# Patient Record
Sex: Male | Born: 1945
Health system: Southern US, Community
[De-identification: ages and names within clinical notes are randomized; demographics above are authoritative.]

## PROBLEM LIST (undated history)

## (undated) DIAGNOSIS — I82409 Acute embolism and thrombosis of unspecified deep veins of unspecified lower extremity: Secondary | ICD-10-CM

## (undated) DIAGNOSIS — I10 Essential (primary) hypertension: Secondary | ICD-10-CM

## (undated) DIAGNOSIS — E785 Hyperlipidemia, unspecified: Secondary | ICD-10-CM

## (undated) DIAGNOSIS — I509 Heart failure, unspecified: Secondary | ICD-10-CM

## (undated) DIAGNOSIS — J449 Chronic obstructive pulmonary disease, unspecified: Secondary | ICD-10-CM

## (undated) DIAGNOSIS — G4730 Sleep apnea, unspecified: Secondary | ICD-10-CM

## (undated) DIAGNOSIS — N401 Enlarged prostate with lower urinary tract symptoms: Secondary | ICD-10-CM

## (undated) DIAGNOSIS — K8020 Calculus of gallbladder without cholecystitis without obstruction: Secondary | ICD-10-CM

## (undated) DIAGNOSIS — R1011 Right upper quadrant pain: Secondary | ICD-10-CM

## (undated) DIAGNOSIS — M1990 Unspecified osteoarthritis, unspecified site: Secondary | ICD-10-CM

## (undated) DIAGNOSIS — I2699 Other pulmonary embolism without acute cor pulmonale: Secondary | ICD-10-CM

## (undated) DIAGNOSIS — E119 Type 2 diabetes mellitus without complications: Secondary | ICD-10-CM

## (undated) DIAGNOSIS — I517 Cardiomegaly: Secondary | ICD-10-CM

## (undated) DIAGNOSIS — R319 Hematuria, unspecified: Secondary | ICD-10-CM

## (undated) DIAGNOSIS — J45909 Unspecified asthma, uncomplicated: Secondary | ICD-10-CM

## (undated) HISTORY — DX: Calculus of gallbladder without cholecystitis without obstruction: K80.20

## (undated) HISTORY — DX: Right upper quadrant pain: R10.11

## (undated) HISTORY — DX: Essential (primary) hypertension: I10

## (undated) HISTORY — DX: Cardiomegaly: I51.7

## (undated) HISTORY — DX: Benign prostatic hyperplasia with lower urinary tract symptoms: N40.1

## (undated) HISTORY — DX: Chronic obstructive pulmonary disease, unspecified: J44.9

## (undated) HISTORY — PX: COLONOSCOPY: SHX174

## (undated) HISTORY — DX: Hyperlipidemia, unspecified: E78.5

## (undated) HISTORY — DX: Unspecified asthma, uncomplicated: J45.909

## (undated) HISTORY — DX: Sleep apnea, unspecified: G47.30

## (undated) HISTORY — DX: Unspecified osteoarthritis, unspecified site: M19.90

## (undated) HISTORY — DX: Hematuria, unspecified: R31.9

## (undated) HISTORY — PX: CORONARY ANGIOPLASTY WITH STENT PLACEMENT: SHX49

## (undated) HISTORY — PX: ANGIOPLASTY: SHX39

## (undated) HISTORY — PX: POLYPECTOMY: SHX149

## (undated) HISTORY — DX: Type 2 diabetes mellitus without complications: E11.9

---

## 1998-02-17 ENCOUNTER — Ambulatory Visit (HOSPITAL_COMMUNITY): Admission: RE | Admit: 1998-02-17 | Discharge: 1998-02-17 | Payer: Self-pay | Admitting: Internal Medicine

## 1998-03-17 ENCOUNTER — Encounter: Payer: Self-pay | Admitting: Internal Medicine

## 1998-03-17 ENCOUNTER — Ambulatory Visit: Admission: RE | Admit: 1998-03-17 | Discharge: 1998-03-17 | Payer: Self-pay | Admitting: Internal Medicine

## 1998-06-17 ENCOUNTER — Emergency Department (HOSPITAL_COMMUNITY): Admission: EM | Admit: 1998-06-17 | Discharge: 1998-06-17 | Payer: Self-pay | Admitting: Emergency Medicine

## 1999-05-22 ENCOUNTER — Encounter: Payer: Self-pay | Admitting: Emergency Medicine

## 1999-05-22 ENCOUNTER — Emergency Department (HOSPITAL_COMMUNITY): Admission: EM | Admit: 1999-05-22 | Discharge: 1999-05-22 | Payer: Self-pay | Admitting: Emergency Medicine

## 1999-11-25 ENCOUNTER — Encounter: Admission: RE | Admit: 1999-11-25 | Discharge: 1999-11-25 | Payer: Self-pay | Admitting: Internal Medicine

## 1999-11-25 ENCOUNTER — Encounter: Payer: Self-pay | Admitting: Internal Medicine

## 1999-12-04 ENCOUNTER — Encounter: Payer: Self-pay | Admitting: Emergency Medicine

## 1999-12-04 ENCOUNTER — Encounter: Payer: Self-pay | Admitting: Cardiovascular Disease

## 1999-12-04 ENCOUNTER — Emergency Department (HOSPITAL_COMMUNITY): Admission: EM | Admit: 1999-12-04 | Discharge: 1999-12-04 | Payer: Self-pay | Admitting: Emergency Medicine

## 2000-09-27 ENCOUNTER — Encounter: Payer: Self-pay | Admitting: Internal Medicine

## 2000-09-27 ENCOUNTER — Ambulatory Visit (HOSPITAL_COMMUNITY): Admission: RE | Admit: 2000-09-27 | Discharge: 2000-09-27 | Payer: Self-pay | Admitting: Internal Medicine

## 2001-07-11 ENCOUNTER — Encounter (INDEPENDENT_AMBULATORY_CARE_PROVIDER_SITE_OTHER): Payer: Self-pay | Admitting: Specialist

## 2001-07-11 ENCOUNTER — Ambulatory Visit (HOSPITAL_COMMUNITY): Admission: RE | Admit: 2001-07-11 | Discharge: 2001-07-11 | Payer: Self-pay | Admitting: Gastroenterology

## 2001-09-19 ENCOUNTER — Encounter: Admission: RE | Admit: 2001-09-19 | Discharge: 2001-09-19 | Payer: Self-pay | Admitting: Internal Medicine

## 2001-09-19 ENCOUNTER — Encounter: Payer: Self-pay | Admitting: Internal Medicine

## 2002-08-20 ENCOUNTER — Encounter: Admission: RE | Admit: 2002-08-20 | Discharge: 2002-08-20 | Payer: Self-pay | Admitting: Internal Medicine

## 2002-08-20 ENCOUNTER — Encounter: Payer: Self-pay | Admitting: Internal Medicine

## 2002-11-26 ENCOUNTER — Encounter: Admission: RE | Admit: 2002-11-26 | Discharge: 2003-02-24 | Payer: Self-pay | Admitting: Internal Medicine

## 2003-03-07 ENCOUNTER — Emergency Department (HOSPITAL_COMMUNITY): Admission: EM | Admit: 2003-03-07 | Discharge: 2003-03-07 | Payer: Self-pay | Admitting: Emergency Medicine

## 2003-03-29 ENCOUNTER — Ambulatory Visit (HOSPITAL_COMMUNITY): Admission: RE | Admit: 2003-03-29 | Discharge: 2003-03-29 | Payer: Self-pay | Admitting: Internal Medicine

## 2003-04-30 ENCOUNTER — Encounter: Admission: RE | Admit: 2003-04-30 | Discharge: 2003-07-29 | Payer: Self-pay | Admitting: Internal Medicine

## 2003-08-13 ENCOUNTER — Encounter: Admission: RE | Admit: 2003-08-13 | Discharge: 2003-08-13 | Payer: Self-pay | Admitting: Internal Medicine

## 2004-05-04 ENCOUNTER — Encounter: Admission: RE | Admit: 2004-05-04 | Discharge: 2004-05-04 | Payer: Self-pay | Admitting: Internal Medicine

## 2004-08-05 ENCOUNTER — Ambulatory Visit: Payer: Self-pay | Admitting: Gastroenterology

## 2004-12-25 ENCOUNTER — Ambulatory Visit (HOSPITAL_COMMUNITY): Admission: RE | Admit: 2004-12-25 | Discharge: 2004-12-25 | Payer: Self-pay | Admitting: Cardiology

## 2006-01-20 ENCOUNTER — Encounter: Admission: RE | Admit: 2006-01-20 | Discharge: 2006-01-20 | Payer: Self-pay | Admitting: Internal Medicine

## 2006-01-27 ENCOUNTER — Ambulatory Visit (HOSPITAL_COMMUNITY): Admission: RE | Admit: 2006-01-27 | Discharge: 2006-01-27 | Payer: Self-pay | Admitting: Internal Medicine

## 2007-01-19 ENCOUNTER — Encounter: Admission: RE | Admit: 2007-01-19 | Discharge: 2007-01-19 | Payer: Self-pay | Admitting: Internal Medicine

## 2008-01-22 DIAGNOSIS — I10 Essential (primary) hypertension: Secondary | ICD-10-CM | POA: Insufficient documentation

## 2008-01-22 DIAGNOSIS — J309 Allergic rhinitis, unspecified: Secondary | ICD-10-CM | POA: Insufficient documentation

## 2008-01-22 DIAGNOSIS — G4733 Obstructive sleep apnea (adult) (pediatric): Secondary | ICD-10-CM | POA: Insufficient documentation

## 2008-01-23 ENCOUNTER — Ambulatory Visit: Payer: Self-pay | Admitting: Internal Medicine

## 2008-02-04 DIAGNOSIS — E119 Type 2 diabetes mellitus without complications: Secondary | ICD-10-CM | POA: Insufficient documentation

## 2008-02-04 DIAGNOSIS — G4730 Sleep apnea, unspecified: Secondary | ICD-10-CM | POA: Insufficient documentation

## 2008-06-10 ENCOUNTER — Encounter: Admission: RE | Admit: 2008-06-10 | Discharge: 2008-06-10 | Payer: Self-pay | Admitting: Internal Medicine

## 2008-12-21 ENCOUNTER — Encounter: Admission: RE | Admit: 2008-12-21 | Discharge: 2008-12-21 | Payer: Self-pay | Admitting: Sports Medicine

## 2009-05-13 ENCOUNTER — Inpatient Hospital Stay (HOSPITAL_BASED_OUTPATIENT_CLINIC_OR_DEPARTMENT_OTHER): Admission: RE | Admit: 2009-05-13 | Discharge: 2009-05-13 | Payer: Self-pay | Admitting: Cardiology

## 2009-07-02 ENCOUNTER — Encounter (INDEPENDENT_AMBULATORY_CARE_PROVIDER_SITE_OTHER): Payer: Self-pay | Admitting: *Deleted

## 2009-08-11 ENCOUNTER — Encounter (INDEPENDENT_AMBULATORY_CARE_PROVIDER_SITE_OTHER): Payer: Self-pay | Admitting: *Deleted

## 2009-08-19 ENCOUNTER — Encounter (INDEPENDENT_AMBULATORY_CARE_PROVIDER_SITE_OTHER): Payer: Self-pay | Admitting: *Deleted

## 2009-08-22 ENCOUNTER — Ambulatory Visit: Payer: Self-pay | Admitting: Gastroenterology

## 2009-08-22 ENCOUNTER — Encounter (INDEPENDENT_AMBULATORY_CARE_PROVIDER_SITE_OTHER): Payer: Self-pay | Admitting: *Deleted

## 2009-10-06 ENCOUNTER — Telehealth (INDEPENDENT_AMBULATORY_CARE_PROVIDER_SITE_OTHER): Payer: Self-pay | Admitting: *Deleted

## 2009-10-10 ENCOUNTER — Ambulatory Visit: Payer: Self-pay | Admitting: Gastroenterology

## 2009-10-14 ENCOUNTER — Encounter: Payer: Self-pay | Admitting: Gastroenterology

## 2010-02-24 ENCOUNTER — Encounter: Admission: RE | Admit: 2010-02-24 | Discharge: 2010-02-24 | Payer: Self-pay | Admitting: Internal Medicine

## 2010-06-12 ENCOUNTER — Ambulatory Visit (HOSPITAL_COMMUNITY): Admission: RE | Admit: 2010-06-12 | Discharge: 2010-06-12 | Payer: Self-pay | Admitting: Internal Medicine

## 2010-10-06 NOTE — Miscellaneous (Signed)
Summary: LECPREVISIT/PREP  Clinical Lists Changes  Medications: Added new medication of MOVIPREP 100 GM  SOLR (PEG-KCL-NACL-NASULF-NA ASC-C) As per prep instructions. - Signed Rx of MOVIPREP 100 GM  SOLR (PEG-KCL-NACL-NASULF-NA ASC-C) As per prep instructions.;  #1 x 0;  Signed;  Entered by: Wyona Almas RN;  Authorized by: Louis Meckel MD;  Method used: [facility] [address] [phone]*, [address], [place], [place], [place]  [address], Ph: [phone] or [phone], Fax: [phone] Observations: Added new observation of NKA: T (08/22/2009 16:20)    Prescriptions: MOVIPREP 100 GM  SOLR (PEG-KCL-NACL-NASULF-NA ASC-C) As per prep instructions.  #1 x 0   Entered by:   Wyona Almas RN   Authorized by:   Louis Meckel MD   Signed by:   Wyona Almas RN on 08/22/2009   Method used:   [facility]ronically to        [facility] [address] [phone]* (retail)       [address]       [place], [place]  [address]       Ph: [phone] or [phone]       Fax: [phone]   RxID:   [id number]

## 2010-10-06 NOTE — Letter (Signed)
Summary: Patient Notice- Polyp Results  [facility]  [address] [place], [place] [address]   Phone: [phone]  Fax: [phone]        October 14, 2009 MRN: [id number]    Matthew Ochoa [address] [place], [place]  [address]    Dear [name],  I am pleased to inform you that the colon polyp(s) removed during your recent colonoscopy was (were) found to be benign (no cancer detected) upon pathologic examination.  I recommend you have a repeat colonoscopy examination in 3_ years to look for recurrent polyps, as having colon polyps increases your risk for having recurrent polyps or even colon cancer in the future.  Should you develop new or worsening symptoms of abdominal pain, bowel habit changes or bleeding from the rectum or bowels, please schedule an evaluation with either your primary care physician or with me.  Additional information/recommendations:  __ No further action with gastroenterology is needed at this time. Please      follow-up with your primary care physician for your other healthcare      needs.  __ Please call [phone] to schedule a return visit to review your      situation.  __ Please keep your follow-up visit as already scheduled.  _x_ Continue treatment plan as outlined the day of your exam.  Please call us if you are having persistent problems or have questions about your condition that have not been fully answered at this time.  Sincerely,  Louis Meckel MD  This letter has been electronically signed by your physician.  Appended Document: Patient Notice- Polyp Results letter mailed 2.11.11

## 2010-10-06 NOTE — Letter (Signed)
Summary: Previsit letter  [facility]  [address] [place], [place] [address]   Phone: [phone]  Fax: [phone]       08/11/2009 MRN: [id number]  Matthew Ochoa [address] [place], [place]  [address]  Dear [name],  Welcome to the Gastroenterology Division at [facility].    You are scheduled to see a nurse for your pre-procedure visit on 08/21/2009 at 4:00PM on the 3rd floor at [facility], [address]  We ask that you try to arrive at our office 15 minutes prior to your appointment time to allow for check-in.  Your nurse visit will consist of discussing your medical and surgical history, your immediate family medical history, and your medications.    Please bring a complete list of all your medications or, if you prefer, bring the medication bottles and we will list them.  We will need to be aware of both prescribed and over the counter drugs.  We will need to know exact dosage information as well.  If you are on blood thinners (Coumadin, Plavix, Aggrenox, Ticlid, etc.) please call our office today/prior to your appointment, as we need to consult with your physician about holding your medication.   Please be prepared to read and sign documents such as consent forms, a financial agreement, and acknowledgement forms.  If necessary, and with your consent, a friend or relative is welcome to sit-in on the nurse visit with you.  Please bring your insurance card so that we may make a copy of it.  If your insurance requires a referral to see a specialist, please bring your referral form from your primary care physician.  No co-pay is required for this nurse visit.     If you cannot keep your appointment, please call [phone] to cancel or reschedule prior to your appointment date.  This allows [place] the opportunity to schedule an appointment for another patient in need of care.    Thank you for choosing [facility] for your medical  needs.  We appreciate the opportunity to care for you.  Please visit [place] at our website  to learn more about our practice.                     Sincerely.                                                                                                                   The Gastroenterology Division

## 2010-10-06 NOTE — Letter (Signed)
Summary: [facility] Instructions  [facility]  [address] [place], [place] [address]   Phone: [phone]  Fax: [phone]       Matthew Ochoa    [birth year]    MRN: [id number]        Procedure Day Dorna Bloom: Sheral Flow. 09/15/09     Arrival Time: 7:30am     Procedure Time: 8:30am     Location of Procedure:                    Juliann Pares  [facility] (4th Floor)                       PREPARATION FOR COLONOSCOPY WITH MOVIPREP   Starting 5 days prior to your procedure 09/10/09  do not eat nuts, seeds, popcorn, corn, beans, peas,  salads, or any raw vegetables.  Do not take any fiber supplements (e.g. Metamucil, Citrucel, and Benefiber).  THE DAY BEFORE YOUR PROCEDURE         DATE: 09/14/09   DAY: Sunday  1.  Drink clear liquids the entire day-NO SOLID FOOD  2.  Do not drink anything colored red or purple.  Avoid juices with pulp.  No orange juice.  3.  Drink at least 64 oz. (8 glasses) of fluid/clear liquids during the day to prevent dehydration and help the prep work efficiently.  CLEAR LIQUIDS INCLUDE: Water Jello Ice Popsicles Tea (sugar ok, no milk/cream) Powdered fruit flavored drinks Coffee (sugar ok, no milk/cream) Gatorade Juice: apple, white grape, white cranberry  Lemonade Clear bullion, consomm, broth Carbonated beverages (any kind) Strained chicken noodle soup Hard Candy                             4.  In the morning, mix first dose of MoviPrep solution:    Empty 1 Pouch A and 1 Pouch B into the disposable container    Add lukewarm drinking water to the top line of the container. Mix to dissolve    Refrigerate (mixed solution should be used within 24 hrs)  5.  Begin drinking the prep at 5:00 p.m. The MoviPrep container is divided by 4 marks.   Every 15 minutes drink the solution down to the next mark (approximately 8 oz) until the full liter is complete.   6.  Follow completed prep with 16 oz of clear liquid of your choice (Nothing red  or purple).  Continue to drink clear liquids until bedtime.  7.  Before going to bed, mix second dose of MoviPrep solution:    Empty 1 Pouch A and 1 Pouch B into the disposable container    Add lukewarm drinking water to the top line of the container. Mix to dissolve    Refrigerate  THE DAY OF YOUR PROCEDURE      DATE: 09/15/09  DAY: Monday  Beginning at 3:30 a.m. (5 hours before procedure):         1. Every 15 minutes, drink the solution down to the next mark (approx 8 oz) until the full liter is complete.  2. Follow completed prep with 16 oz. of clear liquid of your choice.    3. You may drink clear liquids until 6:30AM  (2 HOURS BEFORE PROCEDURE).   MEDICATION INSTRUCTIONS  Unless otherwise instructed, you should take regular prescription medications with a small sip of water   as early as possible the morning of  your procedure.  Diabetic patients - see separate instructions.   Additional medication instructions: Hold Micardis and Metformin the morning of procedure.  Take Atenolol and Felodipine the morning of procedure.        OTHER INSTRUCTIONS  You will need a responsible adult at least 64 years of age to accompany you and drive you home.   This person must remain in the waiting room during your procedure.  Wear loose fitting clothing that is easily removed.  Leave jewelry and other valuables at home.  However, you may wish to bring a book to read or  an iPod/MP3 player to listen to music as you wait for your procedure to start.  Remove all body piercing jewelry and leave at home.  Total time from sign-in until discharge is approximately 2-3 hours.  You should go home directly after your procedure and rest.  You can resume normal activities the  day after your procedure.  The day of your procedure you should not:   Drive   Make legal decisions   Operate machinery   Drink alcohol   Return to work  You will receive specific instructions about  eating, activities and medications before you leave.    The above instructions have been reviewed and explained to me by   Wyona Almas RN  December 64, 2010 4:51 PM     I fully understand and can verbalize these instructions _____________________________ Date _________

## 2010-10-06 NOTE — Procedures (Signed)
Summary: Colonoscopy  Patient: Matthew Ochoa Note: All result statuses are Final unless otherwise noted.  Tests: (1) Colonoscopy (COL)   COL Colonoscopy           DONE     [facility]     [address]     [place], [place]  [address]           COLONOSCOPY PROCEDURE REPORT           PATIENT:  Matthew Ochoa, Matthew Ochoa  MR#:  [id number]     BIRTHDATE:  1946/07/31, 63 yrs. old  GENDER:  male           ENDOSCOPIST:  Barbette Hair. Arlyce Dice, MD     Referred by:           PROCEDURE DATE:  10/10/2009     PROCEDURE:  Colonoscopy with snare polypectomy     ASA CLASS:  Class II     INDICATIONS:  history of pre-cancerous (adenomatous) colon polyps                 MEDICATIONS:   Fentanyl 50 mcg IV, Versed 5 mg IV           DESCRIPTION OF PROCEDURE:   After the risks benefits and     alternatives of the procedure were thoroughly explained, informed     consent was obtained.  Digital rectal exam was performed and     revealed no abnormalities.   The LB CF-[id number] endoscope     was introduced through the anus and advanced to the cecum, which     was identified by the ileocecal valve, limited by poor     preparation.  Moderate amount of retained, thick liquid stool  The     quality of the prep was Moviprep fair.  The instrument was then     slowly withdrawn as the colon was fully examined.     <<PROCEDUREIMAGES>>           FINDINGS:  A sessile polyp was found in the distal transverse     colon. It was 1 cm in size. Polyp was snared, then cauterized with     monopolar cautery. Retrieval was successful (see image10). snare     polyp  A sessile polyp was found at the splenic flexure. It was 5     mm in size. Polyp was snared without cautery. Retrieval was     successful (see image12). snare polyp  A sessile polyp was found     in the sigmoid colon. It was 4 mm in size. It was found 30 cm from     the point of entry. Polyp was snared without cautery. Retrieval     was successful (see  image16). snare polyp  Mild diverticulosis was     found transverse to sigmoid Scattered diverticula  This was     otherwise a normal examination of the colon (see image2, image3,     image5, image7, image17, image18, and image19).   Retroflexed     views in the rectum revealed no abnormalities.    The scope was     then withdrawn from the patient and the procedure completed.           COMPLICATIONS:  None           ENDOSCOPIC IMPRESSION:     1) 1 cm sessile polyp in the distal transverse colon     2) 5 mm sessile polyp at the splenic  flexure     3) 4 mm sessile polyp in the sigmoid colon     4) Mild diverticulosis in the transverse to sigmoid     5) Otherwise normal examination     RECOMMENDATIONS:     1) colonoscopy in 3 years (because of limitations from prep,     small polyp may have not been seen)           REPEAT EXAM:  In 3 year(s) for Colonoscopy.           ______________________________     Barbette Hair. Arlyce Dice, MD           CC:  Willey Blade, MD           n.     Rosalie Doctor:   Barbette Hair. Kaplan at 10/10/2009 09:47 AM           Page 2 of 3   Torrell, Krutz, [id number]  Note: An exclamation mark (!) indicates a result that was not dispersed into the flowsheet. Document Creation Date: 10/10/2009 9:47 AM _______________________________________________________________________  (1) Order result status: Final Collection or observation date-time: 10/10/2009 09:31 Requested date-time:  Receipt date-time:  Reported date-time:  Referring Physician:   Ordering Physician: Melvia Heaps [phone]) Specimen Source:  Source: Launa Grill Order Number: [id number] Lab site:   Appended Document: Colonoscopy f/u     Procedures Next Due Date:    Colonoscopy: 10/2012

## 2010-10-06 NOTE — Progress Notes (Signed)
Summary: prep ?  Phone Note Call from Patient Call back at [phone]  (cell)   Caller: Patient Call For: Dr. Arlyce Dice Reason for Call: Talk to Nurse Summary of Call: pt has questions regarding his diet before procedure this Friday Initial call taken by: Vallarie Mare,  October 06, 2009 10:56 AM  Follow-up for Phone Call        pt's questions were answered Follow-up by: Ezra Sites RN,  October 06, 2009 11:44 AM

## 2010-10-06 NOTE — Letter (Signed)
Summary: Diabetic Instructions  [facility]  [address] [place], [place] [address]   Phone: [phone]  Fax: [phone]    LAITHAN CONCHAS 04-May-1946 MRN: [id number]   _x  _   ORAL DIABETIC MEDICATION INSTRUCTIONS  The day before your procedure:   Take your diabetic pill as you do normally  The day of your procedure:   Do not take your diabetic pill    We will check your blood sugar levels during the admission process and again in Recovery before discharging you home  ________________________________________________________________________  _  _   INSULIN (LONG ACTING) MEDICATION INSTRUCTIONS (Lantus, NPH, 70/30, Humulin, Novolin-N)   The day before your procedure:   Take  your regular evening dose    The day of your procedure:   Do not take your morning dose    _  _   INSULIN (SHORT ACTING) MEDICATION INSTRUCTIONS (Regular, Humulog, Novolog)   The day before your procedure:   Do not take your evening dose   The day of your procedure:   Do not take your morning dose   _  _   INSULIN PUMP MEDICATION INSTRUCTIONS  We will contact the physician managing your diabetic care for written dosage instructions for the day before your procedure and the day of your procedure.  Once we have received the instructions, we will contact you.

## 2010-10-06 NOTE — Assessment & Plan Note (Signed)
Summary: sleep apnea-self referral////kp   Visit Type:  Initial Consult PCP:  Violeta Gelinas  Chief Complaint:  Sleep Medicine consult.  History of Present Illness: Current Problems:  ALLERGIC RHINITIS (ICD-477.9) OBSTRUCTIVE SLEEP APNEA (ICD-327.23) HYPERTENSION (ICD-401.9)  This is a 65 year old man referred in sleep medicine consultation at the kind request of Dr. [name] because of sleep apnea.  He had a polysomnogram on 03/17/98 with an AHI of 32/hr.  He had been using CPAP at [address], but couldn't get comfortable with the mask.  He stopped CPAP.  More recently, he had been trying it again.  His wife complains of his snoring.  He admits to some daytime sleepiness, and has to get up and move around, occasionally to stay alert.  He asks about alternative therapies. Bedtime is between 10 and 11 p.m., estimating sleep latency of 15 minutes.  He wakes up once or twice each night after sleep onset before getting up at 5:30 a.m.  He has gained 20 pounds in the last two years.         Current Allergies (reviewed today): No known allergies   Past Medical History:    Reviewed history and no changes required:       Sleep Apnea- NPSG 03/17/98, cpap 14       Diabetes, Type 2       Hypertension   Family History:    Reviewed history and no changes required:       Sister- asthma       Father- died lung cancer  Social History:    Reviewed history and no changes required:       Patient is a current smoker.    Risk Factors:  Tobacco use:  current   Review of Systems      See HPI       snoring, witnessed apneas, daytime sleepiness, nonrestorative sleep.   Vital Signs:  Patient Profile:   65 Years Old Male Weight:      282.13 pounds O2 Sat:      98 % O2 treatment:    Room Air Pulse rate:   55 / minute BP sitting:   142 / 78  (left arm) Cuff size:   regular  Vitals Entered By: Reynaldo Minium CMA (Jan 23, 2008 4:30 PM)             Comments Medications reviewed with patient  Reynaldo Minium CMA  Jan 23, 2008 4:30 PM      Physical Exam  General:     obese. alert and cooperative  Ears:     TMs intact and clear with normal canals Nose:     no deformity, discharge, inflammation, or lesions Mouth:     Melampatti Class III.   voice normal, with no stridor Chest Wall:     pectus excavatum.   Lungs:     clear bilaterally to auscultation and percussion Heart:     regular rhythm, normal rate, and no murmurs.   Extremities:     no clubbing, cyanosis, edema, or deformity noted Neurologic:     grossly normal to observation     Impression & Recommendations:  Problem # 1:  OBSTRUCTIVE SLEEP APNEA (ICD-327.23) I don't believe he is going to be comfortable with CPAP long-term.  We can work on desensitization and we can restudy him if necessary.  For now I would like to give him information about alternative therapies.  We will get him a dental referral for an oral appliance, and  ENT referral to learn about surgery. Christoper Allegra is being asked to work with him again on mask fit and comfort. Orders:  Dental Referral (Dentist) DME Referral (DME) ENT Referral (ENT)   Medications Added to Medication List This Visit: 1)  Actoplus Met 15-500 Mg Tabs (Pioglitazone hcl-metformin hcl) .... Take 1 by mouth once daily 2)  Felodipine 5 Mg Tb24 (Felodipine) .... Take 1 by mouth once daily 3)  Micardis Hct 80-12.5 Mg Tabs (Telmisartan-hctz) .... Take 1 by mouth once daily 4)  Adult Aspirin Low Strength 81 Mg Tbdp (Aspirin) .... Take 1 by mouth once daily 5)  Cpap  .... Unsure of setting apria   Patient Instructions: 1)  Please schedule a follow-up appointment as needed 2)  [facility] will give ENT apt and contact information for dental faculty  about oral appliances   ]

## 2010-11-26 LAB — GLUCOSE, CAPILLARY
Glucose-Capillary: 102 mg/dL — ABNORMAL HIGH (ref 70–99)
Glucose-Capillary: 99 mg/dL (ref 70–99)

## 2010-12-11 LAB — POCT I-STAT GLUCOSE
Glucose, Bld: 102 mg/dL — ABNORMAL HIGH (ref 70–99)
Operator id: 133881

## 2010-12-11 LAB — CBC
HCT: 39.7 % (ref 39.0–52.0)
Hemoglobin: 13.6 g/dL (ref 13.0–17.0)
MCHC: 34.2 g/dL (ref 30.0–36.0)
MCV: 86.7 fL (ref 78.0–100.0)
Platelets: 152 10*3/uL (ref 150–400)
RBC: 4.58 MIL/uL (ref 4.22–5.81)
RDW: 16.4 % — ABNORMAL HIGH (ref 11.5–15.5)
WBC: 6.7 10*3/uL (ref 4.0–10.5)

## 2011-01-03 ENCOUNTER — Inpatient Hospital Stay (INDEPENDENT_AMBULATORY_CARE_PROVIDER_SITE_OTHER)
Admission: RE | Admit: 2011-01-03 | Discharge: 2011-01-03 | Disposition: A | Discharge status: Home / Self Care | Payer: 59 | Source: Ambulatory Visit | Attending: Family Medicine | Admitting: Family Medicine

## 2011-01-03 DIAGNOSIS — S335XXA Sprain of ligaments of lumbar spine, initial encounter: Secondary | ICD-10-CM

## 2011-01-03 LAB — POCT URINALYSIS DIP (DEVICE)
Bilirubin Urine: NEGATIVE
Glucose, UA: NEGATIVE mg/dL
Hgb urine dipstick: NEGATIVE
Ketones, ur: NEGATIVE mg/dL
Nitrite: NEGATIVE
Protein, ur: NEGATIVE mg/dL
Specific Gravity, Urine: 1.01 (ref 1.005–1.030)
Urobilinogen, UA: 1 mg/dL (ref 0.0–1.0)
pH: 6 (ref 5.0–8.0)

## 2011-04-13 ENCOUNTER — Inpatient Hospital Stay (HOSPITAL_COMMUNITY)
Admission: AD | Admit: 2011-04-13 | Discharge: 2011-04-14 | DRG: 247 | Disposition: A | Discharge status: Home / Self Care | Payer: 59 | Source: Ambulatory Visit | Attending: Cardiology | Admitting: Cardiology

## 2011-04-13 ENCOUNTER — Inpatient Hospital Stay (HOSPITAL_BASED_OUTPATIENT_CLINIC_OR_DEPARTMENT_OTHER)
Admission: RE | Admit: 2011-04-13 | Discharge: 2011-04-13 | Disposition: A | Discharge status: Home / Self Care | Payer: 59 | Source: Ambulatory Visit | Attending: Cardiology | Admitting: Cardiology

## 2011-04-13 DIAGNOSIS — R0989 Other specified symptoms and signs involving the circulatory and respiratory systems: Secondary | ICD-10-CM | POA: Insufficient documentation

## 2011-04-13 DIAGNOSIS — E119 Type 2 diabetes mellitus without complications: Secondary | ICD-10-CM | POA: Insufficient documentation

## 2011-04-13 DIAGNOSIS — E7800 Pure hypercholesterolemia, unspecified: Secondary | ICD-10-CM | POA: Diagnosis present

## 2011-04-13 DIAGNOSIS — F17200 Nicotine dependence, unspecified, uncomplicated: Secondary | ICD-10-CM | POA: Diagnosis present

## 2011-04-13 DIAGNOSIS — I10 Essential (primary) hypertension: Secondary | ICD-10-CM | POA: Diagnosis present

## 2011-04-13 DIAGNOSIS — R0609 Other forms of dyspnea: Secondary | ICD-10-CM | POA: Insufficient documentation

## 2011-04-13 DIAGNOSIS — I2510 Atherosclerotic heart disease of native coronary artery without angina pectoris: Principal | ICD-10-CM | POA: Diagnosis present

## 2011-04-13 DIAGNOSIS — R42 Dizziness and giddiness: Secondary | ICD-10-CM | POA: Insufficient documentation

## 2011-04-13 LAB — GLUCOSE, CAPILLARY
Glucose-Capillary: 119 mg/dL — ABNORMAL HIGH (ref 70–99)
Glucose-Capillary: 98 mg/dL (ref 70–99)
Glucose-Capillary: 153 mg/dL — ABNORMAL HIGH (ref 70–99)
Glucose-Capillary: 152 mg/dL — ABNORMAL HIGH (ref 70–99)

## 2011-04-13 LAB — POCT I-STAT GLUCOSE
Glucose, Bld: 135 mg/dL — ABNORMAL HIGH (ref 70–99)
Operator id: 141321

## 2011-04-13 LAB — POCT ACTIVATED CLOTTING TIME: Activated Clotting Time: 403 seconds

## 2011-04-13 LAB — PLATELET INHIBITION P2Y12: Platelet Function  P2Y12: 10 [PRU] — ABNORMAL LOW (ref 194–418)

## 2011-04-14 LAB — BASIC METABOLIC PANEL
BUN: 11 mg/dL (ref 6–23)
CO2: 25 mEq/L (ref 19–32)
Calcium: 8.8 mg/dL (ref 8.4–10.5)
Chloride: 106 mEq/L (ref 96–112)
Creatinine, Ser: 0.85 mg/dL (ref 0.50–1.35)
GFR calc Af Amer: >60 mL/min (ref >60)
GFR calc non Af Amer: >60 mL/min (ref >60)
Glucose, Bld: 117 mg/dL — ABNORMAL HIGH (ref 70–99)
Potassium: 3.8 mEq/L (ref 3.5–5.1)
Sodium: 138 mEq/L (ref 135–145)

## 2011-04-14 LAB — CBC
HCT: 36.4 % — ABNORMAL LOW (ref 39.0–52.0)
Hemoglobin: 13.3 g/dL (ref 13.0–17.0)
MCH: 28.2 pg (ref 26.0–34.0)
MCHC: 36.5 g/dL — ABNORMAL HIGH (ref 30.0–36.0)
MCV: 77.1 fL — ABNORMAL LOW (ref 78.0–100.0)
Platelets: 144 10*3/uL — ABNORMAL LOW (ref 150–400)
RBC: 4.72 MIL/uL (ref 4.22–5.81)
RDW: 15.9 % — ABNORMAL HIGH (ref 11.5–15.5)
WBC: 8.5 10*3/uL (ref 4.0–10.5)

## 2011-04-14 LAB — CK TOTAL AND CKMB (NOT AT ARMC)
CK, MB: 2.6 ng/mL (ref 0.3–4.0)
Relative Index: 2.4 (ref 0.0–2.5)
Total CK: 109 U/L (ref 7–232)

## 2011-04-14 LAB — GLUCOSE, CAPILLARY
Glucose-Capillary: 159 mg/dL — ABNORMAL HIGH (ref 70–99)
Glucose-Capillary: 137 mg/dL — ABNORMAL HIGH (ref 70–99)

## 2011-04-22 NOTE — Cardiovascular Report (Signed)
NAMETARIG, ZIMMERS               ACCOUNT NO.:  [id number]  MEDICAL RECORD NO.:  [id number]  LOCATION:  6533                         FACILITY:  [facility]  PHYSICIAN:  Eduardo Osier. Sharyn Lull, M.D. DATE OF BIRTH:  [birth year]  DATE OF PROCEDURE:  04/13/2011 DATE OF DISCHARGE:                           CARDIAC CATHETERIZATION   PROCEDURE:  Left cardiac cath with selective left and right coronary angiography, LV graft via right groin using Judkins technique.  INDICATIONS FOR PROCEDURE:  Mr. Stettler is a 65 year old black male with past medical history significant for hypertension, non-insulin-dependent diabetes mellitus, hypercholesteremia, history of atrial tachy arrhythmias, morbid obesity who complains of exertional dyspnea with minimal exertion associated with feeling weak and dizzy.  States he felt very weak and diaphoretic while playing golf.  Denies any chest pain, nausea, vomiting, states lately he gets tired and short of breath easily.  EKG done in the office showed normal sinus rhythm with ST depression in anterolateral leads.  Denies any palpitation, lightheadedness or syncope.  Denies PND, orthopnea or leg swelling. Denies fever, chills or cough.  Due to exertional dyspnea, feeling weak. Multiple risk factors and EKG changes discussed with the patient regarding noninvasive stress testing versus left cath, possible PTCA stenting, its risks and benefits i.e. death, stroke, need for emergency CABG, local vascular complications etc., and consented for PCI.  PROCEDURE:  After obtaining the informed consent, the patient was brought to the cath lab and was placed on fluoroscopy table.  Right groin was prepped and draped in usual fashion.  Xylocaine 2% was used for local anesthesia in the right groin.  With the help of thin-wall needle, 4-French arterial sheath was placed.  The sheath was aspirated and flushed.  Next, 4-French left Judkins catheter was advanced over the wire under  fluoroscopic guidance up to the ascending aorta.  Wire was pulled out.  The catheter was aspirated and connected to the manifold. Catheter was further advanced and engaged into left coronary ostium. Multiple views of the left system were taken.  Next, the catheter was disengaged and was pulled out over the wire and was replaced with 4- [place] 3-D right diagnostic catheter which was advanced over the wire under fluoroscopic guidance up to the ascending aorta.  Wire was pulled out.  The catheter was aspirated and connected to the manifold. Catheter was further advanced and engaged into right coronary ostium. Multiple views of the right system were taken.  Next, the catheter was disengaged and was pulled out over the wire and was replaced with 4- [place] pigtail catheter which was advanced over the wire under fluoroscopic guidance up to the ascending aorta.  Wire was pulled out. The catheter was aspirated and connected to the manifold.  Catheter was further advanced across the aortic valve into the LV.  LV pressures were recorded.  Next, LV graft was done in 30 degrees RAO position.  Post angiographic pressures were recorded from LV and then pullback pressures were recorded from the aorta.  There was no significant gradient across the aortic valve.  Next, pigtail catheter was pulled out over the wire. Sheaths were aspirated and flushed.  FINDINGS:  LV showed LV was mildly enlarged with inferior wall  hypokinesia, EF of 50-55%.  Left main was patent.  LAD has 20-25% ostial and 20-25% proximal and mid stenosis.  Diagonal one is small which is patent.  Left circumflex has 15-20% ostial and mid stenosis and then it tapers down in AV groove.  OM-1 is very, very small.  OM-2 is very small which is patent.  Left PLV branch is large which has 30-40% proximal stenosis.  RCA has 15-20% proximal and mid stenosis and 95% distal stenosis and poststenotic dilatation just prior to PDA.  PDA is very small  which is diffusely diseased and has mid 80-85% stenosis.  PLV branches were also very small which were diffusely diseased.  The patient tolerated the procedure well.  The patient will be transferred. The patient will be admitted and will undergo PCI to distal RCA and PDA today.     Eduardo Osier. Sharyn Lull, M.D.     [facility]/MEDQ  D:  04/13/2011  T:  04/13/2011  Job:  [id number]  Electronically Signed by Rinaldo Cloud M.D. on 04/22/2011 08:14:50 PM

## 2011-04-22 NOTE — Discharge Summary (Signed)
NAMEGRAYLAND, Matthew Ochoa               ACCOUNT NO.:  [id number]  MEDICAL RECORD NO.:  [id number]  LOCATION:  6533                         FACILITY:  [facility]  PHYSICIAN:  Eduardo Osier. Sharyn Lull, M.D. DATE OF BIRTH:  1946-02-03  DATE OF ADMISSION:  04/13/2011 DATE OF DISCHARGE:  04/14/2011                              DISCHARGE SUMMARY   ADMITTING DIAGNOSES:  Exertional dyspnea, weakness with abnormal EKG, rule out coronary insufficiency, hypertension, non-insulin-dependent diabetes mellitus, hypercholesteremia, morbid obesity and history of atrial tachy arrhythmias.  FINAL DIAGNOSES:  Exertional dyspnea, weakness, status post left cath and percutaneous transluminal coronary angioplasty stenting to distal right coronary artery and percutaneous transluminal coronary angioplasty to very small patent ductus arteriosus, hypertension, non-insulin- dependent diabetes mellitus, hypercholesteremia, morbid obesity and history of atrial tachyarrhythmias.  DISCHARGE HOME MEDICATIONS: 1. Enteric-coated aspirin 325 mg 1 tablet daily. 2. Effient 10 mg 1 tablet daily. 3. Pepcid 20 mg 1 tablet twice daily. 4. Nitrostat 0.4 mg sublingual use as directed. 5. Crestor 10 mg 1 tablet daily. 6. Toprol-XL 25 mg 1 tablet daily. 7. Felodipine 5 mg 1 tablet daily. 8. Metformin 500 mg 1 tablet twice daily as before.  The patient has     been advised to stop metformin for 48 hours, start after 48 hours. 9. Micardis/HCT 80/12.5 one tablet daily.  DIET:  Low salt, low cholesterol 1800 calories ADA diet.  The patient has been advised to monitor blood pressure and blood sugar daily.  Post PTCA stent instructions have been given.  FOLLOWUP:  With me in 1 week.  CONDITION AT DISCHARGE:  Stable.  BRIEF HISTORY AND HOSPITAL COURSE:  Matthew Ochoa is a 65 year old black male with past medical history significant for hypertension, diabetes mellitus, hypercholesteremia, history of atrial tachyarrhythmias, morbid obesity  complains of exertional dyspnea with minimal exertion associated with feeling weak, dizzy, states he felt very weak and diaphoretic while playing golf.  Denies chest pain, nausea, vomiting or diaphoresis.  He states lately he gets tired and short of breath easily.  EKG done in the office showed normal sinus rhythm with ST depression in anterolateral leads.  Denies any palpitation, lightheadedness or syncope.  Denies PND, orthopnea and leg swelling.  Denies fever, chills and cough.  PAST MEDICAL HISTORY:  As above.  PAST SURGICAL HISTORY:  None.  ALLERGIES:  None.  MEDICATION AT HOME:  He is on aspirin, Plavix, Micardis, atenolol, metformin, vitamin D and omega-3 fatty acid.  SOCIAL HISTORY:  He is married, 2 children.  He smoked one-pack per day for 25 years, quit 5+ years ago.  No history of alcohol abuse.  FAMILY HISTORY:  Father died of CA of lung at age of 19.  Mother is alive.  She is 82.  She has COPD.  One sister had breast CA.  PHYSICAL EXAMINATION:  GENERAL:  He was alert, awake and oriented x3, in no acute distress.VITAL SIGNS:  Blood pressure was 140/84, pulse was 74 and regular. EYES:  Conjunctivae was pink. NECK:  Supple, no JVD, no bruit. LUNGS:  Were clear to auscultation without rhonchi or rales. CARDIOVASCULAR:  S1 and S2 was normal.  There was soft systolic murmur. ABDOMEN:  Soft, obese, bowel sounds  present and nontender. EXTREMITIES:  There is no clubbing, cyanosis or edema.  LABORATORY DATA POSTPROCEDURE:  Her CK is 109, MB 2.6.  Her BRU platelet inhibition level is 10, hemoglobin is 13.3, hematocrit 36.4, white count of 8.5, potassium is 3.8, glucose 117, BUN 11 and creatinine 0.85.  EKG showed normal sinus rhythm with marked sinus arrhythmias and PACs.  He has T-wave inversion in anterolateral and inferior leads.  BRIEF HOSPITAL COURSE:  The patient was a.m. admit and underwent outpatient cardiac cath with selective left and right coronary angiography  and was noted to have critical distal RCA and PDA stenosis requiring admission to the hospital and PTCA stenting to distal RCA and PTCA to PDA.  The patient tolerated the procedure well.  There are no complications postprocedure.  The patient did not have any episodes of chest pain during the hospital stay.  His groin is stable with no evidence of hematoma or bruit.  The patient has been ambulating in hallway without any problems and is eager to go home.  The patient will be discharged home on above medications and will be followed up in my office in 1 week.     Eduardo Osier. Sharyn Lull, M.D.     [facility]/MED[facility]  D:  04/14/2011  T:  04/15/2011  Job:  [id number]  Electronically Signed by Rinaldo Cloud M.D. on 04/22/2011 08:14:44 PM

## 2011-04-22 NOTE — Cardiovascular Report (Signed)
NAMEDARIUSH, MCNELLIS               ACCOUNT NO.:  [id number]  MEDICAL RECORD NO.:  [id number]  LOCATION:  6533                         FACILITY:  [facility]  PHYSICIAN:  Eduardo Osier. Sharyn Lull, M.D. DATE OF BIRTH:  February 14, 1946  DATE OF PROCEDURE:  04/13/2011 DATE OF DISCHARGE:                           CARDIAC CATHETERIZATION   PROCEDURE: 1. Successful percutaneous transluminal coronary angioplasty to distal     right coronary artery using 2.0 x 12-mm long Mini Trek balloon. 2. Successful percutaneous transluminal coronary angioplasty to     posterior descending artery using initially 1.5 x 15-mm long     Sprinter balloon and then 2.0 x 15-mm long Sprinter balloon. 3. Successful deployment of 3.0 x 32-mm long thrombosed drug-eluting     stent in distal right coronary artery. 4. Successful postdilatation of this stent using 3.25 x 15-mm long [place]     Sprinter balloon.  INDICATIONS FOR PROCEDURE:  Mr. Fox is a 65 year old black male with past medical history significant for hypertension, diabetes mellitus, hypercholesteremia, history of tracheal arrhythmias, morbid obesity, complains of exertional dyspnea with minimal exertion associated with feeling weak and dizzy while playing golf.  States feelings of lately tired and weak and diaphoretic with exertion.  EKG done in the office showed normal sinus rhythm with ST depression in anterolateral leads. The patient subsequently underwent left cardiac cath as an outpatient today which showed critical distal RCA and PDA stenosis.  The patient is transferred to [facility] Cath Lab for PCI to RCA.  After obtaining the informed consent, the patient was brought to the cath lab and was placed on fluoroscopy table.  A 4-French arterial sheath was exchanged to 6-French arterial sheath without difficulty.  Next, a 6- [place] IM guiding catheter was advanced over the wire under fluoroscopic guidance up to the ascending aorta.  Wire was pulled  out, the catheter was aspirated and connected to the manifold.  Catheter was further advanced and engaged into right coronary ostium.  Multiple views of the right system were taken.  Findings were same as before.  The patient had critical 90-95% stenosis in the distal RCA and 80-85% stenosis in mid PDA.  INTERVENTIONAL PROCEDURE:  Successful PTCA to distal RCA was done using 2.0 x 12-mm long Mini Trek balloon for predilatation and then PTCA to PDA was done using initially 1.5 x 15-mm long Sprinter balloon, and then 2.0 x 15-mm long Sprinter balloon.  Lesion was dilated in PDA from 80- 85% to less than 20% residual with excellent TIMI grade 3 distal flow without evidence of dissection or distal embolization, and then 3.0 x 32- mm long PROMUS Element drug-eluting stent was deployed in distal RCA at 11 atmospheric pressure.  Stent was postdilated using 3.25 x 15-mm long [place] Sprinter balloon going up to 18 atmospheric pressure.  Lesion was dilated from 95% with aneurysmal dilatation to 0% residual with excellent TIMI grade 3 distal flow without evidence of dissection or distal embolization.  The patient received weight-based Angiomax and 60 mg of Effient prior to the procedure.  The patient tolerated the procedure well.  There were no complications.  The patient was transferred to recovery room in stable condition.  Eduardo Osier. Sharyn Lull, M.D.     [facility]/MEDQ  D:  04/13/2011  T:  04/13/2011  Job:  [id number]  Electronically Signed by Rinaldo Cloud M.D. on 04/22/2011 08:14:55 PM

## 2011-05-24 ENCOUNTER — Other Ambulatory Visit: Payer: Self-pay | Admitting: Cardiology

## 2011-05-24 ENCOUNTER — Encounter (HOSPITAL_COMMUNITY)
Admission: RE | Admit: 2011-05-24 | Discharge: 2011-05-24 | Disposition: A | Discharge status: Home / Self Care | Payer: 59 | Source: Ambulatory Visit | Attending: Cardiology | Admitting: Cardiology

## 2011-05-24 DIAGNOSIS — E119 Type 2 diabetes mellitus without complications: Secondary | ICD-10-CM | POA: Insufficient documentation

## 2011-05-24 DIAGNOSIS — E7800 Pure hypercholesterolemia, unspecified: Secondary | ICD-10-CM | POA: Insufficient documentation

## 2011-05-24 DIAGNOSIS — I10 Essential (primary) hypertension: Secondary | ICD-10-CM | POA: Insufficient documentation

## 2011-05-24 DIAGNOSIS — Z5189 Encounter for other specified aftercare: Secondary | ICD-10-CM | POA: Insufficient documentation

## 2011-05-24 DIAGNOSIS — Z9861 Coronary angioplasty status: Secondary | ICD-10-CM | POA: Insufficient documentation

## 2011-05-24 DIAGNOSIS — F17200 Nicotine dependence, unspecified, uncomplicated: Secondary | ICD-10-CM | POA: Insufficient documentation

## 2011-05-24 DIAGNOSIS — I2510 Atherosclerotic heart disease of native coronary artery without angina pectoris: Secondary | ICD-10-CM | POA: Insufficient documentation

## 2011-05-24 LAB — GLUCOSE, CAPILLARY
Glucose-Capillary: 199 mg/dL — ABNORMAL HIGH (ref 70–99)
Glucose-Capillary: 134 mg/dL — ABNORMAL HIGH (ref 70–99)

## 2011-05-26 ENCOUNTER — Other Ambulatory Visit: Payer: Self-pay | Admitting: Cardiology

## 2011-05-26 ENCOUNTER — Encounter (HOSPITAL_COMMUNITY): Payer: 59

## 2011-05-26 LAB — GLUCOSE, CAPILLARY
Glucose-Capillary: 167 mg/dL — ABNORMAL HIGH (ref 70–99)
Glucose-Capillary: 142 mg/dL — ABNORMAL HIGH (ref 70–99)

## 2011-05-28 ENCOUNTER — Encounter (HOSPITAL_COMMUNITY): Payer: 59

## 2011-05-31 ENCOUNTER — Encounter (HOSPITAL_COMMUNITY): Payer: 59

## 2011-06-02 ENCOUNTER — Encounter (HOSPITAL_COMMUNITY): Payer: 59

## 2011-06-04 ENCOUNTER — Encounter (HOSPITAL_COMMUNITY): Payer: 59

## 2011-06-07 ENCOUNTER — Encounter (HOSPITAL_COMMUNITY): Payer: 59 | Attending: Cardiology

## 2011-06-07 DIAGNOSIS — F17200 Nicotine dependence, unspecified, uncomplicated: Secondary | ICD-10-CM | POA: Insufficient documentation

## 2011-06-07 DIAGNOSIS — E119 Type 2 diabetes mellitus without complications: Secondary | ICD-10-CM | POA: Insufficient documentation

## 2011-06-07 DIAGNOSIS — E7800 Pure hypercholesterolemia, unspecified: Secondary | ICD-10-CM | POA: Insufficient documentation

## 2011-06-07 DIAGNOSIS — Z9861 Coronary angioplasty status: Secondary | ICD-10-CM | POA: Insufficient documentation

## 2011-06-07 DIAGNOSIS — I2510 Atherosclerotic heart disease of native coronary artery without angina pectoris: Secondary | ICD-10-CM | POA: Insufficient documentation

## 2011-06-07 DIAGNOSIS — Z5189 Encounter for other specified aftercare: Secondary | ICD-10-CM | POA: Insufficient documentation

## 2011-06-07 DIAGNOSIS — I10 Essential (primary) hypertension: Secondary | ICD-10-CM | POA: Insufficient documentation

## 2011-06-09 ENCOUNTER — Encounter (HOSPITAL_COMMUNITY): Payer: 59

## 2011-06-11 ENCOUNTER — Encounter (HOSPITAL_COMMUNITY): Payer: 59

## 2011-06-14 ENCOUNTER — Other Ambulatory Visit: Payer: Self-pay | Admitting: Cardiology

## 2011-06-14 ENCOUNTER — Encounter (HOSPITAL_COMMUNITY): Payer: 59

## 2011-06-14 LAB — GLUCOSE, CAPILLARY: Glucose-Capillary: 145 mg/dL — ABNORMAL HIGH (ref 70–99)

## 2011-06-16 ENCOUNTER — Encounter (HOSPITAL_COMMUNITY): Payer: 59

## 2011-06-18 ENCOUNTER — Encounter (HOSPITAL_COMMUNITY): Payer: 59

## 2011-06-21 ENCOUNTER — Encounter (HOSPITAL_COMMUNITY): Payer: 59

## 2011-06-23 ENCOUNTER — Encounter (HOSPITAL_COMMUNITY)
Admission: RE | Admit: 2011-06-23 | Discharge: 2011-06-23 | Payer: 59 | Source: Ambulatory Visit | Attending: Cardiology | Admitting: Cardiology

## 2011-06-25 ENCOUNTER — Encounter (HOSPITAL_COMMUNITY): Payer: 59

## 2011-06-28 ENCOUNTER — Encounter (HOSPITAL_COMMUNITY): Payer: 59

## 2011-06-30 ENCOUNTER — Other Ambulatory Visit (HOSPITAL_COMMUNITY): Payer: Self-pay | Admitting: Cardiology

## 2011-06-30 ENCOUNTER — Encounter (HOSPITAL_COMMUNITY): Payer: 59

## 2011-07-02 ENCOUNTER — Encounter (HOSPITAL_COMMUNITY): Payer: 59

## 2011-07-05 ENCOUNTER — Encounter (HOSPITAL_COMMUNITY): Payer: 59

## 2011-07-07 ENCOUNTER — Encounter (HOSPITAL_COMMUNITY): Payer: 59

## 2011-07-09 ENCOUNTER — Encounter (HOSPITAL_COMMUNITY): Payer: 59

## 2011-07-12 ENCOUNTER — Encounter (HOSPITAL_COMMUNITY): Payer: 59

## 2011-07-14 ENCOUNTER — Encounter (HOSPITAL_COMMUNITY): Payer: 59

## 2011-07-16 ENCOUNTER — Encounter (HOSPITAL_COMMUNITY): Payer: 59

## 2011-07-16 ENCOUNTER — Other Ambulatory Visit (HOSPITAL_COMMUNITY): Payer: 59

## 2011-07-19 ENCOUNTER — Encounter (HOSPITAL_COMMUNITY): Payer: 59

## 2011-07-19 ENCOUNTER — Telehealth (HOSPITAL_COMMUNITY): Payer: Self-pay | Admitting: *Deleted

## 2011-07-19 NOTE — Telephone Encounter (Signed)
Message left in regards to testing to be completed on Friday for stress testing.  Pt remains on hold for exercise pending md clearance to resume.

## 2011-07-19 NOTE — Telephone Encounter (Signed)
Pt returned call. Pt canceled his appt on Friday or stress testing due to cold symptoms.  Pt has not rescheduled his appt, he is waiting to get his cold symptoms better.  Pt to call his primary MD to notify him of worsening symptoms. Pt to call and let rehab know when he will complete his stress testing.

## 2011-07-21 ENCOUNTER — Encounter (HOSPITAL_COMMUNITY): Payer: 59

## 2011-07-23 ENCOUNTER — Encounter (HOSPITAL_COMMUNITY): Payer: 59

## 2011-07-26 ENCOUNTER — Encounter (HOSPITAL_COMMUNITY): Payer: 59

## 2011-07-27 ENCOUNTER — Other Ambulatory Visit (HOSPITAL_COMMUNITY): Payer: 59

## 2011-07-28 ENCOUNTER — Encounter: Payer: Self-pay | Admitting: Internal Medicine

## 2011-07-28 ENCOUNTER — Encounter (HOSPITAL_COMMUNITY): Payer: 59

## 2011-07-30 ENCOUNTER — Encounter (HOSPITAL_COMMUNITY): Payer: 59

## 2011-07-30 ENCOUNTER — Ambulatory Visit (INDEPENDENT_AMBULATORY_CARE_PROVIDER_SITE_OTHER): Payer: 59 | Admitting: Internal Medicine

## 2011-07-30 ENCOUNTER — Encounter: Payer: Self-pay | Admitting: Internal Medicine

## 2011-07-30 VITALS — BP 140/88 | HR 44 | Ht 71.0 in | Wt 272.2 lb

## 2011-07-30 DIAGNOSIS — J309 Allergic rhinitis, unspecified: Secondary | ICD-10-CM

## 2011-07-30 DIAGNOSIS — G4733 Obstructive sleep apnea (adult) (pediatric): Secondary | ICD-10-CM

## 2011-07-30 DIAGNOSIS — R0600 Dyspnea, unspecified: Secondary | ICD-10-CM

## 2011-07-30 DIAGNOSIS — R0989 Other specified symptoms and signs involving the circulatory and respiratory systems: Secondary | ICD-10-CM

## 2011-07-30 DIAGNOSIS — R0609 Other forms of dyspnea: Secondary | ICD-10-CM

## 2011-07-30 NOTE — Patient Instructions (Addendum)
Order- 6 MWT             PFT      Dx dyspnea  We can look at the sleep apnea issue and your cpap machine after we know how your lungs are.

## 2011-07-30 NOTE — Progress Notes (Signed)
07/30/11- 72 yoM former smoker previously followed 2009 for OSA, and coming now at the request of his cardiologist, Dr. Sharyn Lull, to evaluate for dyspnea. PCP Dr [name]. He has been aware of shortness of breath for about 3 months without definite onset. He was noted to have irregular heartbeat and ultimately had catheterization and cardiac stent August 7, without infarction. He is now in cardiac rehabilitation. He notices dyspnea on exertion while walking. [place] for complaint of dyspnea and treated there with inhalers and prednisone. He feels stable now except that he remains more easily short of breath with brisk walking. He is comfortable sitting and supine. Has had flu shot. Denies cough, wheeze, chest pain, palpitation or swelling. He had smoked one pack per day for 30 years, quitting in June of 2012. Office PFT 11/05/2002 had recorded FVC 3500 (74%) FEV1 2800 (75%) FEV1/FVC 0.80 with no response to bronchodilator. Medical treatment has been for high blood pressure, atrial fibrillation, diabetes and obstructive sleep apnea. He denies history of heart failure or pulmonary embolism. OSA- NPSG 03/17/98- AHI 32/hr; weight was 270 lbs. he has worn CPAP with variable long-term compliance and control. He says sometimes the mask is "bothersome" because of postnasal drainage and he is evasive about whether he has been using it recently. Pressure had been set at 14.  ROS-see HPI Constitutional:   No-   weight loss, night sweats, fevers, chills, fatigue, lassitude. HEENT:   No-  headaches, difficulty swallowing, tooth/dental problems, sore throat,       No-  sneezing, itching, ear ache, nasal congestion, post nasal drip,  CV:  No-   chest pain, orthopnea, PND, swelling in lower extremities, anasarca,                                  dizziness, palpitations Resp: + shortness of breath with exertion, not at rest.              No-   productive cough,  No  non-productive cough,  No- coughing up of blood.              No-   change in color of mucus.  No- wheezing.   Skin: No-   rash or lesions. GI:  No-   heartburn, indigestion, abdominal pain, nausea, vomiting, diarrhea,                 change in bowel habits, loss of appetite GU: No-   dysuria, change in color of urine, no urgency or frequency.  No- flank pain. MS:  No-   joint pain or swelling.  No- decreased range of motion.  No- back pain. Neuro-     nothing unusual Psych:  No- change in mood or affect. No depression or anxiety.  No memory loss.  OBJ General- Alert, Oriented, Affect-appropriate, Distress- none acute; overweight. Skin- rash-none, lesions- none, excoriation- none Lymphadenopathy- none Head- atraumatic            Eyes- Gross vision intact, PERRLA, conjunctivae clear secretions            Ears- Hearing, canals-normal            Nose- Clear, no-Septal dev, mucus, polyps, erosion, perforation             Throat- Mallampati II , mucosa clear , drainage- none, tonsils- atrophic. White mucus. Toothpick. Neck- flexible , trachea midline,  no stridor , thyroid nl, carotid no bruit Chest - symmetrical excursion , unlabored           Heart/CV- RRR to palpation now. , no murmur , no gallop  , no rub, nl s1 s2                           - JVD- none , edema- none, stasis changes- none, varices- none           Lung- clear to P&A, wheeze- none, cough- none , dullness-none, rub- none           Chest wall-  Abd- tender-no, distended-no, bowel sounds-present, HSM- no Br/ Gen/ Rectal- Not done, not indicated Extrem- cyanosis- none, clubbing, none, atrophy- none, strength- nl Neuro- grossly intact to observation     .

## 2011-08-01 DIAGNOSIS — R0600 Dyspnea, unspecified: Secondary | ICD-10-CM | POA: Insufficient documentation

## 2011-08-01 NOTE — Assessment & Plan Note (Signed)
I educated today on association between obstructive sleep apnea, hypertension and cardiac problems including atrial fibrillation. We will try to help him get more comfortable with CPAP going forward. I've asked him to try to focus on what issues he finds "bothersome" so that these can be addressed.

## 2011-08-01 NOTE — Assessment & Plan Note (Signed)
He complains of postnasal drainage at night. He may benefit from an antihistamine or nasal spray and we can look again at that as well.

## 2011-08-01 NOTE — Assessment & Plan Note (Signed)
Although pulmonary function testing in 2004 at indicated normal spirometry, he has a 30-pack-year smoking history just ended in 2012. Some degree of COPD is likely. We will recheck formal pulmonary function tests. He has both Combivent and Spiriva and may not need both. Although he doesn't know the name of his arrhythmia and by exam, is in sinus rhythm today or at least  well controlled, he is taking amiodarone and metoprolol which may contribute to exertional dyspnea. He is overweight and I suspect a component of deconditioning as well.

## 2011-08-02 ENCOUNTER — Telehealth (HOSPITAL_COMMUNITY): Payer: Self-pay | Admitting: *Deleted

## 2011-08-02 ENCOUNTER — Encounter (HOSPITAL_COMMUNITY): Payer: 59

## 2011-08-02 NOTE — Telephone Encounter (Signed)
Called and checked on status of cardiac testing.  Pt saw pulmonary md last week and will have pulmonary function test next week.  Pt is scheduled for stress test on Tuesday.  Will await results and Md clearance to return to rehab.

## 2011-08-03 ENCOUNTER — Ambulatory Visit (HOSPITAL_COMMUNITY)
Admission: RE | Admit: 2011-08-03 | Discharge: 2011-08-03 | Disposition: A | Discharge status: Home / Self Care | Payer: 59 | Source: Ambulatory Visit | Attending: Cardiology | Admitting: Cardiology

## 2011-08-03 ENCOUNTER — Encounter (HOSPITAL_COMMUNITY)
Admission: RE | Admit: 2011-08-03 | Discharge: 2011-08-03 | Disposition: A | Discharge status: Home / Self Care | Payer: 59 | Source: Ambulatory Visit | Attending: Cardiology | Admitting: Cardiology

## 2011-08-03 DIAGNOSIS — I498 Other specified cardiac arrhythmias: Secondary | ICD-10-CM | POA: Insufficient documentation

## 2011-08-03 DIAGNOSIS — I10 Essential (primary) hypertension: Secondary | ICD-10-CM | POA: Insufficient documentation

## 2011-08-03 DIAGNOSIS — E7800 Pure hypercholesterolemia, unspecified: Secondary | ICD-10-CM | POA: Insufficient documentation

## 2011-08-03 DIAGNOSIS — E119 Type 2 diabetes mellitus without complications: Secondary | ICD-10-CM | POA: Insufficient documentation

## 2011-08-03 DIAGNOSIS — I4949 Other premature depolarization: Secondary | ICD-10-CM | POA: Insufficient documentation

## 2011-08-03 DIAGNOSIS — R0602 Shortness of breath: Secondary | ICD-10-CM | POA: Insufficient documentation

## 2011-08-03 DIAGNOSIS — R0609 Other forms of dyspnea: Secondary | ICD-10-CM | POA: Insufficient documentation

## 2011-08-03 DIAGNOSIS — R079 Chest pain, unspecified: Secondary | ICD-10-CM | POA: Insufficient documentation

## 2011-08-03 DIAGNOSIS — R0989 Other specified symptoms and signs involving the circulatory and respiratory systems: Secondary | ICD-10-CM | POA: Insufficient documentation

## 2011-08-03 MED ORDER — REGADENOSON 0.4 MG/5ML IV SOLN
INTRAVENOUS | Status: AC
  Administered 2011-08-03: 0.4 mg via INTRAVENOUS
  Filled 2011-08-03: qty 5

## 2011-08-03 MED ORDER — TECHNETIUM TC 99M TETROFOSMIN IV KIT
30.0000 | PACK | Freq: Once | INTRAVENOUS | Status: AC | PRN
  Administered 2011-08-03: 30 via INTRAVENOUS

## 2011-08-03 MED ORDER — TECHNETIUM TC 99M TETROFOSMIN IV KIT
10.0000 | PACK | Freq: Once | INTRAVENOUS | Status: AC | PRN
  Administered 2011-08-03: 10 via INTRAVENOUS

## 2011-08-04 ENCOUNTER — Encounter (HOSPITAL_COMMUNITY): Payer: 59

## 2011-08-04 ENCOUNTER — Telehealth (HOSPITAL_COMMUNITY): Payer: Self-pay | Admitting: *Deleted

## 2011-08-04 NOTE — Telephone Encounter (Signed)
Pt called regarding stress test completed on 11/27.  Per pt his stress test was "normal" but showed his heart is working at 30%.  Pt reports no additional medications added to his regimen.  Dr. Annitta Jersey office sent fax for ok to return to cardiac rehab with any restrictions noted.  Await return fax for pt to resume cardiac rehab.

## 2011-08-06 ENCOUNTER — Encounter (HOSPITAL_COMMUNITY): Payer: 59

## 2011-08-09 ENCOUNTER — Ambulatory Visit (INDEPENDENT_AMBULATORY_CARE_PROVIDER_SITE_OTHER): Payer: 59 | Admitting: Internal Medicine

## 2011-08-09 ENCOUNTER — Encounter (HOSPITAL_COMMUNITY): Payer: 59

## 2011-08-09 DIAGNOSIS — R0600 Dyspnea, unspecified: Secondary | ICD-10-CM

## 2011-08-09 DIAGNOSIS — R0609 Other forms of dyspnea: Secondary | ICD-10-CM

## 2011-08-09 DIAGNOSIS — R0989 Other specified symptoms and signs involving the circulatory and respiratory systems: Secondary | ICD-10-CM

## 2011-08-09 LAB — PULMONARY FUNCTION TEST

## 2011-08-09 NOTE — Progress Notes (Signed)
PFT was done today.  

## 2011-08-11 ENCOUNTER — Encounter (HOSPITAL_COMMUNITY): Payer: 59

## 2011-08-11 ENCOUNTER — Telehealth (HOSPITAL_COMMUNITY): Payer: Self-pay | Admitting: *Deleted

## 2011-08-11 NOTE — Progress Notes (Signed)
6 minute walk test:

## 2011-08-11 NOTE — Telephone Encounter (Signed)
Pt unable to return to rehab due to md clearance.  Pt opted to discontinue his participation due to insurance benefits.  Will send progress report to Dr. Sharyn Lull.

## 2011-08-12 ENCOUNTER — Other Ambulatory Visit: Payer: Self-pay | Admitting: Cardiology

## 2011-08-12 NOTE — Progress Notes (Signed)
Cardiac Rehabilitation Program Progress Report   Orientation:  04/16/2011 Graduate Date:  05/21/2011 Discharge Date:  08/11/2011 # of sessions completed: 14  Cardiologist: Sharyn Lull Family MD:  Lolita Cram Class Time:  45  A.  Exercise Program:  Tolerates exercise @ 3.0 METS for 30 minutes, Bike Test Results:  Pre: 4,699 ft, Exercise limited by musculoskeletal problems, Poor attendance due to illness and Discharged  B.  Mental Health:  Good mental attitude  C.  Education/Instruction/Skills  Accurately checks own pulse.  Rest:  78  Exercise:  125, Knows THR for exercise, Uses Perceived Exertion Scale and/or Dyspnea Scale and Attended 3 education classes  D.  Nutrition/Weight Control/Body Composition:  Patient has lost 1.5 kg  *This section completed by Mickle Plumb, Andres Shad, RD, LDN, CDE  E.  Blood Lipids    No results found for this basename: CHOL     No results found for this basename: TRIG     No results found for this basename: HDL     No results found for this basename: CHOLHDL     No results found for this basename: LDLDIRECT      F.  Lifestyle Changes:    G.  Symptoms noted with exercise:  Resting hypertension and Exertional hypertension  Report Completed By:  Dayton Martes   Comments:

## 2011-08-13 ENCOUNTER — Encounter (HOSPITAL_COMMUNITY): Payer: 59

## 2011-08-16 ENCOUNTER — Encounter (HOSPITAL_COMMUNITY): Payer: 59

## 2011-08-16 ENCOUNTER — Other Ambulatory Visit: Payer: Self-pay | Admitting: Cardiology

## 2011-08-18 ENCOUNTER — Encounter (HOSPITAL_COMMUNITY): Payer: 59

## 2011-08-20 ENCOUNTER — Encounter (HOSPITAL_COMMUNITY): Payer: 59

## 2011-08-23 ENCOUNTER — Encounter (HOSPITAL_COMMUNITY): Payer: 59

## 2011-08-25 ENCOUNTER — Encounter (HOSPITAL_COMMUNITY): Payer: 59

## 2011-08-27 ENCOUNTER — Encounter (HOSPITAL_COMMUNITY): Payer: 59

## 2011-09-01 ENCOUNTER — Ambulatory Visit (HOSPITAL_COMMUNITY): Payer: 59

## 2011-09-03 ENCOUNTER — Ambulatory Visit (HOSPITAL_COMMUNITY): Payer: 59

## 2011-09-03 ENCOUNTER — Ambulatory Visit (INDEPENDENT_AMBULATORY_CARE_PROVIDER_SITE_OTHER): Payer: 59 | Admitting: Internal Medicine

## 2011-09-03 ENCOUNTER — Encounter: Payer: Self-pay | Admitting: Internal Medicine

## 2011-09-03 DIAGNOSIS — I10 Essential (primary) hypertension: Secondary | ICD-10-CM

## 2011-09-03 DIAGNOSIS — J449 Chronic obstructive pulmonary disease, unspecified: Secondary | ICD-10-CM

## 2011-09-03 DIAGNOSIS — G4733 Obstructive sleep apnea (adult) (pediatric): Secondary | ICD-10-CM

## 2011-09-03 NOTE — Patient Instructions (Addendum)
For night time cough- try using your combivent rescue inhaler  2 puffs, a few minutes before you lie down at night  You can also try elevating the head of your bed by putting a  Brick under the head legs of your bed to reduce reflux of stomach juice  Try to use your CPAP regularly- our goal is all night, every night.  Over time, regular exercise like steady walking is the best approach for losing some weight and building your endurance.   Try skipping the Spiriva to see if it really helps. I have people take it on and off, a week at a time, till they can decide if it is worth while to continue or not.   Please see your primary physician about blood pressure control- 170/110 with exertion 08/09/11

## 2011-09-03 NOTE — Progress Notes (Signed)
07/30/11- 37 yoM former smoker previously followed 2009 for OSA, and coming now at the request of his cardiologist, Dr. Sharyn Lull, to evaluate for dyspnea. PCP Dr [name]. He has been aware of shortness of breath for about 3 months without definite onset. He was noted to have irregular heartbeat and ultimately had catheterization and cardiac stent August 7, without infarction. He is now in cardiac rehabilitation. He notices dyspnea on exertion while walking. He was [place] for complaint of dyspnea and treated there with inhalers and prednisone. He feels stable now except that he remains more easily short of breath with brisk walking. He is comfortable sitting and supine. Has had flu shot. Denies cough, wheeze, chest pain, palpitation or swelling. He had smoked one pack per day for 30 years, quitting in June of 2012. Office PFT 11/05/2002 had recorded FVC 3500 (74%) FEV1 2800 (75%) FEV1/FVC 0.80 with no response to bronchodilator. Medical treatment has been for high blood pressure, atrial fibrillation, diabetes and obstructive sleep apnea. He denies history of heart failure or pulmonary embolism. OSA- NPSG 03/17/98- AHI 32/hr; weight was 270 lbs. he has worn CPAP with variable long-term compliance and control. He says sometimes the mask is "bothersome" because of postnasal drainage and he is evasive about whether he has been using it recently. Pressure had been set at 14.  09/03/11-  65 yoM former smoker with dyspnea, hx OSA/CPAP, rhinitis . PCP Dr [name]. He remains off of cigarettes after quitting in June of 2012. Had flu shot. Was coughing at night but Robitussin helped. At times he coughed until he retched. Denies history of GERD. Last chest x-ray was at [facility] within the last several months and he was told it was "good". He uses oxygen off and on, now but cough is improved. PFT-08/09/2011-mild obstructive airways disease with insignificant response to  bronchodilator, mild restriction, mild reduction of DLCO FEV1/FVC 0.65, DLCO 69%. TLC 71%. 6 minute walk test 08/09/2011-96% on room air to start, falling to 88% on room air by the end of his walk. Blood pressure rose to 170/110, pulse 112. After 2 minutes recovery oxygen saturation on room air at rest was 94%. He walk 420 m. We discussed significant hypertension and significant oxygen desaturation with exertion.  ROS-see HPI Constitutional:   No-   weight loss, night sweats, fevers, chills, fatigue, lassitude. HEENT:   No-  headaches, difficulty swallowing, tooth/dental problems, sore throat,       No-  sneezing, itching, ear ache, nasal congestion, post nasal drip,  CV:  No-   chest pain, orthopnea, PND, swelling in lower extremities, anasarca, dizziness, palpitations Resp: + shortness of breath with exertion, not at rest.              No-   productive cough,  No non-productive cough,  No- coughing up of blood.              No-   change in color of mucus.  No- wheezing.   Skin: No-   rash or lesions. GI:  No-   heartburn, indigestion, abdominal pain, nausea, vomiting, diarrhea,                 change in bowel habits, loss of appetite GU:  MS:  No-   joint pain or swelling.  No- decreased range of motion.  No- back pain. Neuro-     nothing unusual Psych:  No- change in mood or affect. No depression or anxiety.  No memory loss.  OBJ General- Alert, Oriented, Affect-appropriate, Distress- none acute; overweight. Skin- rash-none, lesions- none, excoriation- none Lymphadenopathy- none Head- atraumatic            Eyes- Gross vision intact, PERRLA, conjunctivae clear secretions            Ears- Hearing, canals-normal            Nose- Clear, no-Septal dev, mucus, polyps, erosion, perforation             Throat- Mallampati II , mucosa clear , drainage- none, tonsils- atrophic. White mucus. Toothpick. Neck- flexible , trachea midline, no stridor , thyroid nl, carotid no bruit Chest -  symmetrical excursion , unlabored           Heart/CV- RRR to palpation now. , no murmur , no gallop  , no rub, nl s1 s2                           - JVD- none , edema- none, stasis changes- none, varices- none           Lung- clear to P&A, diminiahsed, wheeze- none, cough- none , dullness-none, rub- none           Chest wall-  Abd- tender-no, distended-no, bowel sounds-present, HSM- no Br/ Gen/ Rectal- Not done, not indicated Extrem- cyanosis- none, clubbing, none, atrophy- none, strength- nl Neuro- grossly intact to observation     .

## 2011-09-04 ENCOUNTER — Other Ambulatory Visit: Payer: Self-pay | Admitting: Cardiology

## 2011-09-05 DIAGNOSIS — J449 Chronic obstructive pulmonary disease, unspecified: Secondary | ICD-10-CM | POA: Insufficient documentation

## 2011-09-05 NOTE — Assessment & Plan Note (Signed)
He is directed to his primary physician for attention to this problem.

## 2011-09-05 NOTE — Assessment & Plan Note (Signed)
CPAP compliance and effectiveness remains good. Weight loss would help.

## 2011-09-05 NOTE — Assessment & Plan Note (Signed)
There is small airway obstruction without response to bronchodilator, and also reduced diffusion capacity. These are consistent with emphysema component. His cardiac disease and obesity with deconditioning will also contribute to exercise limitation. Obesity may be the cause of his mild restriction. We did not repeat a chest x-ray at this visit because he insisted that 1 elsewhere a few months ago was reported okay. I will look again at this issue on followup.

## 2011-09-06 ENCOUNTER — Ambulatory Visit (HOSPITAL_COMMUNITY): Payer: 59

## 2011-09-08 ENCOUNTER — Ambulatory Visit (HOSPITAL_COMMUNITY): Payer: 59

## 2011-09-10 ENCOUNTER — Ambulatory Visit (HOSPITAL_COMMUNITY): Payer: 59

## 2011-09-13 ENCOUNTER — Ambulatory Visit (HOSPITAL_COMMUNITY): Payer: 59

## 2011-09-15 ENCOUNTER — Ambulatory Visit (HOSPITAL_COMMUNITY): Payer: 59

## 2011-09-17 ENCOUNTER — Ambulatory Visit (HOSPITAL_COMMUNITY): Payer: 59

## 2011-09-20 ENCOUNTER — Ambulatory Visit (HOSPITAL_COMMUNITY): Payer: 59

## 2011-09-22 ENCOUNTER — Ambulatory Visit (HOSPITAL_COMMUNITY): Payer: 59

## 2011-09-24 ENCOUNTER — Ambulatory Visit (HOSPITAL_COMMUNITY): Payer: 59

## 2011-09-28 ENCOUNTER — Encounter (HOSPITAL_COMMUNITY): Payer: Self-pay

## 2011-09-28 ENCOUNTER — Emergency Department (HOSPITAL_COMMUNITY)
Admission: EM | Admit: 2011-09-28 | Discharge: 2011-09-28 | Disposition: A | Discharge status: Home / Self Care | Payer: 59 | Source: Home / Self Care | Attending: Emergency Medicine | Admitting: Emergency Medicine

## 2011-09-28 DIAGNOSIS — N200 Calculus of kidney: Secondary | ICD-10-CM

## 2011-09-28 LAB — POCT URINALYSIS DIP (DEVICE)
Bilirubin Urine: NEGATIVE
Glucose, UA: NEGATIVE mg/dL
Ketones, ur: NEGATIVE mg/dL
Nitrite: NEGATIVE
Protein, ur: NEGATIVE mg/dL
Specific Gravity, Urine: 1.015 (ref 1.005–1.030)
Urobilinogen, UA: 0.2 mg/dL (ref 0.0–1.0)
pH: 6 (ref 5.0–8.0)

## 2011-09-28 LAB — URINE CULTURE
Colony Count: NO GROWTH
Culture  Setup Time: 201301221405
Culture: NO GROWTH
Special Requests: NORMAL

## 2011-09-28 MED ORDER — OXYCODONE-ACETAMINOPHEN 5-325 MG PO TABS: ORAL_TABLET | ORAL | Status: DC

## 2011-09-28 MED ORDER — CEPHALEXIN 500 MG PO CAPS: 500.0000 mg | ORAL_CAPSULE | Freq: Three times a day (TID) | ORAL | Status: AC

## 2011-09-28 MED ORDER — TAMSULOSIN HCL 0.4 MG PO CAPS: 0.4000 mg | ORAL_CAPSULE | Freq: Every day | ORAL | Status: DC

## 2011-09-28 NOTE — ED Provider Notes (Signed)
Chief Complaint  Patient presents with  . Back Pain    History of Present Illness:  Matthew Ochoa has had a one-week history of right CVA pain. It tends to come and go. He denies any injury. There is no radiation down the leg, no numbness, tingling, or weakness. It is somewhat worse with movement, bending, or twisting. He denies any urinary symptoms such as dysuria, frequency, urgency, or hematuria. He does not have any history of kidney stones or kidney infections. He denies any abdominal pain, nausea, vomiting, or fever.  Review of Systems:  Other than noted above, the patient denies any of the following symptoms: Systemic:  No fever, chills, fatigue, or weight loss. GI:  No abdominal pain, nausea, vomiting, diarrhea, constipation or blood in stool. GU:  No dysuria, frequency, urgency, or hematuria. No incontinence or difficulty urinating.  M-S:  No neck pain, joint pain, arthritis, or myalgias. Neuro:  No parethesias or muscular weakness. Skin:  No rash or itching.   PMFSH:  Past medical history, family history, social history, meds, and allergies were reviewed.  Physical Exam:   Vital signs:  BP 142/91  Pulse 46  Temp(Src) 98.3 F (36.8 C) (Oral)  Resp 18  SpO2 96% General:  Alert, oriented, in no distress. Abdomen:  Soft, non-tender.  No organomegaly or mass.  No pulsatile midline abdominal mass or bruit. Back:  Exam of the back reveals tenderness to percussion over the CVA area. The back had a full range of motion with minimal pain. Straight leg raising was negative. Neuro:  Normal muscle strength, sensations and DTRs. Skin:  Clear, warm and dry.  No rash.  Labs:   Results for orders placed during the hospital encounter of 09/28/11  POCT URINALYSIS DIP (DEVICE)      Component Value Range   Glucose, UA NEGATIVE  NEGATIVE (mg/dL)   Bilirubin Urine NEGATIVE  NEGATIVE    Ketones, ur NEGATIVE  NEGATIVE (mg/dL)   Specific Gravity, Urine 1.015  1.005 - 1.030    Hgb urine dipstick  MODERATE (*) NEGATIVE    pH 6.0  5.0 - 8.0    Protein, ur NEGATIVE  NEGATIVE (mg/dL)   Urobilinogen, UA 0.2  0.0 - 1.0 (mg/dL)   Nitrite NEGATIVE  NEGATIVE    Leukocytes, UA SMALL (*) NEGATIVE      Radiology:  No results found.  Medications given in UCC:  None  Assessment:   Diagnoses that have been ruled out:  None  Diagnoses that are still under consideration:  None  Final diagnoses:  Kidney stone    Plan:   1.  The following meds were prescribed:   New Prescriptions   CEPHALEXIN (KEFLEX) 500 MG CAPSULE    Take 1 capsule (500 mg total) by mouth 3 (three) times daily.   OXYCODONE-ACETAMINOPHEN (PERCOCET) 5-325 MG PER TABLET    1 to 2 tablets every 6 hours as needed for pain.   TAMSULOSIN HCL (FLOMAX) 0.4 MG CAPS    Take 1 capsule (0.4 mg total) by mouth daily.   2.  The patient was instructed in symptomatic care and handouts were given. 3.  The patient was told to return if becoming worse in any way, if no better in 3 or 4 days, and given some red flag symptoms that would indicate earlier return. 4.  The patient was told to followup with his urologist, Dr. Ezzie Dural, if no improvement in 48 hours.    [name], MD 09/28/11 [phone]

## 2011-09-28 NOTE — ED Notes (Signed)
C/o rt flank pain for 2 weeks- describes as back spasms.  sTates he has had similar before but never for this long. States pain is constant for a while and then intermittent.  Cannot lie on this side. Denies urinary sx, injury.    States he just doesn't feel well today.

## 2011-10-08 ENCOUNTER — Encounter (INDEPENDENT_AMBULATORY_CARE_PROVIDER_SITE_OTHER): Payer: Self-pay | Admitting: Surgery

## 2011-10-09 ENCOUNTER — Emergency Department (HOSPITAL_COMMUNITY): Payer: 59

## 2011-10-09 ENCOUNTER — Inpatient Hospital Stay (HOSPITAL_COMMUNITY)
Admission: EM | Admit: 2011-10-09 | Discharge: 2011-10-12 | DRG: 312 | Disposition: A | Discharge status: Home / Self Care | Payer: 59 | Source: Ambulatory Visit | Attending: Cardiovascular Disease | Admitting: Cardiovascular Disease

## 2011-10-09 ENCOUNTER — Encounter (HOSPITAL_COMMUNITY): Payer: Self-pay | Admitting: Emergency Medicine

## 2011-10-09 ENCOUNTER — Other Ambulatory Visit: Payer: Self-pay

## 2011-10-09 DIAGNOSIS — Z9861 Coronary angioplasty status: Secondary | ICD-10-CM

## 2011-10-09 DIAGNOSIS — Z7982 Long term (current) use of aspirin: Secondary | ICD-10-CM

## 2011-10-09 DIAGNOSIS — E662 Morbid (severe) obesity with alveolar hypoventilation: Secondary | ICD-10-CM | POA: Diagnosis present

## 2011-10-09 DIAGNOSIS — R42 Dizziness and giddiness: Secondary | ICD-10-CM

## 2011-10-09 DIAGNOSIS — R55 Syncope and collapse: Principal | ICD-10-CM | POA: Diagnosis present

## 2011-10-09 DIAGNOSIS — H814 Vertigo of central origin: Secondary | ICD-10-CM | POA: Diagnosis present

## 2011-10-09 DIAGNOSIS — Z91199 Patient's noncompliance with other medical treatment and regimen due to unspecified reason: Secondary | ICD-10-CM

## 2011-10-09 DIAGNOSIS — Z9119 Patient's noncompliance with other medical treatment and regimen: Secondary | ICD-10-CM

## 2011-10-09 DIAGNOSIS — R109 Unspecified abdominal pain: Secondary | ICD-10-CM

## 2011-10-09 DIAGNOSIS — K8020 Calculus of gallbladder without cholecystitis without obstruction: Secondary | ICD-10-CM | POA: Diagnosis present

## 2011-10-09 DIAGNOSIS — I10 Essential (primary) hypertension: Secondary | ICD-10-CM | POA: Diagnosis present

## 2011-10-09 DIAGNOSIS — I4891 Unspecified atrial fibrillation: Secondary | ICD-10-CM

## 2011-10-09 DIAGNOSIS — Z79899 Other long term (current) drug therapy: Secondary | ICD-10-CM

## 2011-10-09 DIAGNOSIS — I2510 Atherosclerotic heart disease of native coronary artery without angina pectoris: Secondary | ICD-10-CM | POA: Diagnosis present

## 2011-10-09 DIAGNOSIS — J309 Allergic rhinitis, unspecified: Secondary | ICD-10-CM | POA: Diagnosis present

## 2011-10-09 DIAGNOSIS — R1011 Right upper quadrant pain: Secondary | ICD-10-CM | POA: Diagnosis present

## 2011-10-09 DIAGNOSIS — I498 Other specified cardiac arrhythmias: Secondary | ICD-10-CM | POA: Diagnosis present

## 2011-10-09 DIAGNOSIS — N390 Urinary tract infection, site not specified: Secondary | ICD-10-CM | POA: Diagnosis present

## 2011-10-09 DIAGNOSIS — E119 Type 2 diabetes mellitus without complications: Secondary | ICD-10-CM | POA: Diagnosis present

## 2011-10-09 DIAGNOSIS — Z87891 Personal history of nicotine dependence: Secondary | ICD-10-CM

## 2011-10-09 DIAGNOSIS — G4733 Obstructive sleep apnea (adult) (pediatric): Secondary | ICD-10-CM | POA: Diagnosis present

## 2011-10-09 LAB — BASIC METABOLIC PANEL
BUN: 16 mg/dL (ref 6–23)
CO2: 25 mEq/L (ref 19–32)
Calcium: 9.3 mg/dL (ref 8.4–10.5)
Chloride: 102 mEq/L (ref 96–112)
Creatinine, Ser: 1.17 mg/dL (ref 0.50–1.35)
GFR calc Af Amer: 74 mL/min — ABNORMAL LOW (ref >90)
GFR calc non Af Amer: 64 mL/min — ABNORMAL LOW (ref >90)
Glucose, Bld: 105 mg/dL — ABNORMAL HIGH (ref 70–99)
Potassium: 3.9 mEq/L (ref 3.5–5.1)
Sodium: 138 mEq/L (ref 135–145)

## 2011-10-09 LAB — HEPATIC FUNCTION PANEL
ALT: 22 U/L (ref 0–53)
AST: 18 U/L (ref 0–37)
Albumin: 3.7 g/dL (ref 3.5–5.2)
Alkaline Phosphatase: 37 U/L — ABNORMAL LOW (ref 39–117)
Bilirubin, Direct: <0.1 mg/dL (ref 0.0–0.3)
Total Bilirubin: 0.5 mg/dL (ref 0.3–1.2)
Total Protein: 7.1 g/dL (ref 6.0–8.3)

## 2011-10-09 LAB — CBC
HCT: 37 % — ABNORMAL LOW (ref 39.0–52.0)
Hemoglobin: 13.4 g/dL (ref 13.0–17.0)
MCH: 28.3 pg (ref 26.0–34.0)
MCHC: 36.2 g/dL — ABNORMAL HIGH (ref 30.0–36.0)
MCV: 78.1 fL (ref 78.0–100.0)
Platelets: 143 10*3/uL — ABNORMAL LOW (ref 150–400)
RBC: 4.74 MIL/uL (ref 4.22–5.81)
RDW: 17 % — ABNORMAL HIGH (ref 11.5–15.5)
WBC: 7.5 10*3/uL (ref 4.0–10.5)

## 2011-10-09 LAB — URINALYSIS, ROUTINE W REFLEX MICROSCOPIC
Bilirubin Urine: NEGATIVE
Glucose, UA: NEGATIVE mg/dL
Ketones, ur: NEGATIVE mg/dL
Nitrite: NEGATIVE
Protein, ur: NEGATIVE mg/dL
Specific Gravity, Urine: 1.014 (ref 1.005–1.030)
Urobilinogen, UA: 2 mg/dL — ABNORMAL HIGH (ref 0.0–1.0)
pH: 7 (ref 5.0–8.0)

## 2011-10-09 LAB — URINE MICROSCOPIC-ADD ON

## 2011-10-09 LAB — CARDIAC PANEL(CRET KIN+CKTOT+MB+TROPI)
CK, MB: 3.6 ng/mL (ref 0.3–4.0)
Relative Index: 1.7 (ref 0.0–2.5)
Total CK: 207 U/L (ref 7–232)
Troponin I: <0.3 ng/mL (ref <0.30)

## 2011-10-09 LAB — GLUCOSE, CAPILLARY: Glucose-Capillary: 142 mg/dL — ABNORMAL HIGH (ref 70–99)

## 2011-10-09 MED ORDER — FELODIPINE ER 5 MG PO TB24
5.0000 mg | ORAL_TABLET | Freq: Every day | ORAL | Status: DC
  Administered 2011-10-10 – 2011-10-12 (×2): 5 mg via ORAL
  Filled 2011-10-09 (×3): qty 1

## 2011-10-09 MED ORDER — AMIODARONE HCL 200 MG PO TABS
200.0000 mg | ORAL_TABLET | Freq: Every day | ORAL | Status: DC
  Filled 2011-10-09: qty 1

## 2011-10-09 MED ORDER — PRASUGREL HCL 10 MG PO TABS
10.0000 mg | ORAL_TABLET | Freq: Every day | ORAL | Status: DC
  Administered 2011-10-10 – 2011-10-12 (×3): 10 mg via ORAL
  Filled 2011-10-09 (×3): qty 1

## 2011-10-09 MED ORDER — TIOTROPIUM BROMIDE MONOHYDRATE 18 MCG IN CAPS
18.0000 ug | ORAL_CAPSULE | Freq: Every day | RESPIRATORY_TRACT | Status: DC
  Filled 2011-10-09: qty 5

## 2011-10-09 MED ORDER — LORAZEPAM 2 MG/ML IJ SOLN
1.0000 mg | Freq: Once | INTRAMUSCULAR | Status: AC
  Administered 2011-10-09: 1 mg via INTRAVENOUS
  Filled 2011-10-09: qty 1

## 2011-10-09 MED ORDER — OLMESARTAN MEDOXOMIL 20 MG PO TABS
20.0000 mg | ORAL_TABLET | Freq: Every day | ORAL | Status: DC
  Administered 2011-10-10 – 2011-10-12 (×3): 20 mg via ORAL
  Filled 2011-10-09 (×3): qty 1

## 2011-10-09 MED ORDER — INSULIN ASPART 100 UNIT/ML ~~LOC~~ SOLN
0.0000 [IU] | Freq: Three times a day (TID) | SUBCUTANEOUS | Status: DC
  Administered 2011-10-10: 4 [IU] via SUBCUTANEOUS
  Administered 2011-10-10 – 2011-10-12 (×5): 3 [IU] via SUBCUTANEOUS
  Filled 2011-10-09: qty 3

## 2011-10-09 MED ORDER — ONDANSETRON HCL 4 MG/2ML IJ SOLN: 4.0000 mg | Freq: Four times a day (QID) | INTRAMUSCULAR | Status: DC | PRN

## 2011-10-09 MED ORDER — DIAZEPAM 2 MG PO TABS: 2.0000 mg | ORAL_TABLET | ORAL | Status: DC

## 2011-10-09 MED ORDER — MORPHINE SULFATE 4 MG/ML IJ SOLN
4.0000 mg | Freq: Once | INTRAMUSCULAR | Status: AC
  Administered 2011-10-09: 4 mg via INTRAVENOUS
  Filled 2011-10-09: qty 1

## 2011-10-09 MED ORDER — CIPROFLOXACIN HCL 500 MG PO TABS
500.0000 mg | ORAL_TABLET | Freq: Two times a day (BID) | ORAL | Status: DC
  Administered 2011-10-10 – 2011-10-12 (×6): 500 mg via ORAL
  Filled 2011-10-09 (×8): qty 1

## 2011-10-09 MED ORDER — OXYCODONE-ACETAMINOPHEN 5-325 MG PO TABS
1.0000 | ORAL_TABLET | ORAL | Status: DC | PRN
  Administered 2011-10-10 – 2011-10-12 (×6): 2 via ORAL
  Filled 2011-10-09 (×6): qty 2

## 2011-10-09 MED ORDER — SODIUM CHLORIDE 0.9 % IJ SOLN
3.0000 mL | Freq: Two times a day (BID) | INTRAMUSCULAR | Status: DC
  Administered 2011-10-09 – 2011-10-12 (×4): 3 mL via INTRAVENOUS

## 2011-10-09 MED ORDER — SODIUM CHLORIDE 0.9 % IJ SOLN
3.0000 mL | INTRAMUSCULAR | Status: DC | PRN
  Administered 2011-10-12: 3 mL via INTRAVENOUS

## 2011-10-09 MED ORDER — ALUM & MAG HYDROXIDE-SIMETH 200-200-20 MG/5ML PO SUSP: 30.0000 mL | Freq: Four times a day (QID) | ORAL | Status: DC | PRN

## 2011-10-09 MED ORDER — SODIUM CHLORIDE 0.9 % IV SOLN: 250.0000 mL | INTRAVENOUS | Status: DC | PRN

## 2011-10-09 MED ORDER — ATENOLOL 25 MG PO TABS
25.0000 mg | ORAL_TABLET | Freq: Every day | ORAL | Status: DC
  Filled 2011-10-09: qty 1

## 2011-10-09 MED ORDER — SODIUM CHLORIDE 0.9 % IJ SOLN
3.0000 mL | Freq: Two times a day (BID) | INTRAMUSCULAR | Status: DC
  Administered 2011-10-10: 3 mL via INTRAVENOUS

## 2011-10-09 MED ORDER — ENOXAPARIN SODIUM 40 MG/0.4ML ~~LOC~~ SOLN
40.0000 mg | SUBCUTANEOUS | Status: DC
  Administered 2011-10-10 – 2011-10-12 (×2): 40 mg via SUBCUTANEOUS
  Filled 2011-10-09 (×3): qty 0.4

## 2011-10-09 MED ORDER — ROSUVASTATIN CALCIUM 10 MG PO TABS
10.0000 mg | ORAL_TABLET | Freq: Every day | ORAL | Status: DC
  Administered 2011-10-10 – 2011-10-11 (×2): 10 mg via ORAL
  Filled 2011-10-09 (×3): qty 1

## 2011-10-09 MED ORDER — HYDROCHLOROTHIAZIDE 12.5 MG PO CAPS
12.5000 mg | ORAL_CAPSULE | Freq: Every day | ORAL | Status: DC
  Administered 2011-10-10 – 2011-10-12 (×3): 12.5 mg via ORAL
  Filled 2011-10-09 (×3): qty 1

## 2011-10-09 MED ORDER — MECLIZINE HCL 12.5 MG PO TABS
12.5000 mg | ORAL_TABLET | Freq: Three times a day (TID) | ORAL | Status: DC
  Administered 2011-10-10 – 2011-10-12 (×8): 12.5 mg via ORAL
  Filled 2011-10-09 (×10): qty 1

## 2011-10-09 MED ORDER — DOCUSATE SODIUM 100 MG PO CAPS
100.0000 mg | ORAL_CAPSULE | Freq: Two times a day (BID) | ORAL | Status: DC
  Administered 2011-10-10 – 2011-10-12 (×6): 100 mg via ORAL
  Filled 2011-10-09 (×7): qty 1

## 2011-10-09 MED ORDER — ONDANSETRON HCL 4 MG PO TABS: 4.0000 mg | ORAL_TABLET | Freq: Four times a day (QID) | ORAL | Status: DC | PRN

## 2011-10-09 NOTE — H&P (Signed)
Matthew Ochoa is an 66 y.o. male.   Chief Complaint: Syncope and abdominal pain.  HPI: 66 years old black male has 2 episodes of syncope with dizziness x 2 weeks. He also has right upper quadrant abdominal pain without nausea, vomiting or diarrhea. He has history of CAD with stent placement in 04/2011. He also has DM, II with poor control.  Past Medical History  Diagnosis Date  . Sleep apnea     NPSG 03-17-98, cpap 14  . Type 2 diabetes mellitus   . Hypertension   . COPD (chronic obstructive pulmonary disease)   . HTN (hypertension)   . Allergic rhinitis       Past Surgical History  Procedure Date  . Angioplasty     stent  . Coronary angioplasty with stent placement     Family History  Problem Relation Age of Onset  . Asthma Sister   . Lung cancer Father    Social History:  reports that he quit smoking about 8 months ago. His smoking use included Cigarettes. He has a 30 pack-year smoking history. He does not have any smokeless tobacco history on file. He reports that he does not drink alcohol or use illicit drugs.  Allergies: No Known Allergies  Medications Prior to Admission  Medication Dose Route Frequency Provider Last Rate Last Dose  . LORazepam (ATIVAN) injection 1 mg  1 mg Intravenous Once Peter A. Patrica Duel, MD   1 mg at 10/09/11 1646  . morphine 4 MG/ML injection 4 mg  4 mg Intravenous Once Peter A. Patrica Duel, MD   4 mg at 10/09/11 1734  . morphine 4 MG/ML injection 4 mg  4 mg Intravenous Once Peter A. Patrica Duel, MD   4 mg at 10/09/11 1856  . morphine 4 MG/ML injection 4 mg  4 mg Intravenous Once Peter A. Patrica Duel, MD   4 mg at 10/09/11 2050  . DISCONTD: diazepam (VALIUM) tablet 2 mg  2 mg Oral To Major Peter A. Patrica Duel, MD       Medications Prior to Admission  Medication Sig Dispense Refill  . amiodarone (PACERONE) 200 MG tablet Take 1 tablet by mouth Twice daily.      Marland Kitchen aspirin 81 MG tablet Take 81 mg by mouth daily.        Marland Kitchen atenolol (TENORMIN) 25 MG tablet Take 25 mg by  mouth daily.      Marland Kitchen CIALIS 2.5 MG TABS Take 1 tablet by mouth daily as needed. For erectile dysfunction      . COMBIVENT 18-103 MCG/ACT inhaler Inhale 2 puffs into the lungs 4 (four) times daily as needed. For shortness of breath      . CRESTOR 10 MG tablet Take 1 tablet by mouth daily.      Marland Kitchen EFFIENT 10 MG TABS Take 1 tablet by mouth daily.         . felodipine (PLENDIL) 5 MG 24 hr tablet Take 5 mg by mouth daily.      . fish oil-omega-3 fatty acids 1000 MG capsule Take 2 g by mouth daily.      . metFORMIN (GLUCOPHAGE) 500 MG tablet Take 1 tablet by mouth 2 (two) times daily with a meal.       . metoprolol succinate (TOPROL-XL) 25 MG 24 hr tablet Take 1 tablet by mouth daily.      . niacin-simvastatin (SIMCOR) 500-20 MG 24 hr tablet Take 1 tablet by mouth at bedtime.      Marland Kitchen SPIRIVA HANDIHALER 18 MCG  inhalation capsule Place 18 mcg into inhaler and inhale daily.       . Tamsulosin HCl (FLOMAX) 0.4 MG CAPS Take 1 capsule (0.4 mg total) by mouth daily.  7 capsule  0  . telmisartan-hydrochlorothiazide (MICARDIS HCT) 80-12.5 MG per tablet Take 1 tablet by mouth daily.        . cephALEXin (KEFLEX) 500 MG capsule Take 1 capsule (500 mg total) by mouth 3 (three) times daily.  30 capsule  0  . oxyCODONE-acetaminophen (PERCOCET) 5-325 MG per tablet Take 1-2 tablets by mouth every 6 (six) hours as needed. for pain.        Results for orders placed during the hospital encounter of 10/09/11 (from the past 48 hour(s))  CBC     Status: Abnormal   Collection Time   10/09/11  3:28 PM      Component Value Range Comment   WBC 7.5  4.0 - 10.5 (K/uL)    RBC 4.74  4.22 - 5.81 (MIL/uL)    Hemoglobin 13.4  13.0 - 17.0 (g/dL)    HCT 16.1 (*) 09.6 - 52.0 (%)    MCV 78.1  78.0 - 100.0 (fL)    MCH 28.3  26.0 - 34.0 (pg)    MCHC 36.2 (*) 30.0 - 36.0 (g/dL)    RDW 04.5 (*) 40.9 - 15.5 (%)    Platelets 143 (*) 150 - 400 (K/uL)   BASIC METABOLIC PANEL     Status: Abnormal   Collection Time   10/09/11  3:28 PM       Component Value Range Comment   Sodium 138  135 - 145 (mEq/L)    Potassium 3.9  3.5 - 5.1 (mEq/L)    Chloride 102  96 - 112 (mEq/L)    CO2 25  19 - 32 (mEq/L)    Glucose, Bld 105 (*) 70 - 99 (mg/dL)    BUN 16  6 - 23 (mg/dL)    Creatinine, Ser 8.11  0.50 - 1.35 (mg/dL)    Calcium 9.3  8.4 - 10.5 (mg/dL)    GFR calc non Af Amer 64 (*) >90 (mL/min)    GFR calc Af Amer 74 (*) >90 (mL/min)   CARDIAC PANEL(CRET KIN+CKTOT+MB+TROPI)     Status: Normal   Collection Time   10/09/11  3:28 PM      Component Value Range Comment   Total CK 207  7 - 232 (U/L)    CK, MB 3.6  0.3 - 4.0 (ng/mL)    Troponin I <0.30  <0.30 (ng/mL)    Relative Index 1.7  0.0 - 2.5    HEPATIC FUNCTION PANEL     Status: Abnormal   Collection Time   10/09/11  3:28 PM      Component Value Range Comment   Total Protein 7.1  6.0 - 8.3 (g/dL)    Albumin 3.7  3.5 - 5.2 (g/dL)    AST 18  0 - 37 (U/L)    ALT 22  0 - 53 (U/L)    Alkaline Phosphatase 37 (*) 39 - 117 (U/L)    Total Bilirubin 0.5  0.3 - 1.2 (mg/dL)    Bilirubin, Direct <9.1  0.0 - 0.3 (mg/dL)    Indirect Bilirubin NOT CALCULATED  0.3 - 0.9 (mg/dL)   URINALYSIS, ROUTINE W REFLEX MICROSCOPIC     Status: Abnormal   Collection Time   10/09/11  4:44 PM      Component Value Range Comment   Color, Urine YELLOW  YELLOW     APPearance HAZY (*) CLEAR     Specific Gravity, Urine 1.014  1.005 - 1.030     pH 7.0  5.0 - 8.0     Glucose, UA NEGATIVE  NEGATIVE (mg/dL)    Hgb urine dipstick TRACE (*) NEGATIVE     Bilirubin Urine NEGATIVE  NEGATIVE     Ketones, ur NEGATIVE  NEGATIVE (mg/dL)    Protein, ur NEGATIVE  NEGATIVE (mg/dL)    Urobilinogen, UA 2.0 (*) 0.0 - 1.0 (mg/dL)    Nitrite NEGATIVE  NEGATIVE     Leukocytes, UA TRACE (*) NEGATIVE    URINE MICROSCOPIC-ADD ON     Status: Normal   Collection Time   10/09/11  4:44 PM      Component Value Range Comment   WBC, UA 3-6  <3 (WBC/hpf)    RBC / HPF 3-6  <3 (RBC/hpf)    Urine-Other SPERM PRESENT      Ct Head Wo  Contrast  10/09/2011  *RADIOLOGY REPORT*  Clinical Data: Dizziness, fall  CT HEAD WITHOUT CONTRAST  Technique:  Contiguous axial images were obtained from the base of the skull through the vertex without contrast.  Comparison: None.  Findings: Coarse calcification in the region of the falx posteriorly is unlikely to be clinically significant. No acute hemorrhage, acute infarction, or mass lesion is identified.  No midline shift.  No ventriculomegaly.  No skull fracture. Orbits and paranasal sinuses are intact.  IMPRESSION: No acute intracranial finding.  Original Report Authenticated By: Harrel Lemon, M.D.   US Abdomen Complete  10/09/2011  *RADIOLOGY REPORT*  Clinical Data:  Right upper quadrant abdominal pain.  COMPLETE ABDOMINAL ULTRASOUND  Comparison:  CT abdomen and pelvis without contrast 09/30/2011.  CT abdomen and pelvis without and with contrast 06/18/2010 at [facility].  Findings:  Gallbladder:  Multiple shadowing gallstones are present.  There is no wall thickening.  No sonographic Murphy's sign is evident peri  Common bile duct:  Normal in caliber. No biliary ductal dilation. The maximal diameter is 4.1 mm, within normal limits.  Liver:  The liver is heterogeneous.  No focal lesion is evident. There is loss of internal echo texture, suggesting fatty infiltration.  IVC:  The visualized portions are within normal limits.  Pancreas:  Although the pancreas is difficult to visualize in its entirety, no focal pancreatic abnormality is identified.  Spleen:  Normal size and echotexture without focal parenchymal abnormality.  The maximal length is 4.1 cm.  Right Kidney:  The right kidney cyst appears simple.  No other focal lesions are present.  The renal parenchyma and is otherwise within normal limits.  The maximal length is 13.2 cm.  Left Kidney:  No hydronephrosis.  Well-preserved cortex.  Normal size and parenchymal echotexture without focal abnormalities.   The maximal length is 11.7 cm peri   Abdominal aorta:  A the aorta is not well visualized due to abdominal gas.  IMPRESSION:  1.  Cholelithiasis without evidence for cholecystitis. 2.  Right renal cysts.  Original Report Authenticated By: Jamesetta Orleans. MATTERN, M.D.    @ROS @  Blood pressure 166/89, pulse 84, temperature 98.4 F (36.9 C), temperature source Oral, resp. rate 16, SpO2 95.00%. Constitutional: He is oriented to person, place, and time. He appears well-developed and well-nourished.  Head: Normocephalic and atraumatic.  Eyes: Brown eyes, Conjunctivae and EOM are normal. Pupils are equal, round, and reactive to light.  Neck: No JVD, supple.  Cardiovascular: Normal rate and irregular rhythm. Exam  reveals no gallop and no friction rub.  No murmur heard.  Pulmonary/Chest: Breath sounds normal. He has forced expiratory wheezes. He has no rales. He exhibits no tenderness.  Abdominal: Soft. Bowel sounds are normal. He exhibits no distension and no mass. Mild tenderness to palpation in the right upper quadrant. There is no masses or pulsations appreciated. No rebound, rigidity or guarding  Musculoskeletal: Normal range of motion.  Neurological: He is alert and oriented to person, place, and time. No cranial nerve deficit. Coordination normal.  Skin: Skin is warm and dry. No rash noted.  Psychiatric: He has a normal mood and affect.    Assessment/Plan Syncope Positional vertigo Possible UTI Abdominal pain GB stones CAD DM, II Obesity  Admit Monitor rhythm. Monitor sugars. Add Antivert. CK/MB  Erlean Mealor S 10/09/2011, 10:26 PM

## 2011-10-09 NOTE — ED Provider Notes (Addendum)
History     CSN: [id number]  Arrival date & time 10/09/11  1455   First MD Initiated Contact with Patient 10/09/11 1513      Chief Complaint  Patient presents with  . Dizziness   Patient, with known h/o chronic AFIB, CAD, [ DR Harwani]  DM II, HTN, COPD, AFIB ,  states she's been feeling "swimmy headed" for months. However, today it was worse. States he was at home and was preparing to watch golf on TV and states he fell back and does not remember exactly what happened after that. He then took a shower and felt much better. He went to go run some errands and said he almost fell again. He states was recently diagnosed with "gallstones" and has been referred to see a Careers adviser. He has had some intermittent right upper quadrant pain, but denies any chest pain. He's had no nausea, vomiting. Denies any focal weakness. He has had no vertigo. He's had no shortness of breath. No back pain. He does state that he had a stent placed in his heart last August by Dr. Sharyn Lull.  No recent illnesses or fever. Denies any trauma (Consider location/radiation/quality/duration/timing/severity/associated sxs/prior treatment) HPI  Past Medical History  Diagnosis Date  . Sleep apnea     NPSG 03-17-98, cpap 14  . Type 2 diabetes mellitus   . Hypertension   . COPD (chronic obstructive pulmonary disease)   . HTN (hypertension)   . Allergic rhinitis     Past Surgical History  Procedure Date  . Angioplasty     stent  . Coronary angioplasty with stent placement     Family History  Problem Relation Age of Onset  . Asthma Sister   . Lung cancer Father     History  Substance Use Topics  . Smoking status: Former Smoker -- 1.0 packs/day for 30 years    Types: Cigarettes    Quit date: 02/05/2011  . Smokeless tobacco: Not on file  . Alcohol Use: No      Review of Systems  All other systems reviewed and are negative.    Allergies  Review of patient's allergies indicates no known allergies.  Home  Medications   Current Outpatient Rx  Name Route Sig Dispense Refill  . AMIODARONE HCL 200 MG PO TABS Oral Take 1 tablet by mouth Twice daily.    . ASPIRIN 81 MG PO TABS Oral Take 81 mg by mouth daily.      . ATENOLOL 25 MG PO TABS Oral Take 25 mg by mouth daily.    Marland Kitchen CIALIS 2.5 MG PO TABS Oral Take 1 tablet by mouth daily as needed. For erectile dysfunction    . COMBIVENT 18-103 MCG/ACT IN AERO Inhalation Inhale 2 puffs into the lungs 4 (four) times daily as needed. For shortness of breath    . CRESTOR 10 MG PO TABS Oral Take 1 tablet by mouth daily.    Marland Kitchen EFFIENT 10 MG PO TABS Oral Take 1 tablet by mouth daily.       Marland Kitchen FELODIPINE ER 5 MG PO TB24 Oral Take 5 mg by mouth daily.    . OMEGA-3 FATTY ACIDS 1000 MG PO CAPS Oral Take 2 g by mouth daily.    Marland Kitchen METFORMIN HCL 500 MG PO TABS Oral Take 1 tablet by mouth 2 (two) times daily with a meal.     . METOPROLOL SUCCINATE ER 25 MG PO TB24 Oral Take 1 tablet by mouth daily.    Marland Kitchen NIACIN-SIMVASTATIN  ER 500-20 MG PO TB24 Oral Take 1 tablet by mouth at bedtime.    Marland Kitchen SPIRIVA HANDIHALER 18 MCG IN CAPS Inhalation Place 18 mcg into inhaler and inhale daily.     Marland Kitchen TAMSULOSIN HCL 0.4 MG PO CAPS Oral Take 1 capsule (0.4 mg total) by mouth daily. 7 capsule 0  . TELMISARTAN-HCTZ 80-12.5 MG PO TABS Oral Take 1 tablet by mouth daily.      . CEPHALEXIN 500 MG PO CAPS Oral Take 1 capsule (500 mg total) by mouth 3 (three) times daily. 30 capsule 0  . OXYCODONE-ACETAMINOPHEN 5-325 MG PO TABS Oral Take 1-2 tablets by mouth every 6 (six) hours as needed. for pain.      BP 195/109  Pulse 41  Temp(Src) 98.4 F (36.9 C) (Oral)  Resp 14  SpO2 96%  Physical Exam  Nursing note and vitals reviewed. Constitutional: He is oriented to person, place, and time. He appears well-developed and well-nourished.  HENT:  Head: Normocephalic and atraumatic.  Eyes: Conjunctivae and EOM are normal. Pupils are equal, round, and reactive to light.  Neck: Neck supple.    Cardiovascular: Normal rate and regular rhythm.  Exam reveals no gallop and no friction rub.   No murmur heard. Pulmonary/Chest: Breath sounds normal. He has no wheezes. He has no rales. He exhibits no tenderness.  Abdominal: Soft. Bowel sounds are normal. He exhibits no distension and no mass. There is no tenderness. There is no rebound and no guarding.       Mild tenderness to palpation in the right upper quadrant. There is no masses or pulsations appreciated. No rebound, rigidity or guarding  Musculoskeletal: Normal range of motion.  Neurological: He is alert and oriented to person, place, and time. No cranial nerve deficit. Coordination normal.  Skin: Skin is warm and dry. No rash noted.  Psychiatric: He has a normal mood and affect.    ED Course  Procedures (including critical care time)  Labs Reviewed  CBC - Abnormal; Notable for the following:    HCT 37.0 (*)    MCHC 36.2 (*)    RDW 17.0 (*)    Platelets 143 (*)    All other components within normal limits  BASIC METABOLIC PANEL - Abnormal; Notable for the following:    Glucose, Bld 105 (*)    GFR calc non Af Amer 64 (*)    GFR calc Af Amer 74 (*)    All other components within normal limits  URINALYSIS, ROUTINE W REFLEX MICROSCOPIC - Abnormal; Notable for the following:    APPearance HAZY (*)    Hgb urine dipstick TRACE (*)    Urobilinogen, UA 2.0 (*)    Leukocytes, UA TRACE (*)    All other components within normal limits  HEPATIC FUNCTION PANEL - Abnormal; Notable for the following:    Alkaline Phosphatase 37 (*)    All other components within normal limits  CARDIAC PANEL(CRET KIN+CKTOT+MB+TROPI)  URINE MICROSCOPIC-ADD ON   Ct Head Wo Contrast  10/09/2011  *RADIOLOGY REPORT*  Clinical Data: Dizziness, fall  CT HEAD WITHOUT CONTRAST  Technique:  Contiguous axial images were obtained from the base of the skull through the vertex without contrast.  Comparison: None.  Findings: Coarse calcification in the region of the  falx posteriorly is unlikely to be clinically significant. No acute hemorrhage, acute infarction, or mass lesion is identified.  No midline shift.  No ventriculomegaly.  No skull fracture. Orbits and paranasal sinuses are intact.  IMPRESSION: No acute intracranial  finding.  Original Report Authenticated By: Harrel Lemon, M.D.     1. A-fib   2. Dizziness   3. Near syncope   4. Abdominal  pain, other specified site       MDM  Patient is seen and examined, initial history and physical is completed. Evaluation initiated    Workup included EKG, labs, head CT, urine, and will review the chart as patient states he recently had a CAT scan done of his abdomen.------  CT found on PACs:  From Sep 30, 2011 No renal or ureteral stones, no hydro 2 renal cysts Enlarged prostate,  Cholelithiasis, without assoc. Inflammatory changes Normal appendix, no bowel obstruction  6:14 PM  Date: 10/09/2011  Rate: 77  Rhythm: atrial fibrillation  QRS Axis: left  Intervals: PR prolonged  ST/T Wave abnormalities: nonspecific ST/T changes  Conduction Disutrbances:nonspecific intraventricular conduction delay  Narrative Interpretation:   Old EKG Reviewed: changes noted  6:14 PM  Results for orders placed during the hospital encounter of 10/09/11  CBC      Component Value Range   WBC 7.5  4.0 - 10.5 (K/uL)   RBC 4.74  4.22 - 5.81 (MIL/uL)   Hemoglobin 13.4  13.0 - 17.0 (g/dL)   HCT 16.1 (*) 09.6 - 52.0 (%)   MCV 78.1  78.0 - 100.0 (fL)   MCH 28.3  26.0 - 34.0 (pg)   MCHC 36.2 (*) 30.0 - 36.0 (g/dL)   RDW 04.5 (*) 40.9 - 15.5 (%)   Platelets 143 (*) 150 - 400 (K/uL)  BASIC METABOLIC PANEL      Component Value Range   Sodium 138  135 - 145 (mEq/L)   Potassium 3.9  3.5 - 5.1 (mEq/L)   Chloride 102  96 - 112 (mEq/L)   CO2 25  19 - 32 (mEq/L)   Glucose, Bld 105 (*) 70 - 99 (mg/dL)   BUN 16  6 - 23 (mg/dL)   Creatinine, Ser 8.11  0.50 - 1.35 (mg/dL)   Calcium 9.3  8.4 - 91.4 (mg/dL)   GFR  calc non Af Amer 64 (*) >90 (mL/min)   GFR calc Af Amer 74 (*) >90 (mL/min)  URINALYSIS, ROUTINE W REFLEX MICROSCOPIC      Component Value Range   Color, Urine YELLOW  YELLOW    APPearance HAZY (*) CLEAR    Specific Gravity, Urine 1.014  1.005 - 1.030    pH 7.0  5.0 - 8.0    Glucose, UA NEGATIVE  NEGATIVE (mg/dL)   Hgb urine dipstick TRACE (*) NEGATIVE    Bilirubin Urine NEGATIVE  NEGATIVE    Ketones, ur NEGATIVE  NEGATIVE (mg/dL)   Protein, ur NEGATIVE  NEGATIVE (mg/dL)   Urobilinogen, UA 2.0 (*) 0.0 - 1.0 (mg/dL)   Nitrite NEGATIVE  NEGATIVE    Leukocytes, UA TRACE (*) NEGATIVE   CARDIAC PANEL(CRET KIN+CKTOT+MB+TROPI)      Component Value Range   Total CK 207  7 - 232 (U/L)   CK, MB 3.6  0.3 - 4.0 (ng/mL)   Troponin I <0.30  <0.30 (ng/mL)   Relative Index 1.7  0.0 - 2.5   HEPATIC FUNCTION PANEL      Component Value Range   Total Protein 7.1  6.0 - 8.3 (g/dL)   Albumin 3.7  3.5 - 5.2 (g/dL)   AST 18  0 - 37 (U/L)   ALT 22  0 - 53 (U/L)   Alkaline Phosphatase 37 (*) 39 - 117 (U/L)   Total  Bilirubin 0.5  0.3 - 1.2 (mg/dL)   Bilirubin, Direct <1.6  0.0 - 0.3 (mg/dL)   Indirect Bilirubin NOT CALCULATED  0.3 - 0.9 (mg/dL)  URINE MICROSCOPIC-ADD ON      Component Value Range   WBC, UA 3-6  <3 (WBC/hpf)   RBC / HPF 3-6  <3 (RBC/hpf)   Urine-Other SPERM PRESENT     Ct Head Wo Contrast  10/09/2011  *RADIOLOGY REPORT*  Clinical Data: Dizziness, fall  CT HEAD WITHOUT CONTRAST  Technique:  Contiguous axial images were obtained from the base of the skull through the vertex without contrast.  Comparison: None.  Findings: Coarse calcification in the region of the falx posteriorly is unlikely to be clinically significant. No acute hemorrhage, acute infarction, or mass lesion is identified.  No midline shift.  No ventriculomegaly.  No skull fracture. Orbits and paranasal sinuses are intact.  IMPRESSION: No acute intracranial finding.  Original Report Authenticated By: Harrel Lemon, M.D.    6:15 PM  Abd Ultrasound still pending:   Will speak with pt's doctor after [place] results.    Larone Kliethermes A. Patrica Duel, MD 10/09/11 1815   6:25 PM  Requesting add'l pain meds. [place] still pending.   Javarius Tsosie A. Patrica Duel, MD 10/10/11 1102

## 2011-10-09 NOTE — ED Notes (Signed)
Pt. Stated, I'm swimmy headed for 2 months and today its worse.  i was sitting and then i fell back, and then i couldn't tell her what happen.  Took a shower and it was fine.  Went to run errands and then I almost fell again.

## 2011-10-10 ENCOUNTER — Other Ambulatory Visit: Payer: Self-pay

## 2011-10-10 LAB — CARDIAC PANEL(CRET KIN+CKTOT+MB+TROPI)
CK, MB: 3.4 ng/mL (ref 0.3–4.0)
CK, MB: 2.9 ng/mL (ref 0.3–4.0)
CK, MB: 3 ng/mL (ref 0.3–4.0)
Relative Index: 1.8 (ref 0.0–2.5)
Relative Index: 1.8 (ref 0.0–2.5)
Relative Index: 1.8 (ref 0.0–2.5)
Total CK: 194 U/L (ref 7–232)
Total CK: 160 U/L (ref 7–232)
Total CK: 171 U/L (ref 7–232)
Troponin I: <0.3 ng/mL (ref <0.30)
Troponin I: <0.3 ng/mL (ref <0.30)
Troponin I: <0.3 ng/mL (ref <0.30)

## 2011-10-10 LAB — CBC
HCT: 36.1 % — ABNORMAL LOW (ref 39.0–52.0)
HCT: 36.5 % — ABNORMAL LOW (ref 39.0–52.0)
Hemoglobin: 12.9 g/dL — ABNORMAL LOW (ref 13.0–17.0)
Hemoglobin: 13.4 g/dL (ref 13.0–17.0)
MCH: 27.8 pg (ref 26.0–34.0)
MCH: 28.6 pg (ref 26.0–34.0)
MCHC: 35.7 g/dL (ref 30.0–36.0)
MCHC: 36.7 g/dL — ABNORMAL HIGH (ref 30.0–36.0)
MCV: 77.8 fL — ABNORMAL LOW (ref 78.0–100.0)
MCV: 77.8 fL — ABNORMAL LOW (ref 78.0–100.0)
Platelets: 131 10*3/uL — ABNORMAL LOW (ref 150–400)
Platelets: 133 10*3/uL — ABNORMAL LOW (ref 150–400)
RBC: 4.64 MIL/uL (ref 4.22–5.81)
RBC: 4.69 MIL/uL (ref 4.22–5.81)
RDW: 16.8 % — ABNORMAL HIGH (ref 11.5–15.5)
RDW: 16.8 % — ABNORMAL HIGH (ref 11.5–15.5)
WBC: 6.6 10*3/uL (ref 4.0–10.5)
WBC: 7.3 10*3/uL (ref 4.0–10.5)

## 2011-10-10 LAB — GLUCOSE, CAPILLARY
Glucose-Capillary: 102 mg/dL — ABNORMAL HIGH (ref 70–99)
Glucose-Capillary: 170 mg/dL — ABNORMAL HIGH (ref 70–99)
Glucose-Capillary: 166 mg/dL — ABNORMAL HIGH (ref 70–99)
Glucose-Capillary: 134 mg/dL — ABNORMAL HIGH (ref 70–99)
Glucose-Capillary: 134 mg/dL — ABNORMAL HIGH (ref 70–99)
Glucose-Capillary: 160 mg/dL — ABNORMAL HIGH (ref 70–99)

## 2011-10-10 LAB — BASIC METABOLIC PANEL
BUN: 14 mg/dL (ref 6–23)
CO2: 27 mEq/L (ref 19–32)
Calcium: 9.2 mg/dL (ref 8.4–10.5)
Chloride: 104 mEq/L (ref 96–112)
Creatinine, Ser: 1.09 mg/dL (ref 0.50–1.35)
GFR calc Af Amer: 81 mL/min — ABNORMAL LOW (ref >90)
GFR calc non Af Amer: 70 mL/min — ABNORMAL LOW (ref >90)
Glucose, Bld: 109 mg/dL — ABNORMAL HIGH (ref 70–99)
Potassium: 3.8 mEq/L (ref 3.5–5.1)
Sodium: 140 mEq/L (ref 135–145)

## 2011-10-10 LAB — HEMOGLOBIN A1C
Hgb A1c MFr Bld: 6.5 % — ABNORMAL HIGH (ref <5.7)
Mean Plasma Glucose: 140 mg/dL — ABNORMAL HIGH (ref <117)

## 2011-10-10 LAB — PROTIME-INR
INR: 1.13 (ref 0.00–1.49)
Prothrombin Time: 14.7 seconds (ref 11.6–15.2)

## 2011-10-10 LAB — CREATININE, SERUM
Creatinine, Ser: 1.05 mg/dL (ref 0.50–1.35)
GFR calc Af Amer: 85 mL/min — ABNORMAL LOW (ref >90)
GFR calc non Af Amer: 73 mL/min — ABNORMAL LOW (ref >90)

## 2011-10-10 NOTE — Progress Notes (Signed)
Dr. Sharyn Lull updated of recent  Dizzy spells with noted EKG changes. Pt HR had gone down to 27 at its lowest. Pt also had a 5.5 second pause and numerous pauses 2-3 seconds. New order received to keep crashcart nearby. Will continue to  Monitor pt closely. BP 139/72, CBG = 170.

## 2011-10-10 NOTE — Progress Notes (Signed)
Subjective:  Had symptomatic 5 second pause earlier in the day. B-blocker and amiodarone held. Current heart rate is 89. Also awaiting surgical evaluation for possible GB surgery  Objective:  Vital Signs in the last 24 hours: Temp:  [97.8 F (36.6 C)-98.4 F (36.9 C)] 97.8 F (36.6 C) (02/03 1300) Pulse Rate:  [43-89] 89  (02/03 1300) Cardiac Rhythm:  [-] Normal sinus rhythm (02/03 0822) Resp:  [16-20] 20  (02/03 1300) BP: (132-194)/(65-93) 142/85 mmHg (02/03 1300) SpO2:  [92 %-97 %] 92 % (02/03 1300) Weight:  [124.4 kg (274 lb 4 oz)] 124.4 kg (274 lb 4 oz) (02/02 2300)  Physical Exam: BP Readings from Last 1 Encounters:  10/10/11 142/85    Wt Readings from Last 1 Encounters:  10/09/11 124.4 kg (274 lb 4 oz)    Weight change:   HEENT: [place]/AT, Eyes-Brown, PERL, EOMI, Conjunctiva-Pink, Sclera-Non-icteric Neck: No JVD, No bruit, Trachea midline. Lungs:  Clear, Bilateral. Cardiac:  Regular rhythm, normal S1 and S2, no S3.  Abdomen:  Soft, non-tender. Extremities:  No edema present. No cyanosis. No clubbing. CNS: AxOx3, Cranial nerves grossly intact, moves all 4 extremities. Right handed. Skin: Warm and dry.   Intake/Output from previous day: 02/02 0701 - 02/03 0700 In: -  Out: 525 [Urine:525]    Lab Results: BMET    Component Value Date/Time   NA 140 10/10/2011 0640   K 3.8 10/10/2011 0640   CL 104 10/10/2011 0640   CO2 27 10/10/2011 0640   GLUCOSE 109* 10/10/2011 0640   BUN 14 10/10/2011 0640   CREATININE 1.09 10/10/2011 0640   CALCIUM 9.2 10/10/2011 0640   GFRNONAA 70* 10/10/2011 0640   GFRAA 81* 10/10/2011 0640   CBC    Component Value Date/Time   WBC 6.6 10/10/2011 0640   RBC 4.64 10/10/2011 0640   HGB 12.9* 10/10/2011 0640   HCT 36.1* 10/10/2011 0640   PLT 131* 10/10/2011 0640   MCV 77.8* 10/10/2011 0640   MCH 27.8 10/10/2011 0640   MCHC 35.7 10/10/2011 0640   RDW 16.8* 10/10/2011 0640   CARDIAC ENZYMES Lab Results  Component Value Date   CKTOTAL 171 10/10/2011   CKMB 3.0 10/10/2011   TROPONINI <0.30 10/10/2011    Assessment/Plan:  Patient Active Hospital Problem List:  Syncope  Positional vertigo  Possible UTI  Abdominal pain  GB stones  CAD  DM, II  Obesity   Admit  Monitor rhythm.  Monitor sugars.  Add Antivert.  CK/MB-normal Hold amiodarone and B-blocker for now.  May need Pacemaker if decreasing dose of amiodarone and B-blocker do not work. Surgical consult IP or OP per Dr. Sharyn Lull    LOS: 1 day    Orpah Cobb  MD  10/10/2011, 8:06 PM

## 2011-10-10 NOTE — Progress Notes (Signed)
Pt. Decided to wear cpap. Pt. Placed on 8cmh20 with nasal mask. Pt. Tolerating well.

## 2011-10-10 NOTE — Progress Notes (Signed)
Pt. Refused cpap for tonight. Pt. States he would just rather wear oxygen.

## 2011-10-11 ENCOUNTER — Other Ambulatory Visit: Payer: Self-pay

## 2011-10-11 ENCOUNTER — Ambulatory Visit (INDEPENDENT_AMBULATORY_CARE_PROVIDER_SITE_OTHER): Payer: 59 | Admitting: Surgery

## 2011-10-11 LAB — CBC
HCT: 37.4 % — ABNORMAL LOW (ref 39.0–52.0)
Hemoglobin: 13.3 g/dL (ref 13.0–17.0)
MCH: 28.1 pg (ref 26.0–34.0)
MCHC: 35.6 g/dL (ref 30.0–36.0)
MCV: 79.1 fL (ref 78.0–100.0)
Platelets: 123 10*3/uL — ABNORMAL LOW (ref 150–400)
RBC: 4.73 MIL/uL (ref 4.22–5.81)
RDW: 17 % — ABNORMAL HIGH (ref 11.5–15.5)
WBC: 7.2 10*3/uL (ref 4.0–10.5)

## 2011-10-11 LAB — TSH: TSH: 1.432 u[IU]/mL (ref 0.350–4.500)

## 2011-10-11 LAB — BASIC METABOLIC PANEL
BUN: 18 mg/dL (ref 6–23)
CO2: 28 mEq/L (ref 19–32)
Calcium: 9.6 mg/dL (ref 8.4–10.5)
Chloride: 101 mEq/L (ref 96–112)
Creatinine, Ser: 1.23 mg/dL (ref 0.50–1.35)
GFR calc Af Amer: 70 mL/min — ABNORMAL LOW (ref >90)
GFR calc non Af Amer: 60 mL/min — ABNORMAL LOW (ref >90)
Glucose, Bld: 124 mg/dL — ABNORMAL HIGH (ref 70–99)
Potassium: 3.7 mEq/L (ref 3.5–5.1)
Sodium: 137 mEq/L (ref 135–145)

## 2011-10-11 LAB — GLUCOSE, CAPILLARY
Glucose-Capillary: 129 mg/dL — ABNORMAL HIGH (ref 70–99)
Glucose-Capillary: 140 mg/dL — ABNORMAL HIGH (ref 70–99)
Glucose-Capillary: 145 mg/dL — ABNORMAL HIGH (ref 70–99)
Glucose-Capillary: 157 mg/dL — ABNORMAL HIGH (ref 70–99)

## 2011-10-11 NOTE — Progress Notes (Signed)
Subjective:  Patient denies any chest pain or shortness of breath No further episodes of syncope patient had occasional pause up to 2.6 her second now beta blockers and amiodarone are on hold Patient didn't had pause above 5 seconds yesterday no further pauses above 2.6 second Patient noncompliant to CPAP  Objective:  Vital Signs in the last 24 hours: Temp:  [97.3 F (36.3 C)-98.3 F (36.8 C)] 98.3 F (36.8 C) (02/04 0500) Pulse Rate:  [54-89] 54  (02/04 0500) Resp:  [18-20] 18  (02/04 0500) BP: (124-146)/(67-85) 124/67 mmHg (02/04 0500) SpO2:  [92 %-97 %] 92 % (02/04 0500) Weight:  [121.5 kg (267 lb 13.7 oz)] 121.5 kg (267 lb 13.7 oz) (02/04 0500)  Intake/Output from previous day: 02/03 0701 - 02/04 0700 In: 360 [P.O.:360] Out: 3025 [Urine:3025] Intake/Output from this shift:    Physical Exam: General appearance: alert and cooperative Neck: no adenopathy, no carotid bruit, no JVD and supple, symmetrical, trachea midline Lungs: clear to auscultation bilaterally Heart: regular rate and rhythm, S1, S2 normal, no murmur, click, rub or gallop Abdomen: soft, non-tender; bowel sounds normal; no masses,  no organomegaly Extremities: extremities normal, atraumatic, no cyanosis or edema  Lab Results:  Basename 10/11/11 0540 10/10/11 0640  WBC 7.2 6.6  HGB 13.3 12.9*  PLT 123* 131*    Basename 10/11/11 0540 10/10/11 0640  NA 137 140  K 3.7 3.8  CL 101 104  CO2 28 27  GLUCOSE 124* 109*  BUN 18 14  CREATININE 1.23 1.09    Basename 10/10/11 1500 10/10/11 0948  TROPONINI <0.30 <0.30   Hepatic Function Panel  Basename 10/09/11 1528  PROT 7.1  ALBUMIN 3.7  AST 18  ALT 22  ALKPHOS 37*  BILITOT 0.5  BILIDIR <0.1  IBILI NOT CALCULATED   No results found for this basename: CHOL in the last 72 hours No results found for this basename: PROTIME in the last 72 hours  Imaging: Ct Head Wo Contrast  10/09/2011  *RADIOLOGY REPORT*  Clinical Data: Dizziness, fall  CT HEAD  WITHOUT CONTRAST  Technique:  Contiguous axial images were obtained from the base of the skull through the vertex without contrast.  Comparison: None.  Findings: Coarse calcification in the region of the falx posteriorly is unlikely to be clinically significant. No acute hemorrhage, acute infarction, or mass lesion is identified.  No midline shift.  No ventriculomegaly.  No skull fracture. Orbits and paranasal sinuses are intact.  IMPRESSION: No acute intracranial finding.  Original Report Authenticated By: Harrel Lemon, M.D.   US Abdomen Complete  10/09/2011  *RADIOLOGY REPORT*  Clinical Data:  Right upper quadrant abdominal pain.  COMPLETE ABDOMINAL ULTRASOUND  Comparison:  CT abdomen and pelvis without contrast 09/30/2011.  CT abdomen and pelvis without and with contrast 06/18/2010 at [facility].  Findings:  Gallbladder:  Multiple shadowing gallstones are present.  There is no wall thickening.  No sonographic Murphy's sign is evident peri  Common bile duct:  Normal in caliber. No biliary ductal dilation. The maximal diameter is 4.1 mm, within normal limits.  Liver:  The liver is heterogeneous.  No focal lesion is evident. There is loss of internal echo texture, suggesting fatty infiltration.  IVC:  The visualized portions are within normal limits.  Pancreas:  Although the pancreas is difficult to visualize in its entirety, no focal pancreatic abnormality is identified.  Spleen:  Normal size and echotexture without focal parenchymal abnormality.  The maximal length is 4.1 cm.  Right Kidney:  The right kidney cyst appears simple.  No other focal lesions are present.  The renal parenchyma and is otherwise within normal limits.  The maximal length is 13.2 cm.  Left Kidney:  No hydronephrosis.  Well-preserved cortex.  Normal size and parenchymal echotexture without focal abnormalities.   The maximal length is 11.7 cm peri  Abdominal aorta:  A the aorta is not well visualized due to abdominal gas.   IMPRESSION:  1.  Cholelithiasis without evidence for cholecystitis. 2.  Right renal cysts.  Original Report Authenticated By: Jamesetta Orleans. MATTERN, M.D.    Cardiac Studies:  Assessment/Plan:    LOS: 2 days  Status post syncope Status post symptomatic pauses secondary to meds Coronary artery disease stable Hypertension Non-insulin-dependent diabetes matters Morbid obesity Obesity hypoventilation syndrome/obstructive sleep apnea History of atrial tachyarrhythmias Possible UTI Cholelithiasis Plan Continue present management Increase ambulation possible discharge in a.m. if no further episodes of pauses  Matthew Ochoa N 10/11/2011, 12:24 PM

## 2011-10-11 NOTE — Progress Notes (Signed)
Patient's IV came out.  Dr. Algie Coffer notified.  May leave IV out until MD rounds this am, per Dr. Algie Coffer. Matthew Ochoa 10/11/11 7:00 AM

## 2011-10-11 NOTE — Progress Notes (Signed)
Patient to place himself on cpap and to call RT when ready.

## 2011-10-11 NOTE — Progress Notes (Signed)
Placed pt. On CPAP via nasal mask, 8.0 cm H20. Pt. Tolerating well at this time.

## 2011-10-11 NOTE — Progress Notes (Signed)
UR Completed. Simmons, Cristoval Teall F [phone]  

## 2011-10-12 ENCOUNTER — Other Ambulatory Visit: Payer: Self-pay

## 2011-10-12 LAB — GLUCOSE, CAPILLARY: Glucose-Capillary: 133 mg/dL — ABNORMAL HIGH (ref 70–99)

## 2011-10-12 MED ORDER — OXYCODONE-ACETAMINOPHEN 5-325 MG PO TABS: 1.0000 | ORAL_TABLET | Freq: Four times a day (QID) | ORAL | Status: AC | PRN

## 2011-10-12 MED ORDER — AMIODARONE HCL 200 MG PO TABS: 200.0000 mg | ORAL_TABLET | Freq: Every day | ORAL | Status: DC

## 2011-10-12 MED ORDER — CIPROFLOXACIN HCL 500 MG PO TABS: 500.0000 mg | ORAL_TABLET | Freq: Two times a day (BID) | ORAL | Status: AC

## 2011-10-12 MED ORDER — MECLIZINE HCL 12.5 MG PO TABS: 12.5000 mg | ORAL_TABLET | Freq: Three times a day (TID) | ORAL | Status: AC

## 2011-10-12 NOTE — Discharge Summary (Signed)
NAMEJAZIR, NEWEY NO.:  [id number]  MEDICAL RECORD NO.:  [id number]  LOCATION:  3714                         FACILITY:  [facility]  PHYSICIAN:  Eduardo Osier. Sharyn Lull, M.D. DATE OF BIRTH:  [birth year]  DATE OF ADMISSION:  10/09/2011 DATE OF DISCHARGE:  10/12/2011                              DISCHARGE SUMMARY   ADMITTING DIAGNOSES: 1. Syncope rule out cardiac arrhythmias. 2. Abdominal pain. 3. Positional vertigo. 4. Resolving urinary tract infection. 5. History of gallstones. 6. Coronary artery disease. 7. Diabetes mellitus. 8. Hypertension. 9. Morbid obesity.  FINAL DIAGNOSES: 1. Status post near syncope. 2. Atrial tachy arrhythmia. 3. Status post vertigo. 4. Coronary artery disease status post percutaneous coronary     intervention to right coronary artery in the past, stable. 5. Hypertension. 6. Type 2 diabetes mellitus. 7. Morbid obesity. 8. Obstructive sleep apnea. 9. Obesity hypoventilation syndrome. 10.Status post sinus pauses secondary to medications. 11.Cholelithiasis. 12.Resolving urinary tract infection on discharge.  DISCHARGE HOME MEDICATIONS: 1. Enteric-coated aspirin 81 mg 1 tablet daily. 2. Prasugrel 10 mg 1 tablet daily. 3. Amiodarone 200 mg 1 tablet daily. 4. Ciprofloxacin 500 mg twice daily for 5 more days. 5. Meclizine 12.5 mg 3 times daily as needed. 6. Combivent inhaler 2 puffs 4 times daily as needed. 7. Crestor 10 mg 1 tablet daily. 8. Omega-3 fatty acid 2 g daily. 9. Metformin 500 mg 1 tablet twice daily with meals. 10.Spiriva inhaler 18 mcg 1 inhalation daily. 11.Flomax 0.4 mg 1 capsule daily, 12.Micardis HCT 80/12.5 mg 1 tablet daily. 13.Percocet 5/325 mg 1-2 tablets every 6 hours as needed.  The patient has been advised to stop atenolol, metoprolol, cephalexin, Cialis, felodipine.  DIET:  Low salt, low cholesterol 1800 calories ADA diet.  The patient has been advised to monitor blood pressure and blood sugar daily in  chart.  Increase activity slowly as tolerated.  The patient has been advised to avoid fatty fried food.  CONDITION AT DISCHARGE:  Stable.  FOLLOWUP:  With me in 1 week and Dr. Willey Blade as scheduled.  BRIEF HISTORY AND HOSPITAL COURSE:  Mr. Mincey is a 66 year old black male with past medical history significant for multiple medical problems, i.e., coronary artery disease, history of PCI to RCA in the past, hypertension, type 2 diabetes mellitus, morbid obesity, obstructive sleep apnea, obesity hypoventilation syndrome, history of atrial tachyarrhythmias was admitted by Dr. Algie Coffer on October 09, 2011, because of questionable syncopal episode associated with dizziness for last 2 weeks.  He also complains of right upper quadrant pain without nausea, vomiting or diarrhea.  The patient denies any fever or chills. The patient states he is being treated for UTI with cephalexin.  PAST MEDICAL HISTORY:  As above.  PAST SURGICAL HISTORY:  None except for PTCA and stenting to RCA in August 2012.  SOCIAL HISTORY: He is married.  He used to smoke 1 pack per day for 30+ years quit approximately 8 months ago.  No history of alcohol abuse.  PHYSICAL EXAMINATION:  GENERAL:  He was alert, awake, oriented x3. VITAL SIGNS:  His blood pressure was 166/89, pulse was 84, he was afebrile. HEENT:  Pupils were equal, round, reactive to light. NECK:  Supple.  No JVD.  No bruit. CARDIOVASCULAR:  Regular rate and rhythm.  S1, S2 was normal.  There was no S3 gallop or S4 gallop. LUNGS:  He had expiratory faint wheezing.  No rales. ABDOMEN:  Soft.  Bowel sounds were normal.  There was mild tenderness to palpation in right upper quadrant.  There were no masses.  No rebound. NEURO:  Grossly intact.  LABORATORY DATA:  His sodium was 140, potassium 3.8, BUN 14, creatinine 1.09, glucose was 109.  Hemoglobin was 12.9, hematocrit 36.1, white count of 6.6.  His 3 sets of cardiac enzymes were negative.  CT of  the brain showed no acute intracranial findings.  Abdominal ultrasound showed cholelithiasis without evidence of cholecystitis, right renal cyst.  BRIEF HOSPITAL COURSE:  The patient was admitted to telemetry unit.  MI was ruled out by serial enzymes.  The patient did had episodes of sinus pauses up to 5 seconds.  His beta-blockers and amiodarone were held. The patient did not had any further significant pauses.  The patient did not had any arrhythmias except for frequent APCs during the hospital stay.  The patient has been ambulating in hallway without any problems. His abdominal pain has resolved.  The patient did not had any episodes of fever or chills.  The patient had drug-eluting stent placed to RCA in August 2012, approximately 6 months ago.  Abdominal ultrasound did show cholelithiasis but no evidence of cholecystitis.  The patient remained afebrile during the hospital stay.  His beta-blockers have been stopped and amiodarone dose has been reduced to once daily.  The patient was felt not a surgical candidate at this point as the patient did not had any active cholecystitis, and also the patient is on dual antiplatelet medication.  The patient will be discharged home on the above medications and will be followed up in my office in 1 week and Dr. Willey Blade in 2 weeks as scheduled.     Eduardo Osier. Sharyn Lull, M.D.     [facility]/MEDQ  D:  10/12/2011  T:  10/12/2011  Job:  [id number]

## 2011-10-12 NOTE — Discharge Summary (Signed)
  Discharge summary dictated on 10/12/2011 dictation number is [phone]

## 2011-11-08 ENCOUNTER — Other Ambulatory Visit: Payer: Self-pay | Admitting: Cardiology

## 2011-12-06 ENCOUNTER — Ambulatory Visit (INDEPENDENT_AMBULATORY_CARE_PROVIDER_SITE_OTHER): Payer: 59 | Admitting: General Surgery

## 2011-12-06 ENCOUNTER — Telehealth: Payer: Self-pay | Admitting: Internal Medicine

## 2011-12-06 ENCOUNTER — Encounter (INDEPENDENT_AMBULATORY_CARE_PROVIDER_SITE_OTHER): Payer: Self-pay | Admitting: General Surgery

## 2011-12-06 MED ORDER — BENZONATATE 200 MG PO CAPS: 200.0000 mg | ORAL_CAPSULE | Freq: Four times a day (QID) | ORAL | Status: DC | PRN

## 2011-12-06 NOTE — Telephone Encounter (Signed)
Error.  Duplicate Message.  Antionette Fairy

## 2011-12-06 NOTE — Telephone Encounter (Signed)
Called spoke with patient, advised of CDY's recs.  Pt okay with this and verbalized his understanding.  rx sent to verified pharmacy.  Pt to keep 4.2.13 appt with TP.

## 2011-12-06 NOTE — Telephone Encounter (Signed)
Per CY-okay to give Benzonatate 200 mg #20 take 1 tablet every 6 hours prn cough no refills.

## 2011-12-06 NOTE — Telephone Encounter (Signed)
Called spoke with patient who c/o a "continuous" cough with white mucus x2 months, worse x1week.  Denies wheezing, SOB, tightness in chest, f/c/s.  Has been taking robitussin, otc cough syrup, mucinex, cough drops and finished round of augmentin bid x7 days by his cardiologist.  Last seen by CDY 12.28.12.  Pt requesting appt with CDY, no openings.  Scheduled to see TP 4.2.13 @ 3 pm but pt is also requesting something be called into his pharmacy by CDY.  Dr Maple Hudson please advise, thanks.  No Known Allergies - verified.  [facility] [address].

## 2011-12-07 ENCOUNTER — Ambulatory Visit (INDEPENDENT_AMBULATORY_CARE_PROVIDER_SITE_OTHER)
Admission: RE | Admit: 2011-12-07 | Discharge: 2011-12-07 | Disposition: A | Discharge status: Home / Self Care | Payer: Medicare Other | Source: Ambulatory Visit | Attending: Adult Health | Admitting: Adult Health

## 2011-12-07 ENCOUNTER — Ambulatory Visit (INDEPENDENT_AMBULATORY_CARE_PROVIDER_SITE_OTHER): Payer: Medicare Other | Admitting: Adult Health

## 2011-12-07 ENCOUNTER — Encounter: Payer: Self-pay | Admitting: Adult Health

## 2011-12-07 VITALS — BP 132/76 | HR 55 | Temp 99.1°F | Ht 71.0 in | Wt 272.2 lb

## 2011-12-07 DIAGNOSIS — J449 Chronic obstructive pulmonary disease, unspecified: Secondary | ICD-10-CM

## 2011-12-07 MED ORDER — PREDNISONE 10 MG PO TABS: ORAL_TABLET | ORAL | Status: DC

## 2011-12-07 MED ORDER — HYDROCODONE-HOMATROPINE 5-1.5 MG/5ML PO SYRP: 5.0000 mL | ORAL_SOLUTION | Freq: Four times a day (QID) | ORAL | Status: AC | PRN

## 2011-12-07 NOTE — Patient Instructions (Addendum)
Prednisone taper over next week.  Mucinex DM Twice daily  As needed  Cough/congestion  Tessalon Three times a day  As needed  Cough Hydromet 1-2 tsp every 4-6hr As needed if still coughing -may make you sleepy.  Prilosec 20mg  daily for 2 weeks  Zyrtec 10mg  At bedtime  For 2 weeks  I will call with xray results.  Please contact office for sooner follow up if symptoms do not improve or worsen or seek emergency care   Follow up Dr. Maple Hudson  In 4 weeks as planned and As needed

## 2011-12-07 NOTE — Assessment & Plan Note (Signed)
Exacerbation - slow to resolve with chronic cough   Plan:  Prednisone taper over next week.  Mucinex DM Twice daily  As needed  Cough/congestion  Tessalon Three times a day  As needed  Cough Hydromet 1-2 tsp every 4-6hr As needed if still coughing -may make you sleepy.  Prilosec 20mg  daily for 2 weeks  Zyrtec 10mg  At bedtime  For 2 weeks  I will call with xray results.  Please contact office for sooner follow up if symptoms do not improve or worsen or seek emergency care   Follow up Dr. Maple Hudson  In 4 weeks as planned and As needed

## 2011-12-07 NOTE — Progress Notes (Signed)
07/30/11- 31 yoM former smoker previously followed 2009 for OSA, and coming now at the request of his cardiologist, Dr. Sharyn Lull, to evaluate for dyspnea. PCP Dr [name]. He has been aware of shortness of breath for about 3 months without definite onset. He was noted to have irregular heartbeat and ultimately had catheterization and cardiac stent August 7, without infarction. He is now in cardiac rehabilitation. He notices dyspnea on exertion while walking. [place] for complaint of dyspnea and treated there with inhalers and prednisone. He feels stable now except that he remains more easily short of breath with brisk walking. He is comfortable sitting and supine. Has had flu shot. Denies cough, wheeze, chest pain, palpitation or swelling. He had smoked one pack per day for 30 years, quitting in June of 2012. Office PFT 11/05/2002 had recorded FVC 3500 (74%) FEV1 2800 (75%) FEV1/FVC 0.80 with no response to bronchodilator. Medical treatment has been for high blood pressure, atrial fibrillation, diabetes and obstructive sleep apnea. He denies history of heart failure or pulmonary embolism. OSA- NPSG 03/17/98- AHI 32/hr; weight was 270 lbs. he has worn CPAP with variable long-term compliance and control. He says sometimes the mask is "bothersome" because of postnasal drainage and he is evasive about whether he has been using it recently. Pressure had been set at 14.  09/03/11-  65 yoM former smoker with dyspnea, hx OSA/CPAP, rhinitis . PCP Dr [name]. He remains off of cigarettes after quitting in June of 2012. Had flu shot. Was coughing at night but Robitussin helped. At times he coughed until he retched. Denies history of GERD. Last chest x-ray was at [facility] within the last several months and he was told it was "good". He uses oxygen off and on, now but cough is improved. PFT-08/09/2011-mild obstructive airways disease with insignificant response to  bronchodilator, mild restriction, mild reduction of DLCO FEV1/FVC 0.65, DLCO 69%. TLC 71%. 6 minute walk test 08/09/2011-96% on room air to start, falling to 88% on room air by the end of his walk. Blood pressure rose to 170/110, pulse 112. After 2 minutes recovery oxygen saturation on room air at rest was 94%. He walk 420 m. We discussed significant hypertension and significant oxygen desaturation with exertion.   12/07/2011 Acute OV  Complains of continuous cough with white mucus, increased SOB x2+ months - worse x 1-2 weeks, almost makes him vomit.  Patient complains that he has a persistent cough over the last 2-3 months. He has been seen by his cardiologist recently and given antibiotic without much relief. Has taken several doses of Robitussin-DM and Mucinex without much relief. He was also, cough, and Tessalon, without any relief. Patient reports that he is no longer taking Spiriva because it did not help and his cough continued despite stopping Spiriva. He denies any hemoptysis, chest pain, orthopnea, PND, or leg swelling.  ROS:  Constitutional:   No  weight loss, night sweats,  Fevers, chills,  +fatigue, or  lassitude.  HEENT:   No headaches,  Difficulty swallowing,  Tooth/dental problems, or  Sore throat,                No sneezing, itching, ear ache,  +nasal congestion, post nasal drip,   CV:  No chest pain,  Orthopnea, PND, swelling in lower extremities, anasarca, dizziness, palpitations, syncope.   GI  No heartburn, indigestion, abdominal pain, nausea, vomiting, diarrhea, change in bowel habits, loss of appetite, bloody stools.   Resp:  ,  No coughing up of blood.    No chest wall deformity  Skin: no rash or lesions.  GU: no dysuria, change in color of urine, no urgency or frequency.  No flank pain, no hematuria   MS:  No joint pain or swelling.  No decreased range of motion.  No back pain.  Psych:  No change in mood or affect. No depression or anxiety.  No memory  loss.    Exam:  GEN: A/Ox3; pleasant , NAD, obese  HEENT:  [place]/AT,  EACs-clear, TMs-wnl, NOSE-clear drainage , THROAT-clear, no lesions, no postnasal drip or exudate noted.   NECK:  Supple w/ fair ROM; no JVD; normal carotid impulses w/o bruits; no thyromegaly or nodules palpated; no lymphadenopathy.  RESP  Coarse BS w/o, wheezes/ rales/ or rhonchi.no accessory muscle use, no dullness to percussion  CARD:  RRR, no m/r/g  , no peripheral edema, pulses intact, no cyanosis or clubbing.  GI:   Soft & nt; nml bowel sounds; no organomegaly or masses detected.  Musco: Warm bil, no deformities or joint swelling noted.   Neuro: alert, no focal deficits noted.    Skin: Warm, no lesions or rashes

## 2011-12-09 NOTE — Progress Notes (Signed)
Quick Note:  Called, spoke with pt. I informed him no sign of pna per TP. Advised he should cont with OV recs and call back or seek emergency care if sxs do not improve or worsen. He verbalized understanding of these results and recs and voiced no further questions/concerns at this time. ______

## 2011-12-10 ENCOUNTER — Telehealth: Payer: Self-pay | Admitting: Internal Medicine

## 2011-12-10 MED ORDER — BENZONATATE 200 MG PO CAPS: 200.0000 mg | ORAL_CAPSULE | Freq: Four times a day (QID) | ORAL | Status: AC | PRN

## 2011-12-10 NOTE — Telephone Encounter (Signed)
lmomtcb x1--rx has been sent to the pharmacy

## 2011-12-10 NOTE — Telephone Encounter (Signed)
I spoke with pt and he is requesting a refill on his benzonatate. This was given to him on 12/06/11 #20 x 0 refills 1 q 6hrs prn. Pt states he has 3 tablets left. This has helped with his cough a lot and is wanting to know if he can have these to keep on hand. Since CDY is out of the office and pt saw tp on 12/07/11 will forward to her to see if it is okay to refill. Please advise TP thanks

## 2011-12-10 NOTE — Telephone Encounter (Signed)
Ad[facility] [address].  Pt stated there is nothing further needed at this time.  Matthew Ochoa

## 2011-12-10 NOTE — Telephone Encounter (Signed)
Yes can refill #45 same rx 3 refills

## 2011-12-21 ENCOUNTER — Encounter (INDEPENDENT_AMBULATORY_CARE_PROVIDER_SITE_OTHER): Payer: Self-pay | Admitting: General Surgery

## 2011-12-27 ENCOUNTER — Ambulatory Visit (INDEPENDENT_AMBULATORY_CARE_PROVIDER_SITE_OTHER): Payer: Medicare Other | Admitting: General Surgery

## 2011-12-27 ENCOUNTER — Other Ambulatory Visit: Payer: Self-pay | Admitting: Cardiology

## 2011-12-27 ENCOUNTER — Encounter (INDEPENDENT_AMBULATORY_CARE_PROVIDER_SITE_OTHER): Payer: Self-pay | Admitting: General Surgery

## 2011-12-27 DIAGNOSIS — K8020 Calculus of gallbladder without cholecystitis without obstruction: Secondary | ICD-10-CM

## 2011-12-27 NOTE — Progress Notes (Signed)
Subjective:     Patient ID: Matthew Ochoa., male   DOB: [birth year], 66 y.o.   MRN: [id number]  HPI We are asked to see the patient in consultation by Dr. Brunilda Payor to evaluate him for gallstones. The patient is a 66 year old white male who has been experiencing right-sided abdominal pain for the last 3-4 months. The pain radiates into his back. The pain has not been associated with nausea or vomiting. The pain will last for several hours when it starts. As part of his workup he underwent a CT scan which did show stones in his gallbladder but no gallbladder wall thickening or ductal dilatation.  Review of Systems  Constitutional: Negative.   HENT: Negative.   Eyes: Negative.   Respiratory: Positive for shortness of breath.   Cardiovascular: Positive for chest pain.  Gastrointestinal: Positive for abdominal pain.  Genitourinary: Negative.   Musculoskeletal: Negative.   Skin: Negative.   Neurological: Negative.   Hematological: Negative.   Psychiatric/Behavioral: Negative.        Objective:   Physical Exam  Constitutional: He is oriented to person, place, and time. He appears well-developed and well-nourished.  HENT:  Head: Normocephalic and atraumatic.  Eyes: Conjunctivae and EOM are normal. Pupils are equal, round, and reactive to light.  Neck: Normal range of motion. Neck supple.  Cardiovascular: Normal rate, regular rhythm and normal heart sounds.   Pulmonary/Chest: Effort normal and breath sounds normal.  Abdominal: Soft. Bowel sounds are normal.       Mild right upper quadrant abdominal pain. no palpable mass.  Musculoskeletal: Normal range of motion.  Neurological: He is alert and oriented to person, place, and time.  Skin: Skin is warm and dry.  Psychiatric: He has a normal mood and affect. His behavior is normal.       Assessment:     The patient has what sounds like symptomatic gallstones. Because of the risk of further painful episodes and possible pancreatitis I think  he would benefit from having his gallbladder removed. Unfortunately he may be at high risk for surgery given the recent placement of cardiac stents and the fact that he is on blood thinners. I have discussed with him in detail the risks and benefits of the operation to remove the gallbladder as well as some of the technical aspects and he understands. He would like to wait and think about it. He will get the opinion of Dr. Sharyn Lull his heart doctor. We will plan for cardiac clearance    Plan:     Plan for cardiac clearance. We will plan to see him back in about 4 months unless he decides on surgery center and is cleared by Dr. Sharyn Lull

## 2011-12-27 NOTE — Patient Instructions (Signed)
Will get cardiac clearance from Dr. Sharyn Lull

## 2012-01-07 ENCOUNTER — Ambulatory Visit (INDEPENDENT_AMBULATORY_CARE_PROVIDER_SITE_OTHER): Payer: Medicare Other | Admitting: Internal Medicine

## 2012-01-07 ENCOUNTER — Encounter: Payer: Self-pay | Admitting: Internal Medicine

## 2012-01-07 VITALS — BP 126/84 | HR 51 | Ht 71.0 in | Wt 215.2 lb

## 2012-01-07 DIAGNOSIS — J209 Acute bronchitis, unspecified: Secondary | ICD-10-CM

## 2012-01-07 DIAGNOSIS — J449 Chronic obstructive pulmonary disease, unspecified: Secondary | ICD-10-CM

## 2012-01-07 DIAGNOSIS — J40 Bronchitis, not specified as acute or chronic: Secondary | ICD-10-CM

## 2012-01-07 DIAGNOSIS — G4733 Obstructive sleep apnea (adult) (pediatric): Secondary | ICD-10-CM

## 2012-01-07 MED ORDER — LEVALBUTEROL HCL 0.63 MG/3ML IN NEBU
0.6300 mg | INHALATION_SOLUTION | Freq: Once | RESPIRATORY_TRACT | Status: AC
  Administered 2012-01-07: 0.63 mg via RESPIRATORY_TRACT

## 2012-01-07 MED ORDER — METHYLPREDNISOLONE ACETATE 80 MG/ML IJ SUSP
80.0000 mg | Freq: Once | INTRAMUSCULAR | Status: AC
  Administered 2012-01-07: 80 mg via INTRAMUSCULAR

## 2012-01-11 ENCOUNTER — Encounter: Payer: Self-pay | Admitting: Internal Medicine

## 2012-01-11 DIAGNOSIS — J209 Acute bronchitis, unspecified: Secondary | ICD-10-CM | POA: Insufficient documentation

## 2012-01-11 NOTE — Assessment & Plan Note (Signed)
Continues CPAP compliance.

## 2012-01-11 NOTE — Patient Instructions (Signed)
Prescription doxycycline  Nebulizer Xopenex 0.63  Depo-Medrol 80 mg  Prescription tramadol for cough

## 2012-01-11 NOTE — Assessment & Plan Note (Signed)
Potentially an exacerbation of his COPD. Response to treatment will be important as a guide to whether he needs further workup. Plan-doxycycline, nebulizer Xopenex, Depo-Medrol, tramadol for cough

## 2012-01-11 NOTE — Assessment & Plan Note (Signed)
Treat his exacerbation of COPD.

## 2012-01-11 NOTE — Progress Notes (Signed)
07/30/11- 59 yoM former smoker previously followed 2009 for OSA, and coming now at the request of his cardiologist, Dr. Sharyn Lull, to evaluate for dyspnea. PCP Dr [name]. He has been aware of shortness of breath for about 3 months without definite onset. He was noted to have irregular heartbeat and ultimately had catheterization and cardiac stent August 7, without infarction. He is now in cardiac rehabilitation. He notices dyspnea on exertion while walking. [place] for complaint of dyspnea and treated there with inhalers and prednisone. He feels stable now except that he remains more easily short of breath with brisk walking. He is comfortable sitting and supine. Has had flu shot. Denies cough, wheeze, chest pain, palpitation or swelling. He had smoked one pack per day for 30 years, quitting in June of 2012. Office PFT 11/05/2002 had recorded FVC 3500 (74%) FEV1 2800 (75%) FEV1/FVC 0.80 with no response to bronchodilator. Medical treatment has been for high blood pressure, atrial fibrillation, diabetes and obstructive sleep apnea. He denies history of heart failure or pulmonary embolism. OSA- NPSG 03/17/98- AHI 32/hr; weight was 270 lbs. he has worn CPAP with variable long-term compliance and control. He says sometimes the mask is "bothersome" because of postnasal drainage and he is evasive about whether he has been using it recently. Pressure had been set at 14.  09/03/11-  65 yoM former smoker with dyspnea, hx OSA/CPAP, rhinitis . PCP Dr [name]. He remains off of cigarettes after quitting in June of 2012. Had flu shot. Was coughing at night but Robitussin helped. At times he coughed until he retched. Denies history of GERD. Last chest x-ray was at [facility] within the last several months and he was told it was "good". He uses oxygen off and on, now but cough is improved. PFT-08/09/2011-mild obstructive airways disease with insignificant response to  bronchodilator, mild restriction, mild reduction of DLCO FEV1/FVC 0.65, DLCO 69%. TLC 71%. 6 minute walk test 08/09/2011-96% on room air to start, falling to 88% on room air by the end of his walk. Blood pressure rose to 170/110, pulse 112. After 2 minutes recovery oxygen saturation on room air at rest was 94%. He walk 420 m. We discussed significant hypertension and significant oxygen desaturation with exertion.   12/07/2011 Acute OV  Complains of continuous cough with white mucus, increased SOB x2+ months - worse x 1-2 weeks, almost makes him vomit.  Patient complains that he has a persistent cough over the last 2-3 months. He has been seen by his cardiologist recently and given antibiotic without much relief. Has taken several doses of Robitussin-DM and Mucinex without much relief. He was also, cough, and Tessalon, without any relief. Patient reports that he is no longer taking Spiriva because it did not help and his cough continued despite stopping Spiriva. He denies any hemoptysis, chest pain, orthopnea, PND, or leg swelling.  01/07/12-65 yoM former smoker with dyspnea, hx OSA/CPAP, rhinitis . PCP Dr [name]. Complains of throat tickle with cough for 3 months. No definite onset. White phlegm. No wheeze. He was given a sinus medication, Mucinex, prednisone and benzonatate. These helped for 2 weeks. Always chewing a toothpick but denies aspiration.  ROS-see HPI Constitutional:   No-   weight loss, night sweats, fevers, chills, fatigue, lassitude. HEENT:   No-  headaches, difficulty swallowing, tooth/dental problems, sore throat,       No-  sneezing, itching, ear ache, nasal congestion, post nasal drip,  CV:  No-  chest pain, orthopnea, PND, swelling in lower extremities, anasarca, dizziness, palpitations Resp: No-   shortness of breath with exertion or at rest.              +  productive cough,  + non-productive cough,  No- coughing up of blood.              No-   change in color of mucus.  No-  wheezing.   Skin: No-   rash or lesions. GI:  No-   heartburn, indigestion, abdominal pain, nausea, vomiting,  GU: . MS:  No-   joint pain or swelling.  . Neuro-     nothing unusual Psych:  No- change in mood or affect. No depression or anxiety.  No memory loss.  OBJ- Physical Exam General- Alert, Oriented, Affect-appropriate, Distress- none acute Skin- rash-none, lesions- none, excoriation- none Lymphadenopathy- none Head- atraumatic            Eyes- Gross vision intact, PERRLA, conjunctivae and secretions clear            Ears- Hearing, canals-normal            Nose- Clear, no-Septal dev, mucus, polyps, erosion, perforation             Throat- Mallampati II , mucosa clear , drainage- none, tonsils- atrophic, chewing toothpick Neck- flexible , trachea midline, no stridor , thyroid nl, carotid no bruit Chest - symmetrical excursion , unlabored           Heart/CV- RRR , no murmur , no gallop  , no rub, nl s1 s2                           - JVD- none , edema- none, stasis changes- none, varices- none           Lung- clear to P&A, wheeze- none, cough- none , dullness-none, rub- none           Chest wall-  Abd-  Br/ Gen/ Rectal- Not done, not indicated Extrem- cyanosis- none, clubbing, none, atrophy- none, strength- nl Neuro- grossly intact to observation

## 2012-01-22 ENCOUNTER — Other Ambulatory Visit: Payer: Self-pay | Admitting: Cardiology

## 2012-01-24 ENCOUNTER — Telehealth: Payer: Self-pay | Admitting: Internal Medicine

## 2012-01-24 MED ORDER — TRAMADOL HCL 50 MG PO TABS: 50.0000 mg | ORAL_TABLET | Freq: Four times a day (QID) | ORAL | Status: DC | PRN

## 2012-01-24 NOTE — Telephone Encounter (Signed)
Spoke with pt. He states concerned about rx for tramadol, he picked up rx for this and read that it was for pain. I advised that it is, but also has been shown to reduce the urge to cough. Pt verbalized understanding and denied any further questions.

## 2012-01-24 NOTE — Telephone Encounter (Signed)
PER CY 1. Ok to fill tramadol 2. Ok to drop off cxr 3. Ok to put him in first available with cy No need to double book .

## 2012-01-24 NOTE — Telephone Encounter (Signed)
Called, spoke with pt who states the cough did resolve while on tramadol.  He finished this approx 1 wk ago and cough has returned.  States cough is prod with white mucus.  Reports he does have PND and c/o "insides hurting" from coughing but denies SOB, wheezing, or chest tightness.  He is concerned bc this has been going on for a while - cough resolves while on medication but then comes back.  He would like an OV today or Dr. Roxy Cedar recs.  Pls advise. Thank you.  Also, pt states he picked up a copy of cxr done in Feb at [facility] [address]  -- ? Does Dr. Maple Hudson want to see this?  Looks like pt had a CXR in April 2013 in our chart.    [facility]   No Known Allergies

## 2012-01-24 NOTE — Telephone Encounter (Signed)
Called, spoke with pt.  I informed of below per Dr. Maple Hudson.  States he will try to drop off cxr today, OV scheduled with Dr. Maple Hudson on February 28, 2012 at 3:30 pm - pt aware, and tramadol rx will be sent to [facility].  Pt verbalized understanding of these instructions and was advised to pls call back if anything is needed prior to appt.    Dr. Maple Hudson, I do not see tramadol on pt's current med list. Pls advise on how you would like script sent.  Thank you.

## 2012-02-14 ENCOUNTER — Other Ambulatory Visit: Payer: Self-pay | Admitting: Internal Medicine

## 2012-02-15 ENCOUNTER — Telehealth: Payer: Self-pay | Admitting: Internal Medicine

## 2012-02-15 NOTE — Telephone Encounter (Signed)
CY, please advise if okay to refill this for patient. Was given one time and next OV is 02-28-12 at 3:30pm. Thanks.

## 2012-02-15 NOTE — Telephone Encounter (Signed)
This was sent and left message that this has been taken care of.

## 2012-02-15 NOTE — Telephone Encounter (Signed)
Ok to refill tramadol till next ov.

## 2012-02-16 ENCOUNTER — Encounter (INDEPENDENT_AMBULATORY_CARE_PROVIDER_SITE_OTHER): Payer: Self-pay | Admitting: General Surgery

## 2012-02-22 ENCOUNTER — Encounter: Payer: Self-pay | Admitting: Internal Medicine

## 2012-02-22 ENCOUNTER — Ambulatory Visit (INDEPENDENT_AMBULATORY_CARE_PROVIDER_SITE_OTHER): Payer: Medicare Other | Admitting: Internal Medicine

## 2012-02-22 VITALS — BP 138/88 | HR 49 | Ht 71.0 in | Wt 267.6 lb

## 2012-02-22 DIAGNOSIS — J209 Acute bronchitis, unspecified: Secondary | ICD-10-CM

## 2012-02-22 DIAGNOSIS — G4733 Obstructive sleep apnea (adult) (pediatric): Secondary | ICD-10-CM

## 2012-02-22 MED ORDER — OMEPRAZOLE 20 MG PO CPDR: DELAYED_RELEASE_CAPSULE | ORAL | Status: DC

## 2012-02-22 NOTE — Patient Instructions (Addendum)
Ok to continue benzonatate as needed  Try taking an acid blocker like otc omeprazole 20 mg, twice daily before meals, for one month. We want to see if that reduces your tendency to cough and reduces your need for the benzonatate.  We will check with [facility] on your CPAP settings. I would like you to get back on your CPAP if possible.  We discussed Ramipril, which is well known cause of chronic cough  When you come back, we will see if we want to try treating your cough again as a form of bronchitis.

## 2012-02-22 NOTE — Progress Notes (Signed)
07/30/11- 55 yoM former smoker previously followed 2009 for OSA, and coming now at the request of his cardiologist, Dr. Sharyn Lull, to evaluate for dyspnea. PCP Dr [name]. He has been aware of shortness of breath for about 3 months without definite onset. He was noted to have irregular heartbeat and ultimately had catheterization and cardiac stent August 7, without infarction. He is now in cardiac rehabilitation. He notices dyspnea on exertion while walking. [place] for complaint of dyspnea and treated there with inhalers and prednisone. He feels stable now except that he remains more easily short of breath with brisk walking. He is comfortable sitting and supine. Has had flu shot. Denies cough, wheeze, chest pain, palpitation or swelling. He had smoked one pack per day for 30 years, quitting in June of 2012. Office PFT 11/05/2002 had recorded FVC 3500 (74%) FEV1 2800 (75%) FEV1/FVC 0.80 with no response to bronchodilator. Medical treatment has been for high blood pressure, atrial fibrillation, diabetes and obstructive sleep apnea. He denies history of heart failure or pulmonary embolism. OSA- NPSG 03/17/98- AHI 32/hr; weight was 270 lbs. he has worn CPAP with variable long-term compliance and control. He says sometimes the mask is "bothersome" because of postnasal drainage and he is evasive about whether he has been using it recently. Pressure had been set at 14.  09/03/11-  65 yoM former smoker with dyspnea, hx OSA/CPAP, rhinitis . PCP Dr [name]. He remains off of cigarettes after quitting in June of 2012. Had flu shot. Was coughing at night but Robitussin helped. At times he coughed until he retched. Denies history of GERD. Last chest x-ray was at [facility] within the last several months and he was told it was "good". He uses oxygen off and on, now but cough is improved. PFT-08/09/2011-mild obstructive airways disease with insignificant response to  bronchodilator, mild restriction, mild reduction of DLCO FEV1/FVC 0.65, DLCO 69%. TLC 71%. 6 minute walk test 08/09/2011-96% on room air to start, falling to 88% on room air by the end of his walk. Blood pressure rose to 170/110, pulse 112. After 2 minutes recovery oxygen saturation on room air at rest was 94%. He walk 420 m. We discussed significant hypertension and significant oxygen desaturation with exertion.   12/07/2011 Acute OV  Complains of continuous cough with white mucus, increased SOB x2+ months - worse x 1-2 weeks, almost makes him vomit.  Patient complains that he has a persistent cough over the last 2-3 months. He has been seen by his cardiologist recently and given antibiotic without much relief. Has taken several doses of Robitussin-DM and Mucinex without much relief. He was also, cough, and Tessalon, without any relief. Patient reports that he is no longer taking Spiriva because it did not help and his cough continued despite stopping Spiriva. He denies any hemoptysis, chest pain, orthopnea, PND, or leg swelling.  01/07/12-65 yoM former smoker with dyspnea, hx OSA/CPAP, rhinitis . PCP Dr [name]. Complains of throat tickle with cough for 3 months. No definite onset. White phlegm. No wheeze. He was given a sinus medication, Mucinex, prednisone and benzonatate. These helped for 2 weeks. Always chewing a toothpick but denies aspiration.  02/22/12- 8 yoM former smoker with dyspnea, hx OSA, rhinitis, complicated by DM, AFib/ amiodarone . PCP Dr [name]. Does not wear CPAP 14/ [facility]-"makes cough worse" I discussed his ACE inhibitor Ramipril. He had already discussed this with his cardiologist but he insists he was coughing about the same long  before this drug was started. Benzonatate works well. CXR 12/09/11-reviewed Findings: The heart size and pulmonary vascularity are normal.  Lungs are clear. Calcified lymph nodes in the aortopulmonary  window, unchanged. No acute osseous abnormality.    IMPRESSION:  No acute abnormalities.  Original Report Authenticated By: Gwynn Burly, M.D.   ROS-see HPI Constitutional:   No-   weight loss, night sweats, fevers, chills, fatigue, lassitude. HEENT:   No-  headaches, difficulty swallowing, tooth/dental problems, sore throat,       No-  sneezing, itching, ear ache, nasal congestion, post nasal drip,  CV:  No-   chest pain, orthopnea, PND, swelling in lower extremities, anasarca, dizziness, palpitations Resp: No-   shortness of breath with exertion or at rest.              +  productive cough,  + non-productive cough,  No- coughing up of blood.              No-   change in color of mucus.  No- wheezing.   Skin: No-   rash or lesions. GI:  No-   heartburn, indigestion, abdominal pain, nausea, vomiting,  GU: . MS:  No-   joint pain or swelling.  . Neuro-     nothing unusual Psych:  No- change in mood or affect. No depression or anxiety.  No memory loss.  OBJ- Physical Exam General- Alert, Oriented, Affect-appropriate, Distress- none acute, overweight, toothpick Skin- rash-none, lesions- none, excoriation- none Lymphadenopathy- none Head- atraumatic            Eyes- Gross vision intact, PERRLA, conjunctivae and secretions clear            Ears- Hearing, canals-normal            Nose- Clear, no-Septal dev, mucus, polyps, erosion, perforation             Throat- Mallampati II , mucosa clear , drainage- none, tonsils- atrophic, chewing toothpick Neck- flexible , trachea midline, no stridor , thyroid nl, carotid no bruit Chest - symmetrical excursion , unlabored           Heart/CV- RRR , no murmur , no gallop  , no rub, nl s1 s2                           - JVD- none , edema- none, stasis changes- none, varices- none           Lung- clear to P&A, wheeze- none, cough- none , dullness-none, rub- none           Chest wall-  Abd-  Br/ Gen/ Rectal- Not done, not indicated Extrem- cyanosis- none, clubbing, none, atrophy- none, strength-  nl Neuro- grossly intact to observation

## 2012-02-28 ENCOUNTER — Ambulatory Visit: Payer: Medicare Other | Admitting: Internal Medicine

## 2012-03-01 ENCOUNTER — Other Ambulatory Visit: Payer: Self-pay | Admitting: Internal Medicine

## 2012-03-02 ENCOUNTER — Other Ambulatory Visit: Payer: Self-pay | Admitting: Cardiology

## 2012-03-02 ENCOUNTER — Telehealth: Payer: Self-pay | Admitting: Internal Medicine

## 2012-03-02 MED ORDER — TRAMADOL HCL 50 MG PO TABS: 50.0000 mg | ORAL_TABLET | Freq: Four times a day (QID) | ORAL | Status: AC | PRN

## 2012-03-02 NOTE — Telephone Encounter (Signed)
Pt requesting rx   Tramadol 50 mg <> take 1 tablet every 6 hours prn pain. No Known Allergies Dr Maple Hudson is this ok to fill .  Thank you

## 2012-03-02 NOTE — Assessment & Plan Note (Signed)
Low-grade bronchitis with a cyclical cough component likely aggravated by his ACE inhibitor although he doesn't recognize it. We will try to break the cycle with cough suppression and acid blocker/omeprazole

## 2012-03-02 NOTE — Telephone Encounter (Signed)
Per CY-okay to refill as requested. Rx sent and Left message for patient that this has been sent.

## 2012-03-02 NOTE — Assessment & Plan Note (Signed)
Repeated cough is making CPAP difficult and he blames the CPAP for causing the cough although that is unlikely. We think his pressure is set at 14 but will confirm that with his DME company.

## 2012-03-23 ENCOUNTER — Telehealth: Payer: Self-pay | Admitting: Internal Medicine

## 2012-03-23 MED ORDER — TRAMADOL HCL 50 MG PO TABS: 50.0000 mg | ORAL_TABLET | Freq: Four times a day (QID) | ORAL | Status: AC | PRN

## 2012-03-23 NOTE — Telephone Encounter (Signed)
Pt aware that this time only sent to [facility].

## 2012-04-26 ENCOUNTER — Ambulatory Visit: Payer: Medicare Other | Admitting: Internal Medicine

## 2012-05-01 ENCOUNTER — Other Ambulatory Visit: Payer: Self-pay | Admitting: Cardiology

## 2012-05-01 ENCOUNTER — Other Ambulatory Visit: Payer: Self-pay | Admitting: Internal Medicine

## 2012-05-05 ENCOUNTER — Telehealth: Payer: Self-pay | Admitting: Internal Medicine

## 2012-05-05 MED ORDER — TRAMADOL HCL 50 MG PO TABS: 50.0000 mg | ORAL_TABLET | Freq: Four times a day (QID) | ORAL | Status: AC | PRN

## 2012-05-05 NOTE — Telephone Encounter (Signed)
Rx was refilled x 5

## 2012-05-05 NOTE — Telephone Encounter (Signed)
Per CY-okay to refill Tramadol x 5.

## 2012-05-05 NOTE — Telephone Encounter (Signed)
Pt calling requesting a refill of the tramadol and this be sent to his pharmacy.  CY please advise if ok to refill.  Thanks  Last ov--  02/22/2012 Next ov--  06/06/2012  No Known Allergies

## 2012-05-25 ENCOUNTER — Telehealth: Payer: Self-pay | Admitting: Internal Medicine

## 2012-05-25 DIAGNOSIS — G4733 Obstructive sleep apnea (adult) (pediatric): Secondary | ICD-10-CM

## 2012-05-25 NOTE — Telephone Encounter (Signed)
Ok to order CPAP mask and supplies, CPAP pressure 14 cwp, download for compliance Ok to send last ov.

## 2012-05-25 NOTE — Telephone Encounter (Signed)
Spoke with pt. He states needs new rx for CPAP supplies sent to [facility]- hose, water chamber and mask. He states that they need new rx for CPAP settings, compliance report and last ov note. Please advise if okay to send thanks!

## 2012-05-26 NOTE — Telephone Encounter (Signed)
Pt returned the call and he is aware that the order has been sent to [facility] for his cpap supplies.  Pt voiced his understanding of this and nothing further is needed.

## 2012-05-26 NOTE — Telephone Encounter (Signed)
Order placed as directed by CY to [facility] w/ authorization to send last ov note.  LMOM TCB on both contact numbers provided in message.

## 2012-05-31 ENCOUNTER — Ambulatory Visit (INDEPENDENT_AMBULATORY_CARE_PROVIDER_SITE_OTHER)
Admission: RE | Admit: 2012-05-31 | Discharge: 2012-05-31 | Disposition: A | Discharge status: Home / Self Care | Payer: Medicare Other | Source: Ambulatory Visit | Attending: Internal Medicine | Admitting: Internal Medicine

## 2012-05-31 ENCOUNTER — Telehealth: Payer: Self-pay | Admitting: Internal Medicine

## 2012-05-31 DIAGNOSIS — R059 Cough, unspecified: Secondary | ICD-10-CM

## 2012-05-31 DIAGNOSIS — R05 Cough: Secondary | ICD-10-CM

## 2012-05-31 MED ORDER — BENZONATATE 200 MG PO CAPS: 200.0000 mg | ORAL_CAPSULE | Freq: Three times a day (TID) | ORAL | Status: DC | PRN

## 2012-05-31 NOTE — Telephone Encounter (Signed)
We gave tessalon perles- ok to send Rx for # 30, 200 mg  Every 8 hours if needed for cough, with no refill until ov. Ok to order CXR for dx "cough" any time before next ov.

## 2012-05-31 NOTE — Telephone Encounter (Signed)
Called, spoke with pt.  States he has a dry cough that has been going on for a while.  Reports he is taking a "pill" but cannot remember the name that eases the cough as long as he is actually taking it.  When he stops taking the "pill" the cough comes back.  Also, states he does have some PND.  Denies increased SOB, wheezing, chest tightness, chest pain, or f/c/s.  He is requesting OV with CXR.  As Dr. Maple Hudson is off today, offered OV with Dr. Shelle Iron.  Pt declined as he only wants to see Dr. Maple Hudson.  He does have a pending OV with Dr. Maple Hudson on 06/06/12 that he forgot about and ok with waiting unitl this appt.  Dr. Maple Hudson, as pt is requesting cxr, pls advise if you are ok with pt having this prior to appt with you.  Thank you.

## 2012-05-31 NOTE — Telephone Encounter (Signed)
Pt aware of order for CXR and aware that RX has been sent to pharmacy.

## 2012-06-01 ENCOUNTER — Telehealth: Payer: Self-pay | Admitting: Internal Medicine

## 2012-06-01 NOTE — Telephone Encounter (Signed)
Spoke with pt. He states that the "cough pill" that he wanted was tramadol, not tessalon. He states that he still has some of the tramadol and will use this for now and call for refill if needed.

## 2012-06-06 ENCOUNTER — Ambulatory Visit (INDEPENDENT_AMBULATORY_CARE_PROVIDER_SITE_OTHER)
Admission: RE | Admit: 2012-06-06 | Discharge: 2012-06-06 | Disposition: A | Discharge status: Home / Self Care | Payer: Medicare Other | Source: Ambulatory Visit | Attending: Internal Medicine | Admitting: Internal Medicine

## 2012-06-06 ENCOUNTER — Encounter: Payer: Self-pay | Admitting: Internal Medicine

## 2012-06-06 ENCOUNTER — Ambulatory Visit (INDEPENDENT_AMBULATORY_CARE_PROVIDER_SITE_OTHER): Payer: Medicare Other | Admitting: Internal Medicine

## 2012-06-06 VITALS — BP 122/78 | HR 47 | Ht 71.0 in | Wt 267.4 lb

## 2012-06-06 DIAGNOSIS — G4733 Obstructive sleep apnea (adult) (pediatric): Secondary | ICD-10-CM

## 2012-06-06 DIAGNOSIS — J189 Pneumonia, unspecified organism: Secondary | ICD-10-CM

## 2012-06-06 DIAGNOSIS — J449 Chronic obstructive pulmonary disease, unspecified: Secondary | ICD-10-CM

## 2012-06-06 MED ORDER — TRAMADOL HCL 50 MG PO TABS: 50.0000 mg | ORAL_TABLET | Freq: Four times a day (QID) | ORAL | Status: DC | PRN

## 2012-06-06 NOTE — Progress Notes (Signed)
07/30/11- 55 yoM former smoker previously followed 2009 for OSA, and coming now at the request of his cardiologist, Dr. Sharyn Lull, to evaluate for dyspnea. PCP Dr [name]. He has been aware of shortness of breath for about 3 months without definite onset. He was noted to have irregular heartbeat and ultimately had catheterization and cardiac stent August 7, without infarction. He is now in cardiac rehabilitation. He notices dyspnea on exertion while walking. [place] for complaint of dyspnea and treated there with inhalers and prednisone. He feels stable now except that he remains more easily short of breath with brisk walking. He is comfortable sitting and supine. Has had flu shot. Denies cough, wheeze, chest pain, palpitation or swelling. He had smoked one pack per day for 30 years, quitting in June of 2012. Office PFT 11/05/2002 had recorded FVC 3500 (74%) FEV1 2800 (75%) FEV1/FVC 0.80 with no response to bronchodilator. Medical treatment has been for high blood pressure, atrial fibrillation, diabetes and obstructive sleep apnea. He denies history of heart failure or pulmonary embolism. OSA- NPSG 03/17/98- AHI 32/hr; weight was 270 lbs. he has worn CPAP with variable long-term compliance and control. He says sometimes the mask is "bothersome" because of postnasal drainage and he is evasive about whether he has been using it recently. Pressure had been set at 14.  09/03/11-  65 yoM former smoker with dyspnea, hx OSA/CPAP, rhinitis . PCP Dr [name]. He remains off of cigarettes after quitting in June of 2012. Had flu shot. Was coughing at night but Robitussin helped. At times he coughed until he retched. Denies history of GERD. Last chest x-ray was at [facility] within the last several months and he was told it was "good". He uses oxygen off and on, now but cough is improved. PFT-08/09/2011-mild obstructive airways disease with insignificant response to  bronchodilator, mild restriction, mild reduction of DLCO FEV1/FVC 0.65, DLCO 69%. TLC 71%. 6 minute walk test 08/09/2011-96% on room air to start, falling to 88% on room air by the end of his walk. Blood pressure rose to 170/110, pulse 112. After 2 minutes recovery oxygen saturation on room air at rest was 94%. He walk 420 m. We discussed significant hypertension and significant oxygen desaturation with exertion.   12/07/2011 Acute OV  Complains of continuous cough with white mucus, increased SOB x2+ months - worse x 1-2 weeks, almost makes him vomit.  Patient complains that he has a persistent cough over the last 2-3 months. He has been seen by his cardiologist recently and given antibiotic without much relief. Has taken several doses of Robitussin-DM and Mucinex without much relief. He was also, cough, and Tessalon, without any relief. Patient reports that he is no longer taking Spiriva because it did not help and his cough continued despite stopping Spiriva. He denies any hemoptysis, chest pain, orthopnea, PND, or leg swelling.  01/07/12-65 yoM former smoker with dyspnea, hx OSA/CPAP, rhinitis . PCP Dr [name]. Complains of throat tickle with cough for 3 months. No definite onset. White phlegm. No wheeze. He was given a sinus medication, Mucinex, prednisone and benzonatate. These helped for 2 weeks. Always chewing a toothpick but denies aspiration.  02/22/12- 8 yoM former smoker with dyspnea, hx OSA, rhinitis, complicated by DM, AFib/ amiodarone . PCP Dr [name]. Does not wear CPAP 14/ [employer]-"makes cough worse" I discussed his ACE inhibitor Ramipril. He had already discussed this with his cardiologist but he insists he was coughing about the same long  before this drug was started. Benzonatate works well. CXR 12/09/11-reviewed Findings: The heart size and pulmonary vascularity are normal.  Lungs are clear. Calcified lymph nodes in the aortopulmonary  window, unchanged. No acute osseous abnormality.    IMPRESSION:  No acute abnormalities.  Original Report Authenticated By: Gwynn Burly, M.D.   06/06/12- 71 yoM former smoker with dyspnea, hx OSA, rhinitis, complicated by DM, AFib/ amiodarone .  PCP Dr [name]. States he is wearing CPAP 14/ [employer] every night for approximately 3-4 hours; feels like nose feels funny at times from the pressure. Plan reduce CPAP to 12. Cough tends to come back, and usually not productive. We discussed workup. CT scan causes claustrophobia. Will get flu shot at work place. CXR 05/31/12- reviewed with him. IMPRESSION:  Question developing air space disease at the left lung base.  Original Report Authenticated By: Reyes Ivan, M.D.    ROS-see HPI Constitutional:   No-   weight loss, night sweats, fevers, chills, fatigue, lassitude. HEENT:   No-  headaches, difficulty swallowing, tooth/dental problems, sore throat,       No-  sneezing, itching, ear ache, +nasal congestion, post nasal drip,  CV:  No-   chest pain, orthopnea, PND, swelling in lower extremities, anasarca, dizziness, palpitations Resp: No-   shortness of breath with exertion or at rest.              +  productive cough,  + non-productive cough,  No- coughing up of blood.              No-   change in color of mucus.  No- wheezing.   Skin: No-   rash or lesions. GI:  No-   heartburn, indigestion, abdominal pain, nausea, vomiting,  GU: . MS:  No-   joint pain or swelling.  . Neuro-     nothing unusual Psych:  No- change in mood or affect. No depression or anxiety.  No memory loss.  OBJ- Physical Exam General- Alert, Oriented, Affect-appropriate, Distress- none acute, overweight,  Always a toothpick Skin- rash-none, lesions- none, excoriation- none Lymphadenopathy- none Head- atraumatic            Eyes- Gross vision intact, PERRLA, conjunctivae and secretions clear            Ears- Hearing, canals-normal            Nose- Clear, no-Septal dev, mucus, polyps, erosion, perforation              Throat- Mallampati II , mucosa clear , drainage- none, tonsils- atrophic, chewing toothpick Neck- flexible , trachea midline, no stridor , thyroid nl, carotid no bruit Chest - symmetrical excursion , unlabored           Heart/CV- RRR , no murmur , no gallop  , no rub, nl s1 s2                           - JVD- none , edema- none, stasis changes- none, varices- none           Lung- clear to P&A, wheeze- none, cough- none , dullness-none, rub- none           Chest wall-  Abd-  Br/ Gen/ Rectal- Not done, not indicated Extrem- cyanosis- none, clubbing, none, atrophy- none, strength- nl Neuro- grossly intact to observation

## 2012-06-06 NOTE — Patient Instructions (Addendum)
Order- DME Christoper Allegra- reduce CPAP to 12 cwp   Order- CXR dx  Pneumonia suspected left lower lobe

## 2012-06-09 NOTE — Progress Notes (Signed)
Quick Note:  Pt was made aware of this result at 06-06-2012 appt and no abx needed. ______

## 2012-06-09 NOTE — Progress Notes (Signed)
Quick Note:  Pt aware of results. ______ 

## 2012-06-14 NOTE — Assessment & Plan Note (Addendum)
Pressure is effective. He thinks CPAP contributes to nasal discomfort and we discussed mask and humidifier. Plan-try reducing pressure from 14-12. We discussed auto titration but he wasn't prepared to pay for it

## 2012-06-14 NOTE — Assessment & Plan Note (Signed)
Plan-chest x-ray for followup.

## 2012-07-01 ENCOUNTER — Inpatient Hospital Stay (HOSPITAL_COMMUNITY)
Admission: EM | Admit: 2012-07-01 | Discharge: 2012-07-07 | DRG: 176 | Disposition: A | Discharge status: Home / Self Care | Payer: Medicare Other | Attending: Internal Medicine | Admitting: Internal Medicine

## 2012-07-01 ENCOUNTER — Emergency Department (HOSPITAL_COMMUNITY): Payer: Medicare Other

## 2012-07-01 ENCOUNTER — Encounter (HOSPITAL_COMMUNITY): Payer: Self-pay | Admitting: Physical Medicine and Rehabilitation

## 2012-07-01 DIAGNOSIS — J309 Allergic rhinitis, unspecified: Secondary | ICD-10-CM | POA: Diagnosis present

## 2012-07-01 DIAGNOSIS — G4733 Obstructive sleep apnea (adult) (pediatric): Secondary | ICD-10-CM | POA: Diagnosis present

## 2012-07-01 DIAGNOSIS — R0600 Dyspnea, unspecified: Secondary | ICD-10-CM

## 2012-07-01 DIAGNOSIS — I495 Sick sinus syndrome: Secondary | ICD-10-CM | POA: Diagnosis present

## 2012-07-01 DIAGNOSIS — N401 Enlarged prostate with lower urinary tract symptoms: Secondary | ICD-10-CM | POA: Diagnosis present

## 2012-07-01 DIAGNOSIS — I2510 Atherosclerotic heart disease of native coronary artery without angina pectoris: Secondary | ICD-10-CM | POA: Diagnosis present

## 2012-07-01 DIAGNOSIS — E785 Hyperlipidemia, unspecified: Secondary | ICD-10-CM | POA: Diagnosis present

## 2012-07-01 DIAGNOSIS — Z87891 Personal history of nicotine dependence: Secondary | ICD-10-CM

## 2012-07-01 DIAGNOSIS — Z7982 Long term (current) use of aspirin: Secondary | ICD-10-CM

## 2012-07-01 DIAGNOSIS — Z9861 Coronary angioplasty status: Secondary | ICD-10-CM

## 2012-07-01 DIAGNOSIS — Z23 Encounter for immunization: Secondary | ICD-10-CM

## 2012-07-01 DIAGNOSIS — J449 Chronic obstructive pulmonary disease, unspecified: Secondary | ICD-10-CM | POA: Diagnosis present

## 2012-07-01 DIAGNOSIS — Z7902 Long term (current) use of antithrombotics/antiplatelets: Secondary | ICD-10-CM

## 2012-07-01 DIAGNOSIS — I471 Supraventricular tachycardia: Secondary | ICD-10-CM | POA: Diagnosis present

## 2012-07-01 DIAGNOSIS — R600 Localized edema: Secondary | ICD-10-CM | POA: Diagnosis present

## 2012-07-01 DIAGNOSIS — E119 Type 2 diabetes mellitus without complications: Secondary | ICD-10-CM

## 2012-07-01 DIAGNOSIS — Z79899 Other long term (current) drug therapy: Secondary | ICD-10-CM

## 2012-07-01 DIAGNOSIS — D696 Thrombocytopenia, unspecified: Secondary | ICD-10-CM | POA: Diagnosis present

## 2012-07-01 DIAGNOSIS — I2589 Other forms of chronic ischemic heart disease: Secondary | ICD-10-CM | POA: Diagnosis present

## 2012-07-01 DIAGNOSIS — I2699 Other pulmonary embolism without acute cor pulmonale: Secondary | ICD-10-CM

## 2012-07-01 DIAGNOSIS — I10 Essential (primary) hypertension: Secondary | ICD-10-CM | POA: Diagnosis present

## 2012-07-01 DIAGNOSIS — I824Y9 Acute embolism and thrombosis of unspecified deep veins of unspecified proximal lower extremity: Secondary | ICD-10-CM | POA: Diagnosis present

## 2012-07-01 DIAGNOSIS — J4489 Other specified chronic obstructive pulmonary disease: Secondary | ICD-10-CM | POA: Diagnosis present

## 2012-07-01 DIAGNOSIS — Z6838 Body mass index (BMI) 38.0-38.9, adult: Secondary | ICD-10-CM

## 2012-07-01 DIAGNOSIS — I4891 Unspecified atrial fibrillation: Secondary | ICD-10-CM | POA: Diagnosis present

## 2012-07-01 LAB — CBC WITH DIFFERENTIAL/PLATELET
Basophils Absolute: 0 10*3/uL (ref 0.0–0.1)
Basophils Relative: 0 % (ref 0–1)
Eosinophils Absolute: 0.3 10*3/uL (ref 0.0–0.7)
Eosinophils Relative: 4 % (ref 0–5)
HCT: 39.4 % (ref 39.0–52.0)
Hemoglobin: 14.2 g/dL (ref 13.0–17.0)
Lymphocytes Relative: 20 % (ref 12–46)
Lymphs Abs: 1.4 10*3/uL (ref 0.7–4.0)
MCH: 28.6 pg (ref 26.0–34.0)
MCHC: 36 g/dL (ref 30.0–36.0)
MCV: 79.4 fL (ref 78.0–100.0)
Monocytes Absolute: 0.5 10*3/uL (ref 0.1–1.0)
Monocytes Relative: 7 % (ref 3–12)
Neutro Abs: 4.9 10*3/uL (ref 1.7–7.7)
Neutrophils Relative %: 69 % (ref 43–77)
Platelets: 147 10*3/uL — ABNORMAL LOW (ref 150–400)
RBC: 4.96 MIL/uL (ref 4.22–5.81)
RDW: 15.8 % — ABNORMAL HIGH (ref 11.5–15.5)
WBC: 7.1 10*3/uL (ref 4.0–10.5)

## 2012-07-01 LAB — D-DIMER, QUANTITATIVE (NOT AT ARMC): D-Dimer, Quant: 7.3 ug/mL-FEU — ABNORMAL HIGH (ref 0.00–0.48)

## 2012-07-01 LAB — COMPREHENSIVE METABOLIC PANEL
ALT: 37 U/L (ref 0–53)
AST: 24 U/L (ref 0–37)
Albumin: 4 g/dL (ref 3.5–5.2)
Alkaline Phosphatase: 50 U/L (ref 39–117)
BUN: 18 mg/dL (ref 6–23)
CO2: 24 mEq/L (ref 19–32)
Calcium: 9.6 mg/dL (ref 8.4–10.5)
Chloride: 103 mEq/L (ref 96–112)
Creatinine, Ser: 1.09 mg/dL (ref 0.50–1.35)
GFR calc Af Amer: 80 mL/min — ABNORMAL LOW (ref >90)
GFR calc non Af Amer: 69 mL/min — ABNORMAL LOW (ref >90)
Glucose, Bld: 113 mg/dL — ABNORMAL HIGH (ref 70–99)
Potassium: 3.6 mEq/L (ref 3.5–5.1)
Sodium: 138 mEq/L (ref 135–145)
Total Bilirubin: 0.5 mg/dL (ref 0.3–1.2)
Total Protein: 7.4 g/dL (ref 6.0–8.3)

## 2012-07-01 LAB — POCT I-STAT TROPONIN I: Troponin i, poc: 0.02 ng/mL (ref 0.00–0.08)

## 2012-07-01 LAB — PRO B NATRIURETIC PEPTIDE: Pro B Natriuretic peptide (BNP): 124.7 pg/mL (ref 0–125)

## 2012-07-01 LAB — PROTIME-INR
INR: 1.05 (ref 0.00–1.49)
Prothrombin Time: 13.6 seconds (ref 11.6–15.2)

## 2012-07-01 LAB — APTT: aPTT: 28 seconds (ref 24–37)

## 2012-07-01 MED ORDER — LORAZEPAM 2 MG/ML IJ SOLN
1.0000 mg | Freq: Once | INTRAMUSCULAR | Status: AC
  Administered 2012-07-01: 1 mg via INTRAVENOUS
  Filled 2012-07-01: qty 1

## 2012-07-01 MED ORDER — SODIUM CHLORIDE 0.9 % IV SOLN
INTRAVENOUS | Status: DC
  Administered 2012-07-01: 21:00:00 via INTRAVENOUS
  Administered 2012-07-02: 125 mL/h via INTRAVENOUS

## 2012-07-01 MED ORDER — IOHEXOL 350 MG/ML SOLN
100.0000 mL | Freq: Once | INTRAVENOUS | Status: AC | PRN
  Administered 2012-07-01: 100 mL via INTRAVENOUS

## 2012-07-01 MED ORDER — HEPARIN SODIUM (PORCINE) 5000 UNIT/ML IJ SOLN: 60.0000 [IU]/kg | Freq: Once | INTRAMUSCULAR | Status: DC

## 2012-07-01 NOTE — ED Provider Notes (Signed)
History     CSN: [id number]  Arrival date & time 07/01/12  1650   First MD Initiated Contact with Patient 07/01/12 1912      Chief Complaint  Patient presents with  . Shortness of Breath  . Fatigue    (Consider location/radiation/quality/duration/timing/severity/associated sxs/prior treatment) Patient is a 66 y.o. male presenting with shortness of breath. The history is provided by the patient.  Shortness of Breath  Associated symptoms include shortness of breath.   patient here with shortness of breath on exertion x2 days. Some cough without fever. Denies any chest pain chest pressure. No diaphoresis. Denies any peripheral edema. Current symptoms are not like his prior anginal equivalent. He does have a history of CAD with coronary stent. He denies any prior history of CHF. No vomiting or diarrhea. No medications used prior to arrival  Past Medical History  Diagnosis Date  . Sleep apnea     NPSG 03-17-98, cpap 14  . Type 2 diabetes mellitus   . Hypertension   . COPD (chronic obstructive pulmonary disease)   . HTN (hypertension)   . Allergic rhinitis   . Abdominal pain, right upper quadrant   . Calculus of gallbladder without mention of cholecystitis or obstruction   . Benign localized hyperplasia of prostate with urinary obstruction and other lower urinary tract symptoms (LUTS)(600.21)   . Hematuria, unspecified   . Diabetes mellitus   . Hyperlipidemia     Past Surgical History  Procedure Date  . Angioplasty     stent  . Coronary angioplasty with stent placement     Family History  Problem Relation Age of Onset  . Asthma Sister   . Lung cancer Father     History  Substance Use Topics  . Smoking status: Former Smoker -- 1.0 packs/day for 30 years    Types: Cigarettes    Quit date: 04/13/2011  . Smokeless tobacco: Former Neurosurgeon    Quit date: 04/13/2011  . Alcohol Use: No     occ      Review of Systems  Respiratory: Positive for shortness of breath.   All  other systems reviewed and are negative.    Allergies  Review of patient's allergies indicates no known allergies.  Home Medications   Current Outpatient Rx  Name Route Sig Dispense Refill  . AMIODARONE HCL 200 MG PO TABS Oral Take 1 tablet (200 mg total) by mouth daily. 30 tablet 3  . ASPIRIN 81 MG PO TABS Oral Take 81 mg by mouth daily.      Marland Kitchen BENZONATATE 200 MG PO CAPS Oral Take 1 capsule (200 mg total) by mouth every 8 (eight) hours as needed for cough. 30 capsule 0  . CRESTOR 10 MG PO TABS Oral Take 1 tablet by mouth daily.    Marland Kitchen EFFIENT 10 MG PO TABS Oral Take 1 tablet by mouth daily.       Marland Kitchen METFORMIN HCL 500 MG PO TABS Oral Take 1 tablet by mouth 2 (two) times daily with a meal.     . NITROGLYCERIN 0.4 MG SL SUBL Sublingual Place 0.4 mg under the tongue every 5 (five) minutes as needed.    Marland Kitchen RAMIPRIL 10 MG PO CAPS Oral Take 10 mg by mouth daily.    . TELMISARTAN-HCTZ 80-12.5 MG PO TABS Oral Take 1 tablet by mouth daily.     . TRAMADOL HCL 50 MG PO TABS Oral Take 1 tablet (50 mg total) by mouth every 6 (six) hours as needed for  pain. 30 tablet 0  . COMBIVENT 18-103 MCG/ACT IN AERO Inhalation Inhale 2 puffs into the lungs 4 (four) times daily as needed. For shortness of breath    . SPIRIVA HANDIHALER 18 MCG IN CAPS Inhalation Place 18 mcg into inhaler and inhale daily.       BP 106/58  Pulse 108  Temp 98.2 F (36.8 C) (Oral)  Resp 18  SpO2 95%  Physical Exam  Nursing note and vitals reviewed. Constitutional: He is oriented to person, place, and time. Vital signs are normal. He appears well-developed and well-nourished.  Non-toxic appearance. No distress.  HENT:  Head: Normocephalic and atraumatic.  Eyes: Conjunctivae normal, EOM and lids are normal. Pupils are equal, round, and reactive to light.  Neck: Normal range of motion. Neck supple. No tracheal deviation present. No mass present.  Cardiovascular: Normal rate, regular rhythm and normal heart sounds.  Exam reveals no  gallop.   No murmur heard. Pulmonary/Chest: Effort normal and breath sounds normal. No stridor. No respiratory distress. He has no decreased breath sounds. He has no wheezes. He has no rhonchi. He has no rales.  Abdominal: Soft. Normal appearance and bowel sounds are normal. He exhibits no distension. There is no tenderness. There is no rebound and no CVA tenderness.  Musculoskeletal: Normal range of motion. He exhibits no edema and no tenderness.  Neurological: He is alert and oriented to person, place, and time. He has normal strength. No cranial nerve deficit or sensory deficit. GCS eye subscore is 4. GCS verbal subscore is 5. GCS motor subscore is 6.  Skin: Skin is warm and dry. No abrasion and no rash noted.  Psychiatric: He has a normal mood and affect. His speech is normal and behavior is normal.    ED Course  Procedures (including critical care time)  Labs Reviewed  CBC WITH DIFFERENTIAL - Abnormal; Notable for the following:    RDW 15.8 (*)     Platelets 147 (*)     All other components within normal limits  COMPREHENSIVE METABOLIC PANEL - Abnormal; Notable for the following:    Glucose, Bld 113 (*)     GFR calc non Af Amer 69 (*)     GFR calc Af Amer 80 (*)     All other components within normal limits   No results found.   No diagnosis found.    MDM  Patient with chest CT that was positive for multiple pulmonary emboli. Heparin to be started by pharmacy and patient be admitted        Toy Baker, MD 07/01/12 [phone]

## 2012-07-01 NOTE — Progress Notes (Signed)
ANTICOAGULATION CONSULT NOTE - Initial Consult  Pharmacy Consult for heparin Indication: pulmonary embolus  No Known Allergies  Patient Measurements:   Heparin Dosing Weight: 100 kg  Vital Signs: Temp: 98.2 F (36.8 C) (10/26 1716) Temp src: Oral (10/26 1716) BP: 132/95 mmHg (10/26 2223) Pulse Rate: 68  (10/26 2223)  Labs:  Basename 07/01/12 2341 07/01/12 1717  HGB -- 14.2  HCT -- 39.4  PLT -- 147*  APTT 28 --  LABPROT 13.6 --  INR 1.05 --  HEPARINUNFRC -- --  CREATININE -- 1.09  CKTOTAL -- --  CKMB -- --  TROPONINI -- --    The CrCl is unknown because both a height and weight (above a minimum accepted value) are required for this calculation.   Medical History: Past Medical History  Diagnosis Date  . Sleep apnea     NPSG 03-17-98, cpap 14  . Type 2 diabetes mellitus   . Hypertension   . COPD (chronic obstructive pulmonary disease)   . HTN (hypertension)   . Allergic rhinitis   . Abdominal pain, right upper quadrant   . Calculus of gallbladder without mention of cholecystitis or obstruction   . Benign localized hyperplasia of prostate with urinary obstruction and other lower urinary tract symptoms (LUTS)(600.21)   . Hematuria, unspecified   . Diabetes mellitus   . Hyperlipidemia     Medications:  Scheduled:    . LORazepam  1 mg Intravenous Once  . DISCONTD: heparin  60 Units/kg Intravenous Once   Infusions:    . sodium chloride 125 mL/hr at 07/01/12 2040    Assessment: 66 yo male with confirmed PE will be started on heparin therapy.  Baseline H/H 14.2/39.4; Plt 147.  Goal of Therapy:  Heparin level 0.3-0.7 units/ml Monitor platelets by anticoagulation protocol: Yes   Plan:  1) Heparin 5000 units iv bolus x1, then start heparin drip at 1700 units/hr. 2) Check a 6hr heparin level 3) Daily heparin level and CBC 4) F/u on starting oral anticoagulant  Matthew Ochoa, Matthew Ochoa 07/01/2012,11:58 PM

## 2012-07-01 NOTE — ED Notes (Signed)
Pt presents to department for evaluation of SOB and weakness bilateral legs. Onset this morning. Pt states "I don't feel like myself." states he becomes very short of breath on exertion. Also states his legs feel very tired. No neurological deficits noted. Able to move all extremities. Respirations unlabored. Speaking complete sentences. Pt is alert and oriented x4. NAD.

## 2012-07-02 DIAGNOSIS — R600 Localized edema: Secondary | ICD-10-CM | POA: Diagnosis present

## 2012-07-02 DIAGNOSIS — I2699 Other pulmonary embolism without acute cor pulmonale: Principal | ICD-10-CM

## 2012-07-02 DIAGNOSIS — I471 Supraventricular tachycardia: Secondary | ICD-10-CM | POA: Diagnosis present

## 2012-07-02 DIAGNOSIS — E785 Hyperlipidemia, unspecified: Secondary | ICD-10-CM | POA: Diagnosis present

## 2012-07-02 DIAGNOSIS — R6889 Other general symptoms and signs: Secondary | ICD-10-CM

## 2012-07-02 DIAGNOSIS — I2510 Atherosclerotic heart disease of native coronary artery without angina pectoris: Secondary | ICD-10-CM | POA: Diagnosis present

## 2012-07-02 DIAGNOSIS — M7989 Other specified soft tissue disorders: Secondary | ICD-10-CM

## 2012-07-02 DIAGNOSIS — R0602 Shortness of breath: Secondary | ICD-10-CM

## 2012-07-02 LAB — DIC (DISSEMINATED INTRAVASCULAR COAGULATION)PANEL
Fibrinogen: 313 mg/dL (ref 204–475)
Prothrombin Time: 14.3 seconds (ref 11.6–15.2)
Smear Review: NONE SEEN
aPTT: 29 seconds (ref 24–37)

## 2012-07-02 LAB — GLUCOSE, CAPILLARY
Glucose-Capillary: 138 mg/dL — ABNORMAL HIGH (ref 70–99)
Glucose-Capillary: 149 mg/dL — ABNORMAL HIGH (ref 70–99)
Glucose-Capillary: 113 mg/dL — ABNORMAL HIGH (ref 70–99)
Glucose-Capillary: 125 mg/dL — ABNORMAL HIGH (ref 70–99)

## 2012-07-02 LAB — DIC (DISSEMINATED INTRAVASCULAR COAGULATION) PANEL (NOT AT ARMC)
D-Dimer, Quant: 7.09 ug/mL-FEU — ABNORMAL HIGH (ref 0.00–0.48)
INR: 1.13 (ref 0.00–1.49)
Platelets: 137 10*3/uL — ABNORMAL LOW (ref 150–400)

## 2012-07-02 LAB — BASIC METABOLIC PANEL
BUN: 14 mg/dL (ref 6–23)
CO2: 25 mEq/L (ref 19–32)
Calcium: 8.8 mg/dL (ref 8.4–10.5)
Chloride: 106 mEq/L (ref 96–112)
Creatinine, Ser: 1.06 mg/dL (ref 0.50–1.35)
GFR calc Af Amer: 83 mL/min — ABNORMAL LOW (ref >90)
GFR calc non Af Amer: 72 mL/min — ABNORMAL LOW (ref >90)
Glucose, Bld: 104 mg/dL — ABNORMAL HIGH (ref 70–99)
Potassium: 3.5 mEq/L (ref 3.5–5.1)
Sodium: 140 mEq/L (ref 135–145)

## 2012-07-02 LAB — CBC
HCT: 37.7 % — ABNORMAL LOW (ref 39.0–52.0)
Hemoglobin: 13.7 g/dL (ref 13.0–17.0)
MCH: 28.8 pg (ref 26.0–34.0)
MCHC: 36.3 g/dL — ABNORMAL HIGH (ref 30.0–36.0)
MCV: 79.2 fL (ref 78.0–100.0)
Platelets: 134 10*3/uL — ABNORMAL LOW (ref 150–400)
RBC: 4.76 MIL/uL (ref 4.22–5.81)
RDW: 15.5 % (ref 11.5–15.5)
WBC: 7.4 10*3/uL (ref 4.0–10.5)

## 2012-07-02 LAB — HEPARIN LEVEL (UNFRACTIONATED): Heparin Unfractionated: 0.61 IU/mL (ref 0.30–0.70)

## 2012-07-02 MED ORDER — OXYCODONE HCL 5 MG PO TABS
5.0000 mg | ORAL_TABLET | ORAL | Status: DC | PRN
  Administered 2012-07-04 – 2012-07-05 (×2): 5 mg via ORAL
  Filled 2012-07-02 (×2): qty 1

## 2012-07-02 MED ORDER — TRAMADOL HCL 50 MG PO TABS
50.0000 mg | ORAL_TABLET | Freq: Four times a day (QID) | ORAL | Status: DC | PRN
  Administered 2012-07-05 – 2012-07-06 (×4): 50 mg via ORAL
  Filled 2012-07-02 (×4): qty 1

## 2012-07-02 MED ORDER — ONDANSETRON HCL 4 MG PO TABS: 4.0000 mg | ORAL_TABLET | Freq: Four times a day (QID) | ORAL | Status: DC | PRN

## 2012-07-02 MED ORDER — SODIUM CHLORIDE 0.9 % IJ SOLN: 3.0000 mL | Freq: Two times a day (BID) | INTRAMUSCULAR | Status: DC

## 2012-07-02 MED ORDER — ASPIRIN EC 81 MG PO TBEC
81.0000 mg | DELAYED_RELEASE_TABLET | Freq: Every day | ORAL | Status: DC
  Administered 2012-07-02 – 2012-07-07 (×6): 81 mg via ORAL
  Filled 2012-07-02 (×6): qty 1

## 2012-07-02 MED ORDER — WARFARIN SODIUM 10 MG PO TABS
10.0000 mg | ORAL_TABLET | Freq: Once | ORAL | Status: AC
  Administered 2012-07-02: 10 mg via ORAL
  Filled 2012-07-02: qty 1

## 2012-07-02 MED ORDER — HEPARIN (PORCINE) IN NACL 100-0.45 UNIT/ML-% IJ SOLN
1700.0000 [IU]/h | INTRAMUSCULAR | Status: DC
  Filled 2012-07-02 (×5): qty 250

## 2012-07-02 MED ORDER — TELMISARTAN-HCTZ 80-12.5 MG PO TABS: 1.0000 | ORAL_TABLET | Freq: Every day | ORAL | Status: DC

## 2012-07-02 MED ORDER — ASPIRIN 81 MG PO TABS: 81.0000 mg | ORAL_TABLET | Freq: Every day | ORAL | Status: DC

## 2012-07-02 MED ORDER — SODIUM CHLORIDE 0.9 % IV SOLN: INTRAVENOUS | Status: DC

## 2012-07-02 MED ORDER — WARFARIN - PHARMACIST DOSING INPATIENT: Freq: Every day | Status: DC

## 2012-07-02 MED ORDER — RAMIPRIL 10 MG PO CAPS
10.0000 mg | ORAL_CAPSULE | Freq: Every day | ORAL | Status: DC
  Administered 2012-07-02 – 2012-07-07 (×6): 10 mg via ORAL
  Filled 2012-07-02 (×6): qty 1

## 2012-07-02 MED ORDER — ALPHA1-PROTEINASE INHIBITOR 1000 MG IV SOLR: 60.0000 mg/kg | INTRAVENOUS | Status: DC

## 2012-07-02 MED ORDER — ACETAMINOPHEN 325 MG PO TABS: 650.0000 mg | ORAL_TABLET | Freq: Four times a day (QID) | ORAL | Status: DC | PRN

## 2012-07-02 MED ORDER — PRASUGREL HCL 10 MG PO TABS
10.0000 mg | ORAL_TABLET | Freq: Every day | ORAL | Status: DC
  Administered 2012-07-02 – 2012-07-04 (×3): 10 mg via ORAL
  Filled 2012-07-02 (×3): qty 1

## 2012-07-02 MED ORDER — SODIUM CHLORIDE 0.9 % IV SOLN: INTRAVENOUS | Status: AC

## 2012-07-02 MED ORDER — IPRATROPIUM-ALBUTEROL 18-103 MCG/ACT IN AERO
2.0000 | INHALATION_SPRAY | Freq: Four times a day (QID) | RESPIRATORY_TRACT | Status: DC | PRN
  Filled 2012-07-02: qty 14.7

## 2012-07-02 MED ORDER — ZOLPIDEM TARTRATE 5 MG PO TABS: 5.0000 mg | ORAL_TABLET | Freq: Every evening | ORAL | Status: DC | PRN

## 2012-07-02 MED ORDER — HYDROMORPHONE HCL PF 1 MG/ML IJ SOLN: 0.5000 mg | INTRAMUSCULAR | Status: DC | PRN

## 2012-07-02 MED ORDER — TIOTROPIUM BROMIDE MONOHYDRATE 18 MCG IN CAPS
18.0000 ug | ORAL_CAPSULE | Freq: Every day | RESPIRATORY_TRACT | Status: DC
  Filled 2012-07-02: qty 5

## 2012-07-02 MED ORDER — ATORVASTATIN CALCIUM 20 MG PO TABS
20.0000 mg | ORAL_TABLET | Freq: Every day | ORAL | Status: DC
  Administered 2012-07-03 – 2012-07-06 (×4): 20 mg via ORAL
  Filled 2012-07-02 (×6): qty 1

## 2012-07-02 MED ORDER — BENZONATATE 100 MG PO CAPS
200.0000 mg | ORAL_CAPSULE | Freq: Three times a day (TID) | ORAL | Status: DC | PRN
Start: 2012-07-02 — End: 2012-07-07
  Filled 2012-07-02: qty 2

## 2012-07-02 MED ORDER — SODIUM CHLORIDE 0.9 % IV SOLN
INTRAVENOUS | Status: DC
  Administered 2012-07-02: 14:00:00 via INTRAVENOUS

## 2012-07-02 MED ORDER — HYDRALAZINE HCL 20 MG/ML IJ SOLN
5.0000 mg | Freq: Three times a day (TID) | INTRAMUSCULAR | Status: DC | PRN
  Administered 2012-07-02: 5 mg via INTRAVENOUS
  Filled 2012-07-02 (×3): qty 0.25

## 2012-07-02 MED ORDER — NITROGLYCERIN 0.4 MG SL SUBL: 0.4000 mg | SUBLINGUAL_TABLET | SUBLINGUAL | Status: DC | PRN

## 2012-07-02 MED ORDER — HYDROCHLOROTHIAZIDE 25 MG PO TABS
12.5000 mg | ORAL_TABLET | Freq: Every day | ORAL | Status: DC
  Administered 2012-07-02 – 2012-07-05 (×4): 12.5 mg via ORAL
  Filled 2012-07-02 (×5): qty 0.5

## 2012-07-02 MED ORDER — HEPARIN BOLUS VIA INFUSION
5000.0000 [IU] | Freq: Once | INTRAVENOUS | Status: AC
  Administered 2012-07-02: 5000 [IU] via INTRAVENOUS

## 2012-07-02 MED ORDER — IRBESARTAN 300 MG PO TABS
300.0000 mg | ORAL_TABLET | Freq: Every day | ORAL | Status: DC
  Administered 2012-07-02 – 2012-07-07 (×6): 300 mg via ORAL
  Filled 2012-07-02 (×6): qty 1

## 2012-07-02 MED ORDER — PATIENT'S GUIDE TO USING COUMADIN BOOK
Freq: Once | Status: AC
  Administered 2012-07-02: 18:00:00
  Filled 2012-07-02: qty 1

## 2012-07-02 MED ORDER — ALUM & MAG HYDROXIDE-SIMETH 200-200-20 MG/5ML PO SUSP: 30.0000 mL | Freq: Four times a day (QID) | ORAL | Status: DC | PRN

## 2012-07-02 MED ORDER — AMIODARONE HCL 200 MG PO TABS
200.0000 mg | ORAL_TABLET | Freq: Every day | ORAL | Status: DC
  Administered 2012-07-02 – 2012-07-04 (×3): 200 mg via ORAL
  Filled 2012-07-02 (×3): qty 1

## 2012-07-02 MED ORDER — ACETAMINOPHEN 650 MG RE SUPP: 650.0000 mg | Freq: Four times a day (QID) | RECTAL | Status: DC | PRN

## 2012-07-02 MED ORDER — WARFARIN VIDEO
Freq: Once | Status: AC
  Administered 2012-07-02: 18:00:00

## 2012-07-02 MED ORDER — ONDANSETRON HCL 4 MG/2ML IJ SOLN: 4.0000 mg | Freq: Four times a day (QID) | INTRAMUSCULAR | Status: DC | PRN

## 2012-07-02 MED ORDER — HEPARIN (PORCINE) IN NACL 100-0.45 UNIT/ML-% IJ SOLN
1750.0000 [IU]/h | INTRAMUSCULAR | Status: DC
  Administered 2012-07-02 – 2012-07-03 (×4): 1700 [IU]/h via INTRAVENOUS
  Administered 2012-07-04 – 2012-07-07 (×3): 1750 [IU]/h via INTRAVENOUS
  Filled 2012-07-02 (×16): qty 250

## 2012-07-02 NOTE — Progress Notes (Signed)
Patient seen and examined at bedside.  On Exam Pt is Alert afebrile comfortable, getting venous dopplers done. CVS S1S2  Lungs: CTAB, no wheezing or rhonchi Abdomen: soft NT ND BS+ Extremities: mild swelling of the LEFT LE.  1. Pulmonary Embolism: on IV Heparin. Venous dopplers pending. TEE ending. Started on coumadin.    Continue tomonitor.   Kathlen Mody [facility]  Pager [phone]   If 7PM-7AM, please contact night-coverage  [email or web address]  Password [email or web address]  07/02/2012, 2:49 AM

## 2012-07-02 NOTE — Progress Notes (Signed)
MD paged regarding pt's medications; pt states he takes Crestor at home and refuses to take Lipitor; pt also states he has not taken Sprivia in over a month and was told he did not need to take this medication; HR on monitor at times down to 40's with occasional a-fib and 2nd degree HB; will cont. To monitor.

## 2012-07-02 NOTE — Progress Notes (Signed)
ANTICOAGULATION CONSULT NOTE - follow up  Pharmacy Consult for heparin and coumadin Indication: pulmonary embolus and L DVT  No Known Allergies  Patient Measurements: Height: 5\' 10"  (177.8 cm) Weight: 267 lb 6.7 oz (121.3 kg) IBW/kg (Calculated) : 73  Heparin Dosing Weight: 100 kg  Vital Signs: Temp: 97.8 F (36.6 C) (10/27 0514) Temp src: Oral (10/27 0514) BP: 139/77 mmHg (10/27 0514) Pulse Rate: 54  (10/27 0514)  Labs:  Alvira Philips 07/02/12 0852 07/02/12 0710 07/02/12 0126 07/01/12 2341 07/01/12 1717  HGB -- 13.7 -- -- 14.2  HCT -- 37.7* -- -- 39.4  PLT -- 134* 137* -- 147*  APTT -- -- 29 28 --  LABPROT -- -- 14.3 13.6 --  INR -- -- 1.13 1.05 --  HEPARINUNFRC 0.61 -- -- -- --  CREATININE -- 1.06 -- -- 1.09  CKTOTAL -- -- -- -- --  CKMB -- -- -- -- --  TROPONINI -- -- -- -- --    Estimated Creatinine Clearance: 90.7 ml/min (by C-G formula based on Cr of 1.06).  Assessment: 66 yo male with confirmed PE and L DVT on heparin therapy. His heparin level after a 5000 unit bolus and 1700 units/hr = 0.61. HL within 0.3-0.7 goal.  His CBC is stable and his PLTC remains low at 147 (134 baseline).  PMH of unspecified hematuria.  On effient and asa 81 mg for coronary stent. Today will be day #1/5 of minimum overlap of coumadin for VTE treatment.  Coumadin score = 8.      Goal of Therapy:  Heparin level 0.3-0.7 units/ml Monitor platelets by anticoagulation protocol: Yes  INR 2-3   Plan:  1) continue heparin drip at 1700 units/hr. 2) coumadin 10 mg po x 1 dose 3) coumadin book and video for education 4) daily HL, CBC and INR Herby Abraham, Pharm.D. [phone] 07/02/2012 10:51 AM

## 2012-07-02 NOTE — Progress Notes (Signed)
Pt and wife watching Coumadin video at this time.

## 2012-07-02 NOTE — Progress Notes (Signed)
MD returned page; orders to stop IVF; NS now at 85ml/hr to infuse with IV Heparin; orders for PRN Hydralazine; will administer dose when available from pharmacy; pt and wife made aware; will cont. To monitor.

## 2012-07-02 NOTE — H&P (Addendum)
[facility] History and Physical  [name]. JYN:[id number] DOB: 1945-11-30 DOA: 07/01/2012  Referring physician:  PCP: Willey Blade, MD  Specialists:   Chief Complaint: SOB  HPI: Matthew Ochoa. is a 66 y.o. male who presents to the ED with complaints of worsening SOB X 1 day. He denies any chest pain or fevers or chills.  He reports having a cough and coughing up clear mucus.  In the ED a D-dimer was performed and returned positive, and then a CTA of the chest were performed which revealed Pulmonary emboli.  Patient is on Effient, he denies missing any doses of medications,.  He denies any leg swelling, or trauma to his extremities.  He also denies any prolonged immobility or travel.  He also denies any pleuritic chest pain.   He does have a history of atrial fibrillation and CAD and is on amiodarone and Effient.   He denies any previous history of blood clots and denies any family medical history of bleeding disorders.      Review of Systems: The patient denies anorexia, fever, weight loss, vision loss, decreased hearing, hoarseness, chest pain, syncope, dyspnea on exertion, peripheral edema, balance deficits, hemoptysis, abdominal pain, melena, hematochezia, severe indigestion/heartburn, hematuria, incontinence, genital sores, muscle weakness, suspicious skin lesions, transient blindness, difficulty walking, depression, unusual weight change, abnormal bleeding, enlarged lymph nodes, angioedema, and breast masses.    Past Medical History  Diagnosis Date  . Sleep apnea     NPSG 03-17-98, cpap 14  . Type 2 diabetes mellitus   . Hypertension   . COPD (chronic obstructive pulmonary disease)   . HTN (hypertension)   . Allergic rhinitis   . Abdominal pain, right upper quadrant   . Calculus of gallbladder without mention of cholecystitis or obstruction   . Benign localized hyperplasia of prostate with urinary obstruction and other lower urinary tract symptoms (LUTS)(600.21)   .  Hematuria, unspecified   . Diabetes mellitus   . Hyperlipidemia    Past Surgical History  Procedure Date  . Angioplasty     stent  . Coronary angioplasty with stent placement     Medications:  HOME MEDS Prior to Admission medications   Medication Sig Start Date End Date Taking? Authorizing Provider  amiodarone (PACERONE) 200 MG tablet Take 1 tablet (200 mg total) by mouth daily. 10/12/11  Yes [name], MD  aspirin 81 MG tablet Take 81 mg by mouth daily.     Yes Historical Provider, MD  benzonatate (TESSALON) 200 MG capsule Take 1 capsule (200 mg total) by mouth every 8 (eight) hours as needed for cough. 05/31/12  Yes Waymon Budge, MD  CRESTOR 10 MG tablet Take 1 tablet by mouth daily. 07/05/11  Yes Historical Provider, MD  EFFIENT 10 MG TABS Take 1 tablet by mouth daily.    07/18/11  Yes Historical Provider, MD  metFORMIN (GLUCOPHAGE) 500 MG tablet Take 1 tablet by mouth 2 (two) times daily with a meal.  07/05/11  Yes Historical Provider, MD  nitroGLYCERIN (NITROSTAT) 0.4 MG SL tablet Place 0.4 mg under the tongue every 5 (five) minutes as needed.   Yes Historical Provider, MD  ramipril (ALTACE) 10 MG capsule Take 10 mg by mouth daily.   Yes Historical Provider, MD  telmisartan-hydrochlorothiazide (MICARDIS HCT) 80-12.5 MG per tablet Take 1 tablet by mouth daily.    Yes Historical Provider, MD  traMADol (ULTRAM) 50 MG tablet Take 1 tablet (50 mg total) by mouth every 6 (six) hours as  needed for pain. 06/06/12  Yes Waymon Budge, MD  COMBIVENT [phone] MCG/ACT inhaler Inhale 2 puffs into the lungs 4 (four) times daily as needed. For shortness of breath 07/14/11   Historical Provider, MD  SPIRIVA HANDIHALER 18 MCG inhalation capsule Place 18 mcg into inhaler and inhale daily.  07/13/11   Historical Provider, MD     No Known Allergies   Social History:  reports that he quit smoking about 14 months ago. His smoking use included Cigarettes. He has a 30 pack-year smoking history. He  quit smokeless tobacco use about 14 months ago. He reports that he does not drink alcohol or use illicit drugs.    Family History  Problem Relation Age of Onset  . Asthma Sister   . Lung cancer Father      Physical Exam:  GEN:  Pleasant examined  and in no acute distress; cooperative with exam Filed Vitals:   07/02/12 0100 07/02/12 0115 07/02/12 0142 07/02/12 0218  BP: 151/86 148/88 159/107 146/77  Pulse: 52 48 77   Temp:   97.7 F (36.5 C) 98.3 F (36.8 C)  TempSrc:   Oral Oral  Resp: 20 26  20   Height:  5\' 10"  (1.778 m)    Weight:  121.3 kg (267 lb 6.7 oz)    SpO2: 94% 96% 94% 94%   Blood pressure 146/77, pulse 77, temperature 98.3 F (36.8 C), temperature source Oral, resp. rate 20, height 5\' 10"  (1.778 m), weight 121.3 kg (267 lb 6.7 oz), SpO2 94.00%. PSYCH: He is alert and oriented x4; does not appear anxious does not appear depressed; affect is normal HEENT: Normocephalic and Atraumatic, Mucous membranes pink; PERRLA; EOM intact; Fundi:  Benign;  No scleral icterus, Nares: Patent, Oropharynx: Clear, Fair Dentition, Neck:  FROM, no cervical lymphadenopathy nor thyromegaly or carotid bruit; no JVD; Breasts:: Not examined CHEST WALL: No tenderness CHEST: Normal respiration, clear to auscultation bilaterally HEART: Regular rate and rhythm; no murmurs rubs or gallops BACK: No kyphosis or scoliosis; no CVA tenderness ABDOMEN: Positive Bowel Sounds, Scaphoid, Obese, soft non-tender; no masses, no organomegaly, no pannus; no intertriginous candida. Rectal Exam: Not done EXTREMITIES: No bone or joint deformity; age-appropriate arthropathy of the hands and knees; no cyanosis, clubbing or edema; no ulcerations. Genitalia: not examined PULSES: 2+ and symmetric SKIN: Normal hydration no rash or ulceration CNS: Cranial nerves 2-12 grossly intact no focal neurologic deficit    Labs on Admission:  Basic Metabolic Panel:  Lab 07/01/12 1914  NA 138  K 3.6  CL 103  CO2 24    GLUCOSE 113*  BUN 18  CREATININE 1.09  CALCIUM 9.6  MG --  PHOS --   Liver Function Tests:  Lab 07/01/12 1717  AST 24  ALT 37  ALKPHOS 50  BILITOT 0.5  PROT 7.4  ALBUMIN 4.0   No results found for this basename: LIPASE:5,AMYLASE:5 in the last 168 hours No results found for this basename: AMMONIA:5 in the last 168 hours CBC:  Lab 07/02/12 0126 07/01/12 1717  WBC -- 7.1  NEUTROABS -- 4.9  HGB -- 14.2  HCT -- 39.4  MCV -- 79.4  PLT 137* 147*   Cardiac Enzymes: No results found for this basename: CKTOTAL:5,CKMB:5,CKMBINDEX:5,TROPONINI:5 in the last 168 hours  BNP (last 3 results)  Basename 07/01/12 2032  PROBNP 124.7   CBG: No results found for this basename: GLUCAP:5 in the last 168 hours  Radiological Exams on Admission: Dg Chest 2 View  07/01/2012  *RADIOLOGY REPORT*  Clinical Data: 66 year old male weakness shortness of breath cough.  CHEST - 2 VIEW  Comparison: 06/06/2012 and earlier.  Findings: Larger lung volumes. Stable cardiomegaly and mediastinal contours.  No pneumothorax.  No pulmonary edema.  No pleural effusion or consolidation.  Stable eventration of the diaphragm. Stable small calcified AP window lymph nodes and probable calcified left upper lobe granuloma nearby.  No acute pulmonary opacity. No acute osseous abnormality identified.  IMPRESSION: Stable. No acute cardiopulmonary abnormality.   Original Report Authenticated By: Harley Hallmark, M.D.    Ct Angio Chest Pe W/cm &/or Wo Cm  07/01/2012  *RADIOLOGY REPORT*  Clinical Data: Shortness of breath and bilateral leg weakness. Chest pain.  CT ANGIOGRAPHY CHEST  Technique:  Multidetector CT imaging of the chest using the standard protocol during bolus administration of intravenous contrast. Multiplanar reconstructed images including MIPs were obtained and reviewed to evaluate the vascular anatomy.  Contrast: OMNIPAQUE IOHEXOL 350 MG/ML SOLN  Comparison: None.  Findings: Technically adequate study with  good opacification of the central and segmental pulmonary arteries.  There are filling defects in the distal main pulmonary arteries bilaterally, extending out into multiple upper and lower lobe segmental branches bilaterally.  Changes are consistent with bilateral central and segmental pulmonary emboli.  Mild cardiac enlargement.  Coronary artery calcification.  Normal caliber thoracic aorta.  No significant lymphadenopathy in the chest.  Calcification of the aorta.  Esophagus is decompressed. Visualized upper abdominal organs suggests calcified granulomas in the spleen and a probable cyst in the right kidney.  No pleural effusions.  Respiratory motion artifact in the lungs limits visualization.  No significant airspace consolidation. Airways appear patent.  No pneumothorax.  IMPRESSION: Positive study for bilateral central and segmental pulmonary emboli.  Results were discussed by telephone with Dr. Freida Busman at 2337 hours on 07/01/2012.   Original Report Authenticated By: Marlon Pel, M.D.     EKG: Independently reviewed.   Assessment/Plan Principal Problem:  *Pulmonary embolism Active Problems:  Atrial fibrillation  Edema leg  DIABETES, TYPE 2  HYPERTENSION  COPD (chronic obstructive pulmonary disease)  Hyperlipidemia  CAD (coronary artery disease)   Plan:     Admit to Telemetry Bed Cardiac Monitoring IV Heparin Venous Duplex US of LLE Send for TEE, if not possible the conside 2 D ECHO Check PT/INR, Fibrinogen, PTT, Protein C level  Reconcile Home Medications Protonix SSI coverage  Code Status:  FULL CODE Family Communication: Wife at Bedside Disposition Plan:   Return to Home  Time spent: 66 Minutes  Ron Parker [facility] Pager [phone]  If 7PM-7AM, please contact night-coverage [email or web address] Password [email or web address] 07/02/2012, 2:49 AM

## 2012-07-02 NOTE — Progress Notes (Signed)
MD paged at this time; pt doesn't have diet order in and no home meds have been resumed; will await callback.

## 2012-07-02 NOTE — Progress Notes (Signed)
BP elevated at this time; NS still order to run at 49ml/hr; MD paged to make aware; will await callback.

## 2012-07-02 NOTE — Progress Notes (Signed)
VASCULAR LAB PRELIMINARY  PRELIMINARY  PRELIMINARY  PRELIMINARY  Left lower extremity venous Doppler completed.    Preliminary report:  There is acute, occlusive DVT noted in the left popliteal, femoral and common femoral veins.  No propagation noted to the right lower extremity, but sluggish flow is noted.  Matthew Ochoa, 07/02/2012, 10:07 AM

## 2012-07-03 DIAGNOSIS — I2510 Atherosclerotic heart disease of native coronary artery without angina pectoris: Secondary | ICD-10-CM

## 2012-07-03 DIAGNOSIS — R0609 Other forms of dyspnea: Secondary | ICD-10-CM

## 2012-07-03 DIAGNOSIS — R0989 Other specified symptoms and signs involving the circulatory and respiratory systems: Secondary | ICD-10-CM

## 2012-07-03 DIAGNOSIS — I4891 Unspecified atrial fibrillation: Secondary | ICD-10-CM

## 2012-07-03 LAB — PROTIME-INR
INR: 1.06 (ref 0.00–1.49)
Prothrombin Time: 13.7 seconds (ref 11.6–15.2)

## 2012-07-03 LAB — GLUCOSE, CAPILLARY
Glucose-Capillary: 115 mg/dL — ABNORMAL HIGH (ref 70–99)
Glucose-Capillary: 120 mg/dL — ABNORMAL HIGH (ref 70–99)
Glucose-Capillary: 108 mg/dL — ABNORMAL HIGH (ref 70–99)
Glucose-Capillary: 148 mg/dL — ABNORMAL HIGH (ref 70–99)

## 2012-07-03 LAB — CBC
HCT: 37.9 % — ABNORMAL LOW (ref 39.0–52.0)
Hemoglobin: 13.8 g/dL (ref 13.0–17.0)
MCH: 28.8 pg (ref 26.0–34.0)
MCHC: 36.4 g/dL — ABNORMAL HIGH (ref 30.0–36.0)
MCV: 79.1 fL (ref 78.0–100.0)
Platelets: 138 10*3/uL — ABNORMAL LOW (ref 150–400)
RBC: 4.79 MIL/uL (ref 4.22–5.81)
RDW: 15.6 % — ABNORMAL HIGH (ref 11.5–15.5)
WBC: 7.3 10*3/uL (ref 4.0–10.5)

## 2012-07-03 LAB — PROTEIN C, TOTAL: Protein C, Total: 95 % (ref 72–160)

## 2012-07-03 LAB — HEMOGLOBIN A1C
Hgb A1c MFr Bld: 6 % — ABNORMAL HIGH (ref <5.7)
Mean Plasma Glucose: 126 mg/dL — ABNORMAL HIGH (ref <117)

## 2012-07-03 LAB — HEPARIN LEVEL (UNFRACTIONATED): Heparin Unfractionated: 0.41 IU/mL (ref 0.30–0.70)

## 2012-07-03 MED ORDER — WARFARIN SODIUM 10 MG PO TABS
10.0000 mg | ORAL_TABLET | Freq: Once | ORAL | Status: AC
  Administered 2012-07-03: 10 mg via ORAL
  Filled 2012-07-03: qty 1

## 2012-07-03 NOTE — Progress Notes (Signed)
Patient seen, examined and discussed with my nurse practitioner. Breathing little bit better. No chest pain. Echo is pending. Patient belongs to Dr. [name] and he will assume care in the morning.

## 2012-07-03 NOTE — Progress Notes (Signed)
[facility] PROGRESS NOTE  Matthew Ochoa. WNU:[id number] DOB: [birth year] DOA: 07/01/2012 PCP: Willey Blade, MD  Assessment/Plan: *Pulmonary embolism: On heparin drip. SOB somewhat improved. Coumadin started 07/02/12. INR today 1.06.  Active Problems:  DIABETES, TYPE 2: fair control. CBG 115, 125. Holding metforman 48hrs due to contrast. Will check A1c.   HYPERTENSION: poor control. Home ARB and HCZ discontinued 07/02/12. Continuing ACE and prn appressoline . Monitor closely   COPD (chronic obstructive pulmonary disease): At baseline. No wheeze.    Atrial fibrillation: rate controlled. Continue amiodarone    Hyperlipidemia: will check FLP. Continue home meds   CAD (coronary artery disease)No chest pain. Continue home meds.    Code Status:  Family Communication: wife at bedside Disposition Plan: home when medially stable.    Consultants:  none  Procedures:  Dopplers, TEE  Antibiotics:  none  HPI/Subjective: Ambulating in room. Gait steady. NAD  Objective: Filed Vitals:   07/02/12 1959 07/02/12 2004 07/03/12 0035 07/03/12 0443  BP: 183/83 150/80 153/74 151/96  Pulse: 48  48 56  Temp: 97.9 F (36.6 C)  97.5 F (36.4 C) 98.6 F (37 C)  TempSrc: Oral  Oral Oral  Resp: 20  20 18   Height:      Weight:      SpO2: 97%  95% 90%    Intake/Output Summary (Last 24 hours) at 07/03/12 0759 Last data filed at 07/02/12 1920  Gross per 24 hour  Intake   1468 ml  Output   1500 ml  Net    -32 ml   Filed Weights   07/02/12 0115  Weight: 121.3 kg (267 lb 6.7 oz)    Exam:   General:  Awake alert NAD  Cardiovascular: RRR No MGR trace LEE PPP  Respiratory: normal effort. BSCTA mostly but somewhat distant and fine crackles right base. No wheeze, rhonchi  Abdomen: obese, soft +BS non-tender to palpation  Data Reviewed: Basic Metabolic Panel:  Lab 07/02/12 0347 07/01/12 1717  NA 140 138  K 3.5 3.6  CL 106 103  CO2 25 24  GLUCOSE 104* 113*  BUN 14 18    CREATININE 1.06 1.09  CALCIUM 8.8 9.6  MG -- --  PHOS -- --   Liver Function Tests:  Lab 07/01/12 1717  AST 24  ALT 37  ALKPHOS 50  BILITOT 0.5  PROT 7.4  ALBUMIN 4.0   No results found for this basename: LIPASE:5,AMYLASE:5 in the last 168 hours No results found for this basename: AMMONIA:5 in the last 168 hours CBC:  Lab 07/03/12 0525 07/02/12 0710 07/02/12 0126 07/01/12 1717  WBC 7.3 7.4 -- 7.1  NEUTROABS -- -- -- 4.9  HGB 13.8 13.7 -- 14.2  HCT 37.9* 37.7* -- 39.4  MCV 79.1 79.2 -- 79.4  PLT 138* 134* 137* 147*   Cardiac Enzymes: No results found for this basename: CKTOTAL:5,CKMB:5,CKMBINDEX:5,TROPONINI:5 in the last 168 hours BNP (last 3 results)  Basename 07/01/12 2032  PROBNP 124.7   CBG:  Lab 07/03/12 0553 07/02/12 2106 07/02/12 1635 07/02/12 1109 07/02/12 0608  GLUCAP 115* 125* 113* 149* 138*    No results found for this or any previous visit (from the past 240 hour(s)).   Studies: Dg Chest 2 View  07/01/2012  *RADIOLOGY REPORT*  Clinical Data: 66 year old male weakness shortness of breath cough.  CHEST - 2 VIEW  Comparison: 06/06/2012 and earlier.  Findings: Larger lung volumes. Stable cardiomegaly and mediastinal contours.  No pneumothorax.  No pulmonary edema.  No pleural effusion or  consolidation.  Stable eventration of the diaphragm. Stable small calcified AP window lymph nodes and probable calcified left upper lobe granuloma nearby.  No acute pulmonary opacity. No acute osseous abnormality identified.  IMPRESSION: Stable. No acute cardiopulmonary abnormality.   Original Report Authenticated By: Harley Hallmark, M.D.    Ct Angio Chest Pe W/cm &/or Wo Cm  07/01/2012  *RADIOLOGY REPORT*  Clinical Data: Shortness of breath and bilateral leg weakness. Chest pain.  CT ANGIOGRAPHY CHEST  Technique:  Multidetector CT imaging of the chest using the standard protocol during bolus administration of intravenous contrast. Multiplanar reconstructed images including  MIPs were obtained and reviewed to evaluate the vascular anatomy.  Contrast: OMNIPAQUE IOHEXOL 350 MG/ML SOLN  Comparison: None.  Findings: Technically adequate study with good opacification of the central and segmental pulmonary arteries.  There are filling defects in the distal main pulmonary arteries bilaterally, extending out into multiple upper and lower lobe segmental branches bilaterally.  Changes are consistent with bilateral central and segmental pulmonary emboli.  Mild cardiac enlargement.  Coronary artery calcification.  Normal caliber thoracic aorta.  No significant lymphadenopathy in the chest.  Calcification of the aorta.  Esophagus is decompressed. Visualized upper abdominal organs suggests calcified granulomas in the spleen and a probable cyst in the right kidney.  No pleural effusions.  Respiratory motion artifact in the lungs limits visualization.  No significant airspace consolidation. Airways appear patent.  No pneumothorax.  IMPRESSION: Positive study for bilateral central and segmental pulmonary emboli.  Results were discussed by telephone with Dr. Freida Busman at 2337 hours on 07/01/2012.   Original Report Authenticated By: Marlon Pel, M.D.     Scheduled Meds:   . sodium chloride   Intravenous STAT  . amiodarone  200 mg Oral Daily  . aspirin EC  81 mg Oral Daily  . atorvastatin  20 mg Oral q1800  . irbesartan  300 mg Oral Daily   And  . hydrochlorothiazide  12.5 mg Oral Daily  . patient's guide to using coumadin book   Does not apply Once  . prasugrel  10 mg Oral Daily  . ramipril  10 mg Oral Daily  . sodium chloride  3 mL Intravenous Q12H  . tiotropium  18 mcg Inhalation Daily  . warfarin  10 mg Oral ONCE-1800  . warfarin   Does not apply Once  . Warfarin - Pharmacist Dosing Inpatient   Does not apply q1800  . DISCONTD: aspirin  81 mg Oral Daily  . DISCONTD: telmisartan-hydrochlorothiazide  1 tablet Oral Daily   Continuous Infusions:   . sodium chloride 20  mL/hr at 07/02/12 1845  . heparin 1,700 Units/hr (07/03/12 0405)  . heparin    . DISCONTD: sodium chloride 125 mL/hr (07/02/12 0145)  . DISCONTD: sodium chloride 75 mL/hr at 07/02/12 1711    Principal Problem:      Time spent: 30 minutes    Gwenyth Bender NP [facility]  If 8PM-8AM, please contact night-coverage at [email or web address], password [email or web address] 07/03/2012, 7:59 AM  LOS: 2 days

## 2012-07-03 NOTE — Progress Notes (Signed)
ANTICOAGULATION CONSULT NOTE - Follow Up Consult  Pharmacy Consult for heparin and coumadin Indication: pulmonary embolus and L DVT  No Known Allergies  Patient Measurements: Height: 5\' 10"  (177.8 cm) Weight: 267 lb 6.7 oz (121.3 kg) IBW/kg (Calculated) : 73  Heparin Dosing Weight: 100 kg  Vital Signs: Temp: 98.6 F (37 C) (10/28 0845) Temp src: Oral (10/28 0845) BP: 110/76 mmHg (10/28 0845) Pulse Rate: 50  (10/28 0845)  Labs:  Alvira Philips 07/03/12 0525 07/02/12 0852 07/02/12 0710 07/02/12 0126 07/01/12 2341 07/01/12 1717  HGB 13.8 -- 13.7 -- -- --  HCT 37.9* -- 37.7* -- -- 39.4  PLT 138* -- 134* 137* -- --  APTT -- -- -- 29 28 --  LABPROT 13.7 -- -- 14.3 13.6 --  INR 1.06 -- -- 1.13 1.05 --  HEPARINUNFRC 0.41 0.61 -- -- -- --  CREATININE -- -- 1.06 -- -- 1.09  CKTOTAL -- -- -- -- -- --  CKMB -- -- -- -- -- --  TROPONINI -- -- -- -- -- --    Estimated Creatinine Clearance: 90.7 ml/min (by C-G formula based on Cr of 1.06).  Assessment: 66 y.o male with confirmed PE and L DVT, on heparin therapy. Heparin level is therapeutic at 0.41 today.  INR is subtherapeutic at 1.06 today.  H/H stable but PLTC remains low at 138 (134 baseline).  PMH of unspecified hematuria.  On effient and ASA 81 mg for coronary stent.  Today will be day 2/5 of minimum overlap of coumadin for VTE treatment.  Goal of Therapy:  Heparin level 0.3-0.7 units/ml Monitor platelets by anticoagulation protocol: Yes INR 2-3   Plan:  1. Continue heparin at rate of 1700 units/hr 2. Coumadin 10 mg po x 1 dose  3. Daily CBC, HL, and INR  Cavalier, Marie 07/03/2012,12:40 PM   Agree with above recommendations.  Wendie Simmer, PharmD, BCPS Clinical Pharmacist  Pager: [phone]

## 2012-07-03 NOTE — Care Management Note (Signed)
    Page 1 of 1   07/07/2012     1:54:49 PM   CARE MANAGEMENT NOTE 07/07/2012  Patient:  Matthew Ochoa,Matthew Ochoa   Account Number:  [id number]  Date Initiated:  07/03/2012  Documentation initiated by:  Rosco Harriott  Subjective/Objective Assessment:   PT ADM ON 07/01/12 WITH MULT PULM EMBOLI.  PTA, PT INDEPENDENT, LIVES WITH SPOUSE.     Action/Plan:   WILL FOLLOW FOR HOME NEEDS AS PT PROGRESSES.   Anticipated DC Date:  07/05/2012   Anticipated DC Plan:  HOME/SELF CARE      DC Planning Services  CM consult      Choice offered to / List presented to:             Status of service:  Completed, signed off Medicare Important Message given?   (If response is "NO", the following Medicare IM given date fields will be blank) Date Medicare IM given:   Date Additional Medicare IM given:    Discharge Disposition:  HOME/SELF CARE  Per UR Regulation:  Reviewed for med. necessity/level of care/duration of stay  If discussed at Long Length of Stay Meetings, dates discussed:    Comments:  07/07/12 Andreina Outten,RN,BSN [phone] PT FOR DC HOME TODAY; WILL HAVE FOLLOW UP BLOOD DRAWS AT DR Diamantina Providence OFFICE, STARTING ON MONDAY.

## 2012-07-04 DIAGNOSIS — G4733 Obstructive sleep apnea (adult) (pediatric): Secondary | ICD-10-CM | POA: Diagnosis present

## 2012-07-04 DIAGNOSIS — E119 Type 2 diabetes mellitus without complications: Secondary | ICD-10-CM

## 2012-07-04 DIAGNOSIS — I10 Essential (primary) hypertension: Secondary | ICD-10-CM

## 2012-07-04 DIAGNOSIS — Z9989 Dependence on other enabling machines and devices: Secondary | ICD-10-CM | POA: Diagnosis present

## 2012-07-04 LAB — PROTIME-INR
INR: 1.24 (ref 0.00–1.49)
Prothrombin Time: 15.4 seconds — ABNORMAL HIGH (ref 11.6–15.2)

## 2012-07-04 LAB — CBC
HCT: 37.5 % — ABNORMAL LOW (ref 39.0–52.0)
Hemoglobin: 13.6 g/dL (ref 13.0–17.0)
MCH: 28.6 pg (ref 26.0–34.0)
MCHC: 36.3 g/dL — ABNORMAL HIGH (ref 30.0–36.0)
MCV: 78.9 fL (ref 78.0–100.0)
Platelets: 135 10*3/uL — ABNORMAL LOW (ref 150–400)
RBC: 4.75 MIL/uL (ref 4.22–5.81)
RDW: 15.6 % — ABNORMAL HIGH (ref 11.5–15.5)
WBC: 7.7 10*3/uL (ref 4.0–10.5)

## 2012-07-04 LAB — GLUCOSE, CAPILLARY
Glucose-Capillary: 128 mg/dL — ABNORMAL HIGH (ref 70–99)
Glucose-Capillary: 127 mg/dL — ABNORMAL HIGH (ref 70–99)
Glucose-Capillary: 115 mg/dL — ABNORMAL HIGH (ref 70–99)

## 2012-07-04 LAB — HEPARIN LEVEL (UNFRACTIONATED): Heparin Unfractionated: 0.32 IU/mL (ref 0.30–0.70)

## 2012-07-04 MED ORDER — WARFARIN SODIUM 10 MG PO TABS
10.0000 mg | ORAL_TABLET | Freq: Once | ORAL | Status: AC
  Administered 2012-07-04: 10 mg via ORAL
  Filled 2012-07-04: qty 1

## 2012-07-04 MED ORDER — METFORMIN HCL 500 MG PO TABS
500.0000 mg | ORAL_TABLET | Freq: Two times a day (BID) | ORAL | Status: DC
  Administered 2012-07-04 – 2012-07-07 (×6): 500 mg via ORAL
  Filled 2012-07-04 (×8): qty 1

## 2012-07-04 MED ORDER — AMIODARONE HCL 100 MG PO TABS
100.0000 mg | ORAL_TABLET | Freq: Every day | ORAL | Status: DC
  Filled 2012-07-04: qty 1

## 2012-07-04 NOTE — Consult Note (Signed)
Reason for Consult: Atrial tachycardia arrhythmias with pauses Referring Physician: [facility]  [name]. is an 66 y.o. male.  HPI: Patient is 66 year old male with past medical history significant for coronary artery disease history of PCI to distal RCA in August of 2012 hypertension, hypercholesteremia history of atrial tachyarrhythmias, morbid obesity, obstructive sleep apnea, ischemic cardiomyopathy EF of approximately 40-45%, was admitted because of sudden onset of shortness of breath and was noted to have bilateral pulmonary embolism and left leg DVT. Cardiologic consultation is called as patient was noted to have frequent APCs and the pauses on the monitor up to 4 second patient asymptomatic. Patient denies any palpitation lightheadedness or syncopal episode. Patient denies any chest pain nausea vomiting diaphoresis. Denies any hemoptysis. Denies history of PND orthopnea but complains of leg swelling. Patient states he feels better since admission. Patient had frequent episodes of atrial tachyarrhythmias and marked sinus bradycardia in the past was initially treated with beta blockers which were switched to amiodarone with improvement in his tachyarrhythmias.  Past Medical History  Diagnosis Date  . Sleep apnea     NPSG 03-17-98, cpap 14  . Type 2 diabetes mellitus   . Hypertension   . COPD (chronic obstructive pulmonary disease)   . HTN (hypertension)   . Allergic rhinitis   . Abdominal pain, right upper quadrant   . Calculus of gallbladder without mention of cholecystitis or obstruction   . Benign localized hyperplasia of prostate with urinary obstruction and other lower urinary tract symptoms (LUTS)(600.21)   . Hematuria, unspecified   . Diabetes mellitus   . Hyperlipidemia     Past Surgical History  Procedure Date  . Angioplasty     stent  . Coronary angioplasty with stent placement     Family History  Problem Relation Age of Onset  . Asthma Sister   . Lung  cancer Father     Social History:  reports that he quit smoking about 14 months ago. His smoking use included Cigarettes. He has a 30 pack-year smoking history. He quit smokeless tobacco use about 14 months ago. He reports that he does not drink alcohol or use illicit drugs.  Allergies: No Known Allergies  Medications: I have reviewed the patient's current medications.  Results for orders placed during the hospital encounter of 07/01/12 (from the past 48 hour(s))  GLUCOSE, CAPILLARY     Status: Abnormal   Collection Time   07/02/12  9:06 PM      Component Value Range Comment   Glucose-Capillary 125 (*) 70 - 99 mg/dL   HEPARIN LEVEL (UNFRACTIONATED)     Status: Normal   Collection Time   07/03/12  5:25 AM      Component Value Range Comment   Heparin Unfractionated 0.41  0.30 - 0.70 IU/mL   CBC     Status: Abnormal   Collection Time   07/03/12  5:25 AM      Component Value Range Comment   WBC 7.3  4.0 - 10.5 K/uL    RBC 4.79  4.22 - 5.81 MIL/uL    Hemoglobin 13.8  13.0 - 17.0 g/dL    HCT 14.7 (*) 82.9 - 52.0 %    MCV 79.1  78.0 - 100.0 fL    MCH 28.8  26.0 - 34.0 pg    MCHC 36.4 (*) 30.0 - 36.0 g/dL    RDW 56.2 (*) 13.0 - 15.5 %    Platelets 138 (*) 150 - 400 K/uL   PROTIME-INR  Status: Normal   Collection Time   07/03/12  5:25 AM      Component Value Range Comment   Prothrombin Time 13.7  11.6 - 15.2 seconds    INR 1.06  0.00 - 1.49   HEMOGLOBIN A1C     Status: Abnormal   Collection Time   07/03/12  5:25 AM      Component Value Range Comment   Hemoglobin A1C 6.0 (*) <5.7 %    Mean Plasma Glucose 126 (*) <117 mg/dL   GLUCOSE, CAPILLARY     Status: Abnormal   Collection Time   07/03/12  5:53 AM      Component Value Range Comment   Glucose-Capillary 115 (*) 70 - 99 mg/dL   GLUCOSE, CAPILLARY     Status: Abnormal   Collection Time   07/03/12 11:43 AM      Component Value Range Comment   Glucose-Capillary 120 (*) 70 - 99 mg/dL    Comment 1 Documented in Chart       Comment 2 Notify RN     GLUCOSE, CAPILLARY     Status: Abnormal   Collection Time   07/03/12  4:34 PM      Component Value Range Comment   Glucose-Capillary 108 (*) 70 - 99 mg/dL    Comment 1 Documented in Chart      Comment 2 Notify RN     GLUCOSE, CAPILLARY     Status: Abnormal   Collection Time   07/03/12  9:31 PM      Component Value Range Comment   Glucose-Capillary 148 (*) 70 - 99 mg/dL    Comment 1 Documented in Chart      Comment 2 Notify RN     HEPARIN LEVEL (UNFRACTIONATED)     Status: Normal   Collection Time   07/04/12  6:20 AM      Component Value Range Comment   Heparin Unfractionated 0.32  0.30 - 0.70 IU/mL   CBC     Status: Abnormal   Collection Time   07/04/12  6:20 AM      Component Value Range Comment   WBC 7.7  4.0 - 10.5 K/uL    RBC 4.75  4.22 - 5.81 MIL/uL    Hemoglobin 13.6  13.0 - 17.0 g/dL    HCT 40.9 (*) 81.1 - 52.0 %    MCV 78.9  78.0 - 100.0 fL    MCH 28.6  26.0 - 34.0 pg    MCHC 36.3 (*) 30.0 - 36.0 g/dL    RDW 91.4 (*) 78.2 - 15.5 %    Platelets 135 (*) 150 - 400 K/uL   PROTIME-INR     Status: Abnormal   Collection Time   07/04/12  6:20 AM      Component Value Range Comment   Prothrombin Time 15.4 (*) 11.6 - 15.2 seconds    INR 1.24  0.00 - 1.49   GLUCOSE, CAPILLARY     Status: Abnormal   Collection Time   07/04/12  6:45 AM      Component Value Range Comment   Glucose-Capillary 128 (*) 70 - 99 mg/dL    Comment 1 Documented in Chart      Comment 2 Notify RN     GLUCOSE, CAPILLARY     Status: Abnormal   Collection Time   07/04/12 11:18 AM      Component Value Range Comment   Glucose-Capillary 127 (*) 70 - 99 mg/dL    Comment 1 Documented  in Chart      Comment 2 Notify RN     GLUCOSE, CAPILLARY     Status: Abnormal   Collection Time   07/04/12  4:17 PM      Component Value Range Comment   Glucose-Capillary 115 (*) 70 - 99 mg/dL    Comment 1 Documented in Chart      Comment 2 Notify RN       No results found.  Review of Systems   Constitutional: Negative for fever and chills.  Eyes: Negative for double vision.  Respiratory: Negative for cough, hemoptysis and sputum production.   Cardiovascular: Positive for leg swelling. Negative for chest pain, palpitations, orthopnea and claudication.  Gastrointestinal: Negative for heartburn, nausea, vomiting and abdominal pain.  Skin: Negative for rash.  Neurological: Negative for headaches.   Blood pressure 138/77, pulse 63, temperature 98.4 F (36.9 C), temperature source Oral, resp. rate 18, height 5\' 10"  (1.778 m), weight 121.3 kg (267 lb 6.7 oz), SpO2 98.00%. Physical Exam  Constitutional: He appears well-developed and well-nourished.  HENT:  Head: Normocephalic and atraumatic.  Nose: Nose normal.  Mouth/Throat: No oropharyngeal exudate.  Eyes: Conjunctivae normal are normal. Pupils are equal, round, and reactive to light. Left eye exhibits no discharge. No scleral icterus.  Neck: Normal range of motion. Neck supple. No JVD present. No tracheal deviation present. No thyromegaly present.  Cardiovascular:       Regular rate and rhythm with frequent APCs on the monitor S1 and S2 soft. Soft systolic murmur no S3 gallop  Respiratory: Effort normal and breath sounds normal. No respiratory distress. He has no wheezes. He has no rales.  GI: Soft. Bowel sounds are normal. He exhibits no distension. There is no tenderness. There is no rebound.  Musculoskeletal:       No clubbing cyanosis 1+ edema right leg. Diffuse swelling left leg    Assessment/Plan: Recurrent atrial tachyarrhythmias with pauses rule out sick sinus syndrome/tachybradycardia syndrome Coronary artery disease status post PCI to RCA stable Bilateral pulmonary embolism DVT left leg Hypertension Hypercholesteremia Morbid obesity Obstructive sleep apnea on CPAP Ischemic cardiomyopathy Thrombocytopenia chronic Plan Decrease  to amiodarone 100 mg daily Agree with stopping effient Check 2-D echo check LV and  RV function Agree with heparin and Coumadin May need a permanent pacemaker in significant frequent pauses off amiodarone.  Olene Godfrey N 07/04/2012, 7:07 PM

## 2012-07-04 NOTE — Progress Notes (Signed)
Chart reviewed. Patient interviewed and examined. Discussed with Ms. Toya Smothers, NP-per progress note for today reviewed and editorial changes made as below.  Intermittent left leg pain. Denies dyspnea-but has not done much walking. No chest pain. Denies palpitations, dizziness or lightheadedness. No bleeding.  Physical exam Telemetry shows sinus rhythm with frequent PACs and intermittent episodes of sinus bradycardia in the 40s and slow ventricular response is with pauses of up to 2.37 seconds-asymptomatic. CNS: Alert and oriented. No focal neurological deficits. Extremities: Left leg mildly asymmetrically swollen compared to right. Bilateral 1+ pitting pedal edema. Peripheral pulses symmetrically well felt.  Assessment and plan (addendum)  Acute bilateral pulmonary embolism and left lower extremity DVT: Continue heparin bridging and Coumadin per pharmacy. INR still subtherapeutic. Patient on low dose aspirin and Effient-apparently had coronary stent? August 2012. Cardiology consulted to see if patient can come off Effient to reduce bleeding risk.  History of atrial arrhythmias (not A. fib): Dr. Sharyn Lull, cardiology consulted. Obtain 2-D echo. Check BMP and magnesium in a.m.Marland Kitchen Continue amiodarone.  Thrombocytopenia: Seems chronic and stable. Monitor periodically.  OSA: Continue nightly CPAP  Discussed with patient and spouse at the bedside.  Discussed with Dr. Willey Blade who has requested that the hospitalist service continued to manage patient during this hospitalization.  Marykatherine Sherwood 5:49 PM

## 2012-07-04 NOTE — Progress Notes (Signed)
Tele show 4.03 pause , sinus rhythm, pac, irregular . Pt sitting on side of bed asymptomatic Dr. Sharyn Lull, and Hongali  bp 137/74 Egbert Garibaldi A

## 2012-07-04 NOTE — Progress Notes (Signed)
[facility] PROGRESS NOTE  Matthew Ochoa. ZOX:[id number] DOB: [birth year] DOA: 07/01/2012 PCP: Willey Blade, MD  Assessment/Plan: Pulmonary embolism:  Continue heparin drip per pharmacy. SOB continues to  Improve but remains on exertion. Coumadin started 07/02/12 per pharmacy. INR today 1.24.  Active Problems:  DIABETES, TYPE 2: fair control. CBG 128 127. Holding metforman 48hrs due to contrast. Will resume.  A1c 6.0.  HYPERTENSION: fair control.  Continuing home meds and prn appressoline . Monitor closely  COPD (chronic obstructive pulmonary disease): At baseline. No wheeze.  Atrial fibrillation: rate controlled. Continue amiodarone  Hyperlipidemia: will check FLP. Continue home meds . Of note pt refusing our substitution for crestor. CAD (coronary artery disease)No chest pain. Continue home meds.     Code Status: full Family Communication: wife at bedside Disposition Plan: home with wife when INR therapeutic. Hopefully tomorrow   Consultants:  none  Procedures:  Dopplers, echo  Antibiotics:  none  HPI/Subjective: Sitting on side of bed. Reports leg pain last evening and still mild sob with exertion. Denies CP.   Objective: Filed Vitals:   07/04/12 0653 07/04/12 0920 07/04/12 1040 07/04/12 1155  BP: 160/90  174/94 164/82  Pulse: 47 48    Temp: 98.5 F (36.9 C) 98.3 F (36.8 C)    TempSrc: Oral Oral    Resp: 20     Height:      Weight:      SpO2: 94% 98%      Intake/Output Summary (Last 24 hours) at 07/04/12 1202 Last data filed at 07/04/12 0920  Gross per 24 hour  Intake    960 ml  Output   2150 ml  Net  -1190 ml   Filed Weights   07/02/12 0115  Weight: 121.3 kg (267 lb 6.7 oz)    Exam:   General:  Awake, alert NAD  Cardiovascular: RRR No MGR trace LEE  Respiratory: normal effort. BSCTAB no wheeze, crackles  Abdomen: obese, soft +BS non-tender to palpation.   Data Reviewed: Basic Metabolic Panel:  Lab 07/02/12 8119 07/01/12 1717    NA 140 138  K 3.5 3.6  CL 106 103  CO2 25 24  GLUCOSE 104* 113*  BUN 14 18  CREATININE 1.06 1.09  CALCIUM 8.8 9.6  MG -- --  PHOS -- --   Liver Function Tests:  Lab 07/01/12 1717  AST 24  ALT 37  ALKPHOS 50  BILITOT 0.5  PROT 7.4  ALBUMIN 4.0   No results found for this basename: LIPASE:5,AMYLASE:5 in the last 168 hours No results found for this basename: AMMONIA:5 in the last 168 hours CBC:  Lab 07/04/12 0620 07/03/12 0525 07/02/12 0710 07/02/12 0126 07/01/12 1717  WBC 7.7 7.3 7.4 -- 7.1  NEUTROABS -- -- -- -- 4.9  HGB 13.6 13.8 13.7 -- 14.2  HCT 37.5* 37.9* 37.7* -- 39.4  MCV 78.9 79.1 79.2 -- 79.4  PLT 135* 138* 134* 137* 147*   Cardiac Enzymes: No results found for this basename: CKTOTAL:5,CKMB:5,CKMBINDEX:5,TROPONINI:5 in the last 168 hours BNP (last 3 results)  Basename 07/01/12 2032  PROBNP 124.7   CBG:  Lab 07/04/12 1118 07/04/12 0645 07/03/12 2131 07/03/12 1634 07/03/12 1143  GLUCAP 127* 128* 148* 108* 120*    No results found for this or any previous visit (from the past 240 hour(s)).   Studies: No results found.  Scheduled Meds:   . amiodarone  200 mg Oral Daily  . aspirin EC  81 mg Oral Daily  . atorvastatin  20  mg Oral q1800  . irbesartan  300 mg Oral Daily   And  . hydrochlorothiazide  12.5 mg Oral Daily  . prasugrel  10 mg Oral Daily  . ramipril  10 mg Oral Daily  . sodium chloride  3 mL Intravenous Q12H  . tiotropium  18 mcg Inhalation Daily  . warfarin  10 mg Oral ONCE-1800  . warfarin  10 mg Oral ONCE-1800  . Warfarin - Pharmacist Dosing Inpatient   Does not apply q1800   Continuous Infusions:   . sodium chloride 20 mL/hr at 07/02/12 1845  . heparin 1,700 Units/hr (07/03/12 2006)  . DISCONTD: heparin      Principal Problem:  *Pulmonary embolism Active Problems:  DIABETES, TYPE 2  HYPERTENSION  COPD (chronic obstructive pulmonary disease)  Atrial fibrillation  Edema leg  Hyperlipidemia  CAD (coronary artery  disease)    Time spent: 30 minutes    [facility] M NP [facility]  If 8PM-8AM, please contact night-coverage at [email or web address], password [facility] 07/04/2012, 12:02 PM  LOS: 3 days

## 2012-07-04 NOTE — Progress Notes (Signed)
ANTICOAGULATION CONSULT NOTE - Follow Up Consult  Pharmacy Consult for heparin and coumadin Indication: pulmonary embolus and L DVT   No Known Allergies  Patient Measurements: Height: 5\' 10"  (177.8 cm) Weight: 267 lb 6.7 oz (121.3 kg) IBW/kg (Calculated) : 73  Heparin Dosing Weight: 100 kg  Vital Signs: Temp: 98.5 F (36.9 C) (10/29 0653) Temp src: Oral (10/29 0653) BP: 160/90 mmHg (10/29 0653) Pulse Rate: 47  (10/29 0653)  Labs:  Matthew Ochoa 07/04/12 0620 07/03/12 0525 07/02/12 0852 07/02/12 0710 07/02/12 0126 07/01/12 2341 07/01/12 1717  HGB 13.6 13.8 -- -- -- -- --  HCT 37.5* 37.9* -- 37.7* -- -- --  PLT 135* 138* -- 134* -- -- --  APTT -- -- -- -- 29 28 --  LABPROT 15.4* 13.7 -- -- 14.3 -- --  INR 1.24 1.06 -- -- 1.13 -- --  HEPARINUNFRC 0.32 0.41 0.61 -- -- -- --  CREATININE -- -- -- 1.06 -- -- 1.09  CKTOTAL -- -- -- -- -- -- --  CKMB -- -- -- -- -- -- --  TROPONINI -- -- -- -- -- -- --    Estimated Creatinine Clearance: 90.7 ml/min (by C-G formula based on Cr of 1.06).   Assessment: Patient is a 66 y.o M on anticoagulation overlap day #3 of 5 days minimum for DVT and bilateral PEs.  INR is subtherapeutic but is trending up towards goal range.  Heparin level is at goal but is at the lower end of goal range.  No bleeding noted.  Goal of Therapy:  Heparin level 0.3-0.7 units/ml; INR 2-3 Monitor platelets by anticoagulation protocol: Yes   Plan:  1) Increase heparin drip up slightly to 1750 units/hr 2) Repeat coumadin 10mg  PO x1 today  Erianna Jolly P 07/04/2012,9:54 AM

## 2012-07-05 DIAGNOSIS — J449 Chronic obstructive pulmonary disease, unspecified: Secondary | ICD-10-CM

## 2012-07-05 DIAGNOSIS — J4489 Other specified chronic obstructive pulmonary disease: Secondary | ICD-10-CM

## 2012-07-05 LAB — CBC
HCT: 38.6 % — ABNORMAL LOW (ref 39.0–52.0)
Hemoglobin: 14 g/dL (ref 13.0–17.0)
MCH: 28.6 pg (ref 26.0–34.0)
MCHC: 36.3 g/dL — ABNORMAL HIGH (ref 30.0–36.0)
MCV: 78.8 fL (ref 78.0–100.0)
Platelets: 145 10*3/uL — ABNORMAL LOW (ref 150–400)
RBC: 4.9 MIL/uL (ref 4.22–5.81)
RDW: 15.7 % — ABNORMAL HIGH (ref 11.5–15.5)
WBC: 7.6 10*3/uL (ref 4.0–10.5)

## 2012-07-05 LAB — GLUCOSE, CAPILLARY
Glucose-Capillary: 141 mg/dL — ABNORMAL HIGH (ref 70–99)
Glucose-Capillary: 119 mg/dL — ABNORMAL HIGH (ref 70–99)
Glucose-Capillary: 113 mg/dL — ABNORMAL HIGH (ref 70–99)
Glucose-Capillary: 97 mg/dL (ref 70–99)
Glucose-Capillary: 138 mg/dL — ABNORMAL HIGH (ref 70–99)

## 2012-07-05 LAB — BASIC METABOLIC PANEL
BUN: 14 mg/dL (ref 6–23)
CO2: 24 mEq/L (ref 19–32)
Calcium: 9.4 mg/dL (ref 8.4–10.5)
Chloride: 102 mEq/L (ref 96–112)
Creatinine, Ser: 1.01 mg/dL (ref 0.50–1.35)
GFR calc Af Amer: 88 mL/min — ABNORMAL LOW (ref >90)
GFR calc non Af Amer: 76 mL/min — ABNORMAL LOW (ref >90)
Glucose, Bld: 117 mg/dL — ABNORMAL HIGH (ref 70–99)
Potassium: 3.6 mEq/L (ref 3.5–5.1)
Sodium: 136 mEq/L (ref 135–145)

## 2012-07-05 LAB — LIPID PANEL
Cholesterol: 98 mg/dL (ref 0–200)
HDL: 30 mg/dL — ABNORMAL LOW (ref >39)
LDL Cholesterol: 48 mg/dL (ref 0–99)
Total CHOL/HDL Ratio: 3.3 RATIO
Triglycerides: 102 mg/dL (ref <150)
VLDL: 20 mg/dL (ref 0–40)

## 2012-07-05 LAB — MAGNESIUM: Magnesium: 2 mg/dL (ref 1.5–2.5)

## 2012-07-05 LAB — PROTIME-INR
INR: 1.55 — ABNORMAL HIGH (ref 0.00–1.49)
Prothrombin Time: 18.1 seconds — ABNORMAL HIGH (ref 11.6–15.2)

## 2012-07-05 LAB — HEPARIN LEVEL (UNFRACTIONATED): Heparin Unfractionated: 0.43 IU/mL (ref 0.30–0.70)

## 2012-07-05 MED ORDER — SENNOSIDES-DOCUSATE SODIUM 8.6-50 MG PO TABS
2.0000 | ORAL_TABLET | Freq: Two times a day (BID) | ORAL | Status: DC
  Administered 2012-07-05 – 2012-07-07 (×4): 2 via ORAL
  Filled 2012-07-05 (×5): qty 2

## 2012-07-05 MED ORDER — WARFARIN SODIUM 10 MG PO TABS
10.0000 mg | ORAL_TABLET | Freq: Once | ORAL | Status: AC
Start: 2012-07-05 — End: 2012-07-05
  Administered 2012-07-05: 10 mg via ORAL
  Filled 2012-07-05: qty 1

## 2012-07-05 NOTE — Progress Notes (Signed)
ANTICOAGULATION CONSULT NOTE - Follow Up Consult  Pharmacy Consult for heparin and coumadin Indication: pulmonary embolus and L DVT  No Known Allergies  Patient Measurements: Height: 5\' 10"  (177.8 cm) Weight: 267 lb 6.7 oz (121.3 kg) IBW/kg (Calculated) : 73  Heparin Dosing Weight: 100 kg  Vital Signs: Temp: 97.6 F (36.4 C) (10/30 0623) Temp src: Oral (10/30 0623) BP: 155/80 mmHg (10/30 0623) Pulse Rate: 44  (10/30 0623)  Labs:  Basename 07/05/12 0525 07/04/12 0620 07/03/12 0525  HGB 14.0 13.6 --  HCT 38.6* 37.5* 37.9*  PLT 145* 135* 138*  APTT -- -- --  LABPROT 18.1* 15.4* 13.7  INR 1.55* 1.24 1.06  HEPARINUNFRC 0.43 0.32 0.41  CREATININE 1.01 -- --  CKTOTAL -- -- --  CKMB -- -- --  TROPONINI -- -- --    Estimated Creatinine Clearance: 95.2 ml/min (by C-G formula based on Cr of 1.01).  Assessment: 66 y.o male on anticoagulation overlap day 4/5 for DVT and bilateral PEs. Heparin level is therapeutic at 0.43.  INR is still subtherapeutic at 1.55 but is trending up towards goal .  Goal of Therapy:  Heparin level 0.3-0.7 units/ml; INR 2-3 Monitor platelets by anticoagulation protocol: Yes   Plan:  1) Continue heparin at rate of 1750 units/hr 2) Coumadin 10 mg po x 1 dose  Shanon Brow 07/05/2012,8:21 AM   Agree with Marie's assessment and recommendations. Dorna Leitz, PharmD, BCPS

## 2012-07-05 NOTE — Progress Notes (Signed)
*  PRELIMINARY RESULTS* Echocardiogram 2D Echocardiogram has been performed.  Jeryl Columbia 07/05/2012, 3:07 PM

## 2012-07-05 NOTE — Progress Notes (Signed)
Called Echo Lab to confirm patient was on schedule to have 2DEcho today. Will be done today. Harlow Asa

## 2012-07-05 NOTE — Progress Notes (Signed)
Subjective:  Patient denies any chest pain or shortness of breath denies any dizziness or lightheadedness. Had frequent episodes of pauses between 2-3 seconds yesterday. Will hold off on amiodarone for a day or 2 as patient has been started on Coumadin will restart amiodarone 100 mg daily in one to 2 days. Patient ambulating in hall without any problems Objective:  Vital Signs in the last 24 hours: Temp:  [97.6 F (36.4 C)-98.5 F (36.9 C)] 97.6 F (36.4 C) (10/30 0623) Pulse Rate:  [44-63] 44  (10/30 0623) Resp:  [18] 18  (10/30 0623) BP: (138-155)/(77-92) 148/90 mmHg (10/30 1027) SpO2:  [97 %-99 %] 97 % (10/30 0623)  Intake/Output from previous day: 10/29 0701 - 10/30 0700 In: 960 [P.O.:960] Out: 2050 [Urine:2050] Intake/Output from this shift: Total I/O In: 240 [P.O.:240] Out: 950 [Urine:950]  Physical Exam: Neck: no adenopathy, no carotid bruit, no JVD and supple, symmetrical, trachea midline Lungs: clear to auscultation bilaterally Heart: regular rate and rhythm, S1, S2 normal and Soft systolic murmur noted Abdomen: soft, non-tender; bowel sounds normal; no masses,  no organomegaly Extremities: No clubbing cyanosis 1+  Lab Results:  Basename 07/05/12 0525 07/04/12 0620  WBC 7.6 7.7  HGB 14.0 13.6  PLT 145* 135*    Basename 07/05/12 0525  NA 136  K 3.6  CL 102  CO2 24  GLUCOSE 117*  BUN 14  CREATININE 1.01   No results found for this basename: TROPONINI:2,CK,MB:2 in the last 72 hours Hepatic Function Panel No results found for this basename: PROT,ALBUMIN,AST,ALT,ALKPHOS,BILITOT,BILIDIR,IBILI in the last 72 hours  Basename 07/05/12 0525  CHOL 98   No results found for this basename: PROTIME in the last 72 hours  Imaging: Imaging results have been reviewed and No results found.  Cardiac Studies:  Assessment/Plan:  Recurrent atrial tachyarrhythmias with pauses rule out sick sinus syndrome/tachybradycardia syndrome  Coronary artery disease status post PCI  to RCA stable  Bilateral pulmonary embolism  DVT left leg  Hypertension  Hypercholesteremia  Morbid obesity  Obstructive sleep apnea on CPAP  Ischemic cardiomyopathy  Thrombocytopenia chronic improved Plan Continue present management Check 2-D echo Labs in a.m. Hold amiodarone for now  LOS: 4 days    Normand Damron N 07/05/2012, 12:36 PM

## 2012-07-05 NOTE — Progress Notes (Signed)
[facility] PROGRESS NOTE  Matthew Ochoa. WUJ:[id number] DOB: 1946-03-12 DOA: 07/01/2012 PCP: Willey Blade, MD  Assessment/Plan: Pulmonary embolism:  Continue heparin drip and coumadin per pharmacy. SOB continues to  Improve but remains on exertion. Coumadin started 07/02/12 per pharmacy. INR today 1.55.  Active Problems:  DIABETES, TYPE 2: fair control. CBG 128 127. Holding metforman 48hrs due to contrast. Will resume.  A1c 6.0.  HYPERTENSION: fair control.  Continuing home meds and prn appressoline . Monitor closely  COPD (chronic obstructive pulmonary disease): At baseline. No wheeze.  Atrial fibrillation: rate controlled. Amiodarone held for bradycardia and pauses. Hyperlipidemia:  Continue home meds . Of note pt refusing our substitution for crestor. CAD (coronary artery disease)No chest pain. Continue home meds.     Code Status: full Family Communication: none at bedside Disposition Plan: home with wife when INR therapeutic. Hopefully tomorrow   Consultants:  none  Procedures:  Dopplers, echo  Antibiotics:  none  HPI/Subjective:  mild sob with exertion. Denies CP.   Objective: Filed Vitals:   07/05/12 0623 07/05/12 1027 07/05/12 1332 07/05/12 1337  BP: 155/80 148/90 153/100 148/88  Pulse: 44  46   Temp: 97.6 F (36.4 C)  97.5 F (36.4 C)   TempSrc: Oral  Oral   Resp: 18  18   Height:      Weight:      SpO2: 97%  100%     Intake/Output Summary (Last 24 hours) at 07/05/12 1818 Last data filed at 07/05/12 1813  Gross per 24 hour  Intake    600 ml  Output   2500 ml  Net  -1900 ml   Filed Weights   07/02/12 0115  Weight: 121.3 kg (267 lb 6.7 oz)    Exam:   General:  Awake, alert NAD  Cardiovascular: RRR No MGR trace LEE  Respiratory: normal effort. BSCTAB no wheeze, crackles  Abdomen: obese, soft +BS non-tender to palpation.   Data Reviewed: Basic Metabolic Panel:  Lab 07/05/12 9562 07/02/12 0710 07/01/12 1717  NA 136 140 138  K  3.6 3.5 3.6  CL 102 106 103  CO2 24 25 24   GLUCOSE 117* 104* 113*  BUN 14 14 18   CREATININE 1.01 1.06 1.09  CALCIUM 9.4 8.8 9.6  MG 2.0 -- --  PHOS -- -- --   Liver Function Tests:  Lab 07/01/12 1717  AST 24  ALT 37  ALKPHOS 50  BILITOT 0.5  PROT 7.4  ALBUMIN 4.0   No results found for this basename: LIPASE:5,AMYLASE:5 in the last 168 hours No results found for this basename: AMMONIA:5 in the last 168 hours CBC:  Lab 07/05/12 0525 07/04/12 0620 07/03/12 0525 07/02/12 0710 07/02/12 0126 07/01/12 1717  WBC 7.6 7.7 7.3 7.4 -- 7.1  NEUTROABS -- -- -- -- -- 4.9  HGB 14.0 13.6 13.8 13.7 -- 14.2  HCT 38.6* 37.5* 37.9* 37.7* -- 39.4  MCV 78.8 78.9 79.1 79.2 -- 79.4  PLT 145* 135* 138* 134* 137* --   Cardiac Enzymes: No results found for this basename: CKTOTAL:5,CKMB:5,CKMBINDEX:5,TROPONINI:5 in the last 168 hours BNP (last 3 results)  Basename 07/01/12 2032  PROBNP 124.7   CBG:  Lab 07/05/12 1627 07/05/12 1123 07/05/12 0622 07/04/12 2108 07/04/12 1617  GLUCAP 97 113* 119* 141* 115*    No results found for this or any previous visit (from the past 240 hour(s)).   Studies: No results found.  Scheduled Meds:    . aspirin EC  81 mg Oral Daily  .  atorvastatin  20 mg Oral q1800  . irbesartan  300 mg Oral Daily   And  . hydrochlorothiazide  12.5 mg Oral Daily  . metFORMIN  500 mg Oral BID WC  . ramipril  10 mg Oral Daily  . senna-docusate  2 tablet Oral BID  . sodium chloride  3 mL Intravenous Q12H  . warfarin  10 mg Oral ONCE-1800  . Warfarin - Pharmacist Dosing Inpatient   Does not apply q1800  . DISCONTD: amiodarone  100 mg Oral Daily  . DISCONTD: prasugrel  10 mg Oral Daily   Continuous Infusions:    . sodium chloride 20 mL/hr at 07/02/12 1845  . heparin 1,750 Units/hr (07/04/12 1217)    Principal Problem:  *Pulmonary embolism Active Problems:  DIABETES, TYPE 2  HYPERTENSION  COPD (chronic obstructive pulmonary disease)  Atrial fibrillation  Edema  leg  Hyperlipidemia  CAD (coronary artery disease)  OSA on CPAP    Time spent: 30 minutes    Krystle Polcyn [facility]  If 8PM-8AM, please contact night-coverage at [email or web address], password [facility] 07/05/2012, 6:18 PM  LOS: 4 days

## 2012-07-06 LAB — CBC
HCT: 37.2 % — ABNORMAL LOW (ref 39.0–52.0)
Hemoglobin: 13.6 g/dL (ref 13.0–17.0)
MCH: 28.9 pg (ref 26.0–34.0)
MCHC: 36.6 g/dL — ABNORMAL HIGH (ref 30.0–36.0)
MCV: 79 fL (ref 78.0–100.0)
Platelets: 144 10*3/uL — ABNORMAL LOW (ref 150–400)
RBC: 4.71 MIL/uL (ref 4.22–5.81)
RDW: 15.8 % — ABNORMAL HIGH (ref 11.5–15.5)
WBC: 7.8 10*3/uL (ref 4.0–10.5)

## 2012-07-06 LAB — GLUCOSE, CAPILLARY
Glucose-Capillary: 120 mg/dL — ABNORMAL HIGH (ref 70–99)
Glucose-Capillary: 141 mg/dL — ABNORMAL HIGH (ref 70–99)
Glucose-Capillary: 137 mg/dL — ABNORMAL HIGH (ref 70–99)

## 2012-07-06 LAB — PROTIME-INR
INR: 2.09 — ABNORMAL HIGH (ref 0.00–1.49)
Prothrombin Time: 22.6 seconds — ABNORMAL HIGH (ref 11.6–15.2)

## 2012-07-06 LAB — HEPARIN LEVEL (UNFRACTIONATED): Heparin Unfractionated: 0.4 IU/mL (ref 0.30–0.70)

## 2012-07-06 MED ORDER — FUROSEMIDE 40 MG PO TABS
40.0000 mg | ORAL_TABLET | Freq: Every day | ORAL | Status: DC
  Administered 2012-07-06 – 2012-07-07 (×2): 40 mg via ORAL
  Filled 2012-07-06 (×2): qty 1

## 2012-07-06 MED ORDER — WARFARIN SODIUM 7.5 MG PO TABS
7.5000 mg | ORAL_TABLET | Freq: Once | ORAL | Status: AC
  Administered 2012-07-06: 7.5 mg via ORAL
  Filled 2012-07-06: qty 1

## 2012-07-06 NOTE — Progress Notes (Signed)
Subjective:  Patient denies any chest pain complaints of shortness of breath with minimal exertion also complains of bilateral leg pain overall feels better  Objective:  Vital Signs in the last 24 hours: Temp:  [97.5 F (36.4 C)-98.3 F (36.8 C)] 98.3 F (36.8 C) (10/30 1956) Pulse Rate:  [46-53] 53  (10/31 0432) Resp:  [18-19] 19  (10/31 0432) BP: (141-155)/(68-100) 141/68 mmHg (10/31 0432) SpO2:  [96 %-100 %] 98 % (10/31 0432)  Intake/Output from previous day: 10/30 0701 - 10/31 0700 In: 600 [P.O.:600] Out: 2500 [Urine:2500] Intake/Output from this shift: Total I/O In: 360 [P.O.:360] Out: 800 [Urine:800]  Physical Exam: Neck: no adenopathy, no carotid bruit, no JVD and supple, symmetrical, trachea midline Lungs: clear to auscultation bilaterally Heart: regular rate and rhythm, S1, S2 normal and Soft systolic murmur noted Abdomen: soft, non-tender; bowel sounds normal; no masses,  no organomegaly Extremities: No clubbing cyanosis 1+ edema noted  Lab Results:  Basename 07/06/12 0431 07/05/12 0525  WBC 7.8 7.6  HGB 13.6 14.0  PLT 144* 145*    Basename 07/05/12 0525  NA 136  K 3.6  CL 102  CO2 24  GLUCOSE 117*  BUN 14  CREATININE 1.01   No results found for this basename: TROPONINI:2,CK,MB:2 in the last 72 hours Hepatic Function Panel No results found for this basename: PROT,ALBUMIN,AST,ALT,ALKPHOS,BILITOT,BILIDIR,IBILI in the last 72 hours  Basename 07/05/12 0525  CHOL 98   No results found for this basename: PROTIME in the last 72 hours  Imaging: Imaging results have been reviewed and No results found.  Cardiac Studies:  Assessment/Plan:  Recurrent atrial tachyarrhythmias with pauses rule out sick sinus syndrome/tachybradycardia syndrome  Coronary artery disease status post PCI to RCA stable  Bilateral pulmonary embolism  DVT left leg  Hypertension  Hypercholesteremia  Morbid obesity  Obstructive sleep apnea on CPAP  Ischemic cardiomyopathy    Thrombocytopenia chronic improved Plan Add low-dose Lasix as per orders DC hydrochlorothiazide  LOS: 5 days    Kaj Vasil N 07/06/2012, 9:34 AM

## 2012-07-06 NOTE — Progress Notes (Signed)
ANTICOAGULATION CONSULT NOTE - Follow Up Consult  Pharmacy Consult for heparin and coumadin Indication: DVT and bilateral PEs  No Known Allergies  Patient Measurements: Height: 5\' 10"  (177.8 cm) Weight: 267 lb 6.7 oz (121.3 kg) IBW/kg (Calculated) : 73  Heparin Dosing Weight: 100kg  Vital Signs: Temp src: Oral (10/31 0432) BP: 141/68 mmHg (10/31 0432) Pulse Rate: 53  (10/31 0432)  Labs:  Basename 07/06/12 0431 07/05/12 0525 07/04/12 0620  HGB 13.6 14.0 --  HCT 37.2* 38.6* 37.5*  PLT 144* 145* 135*  APTT -- -- --  LABPROT 22.6* 18.1* 15.4*  INR 2.09* 1.55* 1.24  HEPARINUNFRC 0.40 0.43 0.32  CREATININE -- 1.01 --  CKTOTAL -- -- --  CKMB -- -- --  TROPONINI -- -- --    Estimated Creatinine Clearance: 95.2 ml/min (by C-G formula based on Cr of 1.01).  Assessment: Patient is a 66 y.o M on anticoagulation overlap day #5/5.  Both Heparin level and INR are therapeutic this morning.  Noticeable INR increase from 1.55 to 2.09 today is likely residual effect of interaction with amiodarone.   Goal of Therapy:  Heparin level 0.3-0.7 units/ml Monitor platelets by anticoagulation protocol: Yes   Plan:  1) Continue heparin at 1750 units/hr.  Since patient has bilateral PE, recommend to continue heparin through today to get 24 hr overlap with therapeutic INR 2) Reduce Coumadin to 7.5 mg po x 1 dose   Carolee Channell P 07/06/2012,9:25 AM

## 2012-07-06 NOTE — Progress Notes (Signed)
[facility] PROGRESS NOTE  Matthew Ochoa. ZOX:[id number] DOB: 1946/05/10 DOA: 07/01/2012 PCP: Willey Blade, MD  Assessment/Plan: Pulmonary embolism:  Continue heparin drip and coumadin per pharmacy, day 5 of heparin drip.  SOB continues to  Improve but remains on exertion. Coumadin started 07/02/12 per pharmacy. INR today is 2.09.   Active Problems:  DIABETES, TYPE 2: fair control. CBG 128 127. Holding metforman 48hrs due to contrast. Will resume.  A1c 6.0.  CBG (last 3)   Basename 07/06/12 1125 07/06/12 0633 07/05/12 2054  GLUCAP 141* 120* 138*     HYPERTENSION: fair control.  Continuing home meds and prn appressoline . Monitor closely  COPD (chronic obstructive pulmonary disease): At baseline. No wheeze.  Atrial fibrillation: rate controlled. Amiodarone held for bradycardia and pauses. Hyperlipidemia:  Continue home meds . Of note pt refusing our substitution for crestor. CAD (coronary artery disease)No chest pain. Continue home meds.     Code Status: full Family Communication: wife at bedside Disposition Plan: home with wife when INR therapeutic. Hopefully tomorrow   Consultants:  none  Procedures:  Dopplers, echo  Antibiotics:  none  HPI/Subjective:  mild sob with exertion. Denies CP.   Objective: Filed Vitals:   07/06/12 0432 07/06/12 1346 07/06/12 1351 07/06/12 1605  BP: 141/68 166/92 159/90 140/78  Pulse: 53 46    Temp:  97.6 F (36.4 C)    TempSrc: Oral Oral    Resp: 19 18    Height:      Weight:      SpO2: 98% 99%      Intake/Output Summary (Last 24 hours) at 07/06/12 1620 Last data filed at 07/06/12 1353  Gross per 24 hour  Intake    720 ml  Output   3301 ml  Net  -2581 ml   Filed Weights   07/02/12 0115  Weight: 121.3 kg (267 lb 6.7 oz)    Exam:   General:  Awake, alert NAD  Cardiovascular: RRR No MGR trace LEE  Respiratory: normal effort. BSCTAB no wheeze, crackles  Abdomen: obese, soft +BS non-tender to palpation.    Data Reviewed: Basic Metabolic Panel:  Lab 07/05/12 8119 07/02/12 0710 07/01/12 1717  NA 136 140 138  K 3.6 3.5 3.6  CL 102 106 103  CO2 24 25 24   GLUCOSE 117* 104* 113*  BUN 14 14 18   CREATININE 1.01 1.06 1.09  CALCIUM 9.4 8.8 9.6  MG 2.0 -- --  PHOS -- -- --   Liver Function Tests:  Lab 07/01/12 1717  AST 24  ALT 37  ALKPHOS 50  BILITOT 0.5  PROT 7.4  ALBUMIN 4.0   No results found for this basename: LIPASE:5,AMYLASE:5 in the last 168 hours No results found for this basename: AMMONIA:5 in the last 168 hours CBC:  Lab 07/06/12 0431 07/05/12 0525 07/04/12 0620 07/03/12 0525 07/02/12 0710 07/01/12 1717  WBC 7.8 7.6 7.7 7.3 7.4 --  NEUTROABS -- -- -- -- -- 4.9  HGB 13.6 14.0 13.6 13.8 13.7 --  HCT 37.2* 38.6* 37.5* 37.9* 37.7* --  MCV 79.0 78.8 78.9 79.1 79.2 --  PLT 144* 145* 135* 138* 134* --   Cardiac Enzymes: No results found for this basename: CKTOTAL:5,CKMB:5,CKMBINDEX:5,TROPONINI:5 in the last 168 hours BNP (last 3 results)  Basename 07/01/12 2032  PROBNP 124.7   CBG:  Lab 07/06/12 1125 07/06/12 0633 07/05/12 2054 07/05/12 1627 07/05/12 1123  GLUCAP 141* 120* 138* 97 113*    No results found for this or any previous visit (  from the past 240 hour(s)).   Studies: No results found.  Scheduled Meds:    . aspirin EC  81 mg Oral Daily  . atorvastatin  20 mg Oral q1800  . furosemide  40 mg Oral Daily  . irbesartan  300 mg Oral Daily  . metFORMIN  500 mg Oral BID WC  . ramipril  10 mg Oral Daily  . senna-docusate  2 tablet Oral BID  . sodium chloride  3 mL Intravenous Q12H  . warfarin  10 mg Oral ONCE-1800  . warfarin  7.5 mg Oral ONCE-1800  . Warfarin - Pharmacist Dosing Inpatient   Does not apply q1800  . DISCONTD: hydrochlorothiazide  12.5 mg Oral Daily   Continuous Infusions:    . sodium chloride 20 mL/hr at 07/02/12 1845  . heparin 1,750 Units/hr (07/06/12 1146)    Principal Problem:  *Pulmonary embolism Active Problems:   DIABETES, TYPE 2  HYPERTENSION  COPD (chronic obstructive pulmonary disease)  Atrial fibrillation  Edema leg  Hyperlipidemia  CAD (coronary artery disease)  OSA on CPAP    Time spent: 30 minutes    Matthew Ochoa [facility]  If 8PM-8AM, please contact night-coverage at [email or web address], password [facility] 07/06/2012, 4:20 PM  LOS: 5 days

## 2012-07-07 LAB — CBC
HCT: 38 % — ABNORMAL LOW (ref 39.0–52.0)
Hemoglobin: 13.8 g/dL (ref 13.0–17.0)
MCH: 28.9 pg (ref 26.0–34.0)
MCHC: 36.3 g/dL — ABNORMAL HIGH (ref 30.0–36.0)
MCV: 79.5 fL (ref 78.0–100.0)
Platelets: 147 10*3/uL — ABNORMAL LOW (ref 150–400)
RBC: 4.78 MIL/uL (ref 4.22–5.81)
RDW: 15.8 % — ABNORMAL HIGH (ref 11.5–15.5)
WBC: 7.9 10*3/uL (ref 4.0–10.5)

## 2012-07-07 LAB — PROTIME-INR
INR: 2.39 — ABNORMAL HIGH (ref 0.00–1.49)
Prothrombin Time: 25 seconds — ABNORMAL HIGH (ref 11.6–15.2)

## 2012-07-07 LAB — GLUCOSE, CAPILLARY
Glucose-Capillary: 159 mg/dL — ABNORMAL HIGH (ref 70–99)
Glucose-Capillary: 118 mg/dL — ABNORMAL HIGH (ref 70–99)

## 2012-07-07 LAB — HEPARIN LEVEL (UNFRACTIONATED): Heparin Unfractionated: 0.5 IU/mL (ref 0.30–0.70)

## 2012-07-07 MED ORDER — WARFARIN SODIUM 7.5 MG PO TABS
7.5000 mg | ORAL_TABLET | Freq: Once | ORAL | Status: DC
  Filled 2012-07-07: qty 1

## 2012-07-07 MED ORDER — SENNOSIDES-DOCUSATE SODIUM 8.6-50 MG PO TABS: 2.0000 | ORAL_TABLET | Freq: Two times a day (BID) | ORAL | Status: DC

## 2012-07-07 MED ORDER — LOSARTAN POTASSIUM 100 MG PO TABS: 100.0000 mg | ORAL_TABLET | Freq: Every day | ORAL | Status: DC

## 2012-07-07 MED ORDER — WARFARIN SODIUM 5 MG PO TABS: 5.0000 mg | ORAL_TABLET | Freq: Every day | ORAL | Status: DC

## 2012-07-07 MED ORDER — AMIODARONE HCL 100 MG PO TABS: 100.0000 mg | ORAL_TABLET | ORAL | Status: DC

## 2012-07-07 MED ORDER — TELMISARTAN 80 MG PO TABS: 80.0000 mg | ORAL_TABLET | Freq: Every day | ORAL | Status: DC

## 2012-07-07 MED ORDER — FUROSEMIDE 40 MG PO TABS: 40.0000 mg | ORAL_TABLET | Freq: Every day | ORAL | Status: DC

## 2012-07-07 MED ORDER — POTASSIUM CHLORIDE ER 8 MEQ PO TBCR: 8.0000 meq | EXTENDED_RELEASE_TABLET | Freq: Every day | ORAL | Status: DC

## 2012-07-07 MED ORDER — WARFARIN SODIUM 5 MG PO TABS: 7.5000 mg | ORAL_TABLET | Freq: Every day | ORAL | Status: DC

## 2012-07-07 NOTE — Progress Notes (Signed)
Subjective:  Patient denies any chest pain or shortness of breath. Tolerating Coumadin okay no further episodes of significant pauses. INR remains in therapeutic range  Objective:  Vital Signs in the last 24 hours: Temp:  [97.6 F (36.4 C)-98.2 F (36.8 C)] 98.2 F (36.8 C) (11/01 0630) Pulse Rate:  [44-47] 44  (11/01 0630) Resp:  [17-18] 17  (10/31 2302) BP: (140-166)/(78-98) 148/90 mmHg (11/01 1021) SpO2:  [97 %-99 %] 97 % (10/31 2302)  Intake/Output from previous day: 10/31 0701 - 11/01 0700 In: 960 [P.O.:960] Out: 3401 [Urine:3400; Stool:1] Intake/Output from this shift: Total I/O In: 3 [I.V.:3] Out: -   Physical Exam: Neck: no adenopathy, no carotid bruit, no JVD and supple, symmetrical, trachea midline Lungs: clear to auscultation bilaterally Heart: regular rate and rhythm, S1, S2 normal and Soft systolic murmur noted no S3 gallop Abdomen: soft, non-tender; bowel sounds normal; no masses,  no organomegaly Extremities: No clubbing cyanosis 1+ edema noted  Lab Results:  Basename 07/07/12 0543 07/06/12 0431  WBC 7.9 7.8  HGB 13.8 13.6  PLT 147* 144*    Basename 07/05/12 0525  NA 136  K 3.6  CL 102  CO2 24  GLUCOSE 117*  BUN 14  CREATININE 1.01   No results found for this basename: TROPONINI:2,CK,MB:2 in the last 72 hours Hepatic Function Panel No results found for this basename: PROT,ALBUMIN,AST,ALT,ALKPHOS,BILITOT,BILIDIR,IBILI in the last 72 hours  Basename 07/05/12 0525  CHOL 98   No results found for this basename: PROTIME in the last 72 hours  Imaging: Imaging results have been reviewed and No results found.  Cardiac Studies:  Assessment/Plan:  Status post Recurrent atrial tachyarrhythmias with pauses rule out sick sinus syndrome/tachybradycardia syndrome  Coronary artery disease status post PCI to RCA stable  Bilateral pulmonary embolism  DVT left leg  Hypertension  Hypercholesteremia  Morbid obesity  Obstructive sleep apnea on CPAP    Ischemic cardiomyopathy  Thrombocytopenia chronic improved  Plan Continue present management okay to discharge from cardiac point of view Restart amiodarone 100 mg 3 days a week Followup with me in one to 2 weeks Check PT INR in one week  LOS: 6 days    Deetra Booton N 07/07/2012, 11:20 AM

## 2012-07-07 NOTE — Progress Notes (Signed)
ANTICOAGULATION CONSULT NOTE - Follow Up Consult  Pharmacy Consult for heparin and coumadin Indication: DVT and bilateral PEs  No Known Allergies  Patient Measurements: Height: 5\' 10"  (177.8 cm) Weight: 267 lb 6.7 oz (121.3 kg) IBW/kg (Calculated) : 73  Heparin Dosing Weight: 100kg  Vital Signs: Temp: 98.2 F (36.8 C) (11/01 0630) Temp src: Oral (11/01 0630) BP: 140/94 mmHg (11/01 0630) Pulse Rate: 44  (11/01 0630)  Labs:  Basename 07/07/12 0543 07/06/12 0431 07/05/12 0525  HGB 13.8 13.6 --  HCT 38.0* 37.2* 38.6*  PLT 147* 144* 145*  APTT -- -- --  LABPROT 25.0* 22.6* 18.1*  INR 2.39* 2.09* 1.55*  HEPARINUNFRC 0.50 0.40 0.43  CREATININE -- -- 1.01  CKTOTAL -- -- --  CKMB -- -- --  TROPONINI -- -- --    Estimated Creatinine Clearance: 95.2 ml/min (by C-G formula based on Cr of 1.01).  Assessment: Patient is a 66 y.o M on anticoagulation overlap day #6/5.  Both Heparin level and INR are therapeutic this morning and INR has been therapeutic x 2 days. H/H/Plts remain stable from baseline. Noted that pt was on amio PTA up until 10/29. Amiodarone's long half life could be causing some residual effect on the INR but pt has had sufficient heparin/coumadin overlap.   Goal of Therapy:  Heparin level 0.3-0.7 units/ml Monitor platelets by anticoagulation protocol: Yes   Plan:  D/C heparin  Coumadin 7.5mg  po x 1 dose tonight  F/u INR in the AM   Thank you,  Brett Fairy, PharmD 07/07/2012 8:57 AM

## 2012-07-07 NOTE — Progress Notes (Signed)
D/c instructions provided along with medlist and scripts provided. IVs d/c'd with catheter intact. Patient ambulated with family to main lobby for d/c home. Mamie Levers

## 2012-07-09 NOTE — Discharge Summary (Addendum)
Physician Discharge Summary  Matthew Ochoa. JXB:[id number] DOB: 08-Jul-1946 DOA: 07/01/2012  PCP: Willey Blade, MD  Admit date: 07/01/2012 Discharge date: 07/09/2012  Time spent: 52 minutes  Recommendations for Outpatient Follow-up:  1. 1. INR check on Monday 2. Follow up with Dr Sharyn Lull as recommended  Discharge Diagnoses:  Principal Problem:  *Pulmonary embolism Active Problems:  DIABETES, TYPE 2  HYPERTENSION  COPD (chronic obstructive pulmonary disease)  Atrial fibrillation  Edema leg  Hyperlipidemia  CAD (coronary artery disease)  OSA on CPAP   Discharge Condition: stable  Diet recommendation: carb modified diet  Filed Weights   07/02/12 0115  Weight: 121.3 kg (267 lb 6.7 oz)    History of present illness:  Matthew Ochoa. is a 66 y.o. male who presents to the ED with complaints of worsening SOB X 1 day. He denies any chest pain or fevers or chills. He reports having a cough and coughing up clear mucus. In the ED a D-dimer was performed and returned positive, and then a CTA of the chest were performed which revealed Pulmonary emboli. Patient is on Effient, he denies missing any doses of medications,. He denies any leg swelling, or trauma to his extremities. He also denies any prolonged immobility or travel. He also denies any pleuritic chest pain. He does have a history of atrial fibrillation and CAD and is on amiodarone and Effient. He denies any previous history of blood clots and denies any family medical history of bleeding disorders.    Hospital Course:   Pulmonary embolism and DVT of the left popliteal and femoral veins: bilateral PE'S found. He was started on heparin and coumadin, his INR on discharge is 2.39. He was discharged on 7.5 mg of coumadin and recommended to get INR checked on Monday and results sent to Dr Sharyn Lull and Dr [name].   DIABETES, TYPE 2: fair control. CBG 128 127. Resume metformin CBG (last 3)   Basename  07/06/12 1125  07/06/12 0633   07/05/12 2054   GLUCAP  141*  120*  138*    HYPERTENSION: controlled. His HCTZ was stopped and started on lasix.  COPD (chronic obstructive pulmonary disease): At baseline. No wheeze.  Atrial fibrillation: rate controlled. Amiodarone held for bradycardia and pauses, which was later resumed at a lower dose every other day. He is also recommended that if his HR is less than 50, then he should hold his amiodarone and call Dr Sharyn Lull.   Hyperlipidemia: Continue home meds .  CAD (coronary artery disease)No chest pain. Continue home meds.      Consultations:  Cardiology consult from Dr Sharyn Lull  Discharge Exam: Filed Vitals:   07/06/12 1605 07/06/12 2302 07/07/12 0630 07/07/12 1021  BP: 140/78 158/98 140/94 148/90  Pulse:  47 44   Temp:  98 F (36.7 C) 98.2 F (36.8 C)   TempSrc:  Oral Oral   Resp:  17    Height:      Weight:      SpO2:  97%      General: alert afebrile comfortable Cardiovascular: s1s2 HEARD Respiratory: CTAB, no wheezing or rhonchi Abdomen: soft NT ND BS+ Extremities: trace pedal edema  Discharge Instructions  Discharge Orders    Future Appointments: Provider: Department: Dept Phone: Center:   07/21/2012 3:30 PM Waymon Budge, MD [facility] [phone] None     Future Orders Please Complete By Expires   Diet - low sodium heart healthy      Discharge instructions  Comments:   Follow up with Dr Sharyn Lull in one week. Check INR on Monday and BMP on Monday.   Activity as tolerated - No restrictions          Medication List     As of 07/09/2012 10:03 PM    STOP taking these medications         EFFIENT 10 MG Tabs   Generic drug: prasugrel      telmisartan-hydrochlorothiazide 80-12.5 MG per tablet   Commonly known as: MICARDIS HCT      TAKE these medications         amiodarone 100 MG tablet   Commonly known as: PACERONE   Take 1 tablet (100 mg total) by mouth every other day.      aspirin 81 MG tablet   Take 81 mg by mouth  daily.      benzonatate 200 MG capsule   Commonly known as: TESSALON   Take 1 capsule (200 mg total) by mouth every 8 (eight) hours as needed for cough.      COMBIVENT 18-103 MCG/ACT inhaler   Generic drug: albuterol-ipratropium   Inhale 2 puffs into the lungs 4 (four) times daily as needed. For shortness of breath      CRESTOR 10 MG tablet   Generic drug: rosuvastatin   Take 1 tablet by mouth daily.      furosemide 40 MG tablet   Commonly known as: LASIX   Take 1 tablet (40 mg total) by mouth daily.      losartan 100 MG tablet   Commonly known as: COZAAR   Take 1 tablet (100 mg total) by mouth daily.      metFORMIN 500 MG tablet   Commonly known as: GLUCOPHAGE   Take 1 tablet by mouth 2 (two) times daily with a meal.      nitroGLYCERIN 0.4 MG SL tablet   Commonly known as: NITROSTAT   Place 0.4 mg under the tongue every 5 (five) minutes as needed.      ramipril 10 MG capsule   Commonly known as: ALTACE   Take 10 mg by mouth daily.      senna-docusate 8.6-50 MG per tablet   Commonly known as: Senokot-S   Take 2 tablets by mouth 2 (two) times daily.      SPIRIVA HANDIHALER 18 MCG inhalation capsule   Generic drug: tiotropium   Place 18 mcg into inhaler and inhale daily.      traMADol 50 MG tablet   Commonly known as: ULTRAM   Take 1 tablet (50 mg total) by mouth every 6 (six) hours as needed for pain.      warfarin 5 MG tablet   Commonly known as: COUMADIN   Take 1.5 tablets (7.5 mg total) by mouth daily.           Follow-up Information    Follow up with [name], MD. Schedule an appointment as soon as possible for a visit in 1 week.   Contact information:   [address] [place] [address] [phone]       Follow up with [name], [name], MD. Schedule an appointment as soon as possible for a visit in 2 weeks.   Contact information:   [address] [place] [address] [phone]           The results of  significant diagnostics from this hospitalization (including imaging, microbiology, ancillary and laboratory) are listed below for reference.  Significant Diagnostic Studies: Dg Chest 2 View  07/01/2012  *RADIOLOGY REPORT*  Clinical Data: 66 year old male weakness shortness of breath cough.  CHEST - 2 VIEW  Comparison: 06/06/2012 and earlier.  Findings: Larger lung volumes. Stable cardiomegaly and mediastinal contours.  No pneumothorax.  No pulmonary edema.  No pleural effusion or consolidation.  Stable eventration of the diaphragm. Stable small calcified AP window lymph nodes and probable calcified left upper lobe granuloma nearby.  No acute pulmonary opacity. No acute osseous abnormality identified.  IMPRESSION: Stable. No acute cardiopulmonary abnormality.   Original Report Authenticated By: Harley Hallmark, M.D.    Ct Angio Chest Pe W/cm &/or Wo Cm  07/01/2012  *RADIOLOGY REPORT*  Clinical Data: Shortness of breath and bilateral leg weakness. Chest pain.  CT ANGIOGRAPHY CHEST  Technique:  Multidetector CT imaging of the chest using the standard protocol during bolus administration of intravenous contrast. Multiplanar reconstructed images including MIPs were obtained and reviewed to evaluate the vascular anatomy.  Contrast: OMNIPAQUE IOHEXOL 350 MG/ML SOLN  Comparison: None.  Findings: Technically adequate study with good opacification of the central and segmental pulmonary arteries.  There are filling defects in the distal main pulmonary arteries bilaterally, extending out into multiple upper and lower lobe segmental branches bilaterally.  Changes are consistent with bilateral central and segmental pulmonary emboli.  Mild cardiac enlargement.  Coronary artery calcification.  Normal caliber thoracic aorta.  No significant lymphadenopathy in the chest.  Calcification of the aorta.  Esophagus is decompressed. Visualized upper abdominal organs suggests calcified granulomas in the spleen and a  probable cyst in the right kidney.  No pleural effusions.  Respiratory motion artifact in the lungs limits visualization.  No significant airspace consolidation. Airways appear patent.  No pneumothorax.  IMPRESSION: Positive study for bilateral central and segmental pulmonary emboli.  Results were discussed by telephone with Dr. Freida Busman at 2337 hours on 07/01/2012.   Original Report Authenticated By: Marlon Pel, M.D.     Microbiology: No results found for this or any previous visit (from the past 240 hour(s)).   Labs: Basic Metabolic Panel:  Lab 07/05/12 1610  NA 136  K 3.6  CL 102  CO2 24  GLUCOSE 117*  BUN 14  CREATININE 1.01  CALCIUM 9.4  MG 2.0  PHOS --   Liver Function Tests: No results found for this basename: AST:5,ALT:5,ALKPHOS:5,BILITOT:5,PROT:5,ALBUMIN:5 in the last 168 hours No results found for this basename: LIPASE:5,AMYLASE:5 in the last 168 hours No results found for this basename: AMMONIA:5 in the last 168 hours CBC:  Lab 07/07/12 0543 07/06/12 0431 07/05/12 0525 07/04/12 0620 07/03/12 0525  WBC 7.9 7.8 7.6 7.7 7.3  NEUTROABS -- -- -- -- --  HGB 13.8 13.6 14.0 13.6 13.8  HCT 38.0* 37.2* 38.6* 37.5* 37.9*  MCV 79.5 79.0 78.8 78.9 79.1  PLT 147* 144* 145* 135* 138*   Cardiac Enzymes: No results found for this basename: CKTOTAL:5,CKMB:5,CKMBINDEX:5,TROPONINI:5 in the last 168 hours BNP: BNP (last 3 results)  Basename 07/01/12 2032  PROBNP 124.7   CBG:  Lab 07/07/12 0628 07/06/12 2309 07/06/12 1619 07/06/12 1125 07/06/12 0633  GLUCAP 118* 159* 137* 141* 120*       Signed:  Niamh Rada  [facility] 07/09/2012, 10:03 PM

## 2012-07-10 ENCOUNTER — Ambulatory Visit: Payer: Medicare Other | Admitting: Internal Medicine

## 2012-07-11 ENCOUNTER — Other Ambulatory Visit: Payer: Self-pay | Admitting: Internal Medicine

## 2012-07-21 ENCOUNTER — Encounter: Payer: Self-pay | Admitting: Internal Medicine

## 2012-07-21 ENCOUNTER — Ambulatory Visit (INDEPENDENT_AMBULATORY_CARE_PROVIDER_SITE_OTHER): Payer: Medicare Other | Admitting: Internal Medicine

## 2012-07-21 VITALS — BP 138/70 | HR 62 | Ht 71.0 in | Wt 270.6 lb

## 2012-07-21 DIAGNOSIS — I2699 Other pulmonary embolism without acute cor pulmonale: Secondary | ICD-10-CM

## 2012-07-21 DIAGNOSIS — G4733 Obstructive sleep apnea (adult) (pediatric): Secondary | ICD-10-CM

## 2012-07-21 NOTE — Patient Instructions (Addendum)
Order- DME Matthew Ochoa  autotitrate x 7 days for pressure recommendation. We will then use that pressure on his current machine                                Replacement CPAP mask of choice and supplies. Try a full-face mask                                                                  Dx OSA

## 2012-07-21 NOTE — Progress Notes (Signed)
07/30/11- 55 yoM former smoker previously followed 2009 for OSA, and coming now at the request of his cardiologist, Dr. Sharyn Lull, to evaluate for dyspnea. PCP Dr [name]. He has been aware of shortness of breath for about 3 months without definite onset. He was noted to have irregular heartbeat and ultimately had catheterization and cardiac stent August 7, without infarction. He is now in cardiac rehabilitation. He notices dyspnea on exertion while walking. [place] for complaint of dyspnea and treated there with inhalers and prednisone. He feels stable now except that he remains more easily short of breath with brisk walking. He is comfortable sitting and supine. Has had flu shot. Denies cough, wheeze, chest pain, palpitation or swelling. He had smoked one pack per day for 30 years, quitting in June of 2012. Office PFT 11/05/2002 had recorded FVC 3500 (74%) FEV1 2800 (75%) FEV1/FVC 0.80 with no response to bronchodilator. Medical treatment has been for high blood pressure, atrial fibrillation, diabetes and obstructive sleep apnea. He denies history of heart failure or pulmonary embolism. OSA- NPSG 03/17/98- AHI 32/hr; weight was 270 lbs. he has worn CPAP with variable long-term compliance and control. He says sometimes the mask is "bothersome" because of postnasal drainage and he is evasive about whether he has been using it recently. Pressure had been set at 14.  09/03/11-  65 yoM former smoker with dyspnea, hx OSA/CPAP, rhinitis . PCP Dr [name]. He remains off of cigarettes after quitting in June of 2012. Had flu shot. Was coughing at night but Robitussin helped. At times he coughed until he retched. Denies history of GERD. Last chest x-ray was at [facility] within the last several months and he was told it was "good". He uses oxygen off and on, now but cough is improved. PFT-08/09/2011-mild obstructive airways disease with insignificant response to  bronchodilator, mild restriction, mild reduction of DLCO FEV1/FVC 0.65, DLCO 69%. TLC 71%. 6 minute walk test 08/09/2011-96% on room air to start, falling to 88% on room air by the end of his walk. Blood pressure rose to 170/110, pulse 112. After 2 minutes recovery oxygen saturation on room air at rest was 94%. He walk 420 m. We discussed significant hypertension and significant oxygen desaturation with exertion.   12/07/2011 Acute OV  Complains of continuous cough with white mucus, increased SOB x2+ months - worse x 1-2 weeks, almost makes him vomit.  Patient complains that he has a persistent cough over the last 2-3 months. He has been seen by his cardiologist recently and given antibiotic without much relief. Has taken several doses of Robitussin-DM and Mucinex without much relief. He was also, cough, and Tessalon, without any relief. Patient reports that he is no longer taking Spiriva because it did not help and his cough continued despite stopping Spiriva. He denies any hemoptysis, chest pain, orthopnea, PND, or leg swelling.  01/07/12-65 yoM former smoker with dyspnea, hx OSA/CPAP, rhinitis . PCP Dr [name]. Complains of throat tickle with cough for 3 months. No definite onset. White phlegm. No wheeze. He was given a sinus medication, Mucinex, prednisone and benzonatate. These helped for 2 weeks. Always chewing a toothpick but denies aspiration.  02/22/12- 8 yoM former smoker with dyspnea, hx OSA, rhinitis, complicated by DM, AFib/ amiodarone . PCP Dr [name]. Does not wear CPAP 14/ [employer]-"makes cough worse" I discussed his ACE inhibitor Ramipril. He had already discussed this with his cardiologist but he insists he was coughing about the same long  before this drug was started. Benzonatate works well. CXR 12/09/11-reviewed Findings: The heart size and pulmonary vascularity are normal.  Lungs are clear. Calcified lymph nodes in the aortopulmonary  window, unchanged. No acute osseous abnormality.    IMPRESSION:  No acute abnormalities.  Original Report Authenticated By: Gwynn Burly, M.D.   06/06/12- 55 yoM former smoker with dyspnea, hx OSA, rhinitis, complicated by DM, AFib/ amiodarone .  PCP Dr [name]. States he is wearing CPAP 14/ [employer] every night for approximately 3-4 hours; feels like nose feels funny at times from the pressure. Plan reduce CPAP to 12. Cough tends to come back, and usually not productive. We discussed workup. CT scan causes claustrophobia. Will get flu shot at work place. CXR 05/31/12- reviewed with him. IMPRESSION:  Question developing air space disease at the left lung base.  Original Report Authenticated By: Reyes Ivan, M.D.   07/21/12- 33 yoM former smoker with dyspnea, hx OSA, rhinitis, complicated by DM, AFib/ amiodarone .  PCP Dr [name]. FOLLOWS FOR: Wears CPAP some nights for approximately 3 hours. Did better with CPAP while in hosptial. Hospitalized October 26 through November 3 with left leg DVT and pulmonary embolism despite being already on Effient for AFib. We reviewed his x-rays noting subsequent clearing of the September 25 airspace disease. Now on Coumadin. Cough is improved. We discussed potential aggravating role of ramipril as an ACE inhibitor. Tramadol did help  cough but Dr. Sharyn Lull asked him not to use it. CPAP 12/ [employer] [facility] is well tolerated until he gets tired of it around 3 AM. Stuffy nose wakes him.  ROS-see HPI Constitutional:   No-   weight loss, night sweats, fevers, chills, fatigue, lassitude. HEENT:   No-  headaches, difficulty swallowing, tooth/dental problems, sore throat,       No-  sneezing, itching, ear ache, +nasal congestion, post nasal drip,  CV:  No-   chest pain, orthopnea, PND, swelling in lower extremities, anasarca, dizziness, palpitations Resp: No-   shortness of breath with exertion or at rest.              No-  productive cough,  + non-productive cough,  No- coughing up of blood.              No-   change in  color of mucus.  No- wheezing.   Skin: No-   rash or lesions. GI:  No-   heartburn, indigestion, abdominal pain, nausea, vomiting,  GU: . MS:  No-   joint pain or swelling.  . Neuro-     nothing unusual Psych:  No- change in mood or affect. No depression or anxiety.  No memory loss.  OBJ- Physical Exam General- Alert, Oriented, Affect-appropriate, Distress- none acute, overweight,  Always a toothpick Skin- rash-none, lesions- none, excoriation- none Lymphadenopathy- none Head- atraumatic            Eyes- Gross vision intact, PERRLA, conjunctivae and secretions clear            Ears- Hearing, canals-normal            Nose- + mild turbinate edema, no-Septal dev, mucus, polyps, erosion, perforation             Throat- Mallampati II , mucosa clear , drainage- none, tonsils- atrophic, chewing toothpick Neck- flexible , trachea midline, no stridor , thyroid nl, carotid no bruit Chest - symmetrical excursion , unlabored           Heart/CV- RRR , no murmur ,  no gallop  , no rub, nl s1 s2                           - JVD- none , edema- none, stasis changes- none, varices- none           Lung- clear to P&A, wheeze- none, cough- none , dullness-none, rub- none           Chest wall-  Abd-  Br/ Gen/ Rectal- Not done, not indicated Extrem- cyanosis- none, clubbing, none, atrophy- none, strength- nl Neuro- grossly intact to observation

## 2012-07-30 NOTE — Assessment & Plan Note (Signed)
Educated on risks for venous thromboembolic disease and most common therapies. He is using warfarin.

## 2012-08-11 ENCOUNTER — Emergency Department (HOSPITAL_COMMUNITY)
Admission: EM | Admit: 2012-08-11 | Discharge: 2012-08-11 | Disposition: A | Discharge status: Home / Self Care | Payer: Medicare Other | Attending: Emergency Medicine | Admitting: Emergency Medicine

## 2012-08-11 ENCOUNTER — Emergency Department (HOSPITAL_COMMUNITY): Payer: Medicare Other

## 2012-08-11 ENCOUNTER — Encounter (HOSPITAL_COMMUNITY): Payer: Self-pay

## 2012-08-11 DIAGNOSIS — R05 Cough: Secondary | ICD-10-CM

## 2012-08-11 DIAGNOSIS — J4489 Other specified chronic obstructive pulmonary disease: Secondary | ICD-10-CM | POA: Insufficient documentation

## 2012-08-11 DIAGNOSIS — Z87891 Personal history of nicotine dependence: Secondary | ICD-10-CM | POA: Insufficient documentation

## 2012-08-11 DIAGNOSIS — Z8719 Personal history of other diseases of the digestive system: Secondary | ICD-10-CM | POA: Insufficient documentation

## 2012-08-11 DIAGNOSIS — R042 Hemoptysis: Secondary | ICD-10-CM | POA: Insufficient documentation

## 2012-08-11 DIAGNOSIS — R053 Chronic cough: Secondary | ICD-10-CM

## 2012-08-11 DIAGNOSIS — E119 Type 2 diabetes mellitus without complications: Secondary | ICD-10-CM | POA: Insufficient documentation

## 2012-08-11 DIAGNOSIS — R791 Abnormal coagulation profile: Secondary | ICD-10-CM | POA: Insufficient documentation

## 2012-08-11 DIAGNOSIS — Z9861 Coronary angioplasty status: Secondary | ICD-10-CM | POA: Insufficient documentation

## 2012-08-11 DIAGNOSIS — Z79899 Other long term (current) drug therapy: Secondary | ICD-10-CM | POA: Insufficient documentation

## 2012-08-11 DIAGNOSIS — I10 Essential (primary) hypertension: Secondary | ICD-10-CM | POA: Insufficient documentation

## 2012-08-11 DIAGNOSIS — Z7901 Long term (current) use of anticoagulants: Secondary | ICD-10-CM | POA: Insufficient documentation

## 2012-08-11 DIAGNOSIS — J449 Chronic obstructive pulmonary disease, unspecified: Secondary | ICD-10-CM | POA: Insufficient documentation

## 2012-08-11 DIAGNOSIS — Z7982 Long term (current) use of aspirin: Secondary | ICD-10-CM | POA: Insufficient documentation

## 2012-08-11 DIAGNOSIS — E785 Hyperlipidemia, unspecified: Secondary | ICD-10-CM | POA: Insufficient documentation

## 2012-08-11 DIAGNOSIS — N401 Enlarged prostate with lower urinary tract symptoms: Secondary | ICD-10-CM | POA: Insufficient documentation

## 2012-08-11 DIAGNOSIS — G4730 Sleep apnea, unspecified: Secondary | ICD-10-CM | POA: Insufficient documentation

## 2012-08-11 LAB — PROTIME-INR
INR: 3.67 — ABNORMAL HIGH (ref 0.00–1.49)
Prothrombin Time: 34.3 seconds — ABNORMAL HIGH (ref 11.6–15.2)

## 2012-08-11 LAB — COMPREHENSIVE METABOLIC PANEL
ALT: 30 U/L (ref 0–53)
AST: 27 U/L (ref 0–37)
Albumin: 4 g/dL (ref 3.5–5.2)
Alkaline Phosphatase: 44 U/L (ref 39–117)
BUN: 14 mg/dL (ref 6–23)
CO2: 26 mEq/L (ref 19–32)
Calcium: 9.5 mg/dL (ref 8.4–10.5)
Chloride: 105 mEq/L (ref 96–112)
Creatinine, Ser: 1.13 mg/dL (ref 0.50–1.35)
GFR calc Af Amer: 77 mL/min — ABNORMAL LOW (ref >90)
GFR calc non Af Amer: 66 mL/min — ABNORMAL LOW (ref >90)
Glucose, Bld: 111 mg/dL — ABNORMAL HIGH (ref 70–99)
Potassium: 3.6 mEq/L (ref 3.5–5.1)
Sodium: 143 mEq/L (ref 135–145)
Total Bilirubin: 0.6 mg/dL (ref 0.3–1.2)
Total Protein: 7.4 g/dL (ref 6.0–8.3)

## 2012-08-11 LAB — CBC WITH DIFFERENTIAL/PLATELET
Basophils Absolute: 0 10*3/uL (ref 0.0–0.1)
Basophils Relative: 1 % (ref 0–1)
Eosinophils Absolute: 0.3 10*3/uL (ref 0.0–0.7)
Eosinophils Relative: 4 % (ref 0–5)
HCT: 39.9 % (ref 39.0–52.0)
Hemoglobin: 14.1 g/dL (ref 13.0–17.0)
Lymphocytes Relative: 22 % (ref 12–46)
Lymphs Abs: 1.6 10*3/uL (ref 0.7–4.0)
MCH: 27.9 pg (ref 26.0–34.0)
MCHC: 35.3 g/dL (ref 30.0–36.0)
MCV: 79 fL (ref 78.0–100.0)
Monocytes Absolute: 0.5 10*3/uL (ref 0.1–1.0)
Monocytes Relative: 7 % (ref 3–12)
Neutro Abs: 5 10*3/uL (ref 1.7–7.7)
Neutrophils Relative %: 67 % (ref 43–77)
Platelets: 135 10*3/uL — ABNORMAL LOW (ref 150–400)
RBC: 5.05 MIL/uL (ref 4.22–5.81)
RDW: 15.6 % — ABNORMAL HIGH (ref 11.5–15.5)
WBC: 7.4 10*3/uL (ref 4.0–10.5)

## 2012-08-11 NOTE — ED Notes (Signed)
Pt sts he has been waking up with blood on his pillow.  Pt sts he was coughing up blood, but the pt sts he also has a loose crown that may be bleeding

## 2012-08-11 NOTE — ED Provider Notes (Signed)
History     CSN: [id number]  Arrival date & time 08/11/12  1041   First MD Initiated Contact with Patient 08/11/12 1231      Chief Complaint  Patient presents with  . Hemoptysis    (Consider location/radiation/quality/duration/timing/severity/associated sxs/prior treatment) HPIFloyd Nasim Ochoa. is a 66 y.o. male who's had persistent cough for over a year presents today with some blood on his pillow in the morning. Patient is taking Coumadin for a PE and his Coumadin level is being followed by his cardiologist Dr. Sharyn Ochoa. Patient does have baseline COPD is still able to golf once a week without difficulty or severe shortness of breath. She is concerned with the blood on the pillow that maybe bleeding was told to go to the emergency department while on Coumadin if he noticed any bleeding abnormally.  Patient says there is just one single occurrence, he's had a few occurrences in the past of mucus streaked with blood and one or 2 occurrences of that today. No frank hemoptysis today. He is uncertain of whether the blood on his pillow came from coughing up blood or from his crown on his right lower incisor that is been loose.  He has had a cough for a year, his cough has been persistent even prior to starting round probe.  Dr. Maple Ochoa, Pulm.   Dr Matthew Ochoa  INR  Past Medical History  Diagnosis Date  . Sleep apnea     NPSG 03-17-98, cpap 14  . Type 2 diabetes mellitus   . Hypertension   . COPD (chronic obstructive pulmonary disease)   . HTN (hypertension)   . Allergic rhinitis   . Abdominal pain, right upper quadrant   . Calculus of gallbladder without mention of cholecystitis or obstruction   . Benign localized hyperplasia of prostate with urinary obstruction and other lower urinary tract symptoms (LUTS)(600.21)   . Hematuria, unspecified   . Diabetes mellitus   . Hyperlipidemia     Past Surgical History  Procedure Date  . Angioplasty     stent  . Coronary angioplasty with stent  placement     Family History  Problem Relation Age of Onset  . Asthma Sister   . Lung cancer Father     History  Substance Use Topics  . Smoking status: Former Smoker -- 1.0 packs/day for 30 years    Types: Cigarettes    Quit date: 04/13/2011  . Smokeless tobacco: Former Neurosurgeon    Quit date: 04/13/2011  . Alcohol Use: No     Comment: occ      Review of Systems At least 10pt or greater review of systems completed and are negative except where specified in the HPI.  Allergies  Review of patient's allergies indicates no known allergies.  Home Medications   Current Outpatient Rx  Name  Route  Sig  Dispense  Refill  . AMIODARONE HCL 100 MG PO TABS   Oral   Take 1 tablet (100 mg total) by mouth every other day.   30 tablet   0   . ASPIRIN 81 MG PO TABS   Oral   Take 81 mg by mouth daily.           . CRESTOR 10 MG PO TABS   Oral   Take 10 mg by mouth every evening.          . FUROSEMIDE 40 MG PO TABS   Oral   Take 40 mg by mouth daily.         Marland Kitchen  LOSARTAN POTASSIUM 100 MG PO TABS   Oral   Take 1 tablet (100 mg total) by mouth daily.   30 tablet   0   . METFORMIN HCL 500 MG PO TABS   Oral   Take 500 tablets by mouth 2 (two) times daily with a meal.          . NITROGLYCERIN 0.4 MG SL SUBL   Sublingual   Place 0.4 mg under the tongue every 5 (five) minutes as needed. For chest pain         . RAMIPRIL 10 MG PO CAPS   Oral   Take 10 mg by mouth daily.         Marland Kitchen TAMSULOSIN HCL 0.4 MG PO CAPS   Oral   Take 0.4 mg by mouth daily.          . TRAMADOL HCL 50 MG PO TABS   Oral   Take 25 mg by mouth every 6 (six) hours as needed. For cough         . WARFARIN SODIUM 5 MG PO TABS   Oral   Take 7.5 mg by mouth See admin instructions. 6 days a week (none on Sundays)           BP 144/110  Pulse 55  Temp 98 F (36.7 C) (Oral)  Resp 18  SpO2 94%  Physical Exam  Nursing notes reviewed.  Electronic medical record reviewed. VITAL SIGNS:    Filed Vitals:   08/11/12 1049 08/11/12 1409  BP: 144/110 135/74  Pulse: 55 89  Temp: 98 F (36.7 C)   TempSrc: Oral   Resp: 18 18  SpO2: 94% 100%   CONSTITUTIONAL: Awake, oriented, appears non-toxic HENT: Atraumatic, normocephalic, oral mucosa pink and moist, airway patent. Loose crown patient's incisors #26 specifically. Nares patent without drainage. External ears normal. EYES: Conjunctiva clear, EOMI, PERRLA NECK: Trachea midline, non-tender, supple CARDIOVASCULAR: Normal heart rate, Normal rhythm, No murmurs, rubs, gallops PULMONARY/CHEST: Clear to auscultation, no rhonchi, wheezes, or rales. Symmetrical breath sounds. Non-tender. ABDOMINAL: Non-distended, soft, non-tender - no rebound or guarding.  BS normal. NEUROLOGIC: Non-focal, moving all four extremities, no gross sensory or motor deficits. EXTREMITIES: No clubbing, cyanosis, or edema SKIN: Warm, Dry, No erythema, No rash  ED Course  Procedures (including critical care time)  Labs Reviewed  CBC WITH DIFFERENTIAL - Abnormal; Notable for the following:    RDW 15.6 (*)     Platelets 135 (*)     All other components within normal limits  PROTIME-INR - Abnormal; Notable for the following:    Prothrombin Time 34.3 (*)     INR 3.67 (*)     All other components within normal limits  COMPREHENSIVE METABOLIC PANEL - Abnormal; Notable for the following:    Glucose, Bld 111 (*)     GFR calc non Af Amer 66 (*)     GFR calc Af Amer 77 (*)     All other components within normal limits   Dg Chest 2 View  08/11/2012  *RADIOLOGY REPORT*  Clinical Data: Cough and hemoptysis.  CHEST - 2 VIEW  Comparison: 07/01/2012  Findings: Cardiomegaly noted with low lung volumes.  No pleural effusion or overt edema noted.  No airspace opacity is identified.  IMPRESSION:  1.  Mild cardiomegaly. 2.  A cause for cough and hemoptysis is not identified on today's chest radiographs.   Original Report Authenticated By: Matthew Ochoa, M.D.      1.  Cough with  hemoptysis   2. Chronic cough   3. COPD (chronic obstructive pulmonary disease)   4. Supratherapeutic INR       MDM  Matthew Ochoa. is a 66 y.o. male presenting with concerns for hemoptysis. Patient is had no other instances of hemoptysis today he just woke up with some blood on the pillow. There is a little bit of oozing from the blood in the gum where his crown is loose. Patient's INR is slightly supratherapeutic at 3.67.  Patient's x-ray is completely clear with no indication of pulmonary infarction-she's also had no other symptoms since this morning I doubt patient has any active hemoptysis or bleeding. Patient was able to play full rounds of golf on Tuesday and Thursday without any issues.  She has no chest pain, no shortness of breath. Besides his INR, his labs are unremarkable.  Discussed with Dr. Algie Coffer covering for Dr. Sharyn Ochoa, we'll hold the patient's Coumadin tonight, Sunday night and Wednesday night. Patient has a followup on Monday to get his Coumadin level checked as well. They can make further adjustments at that time.  I explained the diagnosis and have given explicit precautions to return to the ER including worsening bleeding, rest pain or shortness of breath or any other new or worsening symptoms. The patient understands and accepts the medical plan as it's been dictated and I have answered their questions. Discharge instructions concerning home care and prescriptions have been given.  The patient is STABLE and is discharged to home in good condition.            Jones Skene, MD 08/11/12 1727

## 2012-08-22 ENCOUNTER — Other Ambulatory Visit: Payer: Self-pay | Admitting: Internal Medicine

## 2012-08-22 NOTE — Telephone Encounter (Signed)
Please advise if okay to refill. Thanks.  

## 2012-08-23 ENCOUNTER — Telehealth: Payer: Self-pay | Admitting: Internal Medicine

## 2012-08-23 MED ORDER — TRAMADOL HCL 50 MG PO TABS: 25.0000 mg | ORAL_TABLET | Freq: Four times a day (QID) | ORAL | Status: DC | PRN

## 2012-08-23 NOTE — Telephone Encounter (Signed)
rx sent

## 2012-08-23 NOTE — Telephone Encounter (Signed)
Last OV was 07/21/2012. This medication was discontinued at that visit. Would you be okay with giving this medication back to the pt? Please advise. Thanks!

## 2012-08-23 NOTE — Telephone Encounter (Signed)
Ok to refill tramadol for cough

## 2012-08-23 NOTE — Telephone Encounter (Signed)
Done 08/23/12

## 2012-08-28 ENCOUNTER — Encounter: Payer: Self-pay | Admitting: Internal Medicine

## 2012-08-31 ENCOUNTER — Telehealth: Payer: Self-pay | Admitting: Internal Medicine

## 2012-08-31 NOTE — Telephone Encounter (Signed)
Pt aware that his download shows he needs to continue using CPAP but aim for all night every night.

## 2012-09-08 ENCOUNTER — Encounter (INDEPENDENT_AMBULATORY_CARE_PROVIDER_SITE_OTHER): Payer: Self-pay

## 2012-09-25 ENCOUNTER — Encounter: Payer: Self-pay | Admitting: Gastroenterology

## 2012-10-09 ENCOUNTER — Other Ambulatory Visit: Payer: Self-pay | Admitting: Internal Medicine

## 2012-10-09 NOTE — Telephone Encounter (Signed)
Please advise if okay to refill. Thanks.  

## 2012-10-10 NOTE — Telephone Encounter (Signed)
Ok to refill 

## 2012-10-11 ENCOUNTER — Other Ambulatory Visit: Payer: Self-pay | Admitting: Internal Medicine

## 2012-10-11 ENCOUNTER — Encounter: Payer: Self-pay | Admitting: Physician Assistant

## 2012-10-17 ENCOUNTER — Ambulatory Visit (INDEPENDENT_AMBULATORY_CARE_PROVIDER_SITE_OTHER): Payer: Medicare Other | Admitting: Physician Assistant

## 2012-10-17 ENCOUNTER — Encounter: Payer: Self-pay | Admitting: Physician Assistant

## 2012-10-17 VITALS — BP 130/80 | HR 66 | Ht 71.0 in | Wt 276.2 lb

## 2012-10-17 DIAGNOSIS — Z1211 Encounter for screening for malignant neoplasm of colon: Secondary | ICD-10-CM

## 2012-10-17 DIAGNOSIS — Z7901 Long term (current) use of anticoagulants: Secondary | ICD-10-CM

## 2012-10-17 DIAGNOSIS — Z86010 Personal history of colonic polyps: Secondary | ICD-10-CM

## 2012-10-17 NOTE — Progress Notes (Signed)
Subjective:    Patient ID: Matthew Hane., male    DOB: 07/19/1946, 67 y.o.   MRN: [id number]  HPI Matthew Ochoa is a very nice 67 year old African American male known to Dr. Arlyce Dice from prior colonoscopies. He last had colonoscopy in February of 2011 and at that time had 3 polyps removed one of which was a 1 cm polyp in the transverse colon. Path on this was consistent with a tubular adenoma, one of the other polyps was hyperplastic. He was recommended for 3 year followup. Patient also has multiple other medical issues. Unfortunately he was diagnosed with a pulmonary embolus in October of 2013 and is now on Coumadin. He also has history of sleep apnea, diabetes mellitus, COPD, hypertension, and coronary artery disease as well as atrial fibrillation. He has no current GI complaints, specifically denies any abdominal pain changes , in his bowel habits, melena or hematochezia. He has been having a lot of problems with lower back pain.    Review of Systems  Constitutional: Negative.   HENT: Negative.   Eyes: Negative.   Respiratory: Negative.   Cardiovascular: Negative.   Gastrointestinal: Negative.   Endocrine: Negative.   Musculoskeletal: Positive for back pain.  Allergic/Immunologic: Negative.   Neurological: Negative.   Hematological: Bruises/bleeds easily.  Psychiatric/Behavioral: Negative.    Outpatient Prescriptions Prior to Visit  Medication Sig Dispense Refill  . aspirin 81 MG tablet Take 81 mg by mouth daily.        . CRESTOR 10 MG tablet Take 10 mg by mouth every evening.       . furosemide (LASIX) 40 MG tablet Take 40 mg by mouth daily.      Marland Kitchen losartan (COZAAR) 100 MG tablet Take 1 tablet (100 mg total) by mouth daily.  30 tablet  0  . metFORMIN (GLUCOPHAGE) 500 MG tablet Take 500 tablets by mouth 2 (two) times daily with a meal.       . nitroGLYCERIN (NITROSTAT) 0.4 MG SL tablet Place 0.4 mg under the tongue every 5 (five) minutes as needed. For chest pain      . ramipril  (ALTACE) 10 MG capsule Take 10 mg by mouth 2 (two) times daily.       . Tamsulosin HCl (FLOMAX) 0.4 MG CAPS Take 0.4 mg by mouth daily.       Marland Kitchen warfarin (COUMADIN) 5 MG tablet Take 7.5 mg by mouth See admin instructions. 5 days a week (none on Sundays  And  Weds.)      . amiodarone (PACERONE) 100 MG tablet Take 1 tablet (100 mg total) by mouth every other day.  30 tablet  0  . traMADol (ULTRAM) 50 MG tablet TAKE 0.5 TABLETS (25 MG TOTAL) BY MOUTH EVERY 6 (SIX) HOURS AS NEEDED. FOR COUGH  30 tablet  0  . amoxicillin-clavulanate (AUGMENTIN) 875-125 MG per tablet Take 1 tablet by mouth 2 (two) times daily. 10 day course of therapy; started on 08-07-12       No facility-administered medications prior to visit.   No Known Allergies Patient Active Problem List  Diagnosis  . DIABETES, TYPE 2  . OBSTRUCTIVE SLEEP APNEA  . HYPERTENSION  . ALLERGIC RHINITIS  . Dyspnea on exertion  . COPD (chronic obstructive pulmonary disease)  . Syncope  . Abdominal pain, RUQ (right upper quadrant)  . Cholelithiasis  . Subacute bronchitis  . Pulmonary embolism  . Atrial fibrillation  . Edema leg  . Hyperlipidemia  . CAD (coronary artery disease)   History  Substance Use Topics  . Smoking status: Former Smoker -- 1.00 packs/day for 30 years    Types: Cigarettes    Quit date: 04/13/2011  . Smokeless tobacco: Never Used  . Alcohol Use: No     Comment: occ   family history includes Asthma in his sister; Breast cancer in his sister; and Lung cancer in his father.     Objective:   Physical Exam discussion only today, no exam. Well-developed older African American male in no acute distress, pleasant blood pressure 130/80 pulse 66 height 5 foot 11 weight 276.        Assessment & Plan:  #45  67 year old male history of adenomatous colon polyps due for 3 year interval followup. #2 fairly recent pulmonary embolus, on Coumadin #3 coronary artery disease #4 COPD #5  atrial fibrillation #6 adult onset  diabetes mellitus #7 sleep apnea  Plan; Patient will need followup colonoscopy, however this can be delayed a year  if necessary and given recent pulmonary embolus believe  risk of interrupting Coumadin is prohibitively high and that we should not consider followup colonoscopy until he has been at least a year out from his PE. We discussed this today and will send him a reminder for followup office visit with Dr. Arlyce Dice in October or November of 2014 .

## 2012-10-17 NOTE — Progress Notes (Signed)
Reviewed and agree with management. Jesaiah Fabiano D. Shenia Alan, M.D., FACG  

## 2012-10-17 NOTE — Patient Instructions (Addendum)
We will send you an appointment reminder letter for October  with Amy Esterwood PA-C to discuss colonoscopy.

## 2012-10-23 ENCOUNTER — Ambulatory Visit: Payer: Medicare Other | Admitting: Internal Medicine

## 2012-11-30 ENCOUNTER — Ambulatory Visit: Payer: Medicare Other | Admitting: Internal Medicine

## 2012-12-04 ENCOUNTER — Encounter: Payer: Self-pay | Admitting: General Practice

## 2012-12-10 ENCOUNTER — Other Ambulatory Visit: Payer: Self-pay | Admitting: Internal Medicine

## 2012-12-25 ENCOUNTER — Encounter: Payer: Self-pay | Admitting: Internal Medicine

## 2012-12-25 ENCOUNTER — Ambulatory Visit (INDEPENDENT_AMBULATORY_CARE_PROVIDER_SITE_OTHER): Payer: Medicare Other | Admitting: Internal Medicine

## 2012-12-25 VITALS — BP 122/80 | HR 59 | Ht 71.0 in | Wt 280.2 lb

## 2012-12-25 DIAGNOSIS — G4733 Obstructive sleep apnea (adult) (pediatric): Secondary | ICD-10-CM

## 2012-12-25 DIAGNOSIS — J439 Emphysema, unspecified: Secondary | ICD-10-CM

## 2012-12-25 DIAGNOSIS — J438 Other emphysema: Secondary | ICD-10-CM

## 2012-12-25 MED ORDER — TRAMADOL HCL 50 MG PO TABS: 25.0000 mg | ORAL_TABLET | Freq: Four times a day (QID) | ORAL | Status: DC | PRN

## 2012-12-25 MED ORDER — OLMESARTAN MEDOXOMIL 20 MG PO TABS: 20.0000 mg | ORAL_TABLET | Freq: Every day | ORAL | Status: DC

## 2012-12-25 NOTE — Patient Instructions (Addendum)
Our goal with CPAP is to use it at least 4 hours per night and ideally -all night, every night- to do the best at protecting your heart from the sleep apnea.  Your ramipril may be the cause, or one cause, for your chronic cough. To see, try the script for Benicar for one month instead of ramipril. When the Benicar is used up, then go back to your ramipril and see what happens with your cough.

## 2012-12-25 NOTE — Progress Notes (Signed)
07/30/11- 55 yoM former smoker previously followed 2009 for OSA, and coming now at the request of his cardiologist, Dr. Sharyn Lull, to evaluate for dyspnea. PCP Dr [name]. He has been aware of shortness of breath for about 3 months without definite onset. He was noted to have irregular heartbeat and ultimately had catheterization and cardiac stent August 7, without infarction. He is now in cardiac rehabilitation. He notices dyspnea on exertion while walking. [place] for complaint of dyspnea and treated there with inhalers and prednisone. He feels stable now except that he remains more easily short of breath with brisk walking. He is comfortable sitting and supine. Has had flu shot. Denies cough, wheeze, chest pain, palpitation or swelling. He had smoked one pack per day for 30 years, quitting in June of 2012. Office PFT 11/05/2002 had recorded FVC 3500 (74%) FEV1 2800 (75%) FEV1/FVC 0.80 with no response to bronchodilator. Medical treatment has been for high blood pressure, atrial fibrillation, diabetes and obstructive sleep apnea. He denies history of heart failure or pulmonary embolism. OSA- NPSG 03/17/98- AHI 32/hr; weight was 270 lbs. he has worn CPAP with variable long-term compliance and control. He says sometimes the mask is "bothersome" because of postnasal drainage and he is evasive about whether he has been using it recently. Pressure had been set at 14.  09/03/11-  65 yoM former smoker with dyspnea, hx OSA/CPAP, rhinitis . PCP Dr [name]. He remains off of cigarettes after quitting in June of 2012. Had flu shot. Was coughing at night but Robitussin helped. At times he coughed until he retched. Denies history of GERD. Last chest x-ray was at [facility] within the last several months and he was told it was "good". He uses oxygen off and on, now but cough is improved. PFT-08/09/2011-mild obstructive airways disease with insignificant response to  bronchodilator, mild restriction, mild reduction of DLCO FEV1/FVC 0.65, DLCO 69%. TLC 71%. 6 minute walk test 08/09/2011-96% on room air to start, falling to 88% on room air by the end of his walk. Blood pressure rose to 170/110, pulse 112. After 2 minutes recovery oxygen saturation on room air at rest was 94%. He walk 420 m. We discussed significant hypertension and significant oxygen desaturation with exertion.   12/07/2011 Acute OV  Complains of continuous cough with white mucus, increased SOB x2+ months - worse x 1-2 weeks, almost makes him vomit.  Patient complains that he has a persistent cough over the last 2-3 months. He has been seen by his cardiologist recently and given antibiotic without much relief. Has taken several doses of Robitussin-DM and Mucinex without much relief. He was also, cough, and Tessalon, without any relief. Patient reports that he is no longer taking Spiriva because it did not help and his cough continued despite stopping Spiriva. He denies any hemoptysis, chest pain, orthopnea, PND, or leg swelling.  01/07/12-65 yoM former smoker with dyspnea, hx OSA/CPAP, rhinitis . PCP Dr [name]. Complains of throat tickle with cough for 3 months. No definite onset. White phlegm. No wheeze. He was given a sinus medication, Mucinex, prednisone and benzonatate. These helped for 2 weeks. Always chewing a toothpick but denies aspiration.  02/22/12- 8 yoM former smoker with dyspnea, hx OSA, rhinitis, complicated by DM, AFib/ amiodarone . PCP Dr [name]. Does not wear CPAP 14/ [employer]-"makes cough worse" I discussed his ACE inhibitor Ramipril. He had already discussed this with his cardiologist but he insists he was coughing about the same long  before this drug was started. Benzonatate works well. CXR 12/09/11-reviewed Findings: The heart size and pulmonary vascularity are normal.  Lungs are clear. Calcified lymph nodes in the aortopulmonary  window, unchanged. No acute osseous abnormality.   IMPRESSION:  No acute abnormalities.  Original Report Authenticated By: Gwynn Burly, M.D.   06/06/12- 49 yoM former smoker with dyspnea, hx OSA, rhinitis, complicated by DM, AFib/ amiodarone .  PCP Dr [name]. States he is wearing CPAP 14/ [employer] every night for approximately 3-4 hours; feels like nose feels funny at times from the pressure. Plan reduce CPAP to 12. Cough tends to come back, and usually not productive. We discussed workup. CT scan causes claustrophobia. Will get flu shot at work place. CXR 05/31/12- reviewed with him. IMPRESSION:  Question developing air space disease at the left lung base.  Original Report Authenticated By: Reyes Ivan, M.D.   07/21/12- 40 yoM former smoker with dyspnea, hx OSA, rhinitis, complicated by DM, AFib/ amiodarone .  PCP Dr [name]. FOLLOWS FOR: Wears CPAP some nights for approximately 3 hours. Did better with CPAP while in hosptial. Hospitalized October 26 through November 3 with left leg DVT and pulmonary embolism despite being already on Effient for AFib. We reviewed his x-rays noting subsequent clearing of the September 25 airspace disease. Now on Coumadin. Cough is improved. We discussed potential aggravating role of ramipril as an ACE inhibitor. Tramadol did help  cough but Dr. Sharyn Lull asked him not to use it. CPAP 12/ [facility] is well tolerated until he gets tired of it around 3 AM. Stuffy nose wakes him.  12/25/12- 43 yoM former smoker with dyspnea, chronic cough, hx OSA, rhinitis, complicated by DM, AFib/ amiodarone / +PE FOLLOWS FOR: Wears CPAP12/[employer] most every night; was told not using enough via autotitration done.. Auto titration report for one week in November did not indicate he was using it for long enough each night and this was discussed. Recommended pressure at that time was 14 but he remains on 12. He remains on ramipril and we again discussed ACE inhibitor induced cough. CT chest 07/01/12 IMPRESSION:  Positive study for  bilateral central and segmental pulmonary  emboli.  Results were discussed by telephone with Dr. Freida Busman at 2337 hours on  07/01/2012.  Original Report Authenticated By: Marlon Pel, M.D. CXR 12/ 6 /13 IMPRESSION:  1. Mild cardiomegaly.  2. A cause for cough and hemoptysis is not identified on today's  chest radiographs.  Original Report Authenticated By: Gaylyn Rong, M.D. PFT 08/09/2011-mild COPD/emphysema. FVC 3.84/82%, FEV12.49/77%, FEV1/FVC 0.65. Insignificant response to bronchodilator. TLC 71% , DLCO 69%.   ROS-see HPI Constitutional:   No-   weight loss, night sweats, fevers, chills, fatigue, lassitude. HEENT:   No-  headaches, difficulty swallowing, tooth/dental problems, sore throat,       No-  sneezing, itching, ear ache, +nasal congestion, post nasal drip,  CV:  No-   chest pain, orthopnea, PND, swelling in lower extremities, anasarca, dizziness, palpitations Resp: No-   shortness of breath with exertion or at rest.              No-  productive cough,  + non-productive cough,  No- coughing up of blood.              No-   change in color of mucus.  No- wheezing.   Skin: No-   rash or lesions. GI:  No-   heartburn, indigestion, abdominal pain, nausea, vomiting,  GU: . MS:  No-  joint pain or swelling.  . Neuro-     nothing unusual Psych:  No- change in mood or affect. No depression or anxiety.  No memory loss.  OBJ- Physical Exam General- Alert, Oriented, Affect-appropriate, Distress- none acute, overweight,  Always a toothpick Skin- rash-none, lesions- none, excoriation- none Lymphadenopathy- none Head- atraumatic            Eyes- Gross vision intact, PERRLA, conjunctivae and secretions clear            Ears- Hearing, canals-normal            Nose- + mild turbinate edema, no-Septal dev, mucus, polyps, erosion, perforation             Throat- Mallampati II , mucosa clear , drainage- none, tonsils- atrophic, chewing toothpick Neck- flexible , trachea midline,  no stridor , thyroid nl, carotid no bruit Chest - symmetrical excursion , unlabored           Heart/CV- RRR , no murmur , no gallop  , no rub, nl s1 s2                           - JVD- none , edema- none, stasis changes- none, varices- none           Lung- diminished but clear to P&A, wheeze- none, cough- none , dullness-none, rub- none           Chest wall-  Abd-  Br/ Gen/ Rectal- Not done, not indicated Extrem- cyanosis- none, clubbing, none, atrophy- none, strength- nl Neuro- grossly intact to observation

## 2012-12-31 NOTE — Assessment & Plan Note (Signed)
He was originally on a CPAP pressure of 14 as indicated from his download in November, but he didn't like the pressure. We turned it down to 12. I again reviewed the importance of compliance, at least 4 hours a night. If he finds he can't do this then we will have to discuss alternatives. I reemphasized the impact of untreated obstructive sleep apnea on his heart disease.

## 2012-12-31 NOTE — Assessment & Plan Note (Addendum)
Mild obstructive airways disease may contribute to his chronic cough and add to dyspnea along with his heart disease. This visit I press the issue of seeing of his chronic cough complaint is related to his ACE inhibitor. Plan-try samples of Benicar 40 mg, one half daily, instead of ramipril. Watch for change in dry cough. When out of the samples, return to ramipril.

## 2013-01-24 ENCOUNTER — Encounter: Payer: Self-pay | Admitting: Internal Medicine

## 2013-01-24 ENCOUNTER — Ambulatory Visit (INDEPENDENT_AMBULATORY_CARE_PROVIDER_SITE_OTHER): Payer: Medicare Other | Admitting: Internal Medicine

## 2013-01-24 ENCOUNTER — Ambulatory Visit
Admission: RE | Admit: 2013-01-24 | Discharge: 2013-01-24 | Disposition: A | Discharge status: Home / Self Care | Payer: Medicare Other | Source: Ambulatory Visit | Attending: Internal Medicine | Admitting: Internal Medicine

## 2013-01-24 VITALS — BP 172/88 | HR 44 | Temp 96.0°F | Wt 277.0 lb

## 2013-01-24 DIAGNOSIS — M25511 Pain in right shoulder: Secondary | ICD-10-CM

## 2013-01-24 DIAGNOSIS — I10 Essential (primary) hypertension: Secondary | ICD-10-CM

## 2013-01-24 DIAGNOSIS — E119 Type 2 diabetes mellitus without complications: Secondary | ICD-10-CM

## 2013-01-24 DIAGNOSIS — G4733 Obstructive sleep apnea (adult) (pediatric): Secondary | ICD-10-CM

## 2013-01-24 DIAGNOSIS — M25519 Pain in unspecified shoulder: Secondary | ICD-10-CM | POA: Insufficient documentation

## 2013-01-24 DIAGNOSIS — I2699 Other pulmonary embolism without acute cor pulmonale: Secondary | ICD-10-CM

## 2013-01-24 DIAGNOSIS — E785 Hyperlipidemia, unspecified: Secondary | ICD-10-CM

## 2013-01-24 DIAGNOSIS — M2241 Chondromalacia patellae, right knee: Secondary | ICD-10-CM

## 2013-01-24 DIAGNOSIS — I25810 Atherosclerosis of coronary artery bypass graft(s) without angina pectoris: Secondary | ICD-10-CM | POA: Insufficient documentation

## 2013-01-24 DIAGNOSIS — M2240 Chondromalacia patellae, unspecified knee: Secondary | ICD-10-CM | POA: Insufficient documentation

## 2013-01-24 DIAGNOSIS — I4891 Unspecified atrial fibrillation: Secondary | ICD-10-CM

## 2013-01-24 DIAGNOSIS — I5032 Chronic diastolic (congestive) heart failure: Secondary | ICD-10-CM | POA: Insufficient documentation

## 2013-01-24 NOTE — Progress Notes (Signed)
Subjective:    Patient ID: Matthew Ochoa., male    DOB: Jan 29, 1946, 67 y.o.   MRN: [id number] Please note that the patient's record from the previous physician is incomplete and does not give an accurate picture of the patient's medical condition.   Knee Pain   : Patient is here today to establish care and has 2 acute complaints. He's also here for followup of his chronic medical problems. The patient complains of pain in the right knee with ambulation. He states that approximately 3 weeks ago he was engaged in his normal exercise of walking and golfing when he noticed that he had some pain in the right knee. He states that he continue to exercise on the right knee however 2 weeks after the original onset of pain the pain escalated further he was seen in urgent care. Urgent care he was given medications including hydrocodone and had an ultrasound of the leg performed to ensure no blood clot. Although the patient is able to cannulate with a cane he states that he has intermittent pain at the medial surface of the knee just adjacent to the patella.  The patient also complains of decreased range of motion of the right shoulder which is comforted by pain. The pain is sharp and occurs the patient is going through the range of motion. Is nonradiating and not associated by any other symptoms except for decreased range of motion.  With regard to this quadrant medical problems the patient admits to noncompliance with her diabetic diet and states that his blood sugars have been elevated up to 200 at times. He has not checked his blood pressures at home and has not had a full examination in greater than 2 years. The patient is on anti-lipidemic medication however does not recall when he last had his lipids evaluated. Evaluation of the patient's record shows no record of lipid evaluation in several years.    Review of Systems  Constitutional: Negative.   HENT: Negative.   Eyes: Negative.   Respiratory:  Negative.   Cardiovascular: Negative.   Gastrointestinal: Negative.   Endocrine: Negative.   Genitourinary: Negative.   Musculoskeletal: Positive for joint swelling (right knee).  Allergic/Immunologic: Negative.   Neurological: Negative.   Hematological: Negative.   Psychiatric/Behavioral: Negative.        Objective:   Physical Exam  Constitutional: He is oriented to person, place, and time. He appears well-developed and well-nourished.  Obese  HENT:  Head: Normocephalic and atraumatic.  Eyes: Conjunctivae and EOM are normal. Pupils are equal, round, and reactive to light.  Neck: Normal range of motion. Neck supple.  Cardiovascular: Normal rate and normal heart sounds.   Pulmonary/Chest: Effort normal and breath sounds normal. No respiratory distress. He has no wheezes.  Abdominal: Soft. Bowel sounds are normal.  Musculoskeletal: Normal range of motion. He exhibits tenderness (Positive patellar grinding test on the right).  Neurological: He is alert and oriented to person, place, and time. He has normal reflexes.  Psychiatric: He has a normal mood and affect. His behavior is normal. Judgment normal.          Assessment & Plan:  1. Right knee pain: The patient appears to have some degree of chondromalacia patella based on physical examination. He also has some off tracking of the patella. I advised the patient to perform strengthening exercises to the right knee and also to obtain a knee brace with an aperture for stabilization of the patella.  2. Diabetes type 2: The patient  admits that is not adhering to a diabetic diet and his blood sugars have been anywhere from 1 1500. He has not been checking his blood sugars on regular basis. We'll check hemoglobin A1c and make further recommendations regarding diabetes management. The patient has declined further examination today and we will do that on his next visit in one month. His last eye examination was 10/06/2012 performed by Dr.  Delrae Sawyers where she identified hypertensive retinopathy as well as a suspicion of glaucoma.  3. Hypertension: Blood pressure is not at goal at this time. Patient is on multiple medications for his blood pressure and has had no labs to evaluate renal function or electrolyte levels and more than 3 months. I will obtain labs today and then address the hypertension and adjustments to medication and to move further towards goal.  4. Chronic diastolic heart failure: Echocardiogram from 2013 shows grade 1 diastolic dysfunction. The patient is on Lasix on a daily basis. He denies any pedal edema, PND, orthopnea shortness of breath or dyspnea on exertion. We'll continue current dose of Lasix without any changes  5. History of pulmonary embolus: Patient has a history of pulmonary embolus and is on Coumadin which is being managed by Dr. Sharyn Lull. INR done 01/16/2013 was 2.36 which is within therapeutic range.  6. Obstructive sleep apnea: The patient states that he is compliant with his CPAP about three fourths of the time. He identifies the irritation of the mask was the reason for not using his CPAP. The patient will work and be more compliant with the use of his CPAP. I explained the complications of untreated obstructive sleep apnea and the patient verbalized understanding.  7. Right shoulder pain and decreased range of motion: The patient has some limitation in range of motion and external and internal rotation. He does have pain at the extremes of the joint in abduction and shoulder flexion however he does not have a capsular pattern of restriction. I will obtain a x-ray of the right shoulder and I've given the patient some exercises to improve his range of motion. If the decreased range of motion and pain persists then the patient may benefit from physical therapy.  Labs: CBC with differential, basic metabolic panel, magnesium, hemoglobin A1c, fasting lipid panel Diagnostic studies: X-ray of the right  shoulder Followup: Return to clinic one month for 30 minute visit with M.D.  Total time spent 47 minutes

## 2013-01-25 ENCOUNTER — Other Ambulatory Visit: Payer: Medicare Other | Admitting: *Deleted

## 2013-01-25 DIAGNOSIS — E119 Type 2 diabetes mellitus without complications: Secondary | ICD-10-CM

## 2013-01-25 DIAGNOSIS — I10 Essential (primary) hypertension: Secondary | ICD-10-CM

## 2013-01-25 DIAGNOSIS — E785 Hyperlipidemia, unspecified: Secondary | ICD-10-CM

## 2013-01-25 DIAGNOSIS — I25810 Atherosclerosis of coronary artery bypass graft(s) without angina pectoris: Secondary | ICD-10-CM

## 2013-01-25 LAB — CBC WITH DIFFERENTIAL/PLATELET
Basophils Absolute: 0 10*3/uL (ref 0.0–0.1)
Basophils Relative: 1 % (ref 0–1)
Eosinophils Absolute: 0.3 10*3/uL (ref 0.0–0.7)
Eosinophils Relative: 5 % (ref 0–5)
HCT: 40.4 % (ref 39.0–52.0)
Hemoglobin: 14 g/dL (ref 13.0–17.0)
Lymphocytes Relative: 29 % (ref 12–46)
Lymphs Abs: 2 10*3/uL (ref 0.7–4.0)
MCH: 28.5 pg (ref 26.0–34.0)
MCHC: 34.7 g/dL (ref 30.0–36.0)
MCV: 82.3 fL (ref 78.0–100.0)
Monocytes Absolute: 0.4 10*3/uL (ref 0.1–1.0)
Monocytes Relative: 6 % (ref 3–12)
Neutro Abs: 4.3 10*3/uL (ref 1.7–7.7)
Neutrophils Relative %: 59 % (ref 43–77)
Platelets: 140 10*3/uL — ABNORMAL LOW (ref 150–400)
RBC: 4.91 MIL/uL (ref 4.22–5.81)
RDW: 17.3 % — ABNORMAL HIGH (ref 11.5–15.5)
WBC: 7.1 10*3/uL (ref 4.0–10.5)

## 2013-01-25 LAB — MAGNESIUM: Magnesium: 1.9 mg/dL (ref 1.5–2.5)

## 2013-01-25 LAB — LIPID PANEL
Cholesterol: 89 mg/dL (ref 0–200)
HDL: 26 mg/dL — ABNORMAL LOW (ref >39)
LDL Cholesterol: 47 mg/dL (ref 0–99)
Total CHOL/HDL Ratio: 3.4 Ratio
Triglycerides: 79 mg/dL (ref <150)
VLDL: 16 mg/dL (ref 0–40)

## 2013-01-25 LAB — BASIC METABOLIC PANEL
BUN: 15 mg/dL (ref 6–23)
CO2: 26 mEq/L (ref 19–32)
Calcium: 9.2 mg/dL (ref 8.4–10.5)
Chloride: 106 mEq/L (ref 96–112)
Creat: 1.06 mg/dL (ref 0.50–1.35)
Glucose, Bld: 125 mg/dL — ABNORMAL HIGH (ref 70–99)
Potassium: 4.1 mEq/L (ref 3.5–5.3)
Sodium: 141 mEq/L (ref 135–145)

## 2013-01-25 LAB — HEMOGLOBIN A1C
Hgb A1c MFr Bld: 6 % — ABNORMAL HIGH (ref <5.7)
Mean Plasma Glucose: 126 mg/dL — ABNORMAL HIGH (ref <117)

## 2013-01-31 ENCOUNTER — Encounter: Payer: Self-pay | Admitting: *Deleted

## 2013-01-31 ENCOUNTER — Telehealth: Payer: Self-pay | Admitting: *Deleted

## 2013-01-31 DIAGNOSIS — M1990 Unspecified osteoarthritis, unspecified site: Secondary | ICD-10-CM

## 2013-01-31 NOTE — Telephone Encounter (Signed)
Pt request PT w/ [facility].  Order faxed to [facility] @ [phone].

## 2013-01-31 NOTE — Telephone Encounter (Signed)
Message copied by Alcide Goodness on Wed Jan 31, 2013  9:28 AM ------      Message from: Marthann Schiller A      Created: Tue Jan 30, 2013  1:38 PM       Find put from Mr. Govan how his shoulder is feeling and if no improvement refer to PT with diagnosis of Arthritis. ------

## 2013-01-31 NOTE — Progress Notes (Unsigned)
Betsy w/ [facility] called and said they cannot accept pt for PT at this time, due to training they are not currently not accepting BC/BS.  She gave me a fax number for [facility] [phone]).  I have refaxed the order to there company instead.

## 2013-01-31 NOTE — Progress Notes (Deleted)
  Subjective:    Patient ID: Matthew Hane., male    DOB: Feb 06, 1946, 67 y.o.   MRN: [id number]  HPI    Review of Systems     Objective:   Physical Exam        Assessment & Plan:

## 2013-02-14 ENCOUNTER — Telehealth: Payer: Self-pay | Admitting: Internal Medicine

## 2013-02-14 NOTE — Telephone Encounter (Signed)
Per Dr. Ashley Royalty she would like pt to have his PT done with [facility] outpatient PT ([address] P# [phone]).  Pts dx. Is arthritis.  I have left a message with facility and awaiting return call w/ appt date and time.

## 2013-02-16 ENCOUNTER — Other Ambulatory Visit: Payer: Medicare Other

## 2013-02-20 ENCOUNTER — Encounter: Payer: Self-pay | Admitting: Internal Medicine

## 2013-02-20 ENCOUNTER — Ambulatory Visit (INDEPENDENT_AMBULATORY_CARE_PROVIDER_SITE_OTHER): Payer: Medicare Other | Admitting: Internal Medicine

## 2013-02-20 VITALS — BP 155/100 | HR 56 | Temp 98.4°F | Resp 16 | Ht 71.0 in | Wt 274.0 lb

## 2013-02-20 DIAGNOSIS — I10 Essential (primary) hypertension: Secondary | ICD-10-CM

## 2013-02-20 DIAGNOSIS — S86919A Strain of unspecified muscle(s) and tendon(s) at lower leg level, unspecified leg, initial encounter: Secondary | ICD-10-CM | POA: Insufficient documentation

## 2013-02-20 DIAGNOSIS — M7501 Adhesive capsulitis of right shoulder: Secondary | ICD-10-CM

## 2013-02-20 DIAGNOSIS — M7650 Patellar tendinitis, unspecified knee: Secondary | ICD-10-CM

## 2013-02-20 DIAGNOSIS — M7500 Adhesive capsulitis of unspecified shoulder: Secondary | ICD-10-CM

## 2013-02-20 DIAGNOSIS — IMO0002 Reserved for concepts with insufficient information to code with codable children: Secondary | ICD-10-CM

## 2013-02-20 DIAGNOSIS — M7651 Patellar tendinitis, right knee: Secondary | ICD-10-CM

## 2013-02-20 DIAGNOSIS — M19019 Primary osteoarthritis, unspecified shoulder: Secondary | ICD-10-CM

## 2013-02-20 DIAGNOSIS — S86911A Strain of unspecified muscle(s) and tendon(s) at lower leg level, right leg, initial encounter: Secondary | ICD-10-CM

## 2013-02-20 MED ORDER — HYDROCHLOROTHIAZIDE 25 MG PO TABS
25.0000 mg | ORAL_TABLET | Freq: Every day | ORAL | Status: DC
Start: 2013-02-20 — End: 2013-07-18

## 2013-02-20 MED ORDER — POTASSIUM CITRATE ER 10 MEQ (1080 MG) PO TBCR: 20.0000 meq | EXTENDED_RELEASE_TABLET | Freq: Three times a day (TID) | ORAL | Status: DC

## 2013-02-20 NOTE — Progress Notes (Signed)
Subjective:    Patient ID: Matthew Hane., male    DOB: December 05, 1945, 67 y.o.   MRN: [id number]  Knee Pain  The incident occurred 3 to 5 days ago. The incident occurred at home. The injury mechanism was a fall. The pain is present in the right knee. The quality of the pain is described as stabbing. The pain is at a severity of 2/10. The pain has been intermittent since onset. The symptoms are aggravated by palpation and movement. He has tried acetaminophen, ice and immobilization for the symptoms. The treatment provided significant relief.  Shoulder Pain  The pain is present in the right shoulder. This is a recurrent problem. The current episode started 1 to 4 weeks ago. There has been no history of extremity trauma. The problem occurs intermittently. The problem has been gradually worsening. The quality of the pain is described as aching and sharp. The pain is at a severity of 4/10. Associated symptoms include a limited range of motion and stiffness. The symptoms are aggravated by cold. He has tried acetaminophen, OTC ointments and oral narcotics for the symptoms. The treatment provided mild relief.      Review of Systems  Constitutional: Negative.   HENT: Negative.   Eyes: Negative.   Respiratory: Negative.   Cardiovascular: Negative.   Gastrointestinal: Negative.   Endocrine: Negative.   Genitourinary: Negative.   Musculoskeletal: Positive for arthralgias (See HPI) and stiffness.  Skin: Negative.   Allergic/Immunologic: Negative.   Neurological: Negative.   Hematological: Negative.   Psychiatric/Behavioral: Negative.        Objective:   Physical Exam  Constitutional: He is oriented to person, place, and time. He appears well-developed and well-nourished.  HENT:  Head: Atraumatic.  Eyes: Conjunctivae and EOM are normal. Pupils are equal, round, and reactive to light.  Neck: Normal range of motion. Neck supple.  Cardiovascular: Normal rate and regular rhythm.    Pulmonary/Chest: Effort normal and breath sounds normal.  Abdominal: Soft. Bowel sounds are normal.  Musculoskeletal: He exhibits tenderness (Medial border of right knee at adductor tendons).  Decreased ROM right shoulder  Neurological: He is alert and oriented to person, place, and time. No cranial nerve deficit.  Skin: Skin is warm and dry.  Psychiatric: He has a normal mood and affect. His behavior is normal. Judgment and thought content normal.          Assessment & Plan:  1. Knee Pian: Pt had pre-existing issues of patellar malalignment and pain. He was scheduled for PT, but had a fall when his knee "gave out"  on him. He states that he had initial pain which decreased his ability to ambulate. He treated his knee with rest, "goody powder" and ice. He has had improvement in the pain but still has a feeling of weakness. I have discussed MRI to evaluatre the ligamentous structures but patient does not want to pursue this. I have examined the knee and there are no gross structural abnormalities. I am recommending that he proceed wit PT evaluation adn treatment. Ice and NSIDS PRN.  2. Shoulder Pain: Patient had an x-ray done on the last visit which shows degeneration of the acromioclavicular joint of the right. This has resulted in limitation of the patient's ADLs. The patient continues to have decreased range of motion and pain in the right shoulder with movement. I recommended outpatient therapy for physical modalities and strengthening and range of motion of the joint to allow for recovery of functional activity.  3. uncontrolled hypertension:  The patient has a ventral hypertension. He's had multiple adjustments to medication. The patient however was taking Lasix once a day and would likely benefit from the medication it has a half-life of 24 hours. I'm changing the patient to hydrochlorothiazide 25 mg daily. I will discontinue the Lasix 40 mg now. I have reviewed the patient's most recent  echocardiogram and there is no evidence of significant systolic or diastolic dysfunction. I've asked the patient to weigh himself on a daily basis and to record and record those weights. The patient is having increasing weight with hydrochlorothiazide and we will add Lasix back a when necessary basis.  Avan Gullett A.

## 2013-02-21 ENCOUNTER — Telehealth: Payer: Self-pay | Admitting: Hematology

## 2013-02-21 ENCOUNTER — Ambulatory Visit: Payer: Medicare Other | Admitting: Physical Therapy

## 2013-02-21 ENCOUNTER — Ambulatory Visit: Payer: Medicare Other | Attending: Internal Medicine | Admitting: Physical Therapy

## 2013-02-21 DIAGNOSIS — IMO0001 Reserved for inherently not codable concepts without codable children: Secondary | ICD-10-CM | POA: Insufficient documentation

## 2013-02-21 DIAGNOSIS — R262 Difficulty in walking, not elsewhere classified: Secondary | ICD-10-CM | POA: Insufficient documentation

## 2013-02-21 DIAGNOSIS — R269 Unspecified abnormalities of gait and mobility: Secondary | ICD-10-CM | POA: Insufficient documentation

## 2013-02-21 DIAGNOSIS — M2550 Pain in unspecified joint: Secondary | ICD-10-CM | POA: Insufficient documentation

## 2013-02-21 NOTE — Telephone Encounter (Signed)
Talked with Dr Ashley Royalty reagrding the continuation of benicar.  Benicar 20mg  po daily is to be continued as per Dr Ashley Royalty.  Message left on pt's cell phone to continue medication.

## 2013-02-21 NOTE — Telephone Encounter (Signed)
Pt called confused by new medication regime.  Pt states that he picked up his new medications and is confused about which meds he is supposed to discontinue.  On review of chart it states that Dr Ashley Royalty D/C'd Benicar, Altace and Lasix.  Pt requesting that I double check with Dr Ashley Royalty that he discontine Benicar. Will double check with Dr Ashley Royalty.

## 2013-02-21 NOTE — Telephone Encounter (Signed)
Attempted to call pt again to notify him to continue to take Benicar

## 2013-02-21 NOTE — Telephone Encounter (Signed)
[name] with physical therapy call and says patient received vasoneumatic tx to knee complaint of calf pain.  Removed device.  Pain resolved.  Patient instructed to call MD if pain comes back and to go to ED if shortness of breath due to history of PE.

## 2013-02-21 NOTE — Telephone Encounter (Signed)
Notified pt to continue taking Benicar 20 mg po daily.

## 2013-02-23 ENCOUNTER — Ambulatory Visit: Payer: Medicare Other | Admitting: Internal Medicine

## 2013-02-26 ENCOUNTER — Ambulatory Visit: Payer: Medicare Other | Admitting: Internal Medicine

## 2013-02-27 ENCOUNTER — Ambulatory Visit: Payer: Medicare Other | Admitting: Physical Therapy

## 2013-03-01 ENCOUNTER — Ambulatory Visit: Payer: Medicare Other | Admitting: Physical Therapy

## 2013-03-06 ENCOUNTER — Ambulatory Visit: Payer: Medicare Other | Attending: Internal Medicine | Admitting: Physical Therapy

## 2013-03-06 DIAGNOSIS — R262 Difficulty in walking, not elsewhere classified: Secondary | ICD-10-CM | POA: Insufficient documentation

## 2013-03-06 DIAGNOSIS — IMO0001 Reserved for inherently not codable concepts without codable children: Secondary | ICD-10-CM | POA: Insufficient documentation

## 2013-03-06 DIAGNOSIS — R269 Unspecified abnormalities of gait and mobility: Secondary | ICD-10-CM | POA: Insufficient documentation

## 2013-03-06 DIAGNOSIS — M2550 Pain in unspecified joint: Secondary | ICD-10-CM | POA: Insufficient documentation

## 2013-03-08 ENCOUNTER — Ambulatory Visit: Payer: Medicare Other | Admitting: Physical Therapy

## 2013-04-03 ENCOUNTER — Encounter: Payer: Self-pay | Admitting: Gastroenterology

## 2013-04-09 ENCOUNTER — Telehealth: Payer: Self-pay | Admitting: Internal Medicine

## 2013-04-11 ENCOUNTER — Other Ambulatory Visit: Payer: Self-pay

## 2013-04-16 ENCOUNTER — Ambulatory Visit (HOSPITAL_COMMUNITY)
Admission: AD | Admit: 2013-04-16 | Discharge: 2013-04-16 | Disposition: A | Discharge status: Home / Self Care | Payer: Medicare Other | Source: Ambulatory Visit | Attending: Internal Medicine | Admitting: Internal Medicine

## 2013-04-16 ENCOUNTER — Encounter: Payer: Self-pay | Admitting: Internal Medicine

## 2013-04-16 ENCOUNTER — Ambulatory Visit (INDEPENDENT_AMBULATORY_CARE_PROVIDER_SITE_OTHER): Payer: Self-pay | Admitting: Internal Medicine

## 2013-04-16 VITALS — BP 169/93 | HR 106 | Temp 97.9°F | Resp 18 | Ht 70.5 in | Wt 279.0 lb

## 2013-04-16 DIAGNOSIS — I10 Essential (primary) hypertension: Secondary | ICD-10-CM | POA: Insufficient documentation

## 2013-04-16 DIAGNOSIS — I4460 Unspecified fascicular block: Secondary | ICD-10-CM | POA: Insufficient documentation

## 2013-04-16 DIAGNOSIS — M25569 Pain in unspecified knee: Secondary | ICD-10-CM | POA: Insufficient documentation

## 2013-04-16 DIAGNOSIS — R000 Tachycardia, unspecified: Secondary | ICD-10-CM

## 2013-04-16 DIAGNOSIS — I4891 Unspecified atrial fibrillation: Secondary | ICD-10-CM | POA: Insufficient documentation

## 2013-04-16 DIAGNOSIS — M1710 Unilateral primary osteoarthritis, unspecified knee: Secondary | ICD-10-CM | POA: Insufficient documentation

## 2013-04-16 DIAGNOSIS — M25561 Pain in right knee: Secondary | ICD-10-CM

## 2013-04-16 LAB — BASIC METABOLIC PANEL
BUN: 17 mg/dL (ref 6–23)
CO2: 26 mEq/L (ref 19–32)
Calcium: 10.2 mg/dL (ref 8.4–10.5)
Chloride: 101 mEq/L (ref 96–112)
Creatinine, Ser: 0.9 mg/dL (ref 0.50–1.35)
GFR calc Af Amer: >90 mL/min (ref >90)
GFR calc non Af Amer: 87 mL/min — ABNORMAL LOW (ref >90)
Glucose, Bld: 119 mg/dL — ABNORMAL HIGH (ref 70–99)
Potassium: 3.5 mEq/L (ref 3.5–5.1)
Sodium: 136 mEq/L (ref 135–145)

## 2013-04-16 LAB — MAGNESIUM: Magnesium: 1.9 mg/dL (ref 1.5–2.5)

## 2013-04-16 MED ORDER — OLMESARTAN MEDOXOMIL 40 MG PO TABS: 40.0000 mg | ORAL_TABLET | Freq: Every day | ORAL | Status: DC

## 2013-04-16 MED ORDER — HYDROCODONE-ACETAMINOPHEN 10-325 MG PO TABS: 1.0000 | ORAL_TABLET | Freq: Four times a day (QID) | ORAL | Status: DC | PRN

## 2013-04-16 NOTE — Procedures (Addendum)
[facility]  Procedure Note  Matthew Ochoa. ZOX:[id number] DOB: August 01, 1946 DOA: 04/16/2013   PCP: MATTHEWS,MICHELLE A., MD   Associated Diagnosis: Hypertension, Right knee pain  Procedure Note: labs drawn per order   Condition During Procedure:tolerated without difficulty   Condition at Discharge:stable, no complaints   TATUM, Supriya Beaston, RN  [facility]

## 2013-04-16 NOTE — Progress Notes (Signed)
  Subjective:    Patient ID: Matthew Hane., male    DOB: [birth year], 67 y.o.   MRN: [id number]  HPI: Pt is here for follow up on HTN and also pain in the right knee. He states that he has been feeling well and has been back to playing golf. He notes that he does have some morning stiffness and pain which is decreased with use of Hydrocodone/APAP, and increased movement.  Pt also was started on HCTZ an his last visit and Lasix discontinued. He states that his weight has fluctuated in the range of 276 - 279 and that he has not had any pedal edema, orthopnea, PND, DOE, SOB or changes in abdominal girth.   Pt is also noted to have an elevated BP and HR today.    Review of Systems  Constitutional: Negative.   HENT: Negative.   Eyes: Negative.   Respiratory: Negative.   Cardiovascular: Negative.   Gastrointestinal: Negative.   Endocrine: Negative.   Genitourinary: Negative.   Musculoskeletal: Positive for joint swelling (right knee) and arthralgias. Negative for myalgias.  Skin: Negative.   Allergic/Immunologic: Negative.   Neurological: Negative.   Hematological: Negative.   Psychiatric/Behavioral: Negative.        Objective:   Physical Exam  Constitutional: He is oriented to person, place, and time. He appears well-developed and well-nourished.  HENT:  Head: Normocephalic and atraumatic.  Eyes: Conjunctivae and EOM are normal. Pupils are equal, round, and reactive to light. No scleral icterus.  Neck: Normal range of motion. Neck supple.  Cardiovascular: Normal rate and regular rhythm.  Exam reveals no gallop and no friction rub.   No murmur heard. Pulmonary/Chest: Effort normal and breath sounds normal. He has no wheezes. He has no rales. He exhibits no tenderness.  Abdominal: Soft. Bowel sounds are normal. He exhibits no mass.  Musculoskeletal: Normal range of motion.  Neurological: He is alert and oriented to person, place, and time.  Skin: Skin is warm and dry.   Psychiatric: He has a normal mood and affect. His behavior is normal. Judgment and thought content normal.          Assessment & Plan:  1. HTN: BP elevated. Pt is currently on 2 ARB's prescribed by 2 different Physicians both at maximum doses. The patient states that he had been on a B-blocker in the past but this was discontinued for reasons that he is unaware. I will discontinue Cozaar and continue Benicar at 40 mg daily. Pt is to check BP at home and report. If systolic persists > 160 and Diastolic > 90 will consider adding Norvasc 2.5-5 mg daily. Will also check BMET  and magnesium  2. Right Knee Pain: Pt has arthritis in right knee and was under the care of PT. He was released from PT and has been back performing his usual activity. He has been having morning pain and stiffness in the right knee and this is improved with Hydrocodone allowing him to engage in usual activity. Will prescribe Hydrocodone/APAP 10/325 mg # 30 tabs.  3.Tachycardia: Pt's initial HR was noted at 106. Pt has a history of A-fib and is on Amiodarone. A repeat check of HR showed HR of 88 BPM (manually) . 12 Lead EKG showed partial LBBB which is unchanged and HR or 86.  Labs: BMET, Mg BP Check: 1 week RTC: 1 month

## 2013-04-17 ENCOUNTER — Telehealth: Payer: Self-pay | Admitting: *Deleted

## 2013-04-19 ENCOUNTER — Encounter: Payer: Self-pay | Admitting: Hematology

## 2013-04-20 ENCOUNTER — Encounter: Payer: Self-pay | Admitting: Cardiology

## 2013-04-23 ENCOUNTER — Encounter: Payer: Self-pay | Admitting: Primary Care

## 2013-04-23 ENCOUNTER — Ambulatory Visit: Payer: Self-pay | Admitting: *Deleted

## 2013-04-23 ENCOUNTER — Ambulatory Visit (INDEPENDENT_AMBULATORY_CARE_PROVIDER_SITE_OTHER): Payer: Medicare Other | Admitting: Primary Care

## 2013-04-23 VITALS — BP 174/95 | HR 114 | Resp 18

## 2013-04-23 DIAGNOSIS — I10 Essential (primary) hypertension: Secondary | ICD-10-CM

## 2013-04-23 MED ORDER — AMLODIPINE BESYLATE 5 MG PO TABS: 5.0000 mg | ORAL_TABLET | Freq: Every day | ORAL | Status: DC

## 2013-04-23 NOTE — Progress Notes (Signed)
[facility]GRESS NOTE  Account Test [id number] DOB: 09/06/1967 DOA: (Not on file) PCP: Marthann Schiller, MD 04/23/2013   Chief Complaint  Patient presents with  . Hypertension    ELEVATED BP    Subjective:  Matthew Ochoa is a 67 y/o AAM who was initially in for a nurse visit for BP ck. His Bp remained elevated and was seen for an acute visit.  Review of Systems  Constitutional: Negative.   HENT: Negative.   Eyes: Negative.   Respiratory: Negative.   Cardiovascular: Negative.   Gastrointestinal: Negative.   Genitourinary: Negative.   Musculoskeletal: Positive for back pain and joint pain.       Right  Knee aches wears a knee brace for support  Skin: Negative.     No Known Allergies   Outpatient Encounter Prescriptions as of 04/23/2013  Medication Sig Dispense Refill  . amiodarone (PACERONE) 100 MG tablet Take 100 mg by mouth. 1/2 tablet everyday      . aspirin 81 MG tablet Take 81 mg by mouth daily.        . CRESTOR 10 MG tablet Take 10 mg by mouth every evening.       . hydrochlorothiazide (HYDRODIURIL) 25 MG tablet Take 1 tablet (25 mg total) by mouth daily.  90 tablet  3  . HYDROcodone-acetaminophen (NORCO) 10-325 MG per tablet Take 1 tablet by mouth every 6 (six) hours as needed for pain.  30 tablet  0  . metFORMIN (GLUCOPHAGE) 500 MG tablet Take 500 tablets by mouth 2 (two) times daily with a meal.       . nitroGLYCERIN (NITROSTAT) 0.4 MG SL tablet Place 0.4 mg under the tongue every 5 (five) minutes as needed. For chest pain      . olmesartan (BENICAR) 40 MG tablet Take 1 tablet (40 mg total) by mouth daily.      . potassium citrate (UROCIT-K) 10 MEQ (1080 MG) SR tablet Take 2 tablets (20 mEq total) by mouth 3 (three) times daily with meals.  60 tablet  11  . warfarin (COUMADIN) 5 MG tablet Take 7.5 mg by mouth See admin instructions. 5 days a week (none on Sundays  And  Weds.)       No facility-administered encounter medications on file as of 04/23/2013.         Physical Exam   Filed Vitals:   04/23/13 1607  BP: 174/95  Pulse: 114  Resp: 18    General: Alert, awake, oriented x3, in no acute distress.  HEENT: [place]/AT PEERL, EOMI, cerumen impaction right ear  Neck: Trachea midline,  no masses, no thyromegal,y no JVD, no carotid bruit OROPHARYNX:  Moist, No exudate/ erythema/lesions. Dentures upper.  Heart: Regular rate and rhythm, without murmurs, rubs, gallops, PMI non-displaced, no heaves or thrills on palpation.  Lungs: Clear to auscultation, no wheezing or rhonchi noted. No increased vocal fremitus resonant to percussion  Abdomen: obese  Soft, nontender, nondistended, positive bowel sounds, no masses no hepatosplenomegaly noted..  Neuro: No focal neurological deficits noted cranial nerves II through XII grossly intact.  Musculoskeletal: No warm swelling or erythema around joints, no spinal tenderness noted. Psychiatric: Patient alert and oriented x3,  Lymph node survey: No cervical axillary or inguinal lymphadenopathy noted.   Assessment/Plan:  Hypertension: remains elevated with HR of 114 on today and manually 110. Recently Benicar was adjusted to 40mg  QD with little improvement. Will add Norvasc (CCB)5mg  QD and return to office next week for BP ck.  Impacted  right ear- OTC debrox follow instructions     Gwinda Passe, NP-C

## 2013-04-30 ENCOUNTER — Telehealth: Payer: Self-pay | Admitting: *Deleted

## 2013-05-15 ENCOUNTER — Telehealth: Payer: Self-pay | Admitting: *Deleted

## 2013-05-21 ENCOUNTER — Ambulatory Visit (INDEPENDENT_AMBULATORY_CARE_PROVIDER_SITE_OTHER): Payer: Medicare Other | Admitting: Internal Medicine

## 2013-05-21 ENCOUNTER — Encounter: Payer: Self-pay | Admitting: Internal Medicine

## 2013-05-21 ENCOUNTER — Ambulatory Visit (HOSPITAL_COMMUNITY)
Admission: AD | Admit: 2013-05-21 | Discharge: 2013-05-21 | Disposition: A | Discharge status: Home / Self Care | Payer: Medicare Other | Source: Ambulatory Visit | Attending: Internal Medicine | Admitting: Internal Medicine

## 2013-05-21 VITALS — BP 142/88 | HR 84 | Temp 98.0°F | Resp 18 | Ht 70.0 in | Wt 277.0 lb

## 2013-05-21 DIAGNOSIS — Z7901 Long term (current) use of anticoagulants: Secondary | ICD-10-CM | POA: Insufficient documentation

## 2013-05-21 DIAGNOSIS — M25569 Pain in unspecified knee: Secondary | ICD-10-CM | POA: Insufficient documentation

## 2013-05-21 DIAGNOSIS — M25561 Pain in right knee: Secondary | ICD-10-CM

## 2013-05-21 DIAGNOSIS — E118 Type 2 diabetes mellitus with unspecified complications: Secondary | ICD-10-CM | POA: Insufficient documentation

## 2013-05-21 DIAGNOSIS — IMO0001 Reserved for inherently not codable concepts without codable children: Secondary | ICD-10-CM

## 2013-05-21 DIAGNOSIS — I10 Essential (primary) hypertension: Secondary | ICD-10-CM

## 2013-05-21 LAB — PROTIME-INR
INR: 1.92 — ABNORMAL HIGH (ref 0.00–1.49)
Prothrombin Time: 21.4 seconds — ABNORMAL HIGH (ref 11.6–15.2)

## 2013-05-21 MED ORDER — HYDROCODONE-ACETAMINOPHEN 10-325 MG PO TABS: 1.0000 | ORAL_TABLET | Freq: Four times a day (QID) | ORAL | Status: DC | PRN

## 2013-05-21 NOTE — Progress Notes (Signed)
  Subjective:    Patient ID: Matthew Ochoa., male    DOB: [birth year], 67 y.o.   MRN: [id number]  HPIPt here to follow up on chronic conditions of DM and HTN. Pt also reports blood in ejaculatory fluid which is a new occurrence in the last 2 months. He states that he addressed this at the Urologist office and was told by the PA that this can happen if on Coumadin. He is concerned about his coumadin levels.    Review of Systems  Constitutional: Negative.   HENT: Negative.   Eyes: Negative.   Respiratory: Negative.   Cardiovascular: Negative.   Gastrointestinal: Negative.   Endocrine: Negative.   Genitourinary: Positive for discharge (blood in ejaculatory fluid).  Musculoskeletal: Positive for arthralgias. Negative for myalgias.  Skin: Negative.   Allergic/Immunologic: Negative.   Neurological: Negative.   Hematological: Negative.   Psychiatric/Behavioral: Negative.        Objective:   Physical Exam  Constitutional: He is oriented to person, place, and time. He appears well-developed and well-nourished.  HENT:  Head: Atraumatic.  Eyes: Conjunctivae and EOM are normal. Pupils are equal, round, and reactive to light.  Neck: Normal range of motion. Neck supple.  Cardiovascular: Normal rate and regular rhythm.  Exam reveals no gallop and no friction rub.   No murmur heard. Pulmonary/Chest: Effort normal and breath sounds normal. He has no wheezes. He has no rales. He exhibits no tenderness.  Abdominal: Soft. Bowel sounds are normal. He exhibits no mass.  Musculoskeletal: Normal range of motion.  Neurological: He is alert and oriented to person, place, and time.  Skin: Skin is warm and dry.  Psychiatric: He has a normal mood and affect. His behavior is normal. Judgment and thought content normal.          Assessment & Plan:  1. Blood in Ejaculatory fluid: Pt on Coumadin that is usually followed by Dr. Sharyn Lull. However he reports ongoing blood in ejaculatory fluid which is new  for a few months. I will check PT/INR to ensure that it is therapeutic.  2.Diabetes: Pt has no been checking blood sugars on a regular basis however reviewed last Hb A1c was at 6.0.   3.HTN: BP improved with addition of Norvasc 5 mg. Continue with current regimen. Will re-check again in 3 months by which time medications should have had maximal effect.  4. Arthritis: PT states that jointt pain is improved but he still has some pain with activity. Pt has been going to the gym and exercising and feels that this has been helping with pain.    5. Preventative Care: Pt states that he will obtain a free influenza vaccine from his former place of employment. He declined a flu shot here today.

## 2013-05-21 NOTE — Procedures (Signed)
[facility]  Procedure Note  Matthew Ochoa. ZOX:[id number] DOB: 1946/02/13 DOA: 05/21/2013   PCP: MATTHEWS,MICHELLE A., MD   Associated Diagnosis: Type II or unspecified type diabetes mellitus without mention of complication, uncontrolled; chronic anticoagulation  Procedure Note: Labs drawn per order   Condition During Procedure: Patient tolerated without difficulty   Condition at Discharge: Patient stable   Lanae Boast, RN  [facility]

## 2013-05-22 LAB — HEMOGLOBIN A1C
Hgb A1c MFr Bld: 7.3 % — ABNORMAL HIGH (ref <5.7)
Mean Plasma Glucose: 163 mg/dL — ABNORMAL HIGH (ref <117)

## 2013-05-24 ENCOUNTER — Encounter: Payer: Self-pay | Admitting: *Deleted

## 2013-05-24 ENCOUNTER — Telehealth: Payer: Self-pay | Admitting: *Deleted

## 2013-06-06 ENCOUNTER — Telehealth: Payer: Self-pay | Admitting: *Deleted

## 2013-06-08 ENCOUNTER — Telehealth: Payer: Self-pay | Admitting: *Deleted

## 2013-06-15 ENCOUNTER — Ambulatory Visit (INDEPENDENT_AMBULATORY_CARE_PROVIDER_SITE_OTHER): Payer: Medicare Other | Admitting: Internal Medicine

## 2013-06-15 ENCOUNTER — Encounter: Payer: Self-pay | Admitting: Internal Medicine

## 2013-06-15 VITALS — BP 147/91 | HR 106 | Temp 98.0°F | Resp 18 | Ht 70.5 in | Wt 283.0 lb

## 2013-06-15 DIAGNOSIS — Z86711 Personal history of pulmonary embolism: Secondary | ICD-10-CM

## 2013-06-15 DIAGNOSIS — Z7901 Long term (current) use of anticoagulants: Secondary | ICD-10-CM

## 2013-06-15 LAB — PSA, MEDICARE: PSA: 1.94 ng/mL (ref <=4.00)

## 2013-06-15 NOTE — Telephone Encounter (Signed)
Medication management questions about coming off coumadin

## 2013-06-15 NOTE — Progress Notes (Signed)
  Subjective:    Patient ID: Matthew Hane., male    DOB: 27-Sep-1945, 67 y.o.   MRN: [id number]  HPI: Pt here today as he is to follow up on Coumadin. Pt has been on Coumadin for 1 year after having a PE without any identifiable precipitating factors. Pt did not have a hypercoagulable work-up for PE.   Reported in pt's medical history and noted in record was a diagnosis of Atrial fibrillation. However I spoke with Pt's cardiologist Dr. Sharyn Lull who reports that patient has an Atrial Tachyarrythnmia that is not Atrial Fibrillation and that Coumadn is not indicaed.     Review of Systems  Constitutional: Negative.   HENT: Negative.   Eyes: Negative.   Respiratory: Negative.   Cardiovascular: Negative.   Gastrointestinal: Negative.   Endocrine: Negative.   Genitourinary: Negative.   Skin: Negative.   Allergic/Immunologic: Negative.   Neurological: Negative.   Hematological: Negative.   Psychiatric/Behavioral: Negative.        Objective:   Physical Exam  Constitutional: He is oriented to person, place, and time. He appears well-developed and well-nourished.  HENT:  Head: Atraumatic.  Eyes: Conjunctivae and EOM are normal. Pupils are equal, round, and reactive to light. No scleral icterus.  Neck: Normal range of motion. Neck supple.  Cardiovascular: Normal rate and regular rhythm.  Exam reveals no gallop and no friction rub.   No murmur heard. Pulmonary/Chest: Effort normal and breath sounds normal. He has no wheezes. He has no rales. He exhibits no tenderness.  Abdominal: Soft. Bowel sounds are normal. He exhibits no mass.  Musculoskeletal: Normal range of motion.  Neurological: He is alert and oriented to person, place, and time.  Skin: Skin is warm and dry.  Psychiatric: He has a normal mood and affect. His behavior is normal. Judgment and thought content normal.          Assessment & Plan:  1 H/O PE: Will discontinue Coumadin after 1 year of therapy. Check  hypercoagulable panel in one month. Then check D-dimer in 2 months and if negative and will not restart Coumadin. Will also check CEA and PSA.  2. Chronic Anticoagulation: PT has been on Warfarin for 1 year after unprovoked PE. Will discontinue Warfarin today.

## 2013-06-15 NOTE — Telephone Encounter (Signed)
Pt has concerns about coming off coumadin

## 2013-06-15 NOTE — Telephone Encounter (Signed)
Pt has question about dental work and being on coumadin

## 2013-06-16 LAB — CEA: CEA: <0.5 ng/mL (ref 0.0–5.0)

## 2013-06-25 ENCOUNTER — Other Ambulatory Visit: Payer: Self-pay | Admitting: Internal Medicine

## 2013-07-16 ENCOUNTER — Ambulatory Visit: Payer: Medicare Other | Admitting: Internal Medicine

## 2013-07-18 ENCOUNTER — Encounter: Payer: Self-pay | Admitting: Internal Medicine

## 2013-07-18 ENCOUNTER — Ambulatory Visit (INDEPENDENT_AMBULATORY_CARE_PROVIDER_SITE_OTHER): Payer: Medicare Other | Admitting: Internal Medicine

## 2013-07-18 VITALS — BP 130/90 | HR 96 | Temp 97.9°F | Resp 18 | Ht 69.75 in | Wt 278.0 lb

## 2013-07-18 DIAGNOSIS — I10 Essential (primary) hypertension: Secondary | ICD-10-CM

## 2013-07-18 DIAGNOSIS — Z860100 Personal history of colon polyps, unspecified: Secondary | ICD-10-CM

## 2013-07-18 DIAGNOSIS — I2699 Other pulmonary embolism without acute cor pulmonale: Secondary | ICD-10-CM

## 2013-07-18 DIAGNOSIS — E1129 Type 2 diabetes mellitus with other diabetic kidney complication: Secondary | ICD-10-CM

## 2013-07-18 DIAGNOSIS — Z23 Encounter for immunization: Secondary | ICD-10-CM

## 2013-07-18 DIAGNOSIS — N058 Unspecified nephritic syndrome with other morphologic changes: Secondary | ICD-10-CM

## 2013-07-18 MED ORDER — HYDROCHLOROTHIAZIDE 25 MG PO TABS: 25.0000 mg | ORAL_TABLET | Freq: Every day | ORAL | Status: DC

## 2013-07-18 MED ORDER — METFORMIN HCL 500 MG PO TABS: ORAL_TABLET | ORAL | Status: DC

## 2013-07-18 NOTE — Progress Notes (Signed)
Subjective:    Patient ID: Matthew Ochoa., male    DOB: Jul 02, 1946, 67 y.o.   MRN: [id number]  HPI Pt here for follow up on chronic diseases. Pt has not been compliant with his diet and states that he has not been exercising as much wit the change in weather (golfing). He denies any palpitations or chest pains. He has not had any swelling.   Pt had previously been on Coumadin secondary to a PE without having had an evaluation for hypercoagulable factors. He was taken off coumadin. PSA and CEA were within normal limits.  Pt has been off Coumadin for 5 weeks and the plan was to obtain a hypercoaglable panel in 6 weeks form stop date and a D-dimer at 6 weeks and 12 weeks.    Review of Systems  Constitutional: Negative.   HENT: Negative.   Eyes: Negative.   Respiratory: Negative.   Cardiovascular: Negative.   Gastrointestinal: Negative.   Endocrine: Negative.   Genitourinary: Negative.   Musculoskeletal: Positive for arthralgias (occasional knee pain).  Skin: Negative.   Allergic/Immunologic: Negative.   Neurological: Negative.   Hematological: Negative.   Psychiatric/Behavioral: Negative.        Objective:   Physical Exam  Constitutional: He is oriented to person, place, and time. He appears well-developed and well-nourished. He appears distressed.  HENT:  Head: Atraumatic.  Eyes: Conjunctivae and EOM are normal. Pupils are equal, round, and reactive to light. No scleral icterus.  Neck: Normal range of motion. Neck supple.  Cardiovascular: Normal rate and regular rhythm.  Exam reveals no gallop and no friction rub.   No murmur heard. Pulmonary/Chest: Effort normal and breath sounds normal. He has no wheezes. He has no rales. He exhibits no tenderness.  Abdominal: Soft. Bowel sounds are normal. He exhibits no mass.  Genitourinary:  Pt has deferred testicular examination. States that he has that done at his Urologist (Dr. Brunilda Payor)  Musculoskeletal: Normal range of motion.   Neurological: He is alert and oriented to person, place, and time.  Skin: Skin is warm and dry.  Psychiatric: He has a normal mood and affect. His behavior is normal. Judgment and thought content normal.          Assessment & Plan:  1. HTN: BP (repeat 139/94) not at goal for DM. May need to go up on Benicar. However, patient states that he is unable to afford a change in medication and would like to work on his diet 1st as he has been eating foods high in salt and has been out of compliance. Will continue his current medication regimen anda recheck in 3 months. If no improvement with diet modification, will increase Benicar to 80 mg daily.  2. DM II: Last Hb A1c elevated. Will increase metformin to 1000 mg in am and 500 mg in PM. Per Pharmacy Dr. [name] ( who has not been Matthew Ochoa  Physician for at least 6 mionths) has been ordering automatic refills. I have instructed the Pharmacy to route all refill requests to out office except for those prescribed by Dr. Sharyn Lull. Re check Hb A1c  and BMET in 1 week.  3. H/O Pulmonary Embolus: Pt to have hypercoagulable panel and D-dimer in 1 week.   4. H/O Polyp: Needs colonoscopy. Will follow up with Dr. Arlyce Dice.  5. Health Maintenance: Pt was planning on getting his influenza vaccine at his previous employers but was not able to obtain this. He will have ann influenza vaccine today.  Greater than 50%  of time spent in discussion and counseling.  Labs: Hypercoagulable panel, D-Dimer, Hb A1c, BMET in 1 week

## 2013-07-23 ENCOUNTER — Encounter: Payer: Self-pay | Admitting: Physician Assistant

## 2013-07-23 ENCOUNTER — Telehealth: Payer: Self-pay | Admitting: Physician Assistant

## 2013-07-23 ENCOUNTER — Ambulatory Visit (INDEPENDENT_AMBULATORY_CARE_PROVIDER_SITE_OTHER): Payer: Medicare Other | Admitting: Physician Assistant

## 2013-07-23 VITALS — BP 110/76 | HR 72 | Ht 69.75 in | Wt 278.2 lb

## 2013-07-23 DIAGNOSIS — Z86010 Personal history of colonic polyps: Secondary | ICD-10-CM

## 2013-07-23 MED ORDER — MOVIPREP 100 G PO SOLR: 1.0000 | Freq: Once | ORAL | Status: DC

## 2013-07-23 NOTE — Patient Instructions (Signed)
You have been scheduled for a colonoscopy with propofol. Please follow written instructions given to you at your visit today.  Please pick up your prep kit at the pharmacy within the next 1-3 days. [facility]. Also get 1 bottle of Magnesium Citrate at the pharmacy.

## 2013-07-23 NOTE — Progress Notes (Signed)
Reviewed and agree with management. Robert D. Kaplan, M.D., FACG  

## 2013-07-23 NOTE — Progress Notes (Signed)
Subjective:    Patient ID: Matthew Ochoa., male    DOB: 1945/10/18, 68 y.o.   MRN: [id number]  HPI  Matthew Ochoa is a pleasant 67 year old African American male known to Dr. Arlyce Dice who has history of adenomatous colon polyps and last had colonoscopy in February 2011. He had several polyps removed at that time the largest 1 cm. He had a poor prep and therefore was scheduled for recall at a 3 year interval. He has no current GI complaints other than very mild occasional constipation. He denies any abdominal pain. He has not noted any melena or hematochezia. His family history is negative for colon cancer polyps as far as he is aware. Other medical problems include adult onset diabetes mellitus, obesity, hypertension, sleep apnea. He does have congestive heart failure with any of the 50-55%. He had a pulmonary embolus about a year ago without any precipitating factors and was on Coumadin for one year. This was stopped a couple of weeks ago. He says he feels fine.    Review of Systems  Constitutional: Negative.   HENT: Negative.   Eyes: Negative.   Respiratory: Negative.   Cardiovascular: Negative.   Gastrointestinal: Positive for constipation.  Endocrine: Negative.   Genitourinary: Negative.   Musculoskeletal: Negative.   Skin: Negative.   Allergic/Immunologic: Negative.   Neurological: Negative.   Hematological: Negative.   Psychiatric/Behavioral: Negative.    Outpatient Prescriptions Prior to Visit  Medication Sig Dispense Refill  . amiodarone (PACERONE) 100 MG tablet Take 100 mg by mouth. 1/2 tablet everyday      . amLODipine (NORVASC) 5 MG tablet Take 1 tablet (5 mg total) by mouth daily.  30 tablet  11  . aspirin 81 MG tablet Take 81 mg by mouth daily.        . CRESTOR 10 MG tablet Take 10 mg by mouth every evening.       . hydrochlorothiazide (HYDRODIURIL) 25 MG tablet Take 1 tablet (25 mg total) by mouth daily.  90 tablet  3  . HYDROcodone-acetaminophen (NORCO) 10-325 MG per tablet  Take 1 tablet by mouth every 6 (six) hours as needed for pain.  30 tablet  0  . metFORMIN (GLUCOPHAGE) 500 MG tablet Take 2 tablets in the morning and 1 tablet in the evening  90 tablet  1  . nitroGLYCERIN (NITROSTAT) 0.4 MG SL tablet Place 0.4 mg under the tongue every 5 (five) minutes as needed. For chest pain      . olmesartan (BENICAR) 40 MG tablet Take 1 tablet (40 mg total) by mouth daily.      . potassium citrate (UROCIT-K) 10 MEQ (1080 MG) SR tablet Take 2 tablets (20 mEq total) by mouth 3 (three) times daily with meals.  60 tablet  11   No facility-administered medications prior to visit.   No Known Allergies Patient Active Problem List   Diagnosis Date Noted  . Chronic anticoagulation 05/21/2013  . Type II or unspecified type diabetes mellitus without mention of complication, uncontrolled 05/21/2013  . Knee pain, acute 05/21/2013  . Patellar tendinitis 02/20/2013  . Knee strain 02/20/2013  . HTN (hypertension), malignant 02/20/2013  . Frozen shoulder syndrome 02/20/2013  . Degenerative joint disease of acromioclavicular joint 02/20/2013  . Chondromalacia of patella 01/24/2013  . Pain in joint, shoulder region 01/24/2013  . HTN (hypertension) 01/24/2013  . Other and unspecified hyperlipidemia 01/24/2013  . Diabetes 01/24/2013  . Pulmonary embolus 01/24/2013  . CAD (coronary artery disease) of artery bypass graft 01/24/2013  .  Chronic diastolic CHF (congestive heart failure), NYHA class 1 01/24/2013  . Pulmonary embolism 07/02/2012  . Atrial tachycardia 07/02/2012  . Edema leg 07/02/2012  . Hyperlipidemia 07/02/2012  . CAD (coronary artery disease) 07/02/2012  . Subacute bronchitis 01/11/2012  . Cholelithiasis 12/27/2011  . Syncope 10/09/2011    Class: Acute  . Abdominal pain, RUQ (right upper quadrant) 10/09/2011    Class: Acute  . COPD with emphysema 09/05/2011  . Dyspnea on exertion 08/01/2011  . DIABETES, TYPE 2 02/04/2008  . OBSTRUCTIVE SLEEP APNEA 01/22/2008  .  HYPERTENSION 01/22/2008  . ALLERGIC RHINITIS 01/22/2008   History  Substance Use Topics  . Smoking status: Former Smoker -- 1.00 packs/day for 30 years    Types: Cigarettes    Quit date: 04/13/2011  . Smokeless tobacco: Never Used  . Alcohol Use: Yes     Comment: Socially   family history includes Asthma in his sister; Breast cancer in his sister; Lung cancer in his father.     Objective:   Physical Exam well-developed older African American male in no acute distress, pleasant blood pressure 110/76 pulse 72 height 5 foot 9 weight 278. HEENT; nontraumatic normocephalic EOMI PERRLA sclera anicteric, Supple; no JVD, Cardiovascular; regular rate and rhythm with S1-S2 no murmur or gallop, Pulmonary; clear bilaterally, Abdomen; large soft nontender nondistended bowel sounds are active there is no palpable mass or hepatosplenomegaly ,, Rectal ;exam not done, Extremities; no clubbing cyanosis or edema skin warm and dry, Psych ;mood and affect normal and appropriate        Assessment & Plan:  #32  67 year old male with history of adenomatous colon polyps due for 3 year interval followup. Last colonoscopy February 2011 with several polyps and a poor prep #2 history of pulmonary embolus-patient off Coumadin at this point after 1 year #3 congestive heart failure with EF 50-55% #4.onset diabetes mellitus #5 sleep apnea #6 hypertension  Plan; patient is scheduled for colonoscopy with Dr. Arlyce Dice. Procedure was discussed with the patient in detail he is agreeable to proceed. He will do a Movi -prep, and will also use 1 bottle of mag citrate 3 hours prior to starting the movi-prep.

## 2013-07-24 ENCOUNTER — Telehealth: Payer: Self-pay | Admitting: *Deleted

## 2013-07-24 ENCOUNTER — Other Ambulatory Visit: Payer: Self-pay | Admitting: *Deleted

## 2013-07-24 DIAGNOSIS — Z86010 Personal history of colonic polyps: Secondary | ICD-10-CM

## 2013-07-24 MED ORDER — MOVIPREP 100 G PO SOLR: 1.0000 | Freq: Once | ORAL | Status: DC

## 2013-07-24 NOTE — Telephone Encounter (Signed)
[facility] [address]  Phoned in Moviprep, 1 kit, no refills. Take as directed. Perscriber is [employer].

## 2013-07-25 ENCOUNTER — Other Ambulatory Visit: Payer: Medicare Other | Admitting: *Deleted

## 2013-07-25 DIAGNOSIS — E1129 Type 2 diabetes mellitus with other diabetic kidney complication: Secondary | ICD-10-CM

## 2013-07-25 DIAGNOSIS — I2699 Other pulmonary embolism without acute cor pulmonale: Secondary | ICD-10-CM

## 2013-07-25 LAB — BASIC METABOLIC PANEL
BUN: 12 mg/dL (ref 6–23)
CO2: 25 mEq/L (ref 19–32)
Calcium: 9.6 mg/dL (ref 8.4–10.5)
Chloride: 102 mEq/L (ref 96–112)
Creat: 1.02 mg/dL (ref 0.50–1.35)
Glucose, Bld: 123 mg/dL — ABNORMAL HIGH (ref 70–99)
Potassium: 3.9 mEq/L (ref 3.5–5.3)
Sodium: 136 mEq/L (ref 135–145)

## 2013-07-25 LAB — HEMOGLOBIN A1C
Hgb A1c MFr Bld: 7 % — ABNORMAL HIGH (ref <5.7)
Mean Plasma Glucose: 154 mg/dL — ABNORMAL HIGH (ref <117)

## 2013-07-25 LAB — HOMOCYSTEINE: Homocysteine: 14.2 umol/L (ref 4.0–15.4)

## 2013-07-26 LAB — LUPUS ANTICOAGULANT PANEL
DRVVT: 33.2 secs (ref <42.9)
Lupus Anticoagulant: NOT DETECTED
PTT Lupus Anticoagulant: 29.9 secs (ref 28.0–43.0)

## 2013-07-26 LAB — ANTITHROMBIN III: AntiThromb III Func: 93 % (ref 76–126)

## 2013-07-26 LAB — FACTOR 5 LEIDEN

## 2013-07-26 LAB — PROTEIN S ACTIVITY: Protein S Activity: 98 % (ref 69–129)

## 2013-07-26 LAB — PROTHROMBIN GENE MUTATION

## 2013-07-26 LAB — CARDIOLIPIN ANTIBODIES, IGG, IGM, IGA
Anticardiolipin IgA: 7 APL U/mL (ref <22)
Anticardiolipin IgG: 29 GPL U/mL — ABNORMAL HIGH (ref <23)
Anticardiolipin IgM: 2 MPL U/mL (ref <11)

## 2013-07-26 LAB — PROTEIN C ACTIVITY: Protein C Activity: 168 % — ABNORMAL HIGH (ref 75–133)

## 2013-07-26 LAB — D-DIMER, QUANTITATIVE: D-Dimer, Quant: 0.39 ug/mL-FEU (ref 0.00–0.48)

## 2013-07-27 LAB — PROTEIN S, TOTAL: Protein S Total: 92 % (ref 60–150)

## 2013-07-27 LAB — PROTEIN C, TOTAL: Protein C, Total: 98 % (ref 72–160)

## 2013-08-13 ENCOUNTER — Ambulatory Visit: Payer: Medicare Other | Admitting: Internal Medicine

## 2013-08-20 ENCOUNTER — Ambulatory Visit: Payer: Medicare Other | Admitting: Internal Medicine

## 2013-08-27 ENCOUNTER — Other Ambulatory Visit: Payer: Self-pay | Admitting: *Deleted

## 2013-08-27 ENCOUNTER — Telehealth: Payer: Self-pay | Admitting: *Deleted

## 2013-08-27 NOTE — Telephone Encounter (Signed)
Pt would like to know lab results. Pt was informed he will received telephone call when lab results are in

## 2013-08-27 NOTE — Telephone Encounter (Signed)
Called Rx [facility] Potassium 10 MEQ with 3 refill

## 2013-08-27 NOTE — Telephone Encounter (Signed)
Pt Requested refill on Potassium citrate 10 MEQ # [facility]  [address]

## 2013-08-28 NOTE — Telephone Encounter (Signed)
Discussed results of hypercoag panel with Mr. Julian. Repeat D-dimer in 8 weeks. Continue off coumadin.

## 2013-09-24 ENCOUNTER — Other Ambulatory Visit: Payer: Self-pay | Admitting: Internal Medicine

## 2013-10-02 ENCOUNTER — Ambulatory Visit (AMBULATORY_SURGERY_CENTER): Payer: Medicare HMO | Admitting: Gastroenterology

## 2013-10-02 ENCOUNTER — Encounter: Payer: Self-pay | Admitting: Gastroenterology

## 2013-10-02 VITALS — BP 158/107 | HR 72 | Temp 97.1°F | Resp 14 | Ht 69.75 in | Wt 278.0 lb

## 2013-10-02 DIAGNOSIS — D126 Benign neoplasm of colon, unspecified: Secondary | ICD-10-CM

## 2013-10-02 DIAGNOSIS — Z86010 Personal history of colonic polyps: Secondary | ICD-10-CM

## 2013-10-02 DIAGNOSIS — K5730 Diverticulosis of large intestine without perforation or abscess without bleeding: Secondary | ICD-10-CM

## 2013-10-02 LAB — GLUCOSE, CAPILLARY
Glucose-Capillary: 142 mg/dL — ABNORMAL HIGH (ref 70–99)
Glucose-Capillary: 123 mg/dL — ABNORMAL HIGH (ref 70–99)

## 2013-10-02 MED ORDER — SODIUM CHLORIDE 0.9 % IV SOLN: 500.0000 mL | INTRAVENOUS | Status: DC

## 2013-10-02 NOTE — Patient Instructions (Signed)
YOU HAD AN ENDOSCOPIC PROCEDURE TODAY AT THE [facility]: Refer to the procedure report that was given to you for any specific questions about what was found during the examination.  If the procedure report does not answer your questions, please call your gastroenterologist to clarify.  If you requested that your care partner not be given the details of your procedure findings, then the procedure report has been included in a sealed envelope for you to review at your convenience later.  YOU SHOULD EXPECT: Some feelings of bloating in the abdomen. Passage of more gas than usual.  Walking can help get rid of the air that was put into your GI tract during the procedure and reduce the bloating. If you had a lower endoscopy (such as a colonoscopy or flexible sigmoidoscopy) you may notice spotting of blood in your stool or on the toilet paper. If you underwent a bowel prep for your procedure, then you may not have a normal bowel movement for a few days.  DIET: Your first meal following the procedure should be a light meal and then it is ok to progress to your normal diet.  A half-sandwich or bowl of soup is an example of a good first meal.  Heavy or fried foods are harder to digest and may make you feel nauseous or bloated.  Likewise meals heavy in dairy and vegetables can cause extra gas to form and this can also increase the bloating.  Drink plenty of fluids but you should avoid alcoholic beverages for 24 hours.  ACTIVITY: Your care partner should take you home directly after the procedure.  You should plan to take it easy, moving slowly for the rest of the day.  You can resume normal activity the day after the procedure however you should NOT DRIVE or use heavy machinery for 24 hours (because of the sedation medicines used during the test).    SYMPTOMS TO REPORT IMMEDIATELY: A gastroenterologist can be reached at any hour.  During normal business hours, 8:30 AM to 5:00 PM Monday through Friday,  call [phone].  After hours and on weekends, please call the GI answering service at [phone] who will take a message and have the physician on call contact you.   Following lower endoscopy (colonoscopy or flexible sigmoidoscopy):  Excessive amounts of blood in the stool  Significant tenderness or worsening of abdominal pains  Swelling of the abdomen that is new, acute  Fever of 100F or higher  FOLLOW UP: If any biopsies were taken you will be contacted by phone or by letter within the next 1-3 weeks.  Call your gastroenterologist if you have not heard about the biopsies in 3 weeks.  Our staff will call the home number listed on your records the next business day following your procedure to check on you and address any questions or concerns that you may have at that time regarding the information given to you following your procedure. This is a courtesy call and so if there is no answer at the home number and we have not heard from you through the emergency physician on call, we will assume that you have returned to your regular daily activities without incident.  SIGNATURES/CONFIDENTIALITY: You and/or your care partner have signed paperwork which will be entered into your electronic medical record.  These signatures attest to the fact that that the information above on your After Visit Summary has been reviewed and is understood.  Full responsibility of the confidentiality of this   discharge information lies with you and/or your care-partner.  Recommendations See procedure report  

## 2013-10-02 NOTE — Progress Notes (Signed)
No allergy to eggs or soy products. ewm No problems with past sedation. emw

## 2013-10-02 NOTE — Progress Notes (Signed)
Report to pacu rn, vss, bbs=clear 

## 2013-10-02 NOTE — Progress Notes (Signed)
Called to room to assist during endoscopic procedure.  Patient ID and intended procedure confirmed with present staff. Received instructions for my participation in the procedure from the performing physician.  

## 2013-10-02 NOTE — Op Note (Signed)
[place]  [address]. [place], [id number]   COLONOSCOPY PROCEDURE REPORT  PATIENT: Ayush, Boulet.  MR#: [id number] BIRTHDATE: [birth year] , 67  yrs. old GENDER: Male ENDOSCOPIST: Inda Castle, MD REFERRED BY: PROCEDURE DATE:  10/02/2013 PROCEDURE:   Colonoscopy with snare polypectomy First Screening Colonoscopy - Avg.  risk and is 50 yrs.  old or older - No.  Prior Negative Screening - Now for repeat screening. N/A  History of Adenoma - Now for follow-up colonoscopy & has been > or = to 3 yrs.  Yes hx of adenoma.  Has been 3 or more years since last colonoscopy.  Polyps Removed Today? Yes. ASA CLASS:   Class III INDICATIONS:Patient's personal history of colon polyps 2011, examination limited to to poor prep MEDICATIONS: MAC sedation, administered by CRNA and Propofol (Diprivan) 370 mg IV  DESCRIPTION OF PROCEDURE:   After the risks benefits and alternatives of the procedure were thoroughly explained, informed consent was obtained.  A digital rectal exam revealed no abnormalities of the rectum.   The LB [id number]  endoscope was introduced through the anus and advanced to the cecum, which was identified by both the appendix and ileocecal valve. No adverse events experienced.   Limited by poor preparation.   The quality of the prep was Suprep fair  The instrument was then slowly withdrawn as the colon was fully examined.      COLON FINDINGS: Two sessile polyps ranging between 3-31mm in size were found at the hepatic flexure.  A polypectomy was performed with a cold snare.  The resection was complete and the polyp tissue was partially retrieved.   There was mild scattered diverticulosis noted in the transverse colon, ascending colon, and at the cecum. Internal hemorrhoids were found.  Retroflexed views revealed no abnormalities. The time to cecum=9 minutes 10 seconds.  Withdrawal time=29.05 minutes 0 seconds.  The scope was withdrawn and  the procedure completed. COMPLICATIONS: There were no complications.  ENDOSCOPIC IMPRESSION: 1.   Two sessile polyps ranging between 3-65mm in size were found at the hepatic flexure; polypectomy was performed with a cold snare 2.   There was mild diverticulosis noted in the transverse colon, ascending colon, and at the cecum 3.   Internal hemorrhoids  Very difficult colonoscopy secondary to redundant colon and retained liquid stool.  RECOMMENDATIONS: If the polyp(s) removed today are proven to be adenomatous (pre-cancerous) polyps, you will need a repeat colonoscopy in 5 years.  Otherwise you should continue to follow colorectal cancer screening guidelines for "routine risk" patients with colonoscopy in 10 years.  You will receive a letter within 1-2 weeks with the results of your biopsy as well as final recommendations.  Please call my office if you have not received a letter after 3 weeks.  Patient will require an individualized prep at his next procedure   eSigned:  Inda Castle, MD 10/02/2013 10:42 AM   cc: Liston Alba, MD   PATIENT NAME:  [name] MR#: [id number]

## 2013-10-03 ENCOUNTER — Telehealth: Payer: Self-pay | Admitting: *Deleted

## 2013-10-03 NOTE — Telephone Encounter (Signed)
  Follow up Call-  Call back number 10/02/2013  Post procedure Call Back phone  # [phone]  Permission to leave phone message Yes     Patient questions:  Do you have a fever, pain , or abdominal swelling? no Pain Score  0 *  Have you tolerated food without any problems? yes  Have you been able to return to your normal activities? yes  Do you have any questions about your discharge instructions: Diet   no Medications  no Follow up visit  no  Do you have questions or concerns about your Care? no  Actions: * If pain score is 4 or above: No action needed, pain <4.

## 2013-10-08 ENCOUNTER — Encounter: Payer: Self-pay | Admitting: Gastroenterology

## 2013-10-09 ENCOUNTER — Encounter: Payer: Self-pay | Admitting: *Deleted

## 2013-10-22 ENCOUNTER — Ambulatory Visit: Payer: Self-pay | Admitting: Internal Medicine

## 2013-10-31 ENCOUNTER — Other Ambulatory Visit: Payer: Self-pay

## 2013-10-31 ENCOUNTER — Telehealth: Payer: Self-pay

## 2013-10-31 NOTE — Telephone Encounter (Signed)
Pt's med refill was verbally called in to his local Pharm this a.m.Refill on his metformin 500mg  90 tabs

## 2013-11-01 ENCOUNTER — Ambulatory Visit: Payer: Self-pay | Admitting: Internal Medicine

## 2013-11-05 ENCOUNTER — Ambulatory Visit (INDEPENDENT_AMBULATORY_CARE_PROVIDER_SITE_OTHER): Payer: Medicare HMO | Admitting: Family Medicine

## 2013-11-05 ENCOUNTER — Encounter: Payer: Self-pay | Admitting: Family Medicine

## 2013-11-05 VITALS — BP 137/91 | HR 60 | Temp 98.0°F | Resp 20 | Ht 69.0 in | Wt 284.0 lb

## 2013-11-05 DIAGNOSIS — I10 Essential (primary) hypertension: Secondary | ICD-10-CM

## 2013-11-05 DIAGNOSIS — Z7901 Long term (current) use of anticoagulants: Secondary | ICD-10-CM

## 2013-11-05 DIAGNOSIS — E785 Hyperlipidemia, unspecified: Secondary | ICD-10-CM

## 2013-11-05 DIAGNOSIS — E119 Type 2 diabetes mellitus without complications: Secondary | ICD-10-CM

## 2013-11-05 MED ORDER — WARFARIN SODIUM 5 MG PO TABS: 7.5000 mg | ORAL_TABLET | Freq: Every day | ORAL | Status: DC

## 2013-11-05 NOTE — Telephone Encounter (Signed)
See previous encounter

## 2013-11-05 NOTE — Progress Notes (Signed)
   Subjective:    Patient ID: [name], male    DOB: Jun 23, 1946, 68 y.o.   MRN: [id number]  HPI Pt here for follow up on chronic diseases. Maintains that he has been taking prescribed medications consistently.  Pt has not been compliant with his diet and states that he has not been exercising with the weather being so cold. He denies any palpitations or chest pains. Patient denies any swelling of extremities.   Pt had previously been on Coumadin secondary to a PE. Patient had hypercoagulable studies and a D-dimer in November 2014. He was taken off coumadin previously.    Review of Systems  Constitutional: Negative for chills and fatigue.  HENT: Negative.   Eyes: Negative.   Respiratory: Negative.   Cardiovascular: Negative.   Gastrointestinal: Negative.   Endocrine: Negative.   Genitourinary: Positive for frequency.  Musculoskeletal: Negative.   Skin: Negative.   Allergic/Immunologic: Negative.   Neurological: Negative.   Hematological: Negative.   Psychiatric/Behavioral: Negative.        Objective:   Physical Exam  Constitutional: He is oriented to person, place, and time. He appears well-developed and well-nourished.  HENT:  Head: Normocephalic.  Right Ear: External ear normal.  Eyes: Conjunctivae are normal. Pupils are equal, round, and reactive to light.  Neck: Normal range of motion. Neck supple.  Cardiovascular: Normal rate, regular rhythm and normal heart sounds.   Pulmonary/Chest: Effort normal and breath sounds normal.  Abdominal: Soft. Bowel sounds are normal.  Musculoskeletal: Normal range of motion.  Neurological: He is alert and oriented to person, place, and time. He has normal reflexes.  Skin: Skin is warm and dry.          Assessment & Plan:  1.  History of pulmonary embolus: Patient has a history of pulmonary embolus and was on Coumadin therapy previously which is being managed by Dr. Terrence Dupont. Will restart Coumadin therapy at 7.5 and check  INR in 3 days. D-Dimer and hypercoag panel were within normal limits on 07/25/2013.  Recent studies find that continued therapy is necessary. The risk for recurrence in patients with a first unprovoked VTE who have a negative D-dimer does not justify discontinuing chronic anticoagulation studies. Reviewed with Dr. Zigmund Daniel  2. Diabetes type 2: Patient states that he is consistent with medication regimen. However, the patient admits that is not adhering to a diabetic diet and his blood sugars have been anywhere from 1 1500. Has not been checking his blood sugars on regular basis. Will check hemoglobin A1c and urinalysis. Monofilament examination was within normal limits. Recommend that patient check feet daily at home.  His last eye examination was in January by  by Dr. Hetty Blend.  3. Hypertension: Blood pressure is stable on current medication regimen. Patient is on multiple medications for his blood pressure. Will check a complete metabolic panel and urinalysis.   4. Chronic diastolic heart failure:  He denies any pedal edema, PND, orthopnea shortness of breath or dyspnea on exertion. We'll continue current dose of Lasix without any changes. Patient states that he is scheduled with cardiologist, Dr. Terrence Dupont.    Labs: Complete metabolic, hemoglobin J6O,  PT/INR  Followup: Return to clinic 3 months with Dr. Zigmund Daniel

## 2013-11-07 ENCOUNTER — Other Ambulatory Visit (HOSPITAL_COMMUNITY): Payer: Self-pay | Admitting: *Deleted

## 2013-11-07 ENCOUNTER — Other Ambulatory Visit: Payer: Self-pay | Admitting: Internal Medicine

## 2013-11-09 ENCOUNTER — Telehealth: Payer: Self-pay

## 2013-11-09 ENCOUNTER — Other Ambulatory Visit: Payer: Self-pay | Admitting: Family Medicine

## 2013-11-09 ENCOUNTER — Other Ambulatory Visit: Payer: Medicare Other

## 2013-11-09 DIAGNOSIS — I10 Essential (primary) hypertension: Secondary | ICD-10-CM

## 2013-11-09 DIAGNOSIS — M25569 Pain in unspecified knee: Secondary | ICD-10-CM

## 2013-11-09 LAB — COMPLETE METABOLIC PANEL WITH GFR
ALT: 24 U/L (ref 0–53)
AST: 18 U/L (ref 0–37)
Albumin: 4.7 g/dL (ref 3.5–5.2)
Alkaline Phosphatase: 36 U/L — ABNORMAL LOW (ref 39–117)
BUN: 17 mg/dL (ref 6–23)
CO2: 28 mEq/L (ref 19–32)
Calcium: 9.5 mg/dL (ref 8.4–10.5)
Chloride: 103 mEq/L (ref 96–112)
Creat: 1.08 mg/dL (ref 0.50–1.35)
GFR, Est African American: 82 mL/min
GFR, Est Non African American: 71 mL/min
Glucose, Bld: 150 mg/dL — ABNORMAL HIGH (ref 70–99)
Potassium: 4.3 mEq/L (ref 3.5–5.3)
Sodium: 140 mEq/L (ref 135–145)
Total Bilirubin: 0.5 mg/dL (ref 0.2–1.2)
Total Protein: 7.5 g/dL (ref 6.0–8.3)

## 2013-11-09 LAB — PROTIME-INR
INR: 1.19 (ref <1.50)
Prothrombin Time: 15 seconds (ref 11.6–15.2)

## 2013-11-09 LAB — HEMOGLOBIN A1C
Hgb A1c MFr Bld: 7.5 % — ABNORMAL HIGH (ref <5.7)
Mean Plasma Glucose: 169 mg/dL — ABNORMAL HIGH (ref <117)

## 2013-11-09 MED ORDER — HYDROCODONE-ACETAMINOPHEN 10-325 MG PO TABS: 1.0000 | ORAL_TABLET | Freq: Four times a day (QID) | ORAL | Status: DC | PRN

## 2013-11-09 NOTE — Telephone Encounter (Signed)
11/09/2013 10:10 AM In Person (Incoming) Matthew Ochoa. (Self) Pt states he will need a refill of his [facility] Pain Med. Pt is aware of med pic up's on Tues&Fri's Only. Plz Advise.Thank You 11/09/2013 10:20 AM In Person (Incoming) Matthew Ochoa. (Self) Lab Tech stated she did not see any blood draw orders in sys this a.m. She did go ahead and draw his blood today.Could you place orders in sys.? Tech will take care of the rest. Thank you.

## 2013-11-09 NOTE — Telephone Encounter (Signed)
Hydrocodone-acetaminophen 10-325 mg every 6 hours as needed for moderate to severe right knee pain # 30

## 2013-11-10 LAB — URINALYSIS
Bilirubin Urine: NEGATIVE
Glucose, UA: NEGATIVE mg/dL
Hgb urine dipstick: NEGATIVE
Ketones, ur: NEGATIVE mg/dL
Nitrite: NEGATIVE
Protein, ur: NEGATIVE mg/dL
Specific Gravity, Urine: 1.023 (ref 1.005–1.030)
Urobilinogen, UA: 1 mg/dL (ref 0.0–1.0)
pH: 6.5 (ref 5.0–8.0)

## 2013-11-13 ENCOUNTER — Telehealth: Payer: Self-pay | Admitting: Internal Medicine

## 2013-11-13 ENCOUNTER — Telehealth (HOSPITAL_COMMUNITY): Payer: Self-pay | Admitting: Family Medicine

## 2013-11-13 DIAGNOSIS — Z7901 Long term (current) use of anticoagulants: Secondary | ICD-10-CM

## 2013-11-13 NOTE — Telephone Encounter (Signed)
Discussed laboratory results at length. Continue Coumadin 7.5 mg daily.  Patient to have PT/INR drawn on 11/16/2013.

## 2013-11-13 NOTE — Telephone Encounter (Signed)
Patient called regarding RX for pain. Advised patient RX is ready for pick up.

## 2013-11-16 ENCOUNTER — Other Ambulatory Visit: Payer: Medicare Other

## 2013-11-16 ENCOUNTER — Other Ambulatory Visit: Payer: Self-pay | Admitting: Family Medicine

## 2013-11-16 DIAGNOSIS — Z7901 Long term (current) use of anticoagulants: Secondary | ICD-10-CM

## 2013-11-17 LAB — PROTIME-INR
INR: 1.71 — ABNORMAL HIGH (ref <1.50)
Prothrombin Time: 19.7 seconds — ABNORMAL HIGH (ref 11.6–15.2)

## 2013-11-21 ENCOUNTER — Telehealth (HOSPITAL_COMMUNITY): Payer: Self-pay | Admitting: Family Medicine

## 2013-11-21 ENCOUNTER — Other Ambulatory Visit (HOSPITAL_COMMUNITY): Payer: Self-pay | Admitting: Family Medicine

## 2013-11-21 DIAGNOSIS — Z7901 Long term (current) use of anticoagulants: Secondary | ICD-10-CM

## 2013-11-21 MED ORDER — WARFARIN SODIUM 5 MG PO TABS: ORAL_TABLET | ORAL | Status: DC

## 2013-11-21 NOTE — Progress Notes (Signed)
Consulted with pharmacist at [facility] pertaining to weekly Coumadin dose. Suggested increasing weekly dosage by 15-20%. Will modify patient's Coumadin dose to 10 mg Monday, Wednesday, and Friday. Dose to remain at 7.5 mg Tuesday, Thursday, Saturday, and Sunday. Will recheck INR on 11/30/2013.

## 2013-11-21 NOTE — Telephone Encounter (Signed)
Patient informed that INR is 1.7. Patient understands that goal INR for anticoagulation therapy is between 2-3. Increase Coumadin dose to 10 mg Monday, Wednesday, and Friday. Patient to continue 7.5 mg Tuesday, Thursday, Saturday, and Sunday. Patient has a lab appointment to for INR check on 11/30/2013. Patient will follow up with Dr. Zigmund Daniel in 1 month.  Discussed at length and patient expressed understanding.

## 2013-11-30 ENCOUNTER — Other Ambulatory Visit: Payer: Medicare Other

## 2013-11-30 ENCOUNTER — Other Ambulatory Visit: Payer: Self-pay | Admitting: Internal Medicine

## 2013-11-30 DIAGNOSIS — Z7901 Long term (current) use of anticoagulants: Secondary | ICD-10-CM

## 2013-12-01 LAB — PROTIME-INR
INR: 2.99 — ABNORMAL HIGH (ref <1.50)
Prothrombin Time: 30.2 seconds — ABNORMAL HIGH (ref 11.6–15.2)

## 2013-12-01 LAB — APTT: aPTT: 41 seconds — ABNORMAL HIGH (ref 24–37)

## 2013-12-05 ENCOUNTER — Telehealth: Payer: Self-pay

## 2013-12-05 DIAGNOSIS — Z7901 Long term (current) use of anticoagulants: Secondary | ICD-10-CM

## 2013-12-05 NOTE — Telephone Encounter (Signed)
Please address

## 2013-12-06 NOTE — Telephone Encounter (Signed)
Current laboratory values discussed with patient. Will stay on current Warfarin dosage M, W, F at 10 mg, and T, Th, S, Sun, 7.5 mg. Patient maintains that he is taking medications as prescribed and to have INR drawn on 12/14/2013 in office.

## 2013-12-14 ENCOUNTER — Other Ambulatory Visit: Payer: Self-pay | Admitting: Family Medicine

## 2013-12-14 ENCOUNTER — Other Ambulatory Visit: Payer: Medicare Other

## 2013-12-14 DIAGNOSIS — Z7901 Long term (current) use of anticoagulants: Secondary | ICD-10-CM

## 2013-12-14 LAB — PROTIME-INR
INR: 2.64 — ABNORMAL HIGH (ref <1.50)
Prothrombin Time: 27.5 seconds — ABNORMAL HIGH (ref 11.6–15.2)

## 2013-12-25 ENCOUNTER — Telehealth: Payer: Self-pay

## 2013-12-25 DIAGNOSIS — Z7901 Long term (current) use of anticoagulants: Secondary | ICD-10-CM

## 2013-12-26 NOTE — Telephone Encounter (Signed)
Reviewed recent PT/INR. Patient therapeutic on current dosage. Patient will return for follow up  PT/INR on Friday, 01/04/2014

## 2014-01-04 ENCOUNTER — Other Ambulatory Visit: Payer: Self-pay | Admitting: Internal Medicine

## 2014-01-04 ENCOUNTER — Other Ambulatory Visit: Payer: Self-pay

## 2014-01-04 ENCOUNTER — Other Ambulatory Visit: Payer: Medicare Other

## 2014-01-04 DIAGNOSIS — Z7901 Long term (current) use of anticoagulants: Secondary | ICD-10-CM

## 2014-01-05 ENCOUNTER — Other Ambulatory Visit: Payer: Self-pay | Admitting: Internal Medicine

## 2014-01-05 LAB — PROTIME-INR
INR: 3.34 — ABNORMAL HIGH (ref <1.50)
Prothrombin Time: 32.9 seconds — ABNORMAL HIGH (ref 11.6–15.2)

## 2014-01-08 ENCOUNTER — Telehealth: Payer: Self-pay | Admitting: Family Medicine

## 2014-01-08 DIAGNOSIS — Z7901 Long term (current) use of anticoagulants: Secondary | ICD-10-CM

## 2014-01-08 NOTE — Telephone Encounter (Signed)
Consulted with Jaclyn Shaggy, pharmacist at [facility] Coumad[facility] clinic pertaining to how often INR should be checked with patients that are restarting coumadin therapy. Reported that initially you can bring patients in as often as every 2-3 days  when starting Coumadin. If patients have 3 consecutive therapeutic INRs, can extend to 2 weeks.Some patient's are seen every 4-6 weeks when INRs are consecutively therapeutic.  Patient's INR is currently 3.34, patient to hold coumadin and have INR drawn on 01/09/2014 at 9am. Patient expressed understanding.   Patient also requesting diabetic education. Will refer and notify patient with appt time.

## 2014-01-09 ENCOUNTER — Other Ambulatory Visit: Payer: Medicare Other

## 2014-01-09 ENCOUNTER — Other Ambulatory Visit: Payer: Self-pay | Admitting: Internal Medicine

## 2014-01-09 DIAGNOSIS — Z7901 Long term (current) use of anticoagulants: Secondary | ICD-10-CM

## 2014-01-09 LAB — PROTIME-INR
INR: 2.99 — ABNORMAL HIGH (ref <1.50)
Prothrombin Time: 30.2 seconds — ABNORMAL HIGH (ref 11.6–15.2)

## 2014-01-09 LAB — APTT: aPTT: 43 seconds — ABNORMAL HIGH (ref 24–37)

## 2014-01-10 ENCOUNTER — Other Ambulatory Visit: Payer: Self-pay

## 2014-01-10 ENCOUNTER — Telehealth: Payer: Self-pay | Admitting: Family Medicine

## 2014-01-10 DIAGNOSIS — Z7901 Long term (current) use of anticoagulants: Secondary | ICD-10-CM

## 2014-01-10 MED ORDER — WARFARIN SODIUM 5 MG PO TABS: 7.5000 mg | ORAL_TABLET | Freq: Once | ORAL | Status: DC

## 2014-01-10 NOTE — Telephone Encounter (Signed)
Consulted with Matthew Ochoa, pharmacist, pertaining to patient's current Coumadin dose. Suggested reducing current dose to 7.5 mg daily.  Will reduce patient's coumadin dose by 10%, which is 52.5 mg weekly. Patient will return to clinic in 2 weeks to repeat INR.

## 2014-01-17 ENCOUNTER — Other Ambulatory Visit: Payer: Self-pay | Admitting: Internal Medicine

## 2014-01-18 ENCOUNTER — Other Ambulatory Visit: Payer: Self-pay | Admitting: Family Medicine

## 2014-01-18 ENCOUNTER — Telehealth: Payer: Self-pay | Admitting: Internal Medicine

## 2014-01-18 ENCOUNTER — Telehealth: Payer: Self-pay | Admitting: Family Medicine

## 2014-01-18 DIAGNOSIS — Z7901 Long term (current) use of anticoagulants: Secondary | ICD-10-CM

## 2014-01-18 DIAGNOSIS — M25569 Pain in unspecified knee: Secondary | ICD-10-CM

## 2014-01-18 LAB — PROTIME-INR
INR: 2.02 — ABNORMAL HIGH (ref <1.50)
Prothrombin Time: 22.4 seconds — ABNORMAL HIGH (ref 11.6–15.2)

## 2014-01-18 NOTE — Telephone Encounter (Signed)
Notified patient of INR results, to remain on Coumadin 7.5 mg daily. Patient will have INR drawn on 01/18/2014

## 2014-01-18 NOTE — Telephone Encounter (Signed)
Notified patient of INR results, patient

## 2014-01-21 ENCOUNTER — Telehealth: Payer: Self-pay | Admitting: Internal Medicine

## 2014-01-21 MED ORDER — HYDROCODONE-ACETAMINOPHEN 10-325 MG PO TABS: 1.0000 | ORAL_TABLET | Freq: Four times a day (QID) | ORAL | Status: DC | PRN

## 2014-01-21 NOTE — Telephone Encounter (Signed)
Re-ordered Hydrocodone-acetaminophen 10-325 every 6 hours as needed for severe pain.   Reviewed [place] Substance Reporting system prior to reorder

## 2014-01-21 NOTE — Telephone Encounter (Signed)
Medication was reordered already.

## 2014-01-24 ENCOUNTER — Telehealth (HOSPITAL_COMMUNITY): Payer: Self-pay | Admitting: Family Medicine

## 2014-01-24 DIAGNOSIS — Z7901 Long term (current) use of anticoagulants: Secondary | ICD-10-CM

## 2014-01-24 LAB — PROTIME-INR
INR: 2.12 — ABNORMAL HIGH (ref <1.50)
Prothrombin Time: 22.9 seconds — ABNORMAL HIGH (ref 11.6–15.2)

## 2014-01-24 NOTE — Telephone Encounter (Signed)
Notified with INR result, will remain on current Coumadin dose. Patient to follow up for labs on 02/21/2014

## 2014-01-30 ENCOUNTER — Telehealth: Payer: Self-pay

## 2014-01-30 ENCOUNTER — Telehealth: Payer: Self-pay | Admitting: Internal Medicine

## 2014-01-30 NOTE — Telephone Encounter (Signed)
Call Documentation     Glori Bickers at 01/30/2014 4:11 PM     Status: Signed        Patient called to find out why he was put back on blood thinners.                  Encounter MyChart Messages

## 2014-01-30 NOTE — Telephone Encounter (Signed)
Patient called to find out why he was put back on blood thinners.

## 2014-02-06 ENCOUNTER — Ambulatory Visit: Payer: Medicare HMO | Admitting: Family Medicine

## 2014-02-07 ENCOUNTER — Other Ambulatory Visit: Payer: Self-pay | Admitting: Internal Medicine

## 2014-02-07 ENCOUNTER — Ambulatory Visit (INDEPENDENT_AMBULATORY_CARE_PROVIDER_SITE_OTHER): Payer: Medicare HMO | Admitting: Internal Medicine

## 2014-02-07 ENCOUNTER — Encounter: Payer: Self-pay | Admitting: Internal Medicine

## 2014-02-07 VITALS — BP 135/70 | HR 50 | Temp 98.2°F

## 2014-02-07 DIAGNOSIS — I5032 Chronic diastolic (congestive) heart failure: Secondary | ICD-10-CM

## 2014-02-07 DIAGNOSIS — E1165 Type 2 diabetes mellitus with hyperglycemia: Secondary | ICD-10-CM

## 2014-02-07 DIAGNOSIS — Z7901 Long term (current) use of anticoagulants: Secondary | ICD-10-CM

## 2014-02-07 DIAGNOSIS — L600 Ingrowing nail: Secondary | ICD-10-CM

## 2014-02-07 DIAGNOSIS — I10 Essential (primary) hypertension: Secondary | ICD-10-CM

## 2014-02-07 DIAGNOSIS — I471 Supraventricular tachycardia: Secondary | ICD-10-CM

## 2014-02-07 DIAGNOSIS — Z23 Encounter for immunization: Secondary | ICD-10-CM

## 2014-02-07 DIAGNOSIS — E119 Type 2 diabetes mellitus without complications: Secondary | ICD-10-CM

## 2014-02-07 DIAGNOSIS — I517 Cardiomegaly: Secondary | ICD-10-CM

## 2014-02-07 DIAGNOSIS — I4719 Other supraventricular tachycardia: Secondary | ICD-10-CM

## 2014-02-07 DIAGNOSIS — E1100 Type 2 diabetes mellitus with hyperosmolarity without nonketotic hyperglycemic-hyperosmolar coma (NKHHC): Secondary | ICD-10-CM

## 2014-02-07 DIAGNOSIS — T887XXA Unspecified adverse effect of drug or medicament, initial encounter: Secondary | ICD-10-CM

## 2014-02-07 DIAGNOSIS — Z0000 Encounter for general adult medical examination without abnormal findings: Secondary | ICD-10-CM

## 2014-02-07 MED ORDER — LISINOPRIL 40 MG PO TABS: 40.0000 mg | ORAL_TABLET | Freq: Every day | ORAL | Status: DC

## 2014-02-07 NOTE — Progress Notes (Signed)
Subjective:    Patient ID: Matthew Labrador., male    DOB: [birth year], 67 y.o.   MRN: [id number]  HPI: Pt here for annual complete physical examination. He has no c/o except that he is requestign a change in medication from Benicar to a less expensive substitute. He also in requesting a drug holiday from potassium supplementation.   He has had no symptoms of dizziness, CP, polyuria, polydipsia, paresthesias, weakness or symptoms of hypoglycemia. He has been active and has been playing golf on a regular basis for the last several months. He denies any G-U or GI problems.     Review of Systems  Constitutional: Negative.  Negative for activity change, appetite change and unexpected weight change.  HENT: Negative.   Eyes: Negative.   Respiratory: Negative.  Negative for apnea, cough, chest tightness, shortness of breath and wheezing.   Cardiovascular: Negative.  Negative for chest pain and palpitations.  Gastrointestinal: Negative.  Negative for nausea, diarrhea, constipation, abdominal distention and rectal pain.  Endocrine: Negative.  Negative for cold intolerance, heat intolerance, polydipsia, polyphagia and polyuria.  Genitourinary: Negative.  Negative for dysuria, urgency, discharge, scrotal swelling, enuresis, difficulty urinating and testicular pain.  Skin: Negative.  Negative for color change.  Allergic/Immunologic: Negative.   Neurological: Negative.  Negative for dizziness, syncope, weakness and headaches.  Hematological: Negative.   Psychiatric/Behavioral: Negative.        Objective:   Physical Exam  Vitals reviewed. Constitutional: He is oriented to person, place, and time. He appears well-developed and well-nourished.  HENT:  Head: Atraumatic.  Eyes: Conjunctivae and EOM are normal. Pupils are equal, round, and reactive to light. No scleral icterus.  Vision 20/30 both eyes  Neck: Normal range of motion. Neck supple.  Cardiovascular: Normal rate and regular rhythm.  Exam  reveals no gallop and no friction rub.   No murmur heard. Pulmonary/Chest: Effort normal and breath sounds normal. He has no wheezes. He has no rales. He exhibits no tenderness.  Abdominal: Soft. Bowel sounds are normal. He exhibits no mass.  Genitourinary:  Pt refused testicular examination  Musculoskeletal: Normal range of motion.  Neurological: He is alert and oriented to person, place, and time.  Skin: Skin is warm and dry.  Monofilament examination normal  Psychiatric: He has a normal mood and affect. His behavior is normal. Judgment and thought content normal.   BP 135/70  Pulse 50  Temp(Src) 98.2 F (36.8 C) (Oral)  ECHO: 07/06/2012 Transthoracic Echocardiography  Patient: Matthew Ochoa, Matthew Ochoa MR #: [id number] Study Date: 07/05/2012 Gender: M Age: 45 Height: 152.4cm Weight: 121.4kg BSA: 2.19m^2 Pt. Status: Room: 2004  PERFORMING Charolette Forward, MD ADMITTING Theressa Millard ATTENDING Hosie Poisson SONOGRAPHER Leavy Cella Josephine Cables cc:  ------------------------------------------------------------ LV EF: 50% - 55%  ------------------------------------------------------------ Indications: Shortness of breath 786.05.  ------------------------------------------------------------ History: PMH: Former smoker Syncope. Atrial fibrillation. Coronary artery disease. Chronic obstructive pulmonary disease. Risk factors: Hypertension. Diabetes mellitus. Dyslipidemia.  ------------------------------------------------------------ Study Conclusions  - Left ventricle: The cavity size was normal. Wall thickness was increased in a pattern of mild LVH. Systolic function was normal. The estimated ejection fraction was in the range of 50% to 55%. Regional wall motion abnormalities cannot be excluded. Doppler parameters are consistent with abnormal left ventricular relaxation (grade 1 diastolic dysfunction). - Aortic valve: Trivial regurgitation. - Left atrium:  The atrium was moderately dilated. - Atrial septum: No defect or patent foramen ovale was identified. Transthoracic echocardiography. M-mode, complete 2D, spectral Doppler, and color Doppler. Height: Height: 152.4cm.  Height: 60in. Weight: Weight: 121.4kg. Weight: 267lb. Body mass index: BMI: 52.3kg/m^2. Body surface area: BSA: 2.37m^2. Blood pressure: 148/88. Patient status: Inpatient. Location: Bedside.  ------------------------------------------------------------  ------------------------------------------------------------ Left ventricle: The cavity size was normal. Wall thickness was increased in a pattern of mild LVH. Systolic function was normal. The estimated ejection fraction was in the range of 50% to 55%. Regional wall motion abnormalities cannot be excluded. Doppler parameters are consistent with abnormal left ventricular relaxation (grade 1 diastolic dysfunction).  ------------------------------------------------------------ Aortic valve: Structurally normal valve. Trileaflet. Cusp separation was normal. Doppler: Transvalvular velocity was within the normal range. There was no stenosis. Trivial regurgitation.  ------------------------------------------------------------ Mitral valve: Structurally normal valve. Leaflet separation was normal. Doppler: Transvalvular velocity was within the normal range. There was no evidence for stenosis. Trivial regurgitation. Peak gradient: 85mm Hg (D).  ------------------------------------------------------------ Left atrium: The atrium was moderately dilated.  ------------------------------------------------------------ Atrial septum: No defect or patent foramen ovale was identified.  ------------------------------------------------------------ Right ventricle: The cavity size was normal. Wall thickness was normal. Systolic function was normal.  ------------------------------------------------------------ Pulmonic valve: Poorly  visualized. Doppler: No significant regurgitation.  ------------------------------------------------------------ Tricuspid valve: Poorly visualized. Doppler: No significant regurgitation.  ------------------------------------------------------------ Right atrium: The atrium was normal in size.  ------------------------------------------------------------ Pericardium: There was no pericardial effusion.  ------------------------------------------------------------   ------------------------------------------------------------ Prepared and Electronically Authenticated by  Charolette Forward, MD [id number]       Assessment & Plan:  1. HTN: Well Controlled. Pt expresses that Benicar is cost prohibitive and is requesting a change in medication. We have discussed several medications. He has NKDA and no known adverse effects to medications. Will change to Lisinopril 40 mg which is on formulary for his insurance. Check BP in 1 month when electrolytes checked.  2. DM II: He has been doing well with his DM. Last Hb A1c was at 7.5. He has had no episodes of hypoglycemia. Will continue current dose of Metformin. Check Hb A1c today. Also check BMET for electrolytes and renal function and U/A for evidence of proteinuria. Needs yearly vision examination.  - Microfilament examination normal but patient noted to have hypertropic nails and possible ingrown toenails. Will refer to Podiatry  3. Obesity: Pt counseled about weight loss. He does not want to go on a weight loss plan or be referred to nutrition services at this time.  4. Arthritis: Pt states knees doing okay. He has been playing golf this season and has had no problems.  5. Atrial Tachycardia: Pt on Amiodarone and under care of Dr. Terrence Dupont. We have requested office records to confirm/support diagnosis as the diagnosis pre-dated the patient's care under this Physician and from discussion with Dr. Terrence Dupont, the patient did not have  A-fib but rather atrial tachycardia. However we have not yet received any records to support/confirm diagnosis.  6. Chronic Anticoagulation:  Secondary to PE. Pt on chronic anticoagulation for pulmonary embolus. Had long discussion about why he needs to be on lifelong anticoagulation. Pt does not want to modify diet but has been therapeutic. Will discuss other options Xarelto, Pradaxa as an option.  7. Hypokalemia: Pt states that he has been eating foods high in potassium and wants to have a drug holiday from supplements.  8. OSA: On CPAP and under care Dr. Baird Lyons- Pulmonology.  9. Chronic Diastolic HF: Will obtain ECHO. Currently on ACE-I.  10 Annual Examination:     - Check lipids: pt is on current statin therapy. Will check to ensure that lipids still controlled, in light  of known PAD.    - Immunization: Pneumococcal vaccine given today. Pt does not want to have Zostavax today.    - EKG: Sinus bradycardia with  atrial enlargement (Pt with h/o atrial enlargement). Will request                ECHO records from Dr. Terrence Dupont.     -  Next Colonoscopy due in 2025.    - CMET for electrolytes, serum albumin, liver enzymes, renal function    - CBC with diff to evaluate for anemia, platelets and WBC.   Next annual examination: 02/2015.   RTC: 3 months  Labs: CMET, CBC with diff, Lipids, Hb A1c. Magnesium, U/A.  RTC: 1 month for BMET to check potassium levels

## 2014-02-08 ENCOUNTER — Other Ambulatory Visit: Payer: Self-pay | Admitting: Internal Medicine

## 2014-02-11 ENCOUNTER — Other Ambulatory Visit: Payer: Self-pay | Admitting: Internal Medicine

## 2014-02-11 LAB — BASIC METABOLIC PANEL
BUN: 14 mg/dL (ref 6–23)
CO2: 24 mEq/L (ref 19–32)
Calcium: 8.9 mg/dL (ref 8.4–10.5)
Chloride: 106 mEq/L (ref 96–112)
Creat: 0.96 mg/dL (ref 0.50–1.35)
Glucose, Bld: 142 mg/dL — ABNORMAL HIGH (ref 70–99)
Potassium: 3.9 mEq/L (ref 3.5–5.3)
Sodium: 139 mEq/L (ref 135–145)

## 2014-02-11 LAB — LIPID PANEL
Cholesterol: 103 mg/dL (ref 0–200)
HDL: 23 mg/dL — ABNORMAL LOW (ref >39)
LDL Cholesterol: 58 mg/dL (ref 0–99)
Total CHOL/HDL Ratio: 4.5 Ratio
Triglycerides: 112 mg/dL (ref <150)
VLDL: 22 mg/dL (ref 0–40)

## 2014-02-11 LAB — HEMOGLOBIN A1C
Hgb A1c MFr Bld: 6.8 % — ABNORMAL HIGH (ref <5.7)
Mean Plasma Glucose: 148 mg/dL — ABNORMAL HIGH (ref <117)

## 2014-02-11 LAB — MAGNESIUM: Magnesium: 1.8 mg/dL (ref 1.5–2.5)

## 2014-02-12 ENCOUNTER — Other Ambulatory Visit: Payer: Self-pay

## 2014-02-12 ENCOUNTER — Telehealth: Payer: Self-pay

## 2014-02-12 NOTE — Telephone Encounter (Signed)
Pt's Benicar was changed to Lisinopril  40 mgat his request and the prescription has been electronically sent to his Pharmacy since 02/07/2014.

## 2014-02-12 NOTE — Telephone Encounter (Signed)
Pt contacted office to find out about the Benicor 40 medication that you and he discussed on his last visit.Pt wants to know will there be a Rx sent in for him or not. Plz  Advise

## 2014-02-13 ENCOUNTER — Telehealth: Payer: Self-pay

## 2014-02-13 NOTE — Telephone Encounter (Signed)
Pt was contacted and told of his New Med being  sent over to his Pharm of request back on 02/07/2014.Pt was advised to ck w/ pharm. As it's ready to be picked up.

## 2014-02-18 NOTE — Progress Notes (Signed)
Quick Note:  Labs reviewed. No changes to plan of care. ______

## 2014-03-13 ENCOUNTER — Other Ambulatory Visit: Payer: Self-pay | Admitting: Internal Medicine

## 2014-03-27 ENCOUNTER — Telehealth: Payer: Self-pay | Admitting: Family Medicine

## 2014-03-27 ENCOUNTER — Telehealth: Payer: Self-pay | Admitting: Internal Medicine

## 2014-03-27 DIAGNOSIS — M25561 Pain in right knee: Secondary | ICD-10-CM

## 2014-03-27 DIAGNOSIS — Z7901 Long term (current) use of anticoagulants: Secondary | ICD-10-CM

## 2014-03-27 MED ORDER — HYDROCODONE-ACETAMINOPHEN 10-325 MG PO TABS: 1.0000 | ORAL_TABLET | Freq: Four times a day (QID) | ORAL | Status: DC | PRN

## 2014-03-27 NOTE — Telephone Encounter (Signed)
Prescription filled for Norco 10-325 mg #30 tabs.

## 2014-03-27 NOTE — Telephone Encounter (Signed)
Matthew Ochoa inquiring about INR. He has not had INR checked since 01/18/2014. Patient was therapeutic at that time. Patient discussed starting Xarelto with Dr. Zigmund Daniel on 02/07/2014. He states that he cannot afford medication, so he will remain on Warfarin. Will order PT/INR and adjust Warfarin accordingly.

## 2014-03-27 NOTE — Telephone Encounter (Signed)
Refill request for HYDROcodone-acetaminophen (NORCO) 10-325 MG per tablet / LOV 02/07/2014

## 2014-03-28 ENCOUNTER — Telehealth: Payer: Self-pay | Admitting: Internal Medicine

## 2014-03-28 ENCOUNTER — Encounter (HOSPITAL_COMMUNITY): Payer: Self-pay | Admitting: Emergency Medicine

## 2014-03-28 ENCOUNTER — Emergency Department (INDEPENDENT_AMBULATORY_CARE_PROVIDER_SITE_OTHER)
Admission: EM | Admit: 2014-03-28 | Discharge: 2014-03-28 | Disposition: A | Discharge status: Home / Self Care | Payer: Medicare HMO | Source: Home / Self Care | Attending: Emergency Medicine | Admitting: Emergency Medicine

## 2014-03-28 DIAGNOSIS — E860 Dehydration: Secondary | ICD-10-CM

## 2014-03-28 DIAGNOSIS — N390 Urinary tract infection, site not specified: Secondary | ICD-10-CM

## 2014-03-28 LAB — POCT URINALYSIS DIP (DEVICE)
Glucose, UA: NEGATIVE mg/dL
Ketones, ur: NEGATIVE mg/dL
Nitrite: POSITIVE — AB
Protein, ur: NEGATIVE mg/dL
Specific Gravity, Urine: >=1.03 (ref 1.005–1.030)
Urobilinogen, UA: 1 mg/dL (ref 0.0–1.0)
pH: 5.5 (ref 5.0–8.0)

## 2014-03-28 LAB — POCT I-STAT, CHEM 8
BUN: 18 mg/dL (ref 6–23)
Calcium, Ion: 1.14 mmol/L (ref 1.13–1.30)
Chloride: 106 mEq/L (ref 96–112)
Creatinine, Ser: 1.3 mg/dL (ref 0.50–1.35)
Glucose, Bld: 134 mg/dL — ABNORMAL HIGH (ref 70–99)
HCT: 40 % (ref 39.0–52.0)
Hemoglobin: 13.6 g/dL (ref 13.0–17.0)
Potassium: 3.8 mEq/L (ref 3.7–5.3)
Sodium: 139 mEq/L (ref 137–147)
TCO2: 22 mmol/L (ref 0–100)

## 2014-03-28 LAB — PROTIME-INR
INR: 2 — ABNORMAL HIGH (ref 0.00–1.49)
Prothrombin Time: 22.7 seconds — ABNORMAL HIGH (ref 11.6–15.2)

## 2014-03-28 MED ORDER — CEPHALEXIN 500 MG PO CAPS: 500.0000 mg | ORAL_CAPSULE | Freq: Four times a day (QID) | ORAL | Status: DC

## 2014-03-28 NOTE — Discharge Instructions (Signed)
Dehydration, Adult °Dehydration is when you lose more fluids from the body than you take in. Vital organs like the kidneys, brain, and heart cannot function without a proper amount of fluids and salt. Any loss of fluids from the body can cause dehydration.  °CAUSES  °· Vomiting. °· Diarrhea. °· Excessive sweating. °· Excessive urine output. °· Fever. °SYMPTOMS  °Mild dehydration °· Thirst. °· Dry lips. °· Slightly dry mouth. °Moderate dehydration °· Very dry mouth. °· Sunken eyes. °· Skin does not bounce back quickly when lightly pinched and released. °· Dark urine and decreased urine production. °· Decreased tear production. °· Headache. °Severe dehydration °· Very dry mouth. °· Extreme thirst. °· Rapid, weak pulse (more than 100 beats per minute at rest). °· Cold hands and feet. °· Not able to sweat in spite of heat and temperature. °· Rapid breathing. °· Blue lips. °· Confusion and lethargy. °· Difficulty being awakened. °· Minimal urine production. °· No tears. °DIAGNOSIS  °Your caregiver will diagnose dehydration based on your symptoms and your exam. Blood and urine tests will help confirm the diagnosis. The diagnostic evaluation should also identify the cause of dehydration. °TREATMENT  °Treatment of mild or moderate dehydration can often be done at home by increasing the amount of fluids that you drink. It is best to drink small amounts of fluid more often. Drinking too much at one time can make vomiting worse. Refer to the home care instructions below. °Severe dehydration needs to be treated at the hospital where you will probably be given intravenous (IV) fluids that contain water and electrolytes. °HOME CARE INSTRUCTIONS  °· Ask your caregiver about specific rehydration instructions. °· Drink enough fluids to keep your urine clear or pale yellow. °· Drink small amounts frequently if you have nausea and vomiting. °· Eat as you normally do. °· Avoid: °¨ Foods or drinks high in sugar. °¨ Carbonated  drinks. °¨ Juice. °¨ Extremely hot or cold fluids. °¨ Drinks with caffeine. °¨ Fatty, greasy foods. °¨ Alcohol. °¨ Tobacco. °¨ Overeating. °¨ Gelatin desserts. °· Wash your hands well to avoid spreading bacteria and viruses. °· Only take over-the-counter or prescription medicines for pain, discomfort, or fever as directed by your caregiver. °· Ask your caregiver if you should continue all prescribed and over-the-counter medicines. °· Keep all follow-up appointments with your caregiver. °SEEK MEDICAL CARE IF: °· You have abdominal pain and it increases or stays in one area (localizes). °· You have a rash, stiff neck, or severe headache. °· You are irritable, sleepy, or difficult to awaken. °· You are weak, dizzy, or extremely thirsty. °SEEK IMMEDIATE MEDICAL CARE IF:  °· You are unable to keep fluids down or you get worse despite treatment. °· You have frequent episodes of vomiting or diarrhea. °· You have blood or green matter (bile) in your vomit. °· You have blood in your stool or your stool looks black and tarry. °· You have not urinated in 6 to 8 hours, or you have only urinated a small amount of very dark urine. °· You have a fever. °· You faint. °MAKE SURE YOU:  °· Understand these instructions. °· Will watch your condition. °· Will get help right away if you are not doing well or get worse. °Document Released: 08/23/2005 Document Revised: 11/15/2011 Document Reviewed: 04/12/2011 °[employer]® Patient Information ©2015 [employer]. This information is not intended to replace advice given to you by your health care provider. Make sure you discuss any questions you have with your health care   provider.  Urinary Tract Infection Urinary tract infections (UTIs) can develop anywhere along your urinary tract. Your urinary tract is your body's drainage system for removing wastes and extra water. Your urinary tract includes two kidneys, two ureters, a bladder, and a urethra. Your kidneys are a pair of bean-shaped  organs. Each kidney is about the size of your fist. They are located below your ribs, one on each side of your spine. CAUSES Infections are caused by microbes, which are microscopic organisms, including fungi, viruses, and bacteria. These organisms are so small that they can only be seen through a microscope. Bacteria are the microbes that most commonly cause UTIs. SYMPTOMS  Symptoms of UTIs may vary by age and gender of the patient and by the location of the infection. Symptoms in young women typically include a frequent and intense urge to urinate and a painful, burning feeling in the bladder or urethra during urination. Older women and men are more likely to be tired, shaky, and weak and have muscle aches and abdominal pain. A fever may mean the infection is in your kidneys. Other symptoms of a kidney infection include pain in your back or sides below the ribs, nausea, and vomiting. DIAGNOSIS To diagnose a UTI, your caregiver will ask you about your symptoms. Your caregiver also will ask to provide a urine sample. The urine sample will be tested for bacteria and white blood cells. White blood cells are made by your body to help fight infection. TREATMENT  Typically, UTIs can be treated with medication. Because most UTIs are caused by a bacterial infection, they usually can be treated with the use of antibiotics. The choice of antibiotic and length of treatment depend on your symptoms and the type of bacteria causing your infection. HOME CARE INSTRUCTIONS  If you were prescribed antibiotics, take them exactly as your caregiver instructs you. Finish the medication even if you feel better after you have only taken some of the medication.  Drink enough water and fluids to keep your urine clear or pale yellow.  Avoid caffeine, tea, and carbonated beverages. They tend to irritate your bladder.  Empty your bladder often. Avoid holding urine for long periods of time.  Empty your bladder before and  after sexual intercourse.  After a bowel movement, women should cleanse from front to back. Use each tissue only once. SEEK MEDICAL CARE IF:   You have back pain.  You develop a fever.  Your symptoms do not begin to resolve within 3 days. SEEK IMMEDIATE MEDICAL CARE IF:   You have severe back pain or lower abdominal pain.  You develop chills.  You have nausea or vomiting.  You have continued burning or discomfort with urination. MAKE SURE YOU:   Understand these instructions.  Will watch your condition.  Will get help right away if you are not doing well or get worse. Document Released: 06/02/2005 Document Revised: 02/22/2012 Document Reviewed: 10/01/2011 [facility] Patient Information 2015 [place], [place]. This information is not intended to replace advice given to you by your health care provider. Make sure you discuss any questions you have with your health care provider.

## 2014-03-28 NOTE — ED Provider Notes (Addendum)
CSN: [id number]     Arrival date & time 03/28/14  1003 History   First MD Initiated Contact with Patient 03/28/14 1045     Chief Complaint  Patient presents with  . Dizziness   (Consider location/radiation/quality/duration/timing/severity/associated sxs/prior Treatment) HPI Comments: Reports mild dizziness with generalized malaise that he work with this morning. Denies polyuria but states, "I am always thirsty." States he has not checked his blood glucose in last few days. Due to have INR checked tomorrow Denies symptoms at time of history and exam.   Patient is a 68 y.o. male presenting with dizziness. The history is provided by the patient.  Dizziness Quality:  Unable to specify Severity:  Mild Onset quality:  Gradual Duration: had single episode while working on his screened in porch this morning. [employer]. Progression:  Resolved Chronicity:  New Context: bending over and standing up   Context: not with loss of consciousness   Associated symptoms: no blood in stool, no chest pain, no diarrhea, no headaches, no hearing loss, no nausea, no palpitations, no shortness of breath, no syncope, no tinnitus, no vision changes, no vomiting and no weakness     Past Medical History  Diagnosis Date  . Sleep apnea     NPSG 03-17-98, cpap 14  . Type 2 diabetes mellitus   . Hypertension   . COPD (chronic obstructive pulmonary disease)   . HTN (hypertension)   . Allergic rhinitis   . Abdominal pain, right upper quadrant   . Calculus of gallbladder without mention of cholecystitis or obstruction   . Benign localized hyperplasia of prostate with urinary obstruction and other lower urinary tract symptoms (LUTS)(600.21)   . Hematuria, unspecified   . Diabetes mellitus   . Hyperlipidemia   . Asthma     as child  . Atrial enlargement, left y-2   Past Surgical History  Procedure Laterality Date  . Angioplasty      stent  . Coronary angioplasty with stent placement     . Colonoscopy    . Polypectomy     Family History  Problem Relation Age of Onset  . Asthma Sister   . Lung cancer Father   . Breast cancer Sister   . Colon cancer Neg Hx   . Rectal cancer Neg Hx   . Stomach cancer Neg Hx    History  Substance Use Topics  . Smoking status: Former Smoker -- 1.00 packs/day for 30 years    Types: Cigarettes    Quit date: 04/13/2011  . Smokeless tobacco: Never Used  . Alcohol Use: Yes     Comment: Socially    Review of Systems  Constitutional: Negative.   HENT: Negative.  Negative for hearing loss and tinnitus.   Eyes: Negative.   Respiratory: Negative for cough, chest tightness and shortness of breath.   Cardiovascular: Negative for chest pain, palpitations, leg swelling and syncope.  Gastrointestinal: Negative for nausea, vomiting, diarrhea and blood in stool.       No melena  Genitourinary: Negative.   Musculoskeletal: Negative.   Skin: Negative.   Neurological: Positive for dizziness. Negative for tremors, seizures, syncope, speech difficulty, weakness and headaches.  Hematological: Negative.   Psychiatric/Behavioral: Negative.     Allergies  Review of patient's allergies indicates no known allergies.  Home Medications   Prior to Admission medications   Medication Sig Start Date End Date Taking? Authorizing Provider  amiodarone (PACERONE) 100 MG tablet Take 100 mg by mouth. 1/2 tablet everyday 07/07/12  Hosie Poisson, MD  amLODipine (NORVASC) 5 MG tablet Take 1 tablet (5 mg total) by mouth daily. 04/23/13 04/23/14  Kerin Perna, NP  aspirin 81 MG tablet Take 81 mg by mouth daily.      Historical Provider, MD  atorvastatin (LIPITOR) 40 MG tablet Take 40 mg by mouth daily at 6 PM.  09/25/13   Historical Provider, MD  cephALEXin (KEFLEX) 500 MG capsule Take 1 capsule (500 mg total) by mouth 4 (four) times daily. X 7 days 03/28/14   Annett Gula Aiya Keach, PA  hydrochlorothiazide (HYDRODIURIL) 25 MG tablet Take 1 tablet (25 mg total) by  mouth daily. 07/18/13   Leana Gamer, MD  HYDROcodone-acetaminophen ([place]) 10-325 MG per tablet Take 1 tablet by mouth every 6 (six) hours as needed. 03/27/14   Leana Gamer, MD  lisinopril (PRINIVIL,ZESTRIL) 40 MG tablet Take 1 tablet (40 mg total) by mouth daily. 02/07/14   Leana Gamer, MD  metFORMIN (GLUCOPHAGE) 500 MG tablet TAKE 2 TABLETS IN THE MORNING AND 1 TABLET IN THE EVENING 03/13/14   Leana Gamer, MD  nitroGLYCERIN (NITROSTAT) 0.4 MG SL tablet Place 0.4 mg under the tongue every 5 (five) minutes as needed. For chest pain    Historical Provider, MD  warfarin (COUMADIN) 5 MG tablet Take 1.5 tablets (7.5 mg total) by mouth one time only at 6 PM. 01/10/14   [name], FNP   BP 143/92  Pulse 63  Temp(Src) 98.6 F (37 C) (Oral)  Resp 16  SpO2 97% Physical Exam  Nursing note and vitals reviewed. Constitutional: He is oriented to person, place, and time. He appears well-developed and well-nourished. No distress.  HENT:  Head: Normocephalic and atraumatic.  Right Ear: Hearing, tympanic membrane, external ear and ear canal normal.  Left Ear: Hearing, tympanic membrane, external ear and ear canal normal.  Mouth/Throat: Uvula is midline, oropharynx is clear and moist and mucous membranes are normal. No oral lesions.  Eyes: Conjunctivae are normal. No scleral icterus.  Neck: Normal range of motion. Neck supple. No JVD present.  Cardiovascular: Normal rate, regular rhythm and normal heart sounds.   Pulmonary/Chest: Effort normal and breath sounds normal.  Abdominal: Soft. Bowel sounds are normal. He exhibits no distension. There is no tenderness.  Musculoskeletal: Normal range of motion.  Lymphadenopathy:    He has no cervical adenopathy.  Neurological: He is alert and oriented to person, place, and time. He displays no tremor. No cranial nerve deficit or sensory deficit. He exhibits normal muscle tone. Coordination and gait normal. GCS eye subscore is 4. GCS  verbal subscore is 5. GCS motor subscore is 6.  Skin: Skin is warm and dry. No rash noted. No erythema.  Psychiatric: He has a normal mood and affect. His behavior is normal.    ED Course  Procedures (including critical care time) Labs Review Labs Reviewed  POCT I-STAT, CHEM 8 - Abnormal; Notable for the following:    Glucose, Bld 134 (*)    All other components within normal limits  POCT URINALYSIS DIP (DEVICE) - Abnormal; Notable for the following:    Bilirubin Urine SMALL (*)    Hgb urine dipstick TRACE (*)    Nitrite POSITIVE (*)    Leukocytes, UA SMALL (*)    All other components within normal limits  URINE CULTURE  PROTIME-INR    Imaging Review No results found.   MDM   1. UTI (lower urinary tract infection)   2. Dehydration  Istat chem 8 with mild hyperglycemia UA consistent with UTI. Will send for C&S. Will initiate treatment with Cephalexin and advise PCP follow up.  ECG: Sinus bradycardia at 50bpm  and non-specific ST/T wave changes.  I suspect patient is feeling unwell as a result of UTI. No suspicion of ACS, CVA, PE, sepsis. Vital signs grossly normal and exam without focal deficit. Advised to increase hydration, take Cephalexin as prescribed and follow up with PCP. Patient advised to report directly to ER should symptoms return, become suddenly worse or severe.     [place], [place] 03/28/14 1203  03/28/2014 Addendum: Contacted patient by phone and notified him that his INR result was 2.0 and he could notify his hematologist of this information. No recommended changes in warfarin dose.   [place], [place] 03/28/14 1306

## 2014-03-28 NOTE — ED Provider Notes (Signed)
Medical screening examination/treatment/procedure(s) were performed by resident physician or non-physician practitioner and as supervising physician I was immediately available for consultation/collaboration.  Maryruth Eve, MD     Melony Overly, MD 03/28/14 [phone]

## 2014-03-28 NOTE — Telephone Encounter (Signed)
Patient reports PT/INR of 2.0 tested at urgent care today. Please advise. Thanks!

## 2014-03-28 NOTE — ED Provider Notes (Signed)
Medical screening examination/treatment/procedure(s) were performed by resident physician or non-physician practitioner and as supervising physician I was immediately available for consultation/collaboration.  Maryruth Eve, MD     Melony Overly, MD 03/28/14 [phone]

## 2014-03-28 NOTE — Telephone Encounter (Signed)
Patient went to urgent care this am for dizziness. Diagnosed with UTI. PT INR done at urgent care. Patient reports 2.0 for INR.

## 2014-03-28 NOTE — Telephone Encounter (Signed)
Mr. Mirsky is therapeutic on current dose. Will re-check INR in 1 month.

## 2014-03-28 NOTE — ED Notes (Signed)
C/o dizzy See physician note

## 2014-03-29 LAB — URINE CULTURE
Colony Count: >=100000
Special Requests: NORMAL

## 2014-03-29 NOTE — Telephone Encounter (Signed)
Called patient and advised of therapeutic dose of medication and to continue same regiment and to have pt/inr lab re-checked in 1 month. Matthew Ochoa verbalized understanding

## 2014-04-10 ENCOUNTER — Telehealth: Payer: Self-pay | Admitting: Internal Medicine

## 2014-04-10 DIAGNOSIS — Z7901 Long term (current) use of anticoagulants: Secondary | ICD-10-CM

## 2014-04-10 MED ORDER — WARFARIN SODIUM 5 MG PO TABS: 7.5000 mg | ORAL_TABLET | Freq: Once | ORAL | Status: DC

## 2014-04-10 NOTE — Telephone Encounter (Signed)
Sent in Coumadin, pt had pt/inr checked x 1 week ago. Thanks!

## 2014-04-14 ENCOUNTER — Encounter (HOSPITAL_COMMUNITY): Payer: Self-pay | Admitting: Emergency Medicine

## 2014-04-14 ENCOUNTER — Emergency Department (HOSPITAL_COMMUNITY)
Admission: EM | Admit: 2014-04-14 | Discharge: 2014-04-14 | Disposition: A | Discharge status: Home / Self Care | Payer: No Typology Code available for payment source | Attending: Emergency Medicine | Admitting: Emergency Medicine

## 2014-04-14 DIAGNOSIS — Y9389 Activity, other specified: Secondary | ICD-10-CM | POA: Diagnosis not present

## 2014-04-14 DIAGNOSIS — Z043 Encounter for examination and observation following other accident: Secondary | ICD-10-CM | POA: Diagnosis not present

## 2014-04-14 DIAGNOSIS — Z87448 Personal history of other diseases of urinary system: Secondary | ICD-10-CM | POA: Insufficient documentation

## 2014-04-14 DIAGNOSIS — Z86718 Personal history of other venous thrombosis and embolism: Secondary | ICD-10-CM | POA: Insufficient documentation

## 2014-04-14 DIAGNOSIS — Z792 Long term (current) use of antibiotics: Secondary | ICD-10-CM | POA: Diagnosis not present

## 2014-04-14 DIAGNOSIS — Y9289 Other specified places as the place of occurrence of the external cause: Secondary | ICD-10-CM | POA: Diagnosis not present

## 2014-04-14 DIAGNOSIS — Z7982 Long term (current) use of aspirin: Secondary | ICD-10-CM | POA: Insufficient documentation

## 2014-04-14 DIAGNOSIS — J441 Chronic obstructive pulmonary disease with (acute) exacerbation: Secondary | ICD-10-CM | POA: Diagnosis not present

## 2014-04-14 DIAGNOSIS — I10 Essential (primary) hypertension: Secondary | ICD-10-CM | POA: Diagnosis not present

## 2014-04-14 DIAGNOSIS — Z87891 Personal history of nicotine dependence: Secondary | ICD-10-CM | POA: Insufficient documentation

## 2014-04-14 DIAGNOSIS — Z79899 Other long term (current) drug therapy: Secondary | ICD-10-CM | POA: Insufficient documentation

## 2014-04-14 DIAGNOSIS — E785 Hyperlipidemia, unspecified: Secondary | ICD-10-CM | POA: Diagnosis not present

## 2014-04-14 DIAGNOSIS — E119 Type 2 diabetes mellitus without complications: Secondary | ICD-10-CM | POA: Insufficient documentation

## 2014-04-14 DIAGNOSIS — J45901 Unspecified asthma with (acute) exacerbation: Secondary | ICD-10-CM | POA: Diagnosis not present

## 2014-04-14 DIAGNOSIS — Z7901 Long term (current) use of anticoagulants: Secondary | ICD-10-CM | POA: Insufficient documentation

## 2014-04-14 DIAGNOSIS — Z9861 Coronary angioplasty status: Secondary | ICD-10-CM | POA: Diagnosis not present
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
## 2014-04-14 NOTE — ED Notes (Signed)
Pt escorted to discharge window. Pt verbalized understanding discharge instructions. In no acute distress.  
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
## 2014-04-14 NOTE — ED Notes (Signed)
Pt alert and oriented x4. Respirations even and unlabored, bilateral symmetrical rise and fall of chest. Skin warm and dry. In no acute distress. Denies needs.  md at bedside 

## 2014-04-18 ENCOUNTER — Other Ambulatory Visit: Payer: Self-pay | Admitting: Internal Medicine

## 2014-04-25 ENCOUNTER — Other Ambulatory Visit: Payer: Self-pay | Admitting: Internal Medicine

## 2014-04-25 LAB — PROTIME-INR
INR: 1.78 — ABNORMAL HIGH (ref <1.50)
Prothrombin Time: 20.7 seconds — ABNORMAL HIGH (ref 11.6–15.2)

## 2014-04-26 ENCOUNTER — Other Ambulatory Visit: Payer: Self-pay | Admitting: Family Medicine

## 2014-04-26 ENCOUNTER — Telehealth: Payer: Self-pay | Admitting: Internal Medicine

## 2014-04-26 DIAGNOSIS — Z7901 Long term (current) use of anticoagulants: Secondary | ICD-10-CM

## 2014-04-26 NOTE — Telephone Encounter (Signed)
Called to schedule follow up appointment. Left voicemail.

## 2014-05-17 ENCOUNTER — Other Ambulatory Visit: Payer: Self-pay | Admitting: Internal Medicine

## 2014-05-22 ENCOUNTER — Other Ambulatory Visit: Payer: Self-pay | Admitting: Internal Medicine

## 2014-05-23 ENCOUNTER — Other Ambulatory Visit: Payer: Self-pay | Admitting: Internal Medicine

## 2014-05-23 ENCOUNTER — Encounter: Payer: Self-pay | Admitting: Hematology

## 2014-05-23 LAB — PROTIME-INR
INR: 4.15 — ABNORMAL HIGH (ref <1.50)
Prothrombin Time: 40.1 seconds — ABNORMAL HIGH (ref 11.6–15.2)

## 2014-05-23 NOTE — Progress Notes (Signed)
Patient ID: Matthew Sanden., male   DOB: March 19, 1946, 68 y.o.   MRN: [id number] Lab requisition for PT/INR faxed to [facility] at [phone]

## 2014-05-28 ENCOUNTER — Other Ambulatory Visit: Payer: Self-pay | Admitting: Family Medicine

## 2014-05-28 DIAGNOSIS — Z7901 Long term (current) use of anticoagulants: Secondary | ICD-10-CM

## 2014-05-28 MED ORDER — WARFARIN SODIUM 5 MG PO TABS: 7.5000 mg | ORAL_TABLET | Freq: Once | ORAL | Status: DC

## 2014-06-04 ENCOUNTER — Ambulatory Visit (INDEPENDENT_AMBULATORY_CARE_PROVIDER_SITE_OTHER): Payer: Medicare HMO | Admitting: Family Medicine

## 2014-06-04 VITALS — BP 144/86 | HR 60 | Temp 98.1°F | Resp 16 | Ht 72.0 in | Wt 257.0 lb

## 2014-06-04 DIAGNOSIS — I25810 Atherosclerosis of coronary artery bypass graft(s) without angina pectoris: Secondary | ICD-10-CM

## 2014-06-04 DIAGNOSIS — I471 Supraventricular tachycardia: Secondary | ICD-10-CM

## 2014-06-04 DIAGNOSIS — E785 Hyperlipidemia, unspecified: Secondary | ICD-10-CM

## 2014-06-04 DIAGNOSIS — E119 Type 2 diabetes mellitus without complications: Secondary | ICD-10-CM

## 2014-06-04 DIAGNOSIS — I4719 Other supraventricular tachycardia: Secondary | ICD-10-CM

## 2014-06-04 DIAGNOSIS — Z23 Encounter for immunization: Secondary | ICD-10-CM

## 2014-06-04 DIAGNOSIS — Z7901 Long term (current) use of anticoagulants: Secondary | ICD-10-CM

## 2014-06-04 DIAGNOSIS — I10 Essential (primary) hypertension: Secondary | ICD-10-CM

## 2014-06-04 DIAGNOSIS — I498 Other specified cardiac arrhythmias: Secondary | ICD-10-CM

## 2014-06-04 LAB — COMPLETE METABOLIC PANEL WITH GFR
ALT: 20 U/L (ref 0–53)
AST: 15 U/L (ref 0–37)
Albumin: 4.4 g/dL (ref 3.5–5.2)
Alkaline Phosphatase: 46 U/L (ref 39–117)
BUN: 17 mg/dL (ref 6–23)
CO2: 22 mEq/L (ref 19–32)
Calcium: 9.4 mg/dL (ref 8.4–10.5)
Chloride: 105 mEq/L (ref 96–112)
Creat: 1 mg/dL (ref 0.50–1.35)
GFR, Est African American: >89 mL/min
GFR, Est Non African American: 78 mL/min
Glucose, Bld: 98 mg/dL (ref 70–99)
Potassium: 3.6 mEq/L (ref 3.5–5.3)
Sodium: 139 mEq/L (ref 135–145)
Total Bilirubin: 0.6 mg/dL (ref 0.2–1.2)
Total Protein: 7.4 g/dL (ref 6.0–8.3)

## 2014-06-04 LAB — CBC WITH DIFFERENTIAL/PLATELET
Basophils Absolute: 0 10*3/uL (ref 0.0–0.1)
Basophils Relative: 0 % (ref 0–1)
Eosinophils Absolute: 0.4 10*3/uL (ref 0.0–0.7)
Eosinophils Relative: 4 % (ref 0–5)
HCT: 38.1 % — ABNORMAL LOW (ref 39.0–52.0)
Hemoglobin: 13.4 g/dL (ref 13.0–17.0)
Lymphocytes Relative: 28 % (ref 12–46)
Lymphs Abs: 2.8 10*3/uL (ref 0.7–4.0)
MCH: 27.4 pg (ref 26.0–34.0)
MCHC: 35.2 g/dL (ref 30.0–36.0)
MCV: 77.9 fL — ABNORMAL LOW (ref 78.0–100.0)
Monocytes Absolute: 0.6 10*3/uL (ref 0.1–1.0)
Monocytes Relative: 6 % (ref 3–12)
Neutro Abs: 6.1 10*3/uL (ref 1.7–7.7)
Neutrophils Relative %: 62 % (ref 43–77)
Platelets: 186 10*3/uL (ref 150–400)
RBC: 4.89 MIL/uL (ref 4.22–5.81)
RDW: 17.7 % — ABNORMAL HIGH (ref 11.5–15.5)
WBC: 9.9 10*3/uL (ref 4.0–10.5)

## 2014-06-04 LAB — HEMOGLOBIN A1C
Hgb A1c MFr Bld: 6.8 % — ABNORMAL HIGH (ref <5.7)
Mean Plasma Glucose: 148 mg/dL — ABNORMAL HIGH (ref <117)

## 2014-06-04 NOTE — Progress Notes (Signed)
Subjective:    Patient ID: Matthew Labrador., male    DOB: Sep 11, 1945, 68 y.o.   MRN: [id number]  HPI  [name] presents for a follow up of type 2 diabetes, hypertension and chronic anticoagulation therapy.   Patient presents for follow-up of diabetes. He states that he currently feels well. Symptoms have been well-controlled. Patient denies foot ulcerations, hyperglycemia, increase appetite, nausea, paresthesia of the feet, polydipsia, polyuria and visual disturbances.  Evaluation to date has been included: hemoglobin A1C.  [name] has not been checking blood sugars at home. Treatment to date includes Metformin, which has been effective.    Patient is also here for follow up of anticoagulation monitoring.Indication is for a history of coronary artery disease and PE. Patient denies bleeding or thromboembolic symptoms. He reports that he has not missed any doses of coumadin.  Current INR, therapeutic. He denies any dietary changes.    Patient here for follow-up of hypertension. He is not exercising and is adherent to low salt diet. [name] does not check blood pressure at home. Patient denies chest pain, chest pressure/discomfort, dyspnea, fatigue, orthopnea, palpitations, syncope and tachypnea.  Cardiovascular risk factors includes diabetes mellitus, dyslipidemia, hypertension and obesity (BMI >= 30 kg/m2).    Past Medical History  Diagnosis Date  . Sleep apnea     NPSG 03-17-98, cpap 14  . Type 2 diabetes mellitus   . Hypertension   . COPD (chronic obstructive pulmonary disease)   . HTN (hypertension)   . Allergic rhinitis   . Abdominal pain, right upper quadrant   . Calculus of gallbladder without mention of cholecystitis or obstruction   . Benign localized hyperplasia of prostate with urinary obstruction and other lower urinary tract symptoms (LUTS)(600.21)   . Hematuria, unspecified   . Diabetes mellitus   . Hyperlipidemia   . Asthma     as child  . Atrial  enlargement, left y-2   Review of Systems  Constitutional: Negative.   HENT: Negative.   Eyes: Negative.   Respiratory: Negative.   Cardiovascular: Negative.   Gastrointestinal: Negative.   Endocrine: Negative.  Negative for polydipsia, polyphagia and polyuria.  Genitourinary: Negative.   Musculoskeletal: Negative.   Skin: Negative.   Allergic/Immunologic: Negative.   Neurological: Negative.   Hematological: Negative.   Psychiatric/Behavioral: Negative.        Objective:   Physical Exam  Constitutional: He is oriented to person, place, and time. He appears well-developed and well-nourished.  HENT:  Head: Normocephalic and atraumatic.  Right Ear: External ear normal.  Left Ear: External ear normal.  Mouth/Throat: Oropharynx is clear and moist.  Eyes: Pupils are equal, round, and reactive to light.  Neck: Normal range of motion. Neck supple.  Cardiovascular: Normal rate, regular rhythm and normal heart sounds.   Pulmonary/Chest: Effort normal and breath sounds normal.  Abdominal: Soft. Bowel sounds are normal.  Musculoskeletal: Normal range of motion.  Neurological: He is alert and oriented to person, place, and time. He has normal reflexes.  Skin: Skin is warm and dry.  Psychiatric: He has a normal mood and affect. His behavior is normal. Judgment and thought content normal.         BP 144/86  Pulse 60  Temp(Src) 98.1 F (36.7 C) (Oral)  Resp 16  Ht 6' (1.829 m)  Wt 257 lb (116.574 kg)  BMI 34.85 kg/m2 Assessment & Plan:  1.  DIABETES, TYPE 2 [name] states that he has been following a carbohydrate modified diet and  staying active. He plays golf 2-3 times per week.  - CBC with Differential - COMPLETE METABOLIC PANEL WITH GFR - Hemoglobin A1c  2. HYPERTENSION BP stable on current medication regimen. He does not check blood pressure at home.  - COMPLETE METABOLIC PANEL WITH GFR  3. Chronic anticoagulation Monthly INR. Last INR therapeutic. Continue  current coumadin dose. Will need to repeat INR on 06/26/2014  4. Hyperlipidemia Stable on current medication regimen. ASCVD calculation is 46.8%  5. Atrial tachycardia [name] is followed by Dr. Terrence Dupont, cardiologist. Have requested office notes.   6. Coronary artery disease involving coronary bypass graft of native heart without angina pectoris Followed by cardiologist every 6 months.   7. Need for immunization against influenza  - Flu Vaccine QUAD 36+ mos IM (Fluarix)   RTC: 3 months  Hollis,Lachina M, FNP

## 2014-06-06 ENCOUNTER — Telehealth: Payer: Self-pay | Admitting: Family Medicine

## 2014-06-06 DIAGNOSIS — Z7901 Long term (current) use of anticoagulants: Secondary | ICD-10-CM

## 2014-06-06 NOTE — Telephone Encounter (Signed)
Reviewed laboratory values, consistent with previous findings. The patient will continue current medication regimen and have INR Drawn on 06/25/2014.    [name], FNP

## 2014-06-10 ENCOUNTER — Encounter: Payer: Self-pay | Admitting: Family Medicine

## 2014-06-14 ENCOUNTER — Ambulatory Visit (INDEPENDENT_AMBULATORY_CARE_PROVIDER_SITE_OTHER): Payer: Medicare HMO | Admitting: Family Medicine

## 2014-06-14 VITALS — BP 145/76 | HR 71 | Temp 98.2°F | Resp 18 | Ht 71.0 in | Wt 256.0 lb

## 2014-06-14 DIAGNOSIS — I10 Essential (primary) hypertension: Secondary | ICD-10-CM

## 2014-06-14 DIAGNOSIS — I25810 Atherosclerosis of coronary artery bypass graft(s) without angina pectoris: Secondary | ICD-10-CM

## 2014-06-14 DIAGNOSIS — Z7901 Long term (current) use of anticoagulants: Secondary | ICD-10-CM

## 2014-06-14 DIAGNOSIS — J439 Emphysema, unspecified: Secondary | ICD-10-CM

## 2014-06-14 DIAGNOSIS — E118 Type 2 diabetes mellitus with unspecified complications: Secondary | ICD-10-CM

## 2014-06-14 DIAGNOSIS — R059 Cough, unspecified: Secondary | ICD-10-CM

## 2014-06-14 DIAGNOSIS — R05 Cough: Secondary | ICD-10-CM

## 2014-06-14 DIAGNOSIS — J069 Acute upper respiratory infection, unspecified: Secondary | ICD-10-CM

## 2014-06-14 MED ORDER — AZITHROMYCIN 250 MG PO TABS
ORAL_TABLET | ORAL | Status: DC
Start: 2014-06-14 — End: 2014-07-05

## 2014-06-14 MED ORDER — CHLORPHEN-PE-ACETAMINOPHEN 4-10-325 MG PO TABS: 1.0000 | ORAL_TABLET | Freq: Four times a day (QID) | ORAL | Status: DC | PRN

## 2014-06-19 ENCOUNTER — Encounter: Payer: Self-pay | Admitting: Family Medicine

## 2014-06-19 NOTE — Progress Notes (Signed)
   Subjective:    Patient ID: Matthew Labrador., male    DOB: 28-Oct-1945, 68 y.o.   MRN: [id number]  HPI [name], 68 year old patient with a history of COPD, diabetes, and hypertension presents with a productive cough for 2 weeks.Patient complains of nasal congestion and productive cough with sputum described as yellow and thin.  Symptoms began 2 weeks ago.  The cough is without wheezing and is aggravated by walking and lying in a reclining position.  Associated symptoms include:postnasal drip and sputum production. Patient does not have a history of asthma. Patient does  have a history of environmental allergens. Patient does not have a recent history of smoking.   Review of Systems  Constitutional: Positive for fatigue. Negative for chills and unexpected weight change.  HENT: Negative for ear pain and sore throat.   Respiratory: Positive for apnea (wears CPAP at night) and cough. Negative for choking, chest tightness, shortness of breath, wheezing and stridor.   Cardiovascular: Negative.  Negative for chest pain.  Gastrointestinal: Negative.   Endocrine: Negative.   Genitourinary: Negative.   Musculoskeletal: Negative.   Skin: Negative.   Allergic/Immunologic: Positive for environmental allergies.  Neurological: Negative.   Hematological: Negative.   Psychiatric/Behavioral: Negative.        Objective:   Physical Exam  Nursing note and vitals reviewed. Constitutional: He is oriented to person, place, and time. He appears well-developed and well-nourished.  HENT:  Head: Normocephalic and atraumatic.  Right Ear: External ear normal.  Left Ear: External ear normal.  Mouth/Throat: Oropharynx is clear and moist.  Eyes: Conjunctivae and EOM are normal. Pupils are equal, round, and reactive to light.  Neck: Normal range of motion. Neck supple.  Cardiovascular: Normal rate and regular rhythm.   Pulmonary/Chest: Effort normal and breath sounds normal. No respiratory distress. He  has no rales. He exhibits no tenderness.  Abdominal: Soft. Bowel sounds are normal.  Neurological: He is alert and oriented to person, place, and time. He has normal reflexes.  Skin: Skin is warm and dry.  Psychiatric: He has a normal mood and affect. His behavior is normal. Judgment and thought content normal.      BP 145/76  Pulse 71  Temp(Src) 98.2 F (36.8 C) (Oral)  Resp 18  Ht 5\' 11"  (1.803 m)  Wt 256 lb (116.121 kg)  BMI 35.72 kg/m2    Assessment & Plan:  1. Acute upper respiratory infection - azithromycin (ZITHROMAX) 250 MG tablet; Day 1= 500 mg; Day 2-5= 250 mg  Dispense: 6 tablet; Refill: 0 - Chlorphen-PE-Acetaminophen (NOREL AD) 4-10-325 MG TABS; Take 1 tablet by mouth every 6 (six) hours as needed.  Dispense: 20 tablet; Refill: 0  2. Cough Productive; worsening at night interfering with sleep.    3. Pulmonary emphysema, unspecified emphysema type No wheezing, SOB or strider. Emphysema stable  4. Esssential hypertension Stable on current medication regimen  5. Diabetes mellitus type 2 with complications Stable on current medication regimen  6. Chronic anticoagulation Stable: Follow up for INR as scheduled      RTC: Follow up in office as previously scheduled for HTN, chronic anticoagulation, and DMII

## 2014-06-21 ENCOUNTER — Other Ambulatory Visit: Payer: Self-pay

## 2014-06-21 ENCOUNTER — Other Ambulatory Visit: Payer: Self-pay | Admitting: Internal Medicine

## 2014-06-21 LAB — PROTIME-INR
INR: 3.82 — ABNORMAL HIGH (ref <1.50)
Prothrombin Time: 37.9 seconds — ABNORMAL HIGH (ref 11.6–15.2)

## 2014-06-24 ENCOUNTER — Other Ambulatory Visit: Payer: Self-pay

## 2014-06-24 MED ORDER — METFORMIN HCL 500 MG PO TABS
500.0000 mg | ORAL_TABLET | Freq: Three times a day (TID) | ORAL | Status: DC
Start: 2014-06-24 — End: 2014-07-05

## 2014-06-24 NOTE — Telephone Encounter (Signed)
REFILL REQUEST SENT IN VIA FAX FOR METFORMIN 500MG . REFILLED VIA ESCRIPT. THANKS!

## 2014-06-28 ENCOUNTER — Other Ambulatory Visit: Payer: Self-pay | Admitting: Family Medicine

## 2014-06-28 DIAGNOSIS — Z7901 Long term (current) use of anticoagulants: Secondary | ICD-10-CM

## 2014-06-28 MED ORDER — WARFARIN SODIUM 5 MG PO TABS: 5.0000 mg | ORAL_TABLET | Freq: Once | ORAL | Status: DC

## 2014-06-28 NOTE — Progress Notes (Signed)
INR is currently 3.82, notified patient by telephone. Inquired about current diet regimen. Patient states that he has been eating a great deal of soup and salad. We discussed his diet at length. I will reduce his Warfarin by 20 % as recommended by pharmacy and re-check INR in 2 weeks. Patient reports that he will be scheduling a dental procedure in the coming weeks. We will continue to monitor INR closely.     Meds ordered this encounter  Medications  . warfarin (COUMADIN) 5 MG tablet    Sig: Take 1 tablet (5 mg total) by mouth one time only at 6 PM.    Dispense:  30 tablet    Refill:  3    Order Specific Question:  Supervising Provider    Answer:  Liston Alba A [[id number]]  [name], FNP

## 2014-07-05 ENCOUNTER — Encounter (HOSPITAL_COMMUNITY): Payer: Self-pay | Admitting: Emergency Medicine

## 2014-07-05 ENCOUNTER — Encounter: Payer: Self-pay | Admitting: Family Medicine

## 2014-07-05 ENCOUNTER — Emergency Department (HOSPITAL_COMMUNITY)
Admission: EM | Admit: 2014-07-05 | Discharge: 2014-07-05 | Disposition: A | Discharge status: Home / Self Care | Payer: Medicare HMO | Attending: Emergency Medicine | Admitting: Emergency Medicine

## 2014-07-05 DIAGNOSIS — Z79899 Other long term (current) drug therapy: Secondary | ICD-10-CM | POA: Insufficient documentation

## 2014-07-05 DIAGNOSIS — E785 Hyperlipidemia, unspecified: Secondary | ICD-10-CM | POA: Insufficient documentation

## 2014-07-05 DIAGNOSIS — Z792 Long term (current) use of antibiotics: Secondary | ICD-10-CM | POA: Insufficient documentation

## 2014-07-05 DIAGNOSIS — M79605 Pain in left leg: Secondary | ICD-10-CM

## 2014-07-05 DIAGNOSIS — Z87448 Personal history of other diseases of urinary system: Secondary | ICD-10-CM | POA: Diagnosis not present

## 2014-07-05 DIAGNOSIS — Z87891 Personal history of nicotine dependence: Secondary | ICD-10-CM | POA: Diagnosis not present

## 2014-07-05 DIAGNOSIS — Z7901 Long term (current) use of anticoagulants: Secondary | ICD-10-CM | POA: Diagnosis not present

## 2014-07-05 DIAGNOSIS — J449 Chronic obstructive pulmonary disease, unspecified: Secondary | ICD-10-CM | POA: Diagnosis not present

## 2014-07-05 DIAGNOSIS — I10 Essential (primary) hypertension: Secondary | ICD-10-CM | POA: Diagnosis not present

## 2014-07-05 DIAGNOSIS — Z8719 Personal history of other diseases of the digestive system: Secondary | ICD-10-CM | POA: Diagnosis not present

## 2014-07-05 DIAGNOSIS — I471 Supraventricular tachycardia: Secondary | ICD-10-CM

## 2014-07-05 DIAGNOSIS — M79662 Pain in left lower leg: Secondary | ICD-10-CM | POA: Insufficient documentation

## 2014-07-05 DIAGNOSIS — M79609 Pain in unspecified limb: Secondary | ICD-10-CM

## 2014-07-05 DIAGNOSIS — Z86711 Personal history of pulmonary embolism: Secondary | ICD-10-CM | POA: Insufficient documentation

## 2014-07-05 DIAGNOSIS — E119 Type 2 diabetes mellitus without complications: Secondary | ICD-10-CM | POA: Diagnosis not present

## 2014-07-05 DIAGNOSIS — Z9861 Coronary angioplasty status: Secondary | ICD-10-CM | POA: Diagnosis not present

## 2014-07-05 LAB — BASIC METABOLIC PANEL
Anion gap: 12 (ref 5–15)
BUN: 23 mg/dL (ref 6–23)
CO2: 26 mEq/L (ref 19–32)
Calcium: 9.5 mg/dL (ref 8.4–10.5)
Chloride: 101 mEq/L (ref 96–112)
Creatinine, Ser: 1.03 mg/dL (ref 0.50–1.35)
GFR calc Af Amer: 85 mL/min — ABNORMAL LOW (ref >90)
GFR calc non Af Amer: 73 mL/min — ABNORMAL LOW (ref >90)
Glucose, Bld: 121 mg/dL — ABNORMAL HIGH (ref 70–99)
Potassium: 3.9 mEq/L (ref 3.7–5.3)
Sodium: 139 mEq/L (ref 137–147)

## 2014-07-05 LAB — CBC
HCT: 35.9 % — ABNORMAL LOW (ref 39.0–52.0)
Hemoglobin: 12.7 g/dL — ABNORMAL LOW (ref 13.0–17.0)
MCH: 26.9 pg (ref 26.0–34.0)
MCHC: 35.4 g/dL (ref 30.0–36.0)
MCV: 76.1 fL — ABNORMAL LOW (ref 78.0–100.0)
Platelets: 169 10*3/uL (ref 150–400)
RBC: 4.72 MIL/uL (ref 4.22–5.81)
RDW: 15.8 % — ABNORMAL HIGH (ref 11.5–15.5)
WBC: 7.4 10*3/uL (ref 4.0–10.5)

## 2014-07-05 LAB — PROTIME-INR
INR: 2.24 — ABNORMAL HIGH (ref 0.00–1.49)
Prothrombin Time: 24.9 seconds — ABNORMAL HIGH (ref 11.6–15.2)

## 2014-07-05 NOTE — ED Notes (Signed)
Pt c/o left leg pain that comes and goes over a year. Pt states that today the pain has gotten worse.  Pt states that back in 2013 he had DVT in that leg and is currently taking warfarin.  Pt denies heat or swelling to left lower leg.

## 2014-07-05 NOTE — Progress Notes (Signed)
Left lower extremity venous duplex completed.  Left:  No evidence of DVT, superficial thrombosis, or Baker's cyst.  Right:  Negative for DVT in the common femoral vein.  

## 2014-07-05 NOTE — ED Notes (Signed)
MD at bedside. 

## 2014-07-05 NOTE — Discharge Instructions (Signed)
Return to the ED with any concerns including leg swelling, chest pain, difficulty breathing, not able to bear weight, decreased level of alertness/lethargy, or any other alarming symptoms

## 2014-07-05 NOTE — ED Provider Notes (Signed)
CSN: [id number]     Arrival date & time 07/05/14  1004 History   First MD Initiated Contact with Patient 07/05/14 1029     Chief Complaint  Patient presents with  . Leg Pain     (Consider location/radiation/quality/duration/timing/severity/associated sxs/prior Treatment) HPI Pt presenting with c/o left leg pain.  States pain is in calf region, hurts intermittently and feels sore.  No trauma or falls recently.  No swelling or redness.  No knee pain.  Pain has been bothering him intermittently for the past several months.  Today pain was worse which prompted ED visit. Pt has hx of PE and takes coumadin.  No chest pain or shortness of breath.  There are no other associated systemic symptoms, there are no other alleviating or modifying factors.   Past Medical History  Diagnosis Date  . Sleep apnea     NPSG 03-17-98, cpap 14  . Type 2 diabetes mellitus   . Hypertension   . COPD (chronic obstructive pulmonary disease)   . HTN (hypertension)   . Allergic rhinitis   . Abdominal pain, right upper quadrant   . Calculus of gallbladder without mention of cholecystitis or obstruction   . Benign localized hyperplasia of prostate with urinary obstruction and other lower urinary tract symptoms (LUTS)(600.21)   . Hematuria, unspecified   . Diabetes mellitus   . Hyperlipidemia   . Asthma     as child  . Atrial enlargement, left y-2   Past Surgical History  Procedure Laterality Date  . Angioplasty      stent  . Coronary angioplasty with stent placement    . Colonoscopy    . Polypectomy     Family History  Problem Relation Age of Onset  . Asthma Sister   . Lung cancer Father   . Breast cancer Sister   . Colon cancer Neg Hx   . Rectal cancer Neg Hx   . Stomach cancer Neg Hx    History  Substance Use Topics  . Smoking status: Former Smoker -- 1.00 packs/day for 30 years    Types: Cigarettes    Quit date: 04/13/2011  . Smokeless tobacco: Never Used  . Alcohol Use: Yes     Comment:  Socially    Review of Systems ROS reviewed and all otherwise negative except for mentioned in HPI    Allergies  Review of patient's allergies indicates no known allergies.  Home Medications   Prior to Admission medications   Medication Sig Start Date End Date Taking? Authorizing Provider  amLODipine (NORVASC) 5 MG tablet TAKE 1 TABLET (5 MG TOTAL) BY MOUTH DAILY. 05/17/14  Yes [name], FNP  atorvastatin (LIPITOR) 40 MG tablet Take 40 mg by mouth every evening.  09/25/13  Yes Historical Provider, MD  hydrochlorothiazide (HYDRODIURIL) 25 MG tablet Take 1 tablet (25 mg total) by mouth daily. 07/18/13  Yes Leana Gamer, MD  lisinopril (PRINIVIL,ZESTRIL) 40 MG tablet Take 1 tablet (40 mg total) by mouth daily. 02/07/14  Yes Leana Gamer, MD  metFORMIN (GLUCOPHAGE) 500 MG tablet Take 500-1,000 mg by mouth 2 (two) times daily. Take 2 tablets =1000mg  in the morning, and 1 tablet =500mg  in the afternoon   Yes Historical Provider, MD  nitroGLYCERIN (NITROSTAT) 0.4 MG SL tablet Place 0.4 mg under the tongue every 5 (five) minutes as needed. For chest pain   Yes Historical Provider, MD  warfarin (COUMADIN) 5 MG tablet Take 5 mg by mouth every morning.   Yes Historical Provider,  MD  amiodarone (PACERONE) 100 MG tablet Take 100 mg by mouth. 1/2 tablet everyday 07/07/12   Hosie Poisson, MD  ciprofloxacin (CIPRO) 500 MG tablet Take 500 mg by mouth 2 (two) times daily.    Historical Provider, MD   BP 117/78  Pulse 60  Temp(Src) 97.7 F (36.5 C) (Oral)  Resp 16  SpO2 96% Vitals reviewed Physical Exam Physical Examination: General appearance - alert, well appearing, and in no distress Mental status - alert, oriented to person, place, and time Eyes - no conjunctival injection, no scleral icterus Chest - clear to auscultation, no wheezes, rales or rhonchi, symmetric air entry Heart - normal rate, regular rhythm, normal S1, S2, no murmurs, rubs, clicks or gallops Neurological -  alert, oriented x 3, strength 5/5 in extremities x 4, sensation intact Musculoskeletal - ttp over left calf, no palpable cords, no erythema or warmth, no joint tenderness, deformity or swelling Extremities - peripheral pulses normal, no pedal edema, no clubbing or cyanosis Skin - normal coloration and turgor, no rashes  ED Course  Procedures (including critical care time)  12:17 PM pt has had DVT study which was negative for DVT Labs Review Labs Reviewed  CBC - Abnormal; Notable for the following:    Hemoglobin 12.7 (*)    HCT 35.9 (*)    MCV 76.1 (*)    RDW 15.8 (*)    All other components within normal limits  BASIC METABOLIC PANEL - Abnormal; Notable for the following:    Glucose, Bld 121 (*)    GFR calc non Af Amer 73 (*)    GFR calc Af Amer 85 (*)    All other components within normal limits  PROTIME-INR - Abnormal; Notable for the following:    Prothrombin Time 24.9 (*)    INR 2.24 (*)    All other components within normal limits    Imaging Review No results found.   EKG Interpretation None      MDM   Final diagnoses:  Pain of left lower extremity    Pt presenting with c/o pain in left lower extremity, mild ttp over calf.  Pt has hx of DVT- venous duplex negative for clot,  Pt is on coumadin, INR 2.4, therapeutic.  Pt given information for f/u with orthopedics.  Discharged with strict return precautions.  Pt agreeable with plan.    Threasa Beards, MD 07/06/14 [phone]

## 2014-07-10 ENCOUNTER — Ambulatory Visit (HOSPITAL_COMMUNITY)
Admission: RE | Admit: 2014-07-10 | Discharge: 2014-07-10 | Disposition: A | Discharge status: Home / Self Care | Payer: Medicare HMO | Source: Ambulatory Visit | Attending: Family Medicine | Admitting: Family Medicine

## 2014-07-10 ENCOUNTER — Ambulatory Visit (INDEPENDENT_AMBULATORY_CARE_PROVIDER_SITE_OTHER): Payer: Medicare HMO | Admitting: Family Medicine

## 2014-07-10 ENCOUNTER — Encounter: Payer: Self-pay | Admitting: Family Medicine

## 2014-07-10 VITALS — BP 161/60 | HR 70 | Temp 97.9°F | Resp 18 | Wt 253.0 lb

## 2014-07-10 DIAGNOSIS — M79602 Pain in left arm: Secondary | ICD-10-CM

## 2014-07-10 DIAGNOSIS — I10 Essential (primary) hypertension: Secondary | ICD-10-CM

## 2014-07-10 DIAGNOSIS — R0982 Postnasal drip: Secondary | ICD-10-CM

## 2014-07-10 DIAGNOSIS — M79605 Pain in left leg: Secondary | ICD-10-CM

## 2014-07-10 DIAGNOSIS — M25362 Other instability, left knee: Secondary | ICD-10-CM

## 2014-07-10 DIAGNOSIS — S86912A Strain of unspecified muscle(s) and tendon(s) at lower leg level, left leg, initial encounter: Secondary | ICD-10-CM

## 2014-07-10 DIAGNOSIS — M25562 Pain in left knee: Secondary | ICD-10-CM | POA: Insufficient documentation

## 2014-07-10 DIAGNOSIS — R058 Other specified cough: Secondary | ICD-10-CM

## 2014-07-10 DIAGNOSIS — R05 Cough: Secondary | ICD-10-CM

## 2014-07-10 DIAGNOSIS — E118 Type 2 diabetes mellitus with unspecified complications: Secondary | ICD-10-CM

## 2014-07-10 DIAGNOSIS — S86812A Strain of other muscle(s) and tendon(s) at lower leg level, left leg, initial encounter: Secondary | ICD-10-CM

## 2014-07-10 DIAGNOSIS — Z7901 Long term (current) use of anticoagulants: Secondary | ICD-10-CM

## 2014-07-10 NOTE — Progress Notes (Signed)
Subjective:    Patient ID: Matthew Ochoa., male    DOB: [birth year], 68 y.o.   MRN: [id number]  HPI   Matthew Ochoa is a 68 year old male that presents with mild, intermittent pain to left leg and  left knHe state that he was in the ER on 10/30 for left leg pain and was checked for a DVT due to his history of pulmonary embolism. Patient is currently on coumadin therapy.  He states that they ruled out a clot and recommend that he follow up with primary provider for an orthopedic referral.  Onset of the symptoms was several weeks ago. Current symptoms include giving out, stiffness and swelling. Pain is aggravated by going up and down stairs, lateral movements, rising after sitting, squatting and walking.  Patient has had no prior knee problems. Matthew Ochoa has not attempted any OTC interventions to alleviate symptoms. He states that he is currently having mild discomfort rated at 2/10, he describes pain as intermittent and aching.   Patient is also following up for a lingering cough. Matthew Ochoa was treated for an upper respiratory infection several weeks ago. He states that symptoms have improved tremendously since completing the antibiotic. He states that cough mainly occurs upon awakening. He denies fever, chills, sneezing, or nasal congestion. He reports that he typically has post nasal drip. He is currently not taking medications to alleviate cough.   Past Medical History  Diagnosis Date  . Sleep apnea     NPSG 03-17-98, cpap 14  . Type 2 diabetes mellitus   . Hypertension   . COPD (chronic obstructive pulmonary disease)   . HTN (hypertension)   . Allergic rhinitis   . Abdominal pain, right upper quadrant   . Calculus of gallbladder without mention of cholecystitis or obstruction   . Benign localized hyperplasia of prostate with urinary obstruction and other lower urinary tract symptoms (LUTS)(600.21)   . Hematuria, unspecified   . Diabetes mellitus   . Hyperlipidemia   . Asthma     as  child  . Atrial enlargement, left y-2    Review of Systems  Constitutional: Negative for fever and fatigue.  HENT: Positive for postnasal drip.   Eyes: Negative.   Respiratory: Positive for cough.   Cardiovascular: Negative for chest pain.  Endocrine: Negative.   Genitourinary: Negative.   Musculoskeletal: Positive for myalgias, joint swelling and arthralgias.  Skin: Negative.   Allergic/Immunologic: Negative.   Neurological: Negative.   Hematological: Negative.   Psychiatric/Behavioral: Negative.        Objective:   Physical Exam  Constitutional: He is oriented to person, place, and time. Vital signs are normal. He appears well-developed. He is active.  HENT:  Head: Normocephalic and atraumatic.  Right Ear: Hearing, tympanic membrane, external ear and ear canal normal.  Left Ear: Hearing, tympanic membrane, external ear and ear canal normal.  Nose: Nose normal.  Mouth/Throat: Uvula is midline, oropharynx is clear and moist and mucous membranes are normal. He has dentures.  Musculoskeletal:       Left knee: He exhibits decreased range of motion. He exhibits no swelling and normal patellar mobility. Tenderness found.  Neurological: He is alert and oriented to person, place, and time. He has normal reflexes.  Skin: Skin is warm, dry and intact.  Psychiatric: He has a normal mood and affect. His speech is normal and behavior is normal. Judgment and thought content normal. Cognition and memory are normal.      BP 161/60 mmHg  Pulse 70  Temp(Src) 97.9 F (36.6 C) (Oral)  Resp 18  Wt 253 lb (114.76 kg)  SpO2 99%    Assessment & Plan:   1. Patellofemoral instability of left knee with pain Matthew Ochoa often feels that his leg is going to "give out" with activity. He has difficulty with lateral or pivoting movements. Recommend that he apply heat to extremity to alleviate symptoms. Patient had a doppler on 07/05/2014 that r/o a clot. He is currently on coumadin 5 mg daily and  INR is within a therapeutic range.  - DG Knee Complete 4 Views Left; Future - Arthritis Panel  2. Leg pain, anterior, left - DG Tibia/Fibula Left; Future  3.  Post-nasal drip Recommend Claritin 10 mg daily as needed for post nasal drip.   4. Occasional cough Recommend increasing water intake and humidifying air. He has clear drainage in posterior oropharynx. Recommend Claritin 10 mg daily as needed.   5. HTN (hypertension), malignant Repeat: 146/62; he states that he has been experiencing discomfort, bp is typically controlled on current medication regiment.   6. Chronic anticoagulation Patient is currently on 5 mg daily, last INR 5 days ago.    7. Diabetes mellitus type 2 with complications Stable on current medication regimen   [name], FNP

## 2014-07-11 LAB — ARTHRITIS PANEL
Anti Nuclear Antibody(ANA): NEGATIVE
Rhuematoid fact SerPl-aCnc: <10 IU/mL (ref <=14)
Sed Rate: 18 mm/hr — ABNORMAL HIGH (ref 0–16)
Uric Acid, Serum: 5.1 mg/dL (ref 4.0–7.8)

## 2014-07-19 ENCOUNTER — Other Ambulatory Visit: Payer: Self-pay | Admitting: Internal Medicine

## 2014-09-02 ENCOUNTER — Other Ambulatory Visit: Payer: Self-pay | Admitting: Internal Medicine

## 2014-09-06 ENCOUNTER — Other Ambulatory Visit: Payer: Self-pay | Admitting: Internal Medicine

## 2014-09-06 DIAGNOSIS — I517 Cardiomegaly: Secondary | ICD-10-CM

## 2014-09-06 HISTORY — DX: Cardiomegaly: I51.7

## 2014-09-12 ENCOUNTER — Telehealth: Payer: Self-pay

## 2014-09-12 DIAGNOSIS — Z7901 Long term (current) use of anticoagulants: Secondary | ICD-10-CM

## 2014-09-12 NOTE — Telephone Encounter (Signed)
Called and left message to return call. Patient needs to have pt/inr checked tomorrow. He can come here into office or go to [facility]. If he goes to [facility] we need to know which one so we can fax the order over. Thanks!

## 2014-09-12 NOTE — Telephone Encounter (Signed)
Reviewed PT/INR. Patient will need to have INR drawn, order placed.

## 2014-09-13 ENCOUNTER — Other Ambulatory Visit (INDEPENDENT_AMBULATORY_CARE_PROVIDER_SITE_OTHER): Payer: Medicare HMO

## 2014-09-13 DIAGNOSIS — Z7901 Long term (current) use of anticoagulants: Secondary | ICD-10-CM

## 2014-09-14 LAB — PROTIME-INR
INR: 1.62 — ABNORMAL HIGH (ref <1.50)
Prothrombin Time: 19.2 seconds — ABNORMAL HIGH (ref 11.6–15.2)

## 2014-09-16 ENCOUNTER — Ambulatory Visit: Payer: Medicare HMO | Admitting: Internal Medicine

## 2014-09-17 ENCOUNTER — Telehealth: Payer: Self-pay | Admitting: Family Medicine

## 2014-09-17 DIAGNOSIS — Z7901 Long term (current) use of anticoagulants: Secondary | ICD-10-CM

## 2014-09-17 MED ORDER — WARFARIN SODIUM 5 MG PO TABS: ORAL_TABLET | ORAL | Status: DC

## 2014-09-17 NOTE — Telephone Encounter (Signed)
Consulted pharmacist about current coumadin dose. Will increase dose by 20%. Patient to take 5 mg on T, Th, Sat, Sun and increase to 7.5 mg on M, W, F. We will re-check INR in 2 weeks.  Meds ordered this encounter  Medications  . warfarin (COUMADIN) 5 MG tablet    Sig: Take 7.5 mg Monday, Wednesday and Friday Take 5 mg Tuesday, Thursday, Saturday, and Sunday    Dispense:  34 tablet    Refill:  0    Layla Gramm M, FNP

## 2014-09-26 ENCOUNTER — Ambulatory Visit: Payer: Medicare HMO | Admitting: Internal Medicine

## 2014-09-26 ENCOUNTER — Other Ambulatory Visit: Payer: Self-pay | Admitting: Family Medicine

## 2014-09-26 VITALS — BP 132/70 | HR 57 | Temp 97.9°F | Resp 16 | Ht 71.0 in | Wt 250.0 lb

## 2014-09-26 DIAGNOSIS — I10 Essential (primary) hypertension: Secondary | ICD-10-CM

## 2014-09-26 DIAGNOSIS — E118 Type 2 diabetes mellitus with unspecified complications: Secondary | ICD-10-CM

## 2014-09-26 DIAGNOSIS — M19019 Primary osteoarthritis, unspecified shoulder: Secondary | ICD-10-CM

## 2014-09-26 DIAGNOSIS — B351 Tinea unguium: Secondary | ICD-10-CM | POA: Insufficient documentation

## 2014-09-26 DIAGNOSIS — Z7901 Long term (current) use of anticoagulants: Secondary | ICD-10-CM

## 2014-09-26 MED ORDER — HYDROCODONE-ACETAMINOPHEN 10-325 MG PO TABS: 1.0000 | ORAL_TABLET | Freq: Four times a day (QID) | ORAL | Status: DC | PRN

## 2014-09-26 NOTE — Progress Notes (Unsigned)
Patient ID: Matthew Garfield., male   DOB: Oct 25, 1945, 69 y.o.   MRN: [id number]  HPI 69 y.o. male  presents for 3 month follow up with hypertension, hyperlipidemia, diabetes and vitamin D. His blood pressure has been controlled at home, today their BP is BP: 140/85 mmHg He does workout. He denies chest pain, shortness of breath. However he does report occasionall dizziness. Last episode 3 weeks ago.  He is on cholesterol medication and denies myalgias. His cholesterol is not at goal. The cholesterol was:  02/11/2014: Cholesterol, Total 103; HDL Cholesterol by NMR 23*; LDL (calc) 58; Triglycerides 112. 10 year CHD risk>20%. He has been working on diet and exercise for Diabetes without complications, he is not on bASA, he is on ACE/ARB, and denies nausea. Last A1C was: 06/04/2014: Hemoglobin-A1c 6.8* Patient is on Vitamin D supplement. No results found for requested labs within last 365 days.  Current Medications:  Current Outpatient Prescriptions on File Prior to Visit  Medication Sig Dispense Refill  . amiodarone (PACERONE) 100 MG tablet Take 100 mg by mouth. 1/2 tablet everyday    . amLODipine (NORVASC) 5 MG tablet TAKE 1 TABLET (5 MG TOTAL) BY MOUTH DAILY. 30 tablet 11  . atorvastatin (LIPITOR) 40 MG tablet Take 40 mg by mouth every evening.     . hydrochlorothiazide (HYDRODIURIL) 25 MG tablet TAKE 1 TABLET (25 MG TOTAL) BY MOUTH DAILY. 90 tablet 1  . lisinopril (PRINIVIL,ZESTRIL) 40 MG tablet Take 1 tablet (40 mg total) by mouth daily. 90 tablet 3  . metFORMIN (GLUCOPHAGE) 500 MG tablet Take 500-1,000 mg by mouth 2 (two) times daily. Take 2 tablets =1000mg  in the morning, and 1 tablet =500mg  in the afternoon    . nitroGLYCERIN (NITROSTAT) 0.4 MG SL tablet Place 0.4 mg under the tongue every 5 (five) minutes as needed. For chest pain    . warfarin (COUMADIN) 5 MG tablet Take 7.5 mg Monday, Wednesday and Friday Take 5 mg Tuesday, Thursday, Saturday, and Sunday 34 tablet 0  . ciprofloxacin (CIPRO)  500 MG tablet Take 500 mg by mouth 2 (two) times daily.     No current facility-administered medications on file prior to visit.   Medical History:  Past Medical History  Diagnosis Date  . Sleep apnea     NPSG 03-17-98, cpap 14  . Type 2 diabetes mellitus   . Hypertension   . COPD (chronic obstructive pulmonary disease)   . HTN (hypertension)   . Allergic rhinitis   . Abdominal pain, right upper quadrant   . Calculus of gallbladder without mention of cholecystitis or obstruction   . Benign localized hyperplasia of prostate with urinary obstruction and other lower urinary tract symptoms (LUTS)(600.21)   . Hematuria, unspecified   . Diabetes mellitus   . Hyperlipidemia   . Asthma     as child  . Atrial enlargement, left y-2   Allergies: No Known Allergies   Review of Systems:  ROS  Family history- Review and unchanged Social history- Review and unchanged Physical Exam: BP 140/85 mmHg  Pulse 57  Temp(Src) 97.9 F (36.6 C) (Oral)  Resp 16  Ht 5\' 11"  (1.803 m)  Wt 250 lb (113.399 kg)  BMI 34.88 kg/m2 Wt Readings from Last 3 Encounters:  09/26/14 250 lb (113.399 kg)  07/10/14 253 lb (114.76 kg)  06/14/14 256 lb (116.121 kg)   General Appearance: Well nourished, in no apparent distress. Eyes: PERRLA, EOMs, conjunctiva no swelling or erythema Sinuses: No Frontal/maxillary tenderness ENT/Mouth: Ext aud  canals clear, TMs without erythema, bulging. No erythema, swelling, or exudate on post pharynx.  Tonsils not swollen or erythematous. Hearing normal.  Neck: Supple, thyroid normal. No JVD or Carotid Bruit Respiratory: Respiratory effort normal, BS equal bilaterally without rales, rhonchi, wheezing or stridor.  Cardio: RRR with no MRGs. Brisk peripheral pulses without edema.  Abdomen: Soft, + BS.  Non tender, no guarding, rebound, hernias, masses. Lymphatics: Non tender without lymphadenopathy.  Musculoskeletal: Full ROM, normal strength, normal gait.  Skin: Warm, dry  without rashes, lesions, ecchymosis.  Neuro: Cranial nerves intact. No cerebellar symptoms. Sensation intact.  Psych: Awake and oriented X 3, normal affect, Insight and Judgment appropriate. Good recent and remote recall       Assessment and Plan:  1. Diabetes mellitus type 2 with complications *** - Ambulatory referral to [facility], urine - Basic Metabolic Panel  2. Toenail fungus *** - Ambulatory referral to Podiatry  3. Essential hypertension *** - Basic Metabolic Panel - Measure blood pressure  4. Chronic anticoagulation *** - Protime-INR - Protime-INR  5. Degenerative joint disease of acromioclavicular joint *** - HYDROcodone-acetaminophen (NORCO) 10-325 MG per tablet; Take 1 tablet by mouth every 6 (six) hours as needed.  Dispense: 30 tablet; Refill: 0  Hypertension: Continue medication, monitor blood pressure at home. Continue DASH diet.  Reminder to go to the ER if any CP, SOB, nausea, dizziness, severe HA, changes vision/speech, left arm numbness and tingling and jaw pain.  Cholesterol: Continue diet and exercise. Check cholesterol.   Diabetes {with or without complications:30421263}-Continue diet and exercise. Check A1C  Vitamin D Deficiency:- check level and continue medications.   Continue diet and meds as discussed. Further disposition pending results of labs. Discussed medication effects and side effects.    Matthew Sky A., MD 1:49 PM [facility]

## 2014-09-27 LAB — BASIC METABOLIC PANEL
BUN: 19 mg/dL (ref 6–23)
CO2: 25 mEq/L (ref 19–32)
Calcium: 10.2 mg/dL (ref 8.4–10.5)
Chloride: 104 mEq/L (ref 96–112)
Creat: 0.98 mg/dL (ref 0.50–1.35)
Glucose, Bld: 79 mg/dL (ref 70–99)
Potassium: 4.2 mEq/L (ref 3.5–5.3)
Sodium: 142 mEq/L (ref 135–145)

## 2014-09-27 LAB — PROTIME-INR
INR: 2.71 — ABNORMAL HIGH (ref <1.50)
Prothrombin Time: 28.8 seconds — ABNORMAL HIGH (ref 11.6–15.2)

## 2014-09-27 LAB — MICROALBUMIN, URINE: Microalb, Ur: 1 mg/dL (ref <2.0)

## 2014-10-22 ENCOUNTER — Telehealth: Payer: Self-pay | Admitting: Internal Medicine

## 2014-10-22 NOTE — Telephone Encounter (Signed)
Left message for patient to call to schedule annual Medicare Wellness visit.

## 2014-10-25 LAB — HM DIABETES EYE EXAM

## 2014-10-30 ENCOUNTER — Ambulatory Visit (INDEPENDENT_AMBULATORY_CARE_PROVIDER_SITE_OTHER): Payer: Medicare HMO | Admitting: Podiatry

## 2014-10-30 ENCOUNTER — Encounter: Payer: Self-pay | Admitting: Podiatry

## 2014-10-30 VITALS — BP 133/59 | HR 51 | Resp 18

## 2014-10-30 DIAGNOSIS — M79676 Pain in unspecified toe(s): Secondary | ICD-10-CM

## 2014-10-30 DIAGNOSIS — B351 Tinea unguium: Secondary | ICD-10-CM

## 2014-10-30 MED ORDER — CICLOPIROX 8 % EX SOLN: Freq: Every day | CUTANEOUS | Status: DC

## 2014-10-30 NOTE — Progress Notes (Signed)
   Subjective:    Patient ID: Matthew Ochoa., male    DOB: 1946/06/25, 69 y.o.   MRN: [id number]  HPI  69 year old male presents the office they with complaints of thick, elongated toenails which are painful particularly with shoe gear and pressure. He states he has had toenail fungus for approximate 5 years has been progressive. He said no prior treatment. Denies any redness or drainage from around the nail sites. He doesn't he is diabetic and states his blood sugar is typically controlled. He denies any history of ulceration or any intermittent claudication symptoms. No other complaints at this time.    Review of Systems  All other systems reviewed and are negative.      Objective:   Physical Exam AAO 3, NAD DP/PT pulses palpable, CRT less than 3 seconds Protective sensation intact with Simms Weinstein monofilament, vibratory sensation intact, Achilles tendon reflex intact. Nails are hypertrophic, dystrophic, elongated, brittle, discolored 10. There is no surrounding erythema or drainage from the nail sites. There is tenderness on nails 1 through 5 bilaterally. No open lesions or pre-ulcerative lesions are identified. No interdigital maceration. No other areas of tenderness to bilateral lower extremity. No overlying edema, erythema, increase in warmth. MMT 5/5, ROM WNL No pain with calf compression, swelling, warmth, erythema.       Assessment & Plan:  69 year old male with symptomatic onychomycosis -Treatment options discussed including alternatives, risks, complications.  -Nails sharply debrided 10 without complications/bleeding. -Discussed treatment options for onychomycosis. At this time the patient has elected to proceed with Penlac. A prescription for this was given to the patient. Patient was directed on usage. Discussed side effects the medication and directed to call the office if any are to occur.  -Follow-up in 3 months or sooner if any problems are to arise. In the  meantime, encouraged to call the office any questions, concerns, changes symptoms.

## 2014-10-30 NOTE — Patient Instructions (Signed)
Diabetes and Foot Care Diabetes may cause you to have problems because of poor blood supply (circulation) to your feet and legs. This may cause the skin on your feet to become thinner, break easier, and heal more slowly. Your skin may become dry, and the skin may peel and crack. You may also have nerve damage in your legs and feet causing decreased feeling in them. You may not notice minor injuries to your feet that could lead to infections or more serious problems. Taking care of your feet is one of the most important things you can do for yourself.  HOME CARE INSTRUCTIONS  Wear shoes at all times, even in the house. Do not go barefoot. Bare feet are easily injured.  Check your feet daily for blisters, cuts, and redness. If you cannot see the bottom of your feet, use a mirror or ask someone for help.  Wash your feet with warm water (do not use hot water) and mild soap. Then pat your feet and the areas between your toes until they are completely dry. Do not soak your feet as this can dry your skin.  Apply a moisturizing lotion or petroleum jelly (that does not contain alcohol and is unscented) to the skin on your feet and to dry, brittle toenails. Do not apply lotion between your toes.  Trim your toenails straight across. Do not dig under them or around the cuticle. File the edges of your nails with an emery board or nail file.  Do not cut corns or calluses or try to remove them with medicine.  Wear clean socks or stockings every day. Make sure they are not too tight. Do not wear knee-high stockings since they may decrease blood flow to your legs.  Wear shoes that fit properly and have enough cushioning. To break in new shoes, wear them for just a few hours a day. This prevents you from injuring your feet. Always look in your shoes before you put them on to be sure there are no objects inside.  Do not cross your legs. This may decrease the blood flow to your feet.  If you find a minor scrape,  cut, or break in the skin on your feet, keep it and the skin around it clean and dry. These areas may be cleansed with mild soap and water. Do not cleanse the area with peroxide, alcohol, or iodine.  When you remove an adhesive bandage, be sure not to damage the skin around it.  If you have a wound, look at it several times a day to make sure it is healing.  Do not use heating pads or hot water bottles. They may burn your skin. If you have lost feeling in your feet or legs, you may not know it is happening until it is too late.  Make sure your health care provider performs a complete foot exam at least annually or more often if you have foot problems. Report any cuts, sores, or bruises to your health care provider immediately. SEEK MEDICAL CARE IF:   You have an injury that is not healing.  You have cuts or breaks in the skin.  You have an ingrown nail.  You notice redness on your legs or feet.  You feel burning or tingling in your legs or feet.  You have pain or cramps in your legs and feet.  Your legs or feet are numb.  Your feet always feel cold. SEEK IMMEDIATE MEDICAL CARE IF:   There is increasing redness,   swelling, or pain in or around a wound.  There is a red line that goes up your leg.  Pus is coming from a wound.  You develop a fever or as directed by your health care provider.  You notice a bad smell coming from an ulcer or wound. Document Released: 08/20/2000 Document Revised: 04/25/2013 Document Reviewed: 01/30/2013 ExitCare Patient Information 2015 [employer]. This information is not intended to replace advice given to you by your health care provider. Make sure you discuss any questions you have with your health care provider.  

## 2014-10-31 ENCOUNTER — Encounter: Payer: Self-pay | Admitting: Podiatry

## 2014-11-01 ENCOUNTER — Telehealth: Payer: Self-pay | Admitting: *Deleted

## 2014-11-01 NOTE — Telephone Encounter (Signed)
Pt states Penlac from his pharmacy is $89.00, and was told there a medication for $8.00.  Called the pt's phone and was told to call back.

## 2014-11-04 ENCOUNTER — Other Ambulatory Visit: Payer: Self-pay | Admitting: Podiatry

## 2014-11-05 ENCOUNTER — Telehealth: Payer: Self-pay | Admitting: Family Medicine

## 2014-11-05 DIAGNOSIS — Z7901 Long term (current) use of anticoagulants: Secondary | ICD-10-CM

## 2014-11-05 NOTE — Telephone Encounter (Signed)
Reviewed previous INR. Patient to report to [facility] on 11/08/2014 to have labs drawn.    [name], FNP

## 2014-11-08 ENCOUNTER — Other Ambulatory Visit: Payer: Self-pay | Admitting: Internal Medicine

## 2014-11-09 LAB — PROTIME-INR
INR: 1.97 — ABNORMAL HIGH (ref <1.50)
Prothrombin Time: 22.4 seconds — ABNORMAL HIGH (ref 11.6–15.2)

## 2014-11-11 ENCOUNTER — Other Ambulatory Visit: Payer: Self-pay | Admitting: Internal Medicine

## 2014-11-11 ENCOUNTER — Other Ambulatory Visit: Payer: Self-pay

## 2014-11-11 DIAGNOSIS — E1100 Type 2 diabetes mellitus with hyperosmolarity without nonketotic hyperglycemic-hyperosmolar coma (NKHHC): Secondary | ICD-10-CM

## 2014-11-11 DIAGNOSIS — E1165 Type 2 diabetes mellitus with hyperglycemia: Secondary | ICD-10-CM

## 2014-11-11 MED ORDER — METFORMIN HCL 500 MG PO TABS: 500.0000 mg | ORAL_TABLET | Freq: Three times a day (TID) | ORAL | Status: DC

## 2014-11-11 NOTE — Telephone Encounter (Signed)
Refilled metformin and sent into pharmacy. Thanks!

## 2014-11-11 NOTE — Telephone Encounter (Signed)
THIS WAS SENT IN THIS MORNING. THANKS!

## 2014-11-12 ENCOUNTER — Telehealth: Payer: Self-pay | Admitting: Family Medicine

## 2014-11-12 DIAGNOSIS — Z7901 Long term (current) use of anticoagulants: Secondary | ICD-10-CM

## 2014-11-12 NOTE — Telephone Encounter (Signed)
Reviewed INR, currently 1.97. Patient is mildly subtherapeutic goal is 2 to 3. I will not adjust medications. Will schedule a follow up INR. Mr. Mwangi expressed understanding and will report to [facility] for lab draw.    [name], FNP

## 2014-11-21 ENCOUNTER — Telehealth: Payer: Self-pay

## 2014-11-21 MED ORDER — METFORMIN HCL 500 MG PO TABS: 500.0000 mg | ORAL_TABLET | Freq: Three times a day (TID) | ORAL | Status: DC

## 2014-11-21 NOTE — Telephone Encounter (Signed)
Refills sent into pharmacy for metformin.

## 2014-12-06 ENCOUNTER — Other Ambulatory Visit: Payer: Self-pay | Admitting: Internal Medicine

## 2014-12-07 LAB — PROTIME-INR
INR: 1.83 — ABNORMAL HIGH (ref <1.50)
Prothrombin Time: 21.2 seconds — ABNORMAL HIGH (ref 11.6–15.2)

## 2014-12-10 ENCOUNTER — Other Ambulatory Visit: Payer: Medicare HMO

## 2014-12-17 ENCOUNTER — Telehealth: Payer: Self-pay

## 2014-12-17 ENCOUNTER — Ambulatory Visit (INDEPENDENT_AMBULATORY_CARE_PROVIDER_SITE_OTHER): Payer: Medicare HMO | Admitting: Family Medicine

## 2014-12-17 VITALS — BP 145/80 | HR 61 | Temp 98.4°F | Resp 16 | Ht 71.0 in | Wt 255.0 lb

## 2014-12-17 DIAGNOSIS — E119 Type 2 diabetes mellitus without complications: Secondary | ICD-10-CM

## 2014-12-17 DIAGNOSIS — Z7901 Long term (current) use of anticoagulants: Secondary | ICD-10-CM | POA: Diagnosis not present

## 2014-12-17 DIAGNOSIS — I10 Essential (primary) hypertension: Secondary | ICD-10-CM | POA: Diagnosis not present

## 2014-12-17 DIAGNOSIS — I471 Supraventricular tachycardia: Secondary | ICD-10-CM

## 2014-12-17 DIAGNOSIS — E785 Hyperlipidemia, unspecified: Secondary | ICD-10-CM

## 2014-12-17 LAB — HEMOGLOBIN A1C
Hgb A1c MFr Bld: 6.3 % — ABNORMAL HIGH (ref <5.7)
Mean Plasma Glucose: 134 mg/dL — ABNORMAL HIGH (ref <117)

## 2014-12-17 LAB — COMPLETE METABOLIC PANEL WITH GFR
ALT: 18 U/L (ref 0–53)
AST: 19 U/L (ref 0–37)
Albumin: 4.1 g/dL (ref 3.5–5.2)
Alkaline Phosphatase: 37 U/L — ABNORMAL LOW (ref 39–117)
BUN: 16 mg/dL (ref 6–23)
CO2: 24 mEq/L (ref 19–32)
Calcium: 9.5 mg/dL (ref 8.4–10.5)
Chloride: 104 mEq/L (ref 96–112)
Creat: 1.13 mg/dL (ref 0.50–1.35)
GFR, Est African American: 77 mL/min
GFR, Est Non African American: 67 mL/min
Glucose, Bld: 128 mg/dL — ABNORMAL HIGH (ref 70–99)
Potassium: 3.9 mEq/L (ref 3.5–5.3)
Sodium: 139 mEq/L (ref 135–145)
Total Bilirubin: 0.5 mg/dL (ref 0.2–1.2)
Total Protein: 7 g/dL (ref 6.0–8.3)

## 2014-12-17 MED ORDER — METFORMIN HCL 500 MG PO TABS: 500.0000 mg | ORAL_TABLET | Freq: Three times a day (TID) | ORAL | Status: DC

## 2014-12-17 NOTE — Progress Notes (Signed)
Subjective:    Patient ID: Matthew Ochoa., male    DOB: [birth year], 69 y.o.   MRN: [id number]  HPI  Matthew Ochoa presents for a follow up of type 2 diabetes, hypertension and chronic anticoagulation therapy.   Patient presents for follow-up of diabetes. He states that he currently feels well. Symptoms have been well-controlled. Patient denies foot ulcerations, hyperglycemia, increase appetite, nausea, paresthesia of the feet, polydipsia, polyuria and visual disturbances.  Evaluation to date has been included: hemoglobin A1C.  Matthew Ochoa has not been checking blood sugars at home. Treatment to date includes Metformin, which has been effective.    Patient is also here for follow up of anticoagulation monitoring.Indication is for a history of coronary artery disease and PE. Patient denies bleeding or thromboembolic symptoms. He reports that he has not missed any doses of coumadin. Current INR, sub therapeutic. He denies any dietary changes.    Patient here for follow-up of hypertension. He is not exercising and is adherent to low salt diet. Matthew Ochoa does not check blood pressure at home. Patient denies dizziness, chest pain, chest pressure/discomfort, dyspnea, fatigue, orthopnea, palpitations, syncope and tachypnea.  Cardiovascular risk factors includes diabetes mellitus, dyslipidemia, hypertension and obesity (BMI >= 30 kg/m2).    Past Medical History  Diagnosis Date  . Sleep apnea     NPSG 03-17-98, cpap 14  . Type 2 diabetes mellitus   . Hypertension   . COPD (chronic obstructive pulmonary disease)   . HTN (hypertension)   . Allergic rhinitis   . Abdominal pain, right upper quadrant   . Calculus of gallbladder without mention of cholecystitis or obstruction   . Benign localized hyperplasia of prostate with urinary obstruction and other lower urinary tract symptoms (LUTS)(600.21)   . Hematuria, unspecified   . Diabetes mellitus   . Hyperlipidemia   . Asthma     as child  .  Atrial enlargement, left y-2   Review of Systems  Constitutional: Negative.  Negative for fatigue.  HENT: Negative.   Eyes: Negative.   Respiratory: Negative.  Negative for cough and shortness of breath.   Cardiovascular: Negative.  Negative for chest pain, palpitations and leg swelling.  Gastrointestinal: Negative.   Endocrine: Negative.  Negative for polydipsia, polyphagia and polyuria.  Genitourinary: Negative.   Musculoskeletal: Negative.   Skin: Negative.   Allergic/Immunologic: Negative.  Negative for immunocompromised state.  Neurological: Negative.   Hematological: Negative.   Psychiatric/Behavioral: Negative.  Negative for suicidal ideas and sleep disturbance.       Objective:   Physical Exam  Constitutional: He is oriented to person, place, and time. He appears well-developed and well-nourished.  HENT:  Head: Normocephalic and atraumatic.  Right Ear: External ear normal.  Left Ear: External ear normal.  Mouth/Throat: Oropharynx is clear and moist.  Eyes: Conjunctivae and EOM are normal. Pupils are equal, round, and reactive to light.  Neck: Normal range of motion. Neck supple.  Cardiovascular: Normal rate, regular rhythm and normal heart sounds.   Pulmonary/Chest: Effort normal and breath sounds normal.  Abdominal: Soft. Bowel sounds are normal.  Musculoskeletal: Normal range of motion.  Neurological: He is alert and oriented to person, place, and time. He has normal strength and normal reflexes. He displays normal reflexes. No cranial nerve deficit or sensory deficit. He displays a negative Romberg sign.  Skin: Skin is warm, dry and intact.  Psychiatric: He has a normal mood and affect. His behavior is normal. Judgment and thought content normal.  BP 145/80 mmHg  Pulse 61  Temp(Src) 98.4 F (36.9 C) (Oral)  Resp 16  Ht 5\' 11"  (1.803 m)  Wt 255 lb (115.667 kg)  BMI 35.58 kg/m2 Assessment & Plan:  1.  Diabetes mellitus type 2, controlled.  Reviewed  previous hemoglobin A1C, 6.8. Will continue Metformin 500 mg BID. Matthew Ochoa states that he has been following a carbohydrate modified diet and staying active. He plays golf 2-3 times per week.   - COMPLETE METABOLIC PANEL WITH GFR - Hemoglobin A1c  2. Essential hypertension BP at goal  on current medication regimen. He does not check blood pressure at home. Patient reports that he consistently takes Norvasc 5 mg daily.  - COMPLETE METABOLIC PANEL WITH GFR  3. Chronic anticoagulation Monthly INR. Last INR was subtherapeutic.  Discussed starting Xarelto, 20 mg daily with dinner. Current creatinine clearence is >50, patient will start 20 mg daily. Xarelto is currently on formulary.   4. Hyperlipidemia Stable on current medication regimen. ASCVD calculation is 46.8%  5. Atrial tachycardia Matthew Ochoa is followed by Dr. Terrence Dupont, cardiologist. Called Dr. Zenia Resides office, he was not available. I spoke with his RN who states that his last office visit was in November 2015. RN states that they are in the process of scheduling a follow up appointment with Dr. Terrence Dupont. She will also fax over last office note.    6. Coronary artery disease involving coronary bypass graft of native heart without angina pectoris Followed by cardiologist every 6 months. Stable on Amiodarone. Will review notes from Dr. Terrence Dupont as they become available. Previous 2 D echo was 2013, Left ER 50-55%, consistent with grade 1 diastolic dysfunction.    RTC: 3 months  Stpehanie Montroy M, FNP

## 2014-12-17 NOTE — Telephone Encounter (Signed)
Refilled metformin and sent in . Thanks!

## 2014-12-18 ENCOUNTER — Encounter: Payer: Self-pay | Admitting: Internal Medicine

## 2014-12-18 ENCOUNTER — Encounter: Payer: Self-pay | Admitting: Family Medicine

## 2014-12-18 MED ORDER — RIVAROXABAN 20 MG PO TABS: 20.0000 mg | ORAL_TABLET | Freq: Every day | ORAL | Status: AC

## 2014-12-18 MED ORDER — RIVAROXABAN 20 MG PO TABS: 20.0000 mg | ORAL_TABLET | Freq: Every day | ORAL | Status: DC

## 2014-12-19 ENCOUNTER — Telehealth: Payer: Self-pay | Admitting: Family Medicine

## 2014-12-24 NOTE — Telephone Encounter (Signed)
Matthew Ochoa and I discussed varying anticoagulant drugs at length. Matthew Ochoa is concerned over the cost of Xarelto vs. Warfarin. I spoke with pharmacist, who states that the majority of the cost is the patient's deductible. Matthew Ochoa expressed understanding and has agreed to start Xarelto. Creatinine is 1.3, will start Xarelto 20 mg with dinner.    [name], FNP

## 2015-01-15 ENCOUNTER — Telehealth: Payer: Self-pay | Admitting: Internal Medicine

## 2015-01-15 NOTE — Telephone Encounter (Signed)
Patient calling to find out if he should switch over to Xarelto. Patient is concerned about the side effects and would like to discuss before starting. Please call.

## 2015-01-15 NOTE — Telephone Encounter (Signed)
[place], Please advise, should patient come in for this? Thanks!

## 2015-01-29 ENCOUNTER — Ambulatory Visit (INDEPENDENT_AMBULATORY_CARE_PROVIDER_SITE_OTHER): Payer: Medicare HMO | Admitting: Podiatry

## 2015-01-29 ENCOUNTER — Encounter: Payer: Self-pay | Admitting: Podiatry

## 2015-01-29 ENCOUNTER — Telehealth: Payer: Self-pay | Admitting: Internal Medicine

## 2015-01-29 ENCOUNTER — Encounter: Payer: Self-pay | Admitting: Family Medicine

## 2015-01-29 ENCOUNTER — Ambulatory Visit (INDEPENDENT_AMBULATORY_CARE_PROVIDER_SITE_OTHER): Payer: Medicare HMO | Admitting: Family Medicine

## 2015-01-29 VITALS — BP 126/70 | HR 49 | Resp 18

## 2015-01-29 VITALS — BP 133/73 | HR 60 | Temp 98.3°F | Resp 18 | Wt 255.0 lb

## 2015-01-29 DIAGNOSIS — E118 Type 2 diabetes mellitus with unspecified complications: Secondary | ICD-10-CM | POA: Diagnosis not present

## 2015-01-29 DIAGNOSIS — I10 Essential (primary) hypertension: Secondary | ICD-10-CM

## 2015-01-29 DIAGNOSIS — Z7901 Long term (current) use of anticoagulants: Secondary | ICD-10-CM | POA: Diagnosis not present

## 2015-01-29 DIAGNOSIS — M1990 Unspecified osteoarthritis, unspecified site: Secondary | ICD-10-CM

## 2015-01-29 DIAGNOSIS — B351 Tinea unguium: Secondary | ICD-10-CM

## 2015-01-29 DIAGNOSIS — M19019 Primary osteoarthritis, unspecified shoulder: Secondary | ICD-10-CM

## 2015-01-29 DIAGNOSIS — M79676 Pain in unspecified toe(s): Secondary | ICD-10-CM

## 2015-01-29 MED ORDER — HYDROCODONE-ACETAMINOPHEN 10-325 MG PO TABS: 1.0000 | ORAL_TABLET | Freq: Four times a day (QID) | ORAL | Status: DC | PRN

## 2015-01-29 NOTE — Telephone Encounter (Signed)
Refill request for hydrocodone 5/325mg . LOV 12/17/2014. Please advise. Thanks!

## 2015-01-29 NOTE — Telephone Encounter (Signed)
Patient will need to schedule an appointment prior to receiving Rx for opiate pain medications.   [name], FNP

## 2015-01-29 NOTE — Progress Notes (Signed)
Subjective:    Patient ID: Matthew Ochoa., male    DOB: 1946/03/29, 69 y.o.   MRN: [id number]  HPI [name] is a 69 year old male with a history of hypertension, diabetes, and chronic anticoagulation therapy presents for follow-up of degenerative joint disease and medication management. [name] states that he has been managing degenerative joint disease with Norco as needed. Patient states that he has not received a prescription since January 2016. He states that current pain intensity is 5/10 described as aching and intermittent. Pain primarily occurs with exercise.  The onset of the pain was several months ago.  The pain occurs when active.   Symptoms are aggravated by over head listing.   Patient denies headache, fatigue, shortness of breath, chest pains, nausea, vomiting, or diarrhea.   Past Medical History  Diagnosis Date  . Sleep apnea     NPSG 03-17-98, cpap 14  . Type 2 diabetes mellitus   . Hypertension   . COPD (chronic obstructive pulmonary disease)   . HTN (hypertension)   . Allergic rhinitis   . Abdominal pain, right upper quadrant   . Calculus of gallbladder without mention of cholecystitis or obstruction   . Benign localized hyperplasia of prostate with urinary obstruction and other lower urinary tract symptoms (LUTS)(600.21)   . Hematuria, unspecified   . Diabetes mellitus   . Hyperlipidemia   . Asthma     as child  . Atrial enlargement, left y-2   History   Social History  . Marital Status: Married    Spouse Name: N/A  . Number of Children: 1  . Years of Education: N/A   Occupational History  . retired    Social History Main Topics  . Smoking status: Former Smoker -- 1.00 packs/day for 30 years    Types: Cigarettes    Quit date: 04/13/2011  . Smokeless tobacco: Never Used  . Alcohol Use: Yes     Comment: Socially  . Drug Use: No  . Sexual Activity: Yes   Other Topics Concern  . Not on file   Social History Narrative   Review of  Systems  Constitutional: Negative.   HENT: Negative.   Eyes: Negative.   Respiratory: Negative.   Cardiovascular: Negative.   Gastrointestinal: Negative.   Endocrine: Negative.   Genitourinary: Negative.   Musculoskeletal: Positive for myalgias (shoulder pain) and back pain.  Skin: Negative.   Allergic/Immunologic: Negative.   Neurological: Negative.   Hematological: Negative.   Psychiatric/Behavioral: Negative.        Objective:   Physical Exam  Constitutional: He is oriented to person, place, and time. He appears well-developed and well-nourished.  HENT:  Head: Normocephalic and atraumatic.  Right Ear: External ear normal.  Left Ear: External ear normal.  Eyes: Conjunctivae and EOM are normal. Pupils are equal, round, and reactive to light.  Neck: Normal range of motion. Neck supple.  Cardiovascular: Normal rate, regular rhythm, normal heart sounds and intact distal pulses.   Pulmonary/Chest: Effort normal and breath sounds normal.  Abdominal: Soft. Bowel sounds are normal.  Musculoskeletal: Normal range of motion.       Lumbar back: He exhibits pain. He exhibits normal range of motion, no tenderness and no spasm.  Neurological: He is alert and oriented to person, place, and time. He has normal reflexes.  Skin: Skin is warm and dry.  Psychiatric: He has a normal mood and affect. His behavior is normal. Judgment and thought content normal.  BP 133/73 mmHg  Pulse 60  Temp(Src) 98.3 F (36.8 C) (Oral)  Resp 18  Wt 255 lb (115.667 kg)  SpO2 99% Assessment & Plan:  1. Degenerative joint disease of acromioclavicular joint Pt is  aware that the prescription history is available to [place] online through the [facility] CSRS.  We reminded  that all patients receiving Schedule II narcotics must be seen for follow within one month of prescription being requested. Discussed the following aspects of pain management.:  including the need to keep medicines in a safe locked location away  from children or pets, and the need to report excess sedation or constipation, measures to avoid constipation, and policies related to early refills and stolen prescriptions. According to the [place] [facility] program, we have reviewed details related to analgesia, adverse effects, aberrant behaviors.  - HYDROcodone-acetaminophen (NORCO) 10-325 MG per tablet; Take 1 tablet by mouth every 6 (six) hours as needed.  Dispense: 30 tablet; Refill: 0  2. HTN (hypertension), malignant Pt is at goal on current medication regimen. Will continue regimen. Recommend DASH diet and exercise regimen.   3. Diabetes mellitus type 2 with complications [name] reports that he is taking his medication consistently  4. Chronic anticoagulation Patient was transitioned to Xarelto 1 month ago. Patient states that he is doing well on current medication dosage. Patient denies signs of bleeding.    RTC: 1 month for pain re-assessment  [name], FNP

## 2015-02-02 ENCOUNTER — Encounter: Payer: Self-pay | Admitting: Podiatry

## 2015-02-02 NOTE — Progress Notes (Signed)
Subjective: 69 y.o.-year-old male returns the office today for painful, elongated, thickened toenails. Denies any redness or drainage around the nails. He did not get the penlac due to cost. Denies any acute changes since last appointment and no new complaints today. Denies any systemic complaints such as fevers, chills, nausea, vomiting.   Objective: AAO 3, NAD DP/PT pulses palpable, CRT less than 3 seconds Protective sensation intact with Simms Weinstein monofilament, Achilles tendon reflex intact.  Nails hypertrophic, dystrophic, elongated, brittle, discolored 10. There is tenderness overlying the nails 1-5 bilaterally. There is no surrounding erythema or drainage along the nail sites. No open lesions or pre-ulcerative lesions are identified. No other areas of tenderness bilateral lower extremities. No overlying edema, erythema, increased warmth. No pain with calf compression, swelling, warmth, erythema.  Assessment: Patient presents with symptomatic onychomycosis  Plan: -Treatment options including alternatives, risks, complications were discussed -Nails sharply debrided 10 without complication/bleeding. -Discussed OTC treatment for nail fungus. He will consider this.  -Discussed daily foot inspection. If there are any changes, to call the office immediately.  -Follow-up in 3 months or sooner if any problems are to arise. In the meantime, encouraged to call the office with any questions, concerns, changes symptoms.

## 2015-02-22 ENCOUNTER — Other Ambulatory Visit: Payer: Self-pay | Admitting: Family Medicine

## 2015-02-28 ENCOUNTER — Ambulatory Visit: Payer: Medicare HMO | Admitting: Family Medicine

## 2015-03-19 ENCOUNTER — Ambulatory Visit (INDEPENDENT_AMBULATORY_CARE_PROVIDER_SITE_OTHER): Payer: Medicare HMO | Admitting: Family Medicine

## 2015-03-19 ENCOUNTER — Encounter: Payer: Self-pay | Admitting: Family Medicine

## 2015-03-19 VITALS — BP 134/84 | HR 59 | Temp 97.8°F | Resp 16 | Ht 71.0 in | Wt 258.0 lb

## 2015-03-19 DIAGNOSIS — I10 Essential (primary) hypertension: Secondary | ICD-10-CM

## 2015-03-19 DIAGNOSIS — E1100 Type 2 diabetes mellitus with hyperosmolarity without nonketotic hyperglycemic-hyperosmolar coma (NKHHC): Secondary | ICD-10-CM

## 2015-03-19 DIAGNOSIS — E1165 Type 2 diabetes mellitus with hyperglycemia: Secondary | ICD-10-CM

## 2015-03-19 LAB — COMPLETE METABOLIC PANEL WITH GFR
ALT: 20 U/L (ref 0–53)
AST: 19 U/L (ref 0–37)
Albumin: 4.4 g/dL (ref 3.5–5.2)
Alkaline Phosphatase: 36 U/L — ABNORMAL LOW (ref 39–117)
BUN: 15 mg/dL (ref 6–23)
CO2: 26 mEq/L (ref 19–32)
Calcium: 9.9 mg/dL (ref 8.4–10.5)
Chloride: 102 mEq/L (ref 96–112)
Creat: 1.08 mg/dL (ref 0.50–1.35)
GFR, Est African American: 81 mL/min
GFR, Est Non African American: 70 mL/min
Glucose, Bld: 87 mg/dL (ref 70–99)
Potassium: 4.1 mEq/L (ref 3.5–5.3)
Sodium: 140 mEq/L (ref 135–145)
Total Bilirubin: 0.6 mg/dL (ref 0.2–1.2)
Total Protein: 7.4 g/dL (ref 6.0–8.3)

## 2015-03-19 LAB — LIPID PANEL
Cholesterol: 111 mg/dL (ref 0–200)
HDL: 27 mg/dL — ABNORMAL LOW (ref >=40)
LDL Cholesterol: 60 mg/dL (ref 0–99)
Total CHOL/HDL Ratio: 4.1 Ratio
Triglycerides: 119 mg/dL (ref <150)
VLDL: 24 mg/dL (ref 0–40)

## 2015-03-19 LAB — HEMOGLOBIN A1C
Hgb A1c MFr Bld: 6.2 % — ABNORMAL HIGH (ref <5.7)
Mean Plasma Glucose: 131 mg/dL — ABNORMAL HIGH (ref <117)

## 2015-03-19 MED ORDER — METFORMIN HCL 500 MG PO TABS: 500.0000 mg | ORAL_TABLET | Freq: Three times a day (TID) | ORAL | Status: AC

## 2015-03-19 MED ORDER — ATORVASTATIN CALCIUM 40 MG PO TABS: 40.0000 mg | ORAL_TABLET | Freq: Every evening | ORAL | Status: DC

## 2015-03-19 MED ORDER — HYDROCHLOROTHIAZIDE 25 MG PO TABS: ORAL_TABLET | ORAL | Status: DC

## 2015-03-19 MED ORDER — AMLODIPINE BESYLATE 5 MG PO TABS: ORAL_TABLET | ORAL | Status: DC

## 2015-03-19 NOTE — Progress Notes (Signed)
Patient ID: Matthew Ochoa., male   DOB: 1946-04-09, 69 y.o.   MRN: [id number]   Etan Vasudevan, is a 69 y.o. male  [id number]:[id number]  [id number]  DOB - 09-12-1945  CC:  Chief Complaint  Patient presents with  . Follow-up    coughing. Follow up pain        HPI: Matthew Ochoa is a 69 y.o. male here follow-up low back pain. He has had back pain for years but it was aggravated in October, 2015 when he was in an accident. She was seen and evaluated at [facility] and [facility] and received PT for a while. He was here about a month ago and was prescribed hydrocodone/acetemenophen. He reports having about 1/2 of the 30 left.  I have explained we can no prescribe this chronically. He also has hypertension and diabetes. He is on amlodipine 5 daily, lisinopril 40 daily and HC 25 daily for hypertension and metformin 500, 2 in am and one in pm for diabetes. He has a diagnosis of CAD with stent, Pulmonary embolusus treated with Lennette Bihari, COPD for which he received no treatment. He does report a dry tickly throat cough frequently. He has BPH and is on fenesteride by urology.He has sleep apnea but reports no using his Cpap.  No Known Allergies Past Medical History  Diagnosis Date  . Sleep apnea     NPSG 03-17-98, cpap 14  . Type 2 diabetes mellitus   . Hypertension   . COPD (chronic obstructive pulmonary disease)   . HTN (hypertension)   . Allergic rhinitis   . Abdominal pain, right upper quadrant   . Calculus of gallbladder without mention of cholecystitis or obstruction   . Benign localized hyperplasia of prostate with urinary obstruction and other lower urinary tract symptoms (LUTS)(600.21)   . Hematuria, unspecified   . Diabetes mellitus   . Hyperlipidemia   . Asthma     as child  . Atrial enlargement, left y-2   Current Outpatient Prescriptions on File Prior to Visit  Medication Sig Dispense Refill  . amiodarone (PACERONE) 100 MG tablet Take 100 mg by mouth. 1/2 tablet everyday    . amLODipine  (NORVASC) 5 MG tablet TAKE 1 TABLET (5 MG TOTAL) BY MOUTH DAILY. 30 tablet 11  . atorvastatin (LIPITOR) 40 MG tablet Take 40 mg by mouth every evening.     . ciclopirox (PENLAC) 8 % solution Apply topically at bedtime. Apply over nail and surrounding skin. Apply daily over previous coat. After seven (7) days, may remove with alcohol and continue cycle. 6.6 mL 4  . finasteride (PROSCAR) 5 MG tablet   5  . hydrochlorothiazide (HYDRODIURIL) 25 MG tablet TAKE 1 TABLET (25 MG TOTAL) BY MOUTH DAILY. 90 tablet 1  . HYDROcodone-acetaminophen (NORCO) 10-325 MG per tablet Take 1 tablet by mouth every 6 (six) hours as needed. 30 tablet 0  . lisinopril (PRINIVIL,ZESTRIL) 40 MG tablet Take 1 tablet (40 mg total) by mouth daily. 90 tablet 3  . metFORMIN (GLUCOPHAGE) 500 MG tablet Take 500-1,000 mg by mouth 2 (two) times daily. Take 2 tablets =1000mg  in the morning, and 1 tablet =500mg  in the afternoon    . metFORMIN (GLUCOPHAGE) 500 MG tablet Take 1 tablet (500 mg total) by mouth 3 (three) times daily. 90 tablet 3  . metFORMIN (GLUCOPHAGE) 500 MG tablet TAKE 1 TABLET BY MOUTH THREE TIMES DAILY 270 tablet 1  . nitroGLYCERIN (NITROSTAT) 0.4 MG SL tablet Place 0.4 mg under the tongue every 5 (five) minutes  as needed. For chest pain    . rivaroxaban (XARELTO) 20 MG TABS tablet Take 1 tablet (20 mg total) by mouth daily with supper. 30 tablet 5   No current facility-administered medications on file prior to visit.   Family History  Problem Relation Age of Onset  . Asthma Sister   . Lung cancer Father   . Breast cancer Sister   . Colon cancer Neg Hx   . Rectal cancer Neg Hx   . Stomach cancer Neg Hx    History   Social History  . Marital Status: Married    Spouse Name: N/A  . Number of Children: 1  . Years of Education: N/A   Occupational History  . retired    Social History Main Topics  . Smoking status: Former Smoker -- 1.00 packs/day for 30 years    Types: Cigarettes    Quit date: 04/13/2011  .  Smokeless tobacco: Never Used  . Alcohol Use: Yes     Comment: Socially  . Drug Use: No  . Sexual Activity: Yes   Other Topics Concern  . Not on file   Social History Narrative    Review of Systems: Constitutional: Negative for fever, chills, appetite change, weight loss,  Fatigue. Skin: Rash on left elbow, chronic HENT: Negative for ear pain, ear discharge.nose bleeds Eyes: Negative for pain, discharge, redness, itching and visual disturbance. Neck: Negative for pain, stiffness Respiratory: Negative for shortness of breath,  Positive for tickly throat cough. Cardiovascular: Negative for chest pain, palpitations. Occassional swelling of feet and legs. Gastrointestinal: Negative for abdominal distention, abdominal pain, nausea, vomiting, diarrhea, constipations Genitourinary: Negative for dysuria, urgency, frequency, hematuria, flank pain,  Musculoskeletal: Positive for back pain, knee pain.Negative for arthralgia and gait problem.Negative for weakness. Neurological: Negative for dizziness, tremors, seizures, syncope,   light-headedness, numbness and headaches.  Hematological: Negative or bleeding. Positive for easy bruising on Xerelto. Psychiatric/Behavioral: Negative for depression, anxiety, decreased concentration, confusion   Objective:   Filed Vitals:   03/19/15 1031  BP: 134/84  Pulse: 59  Temp: 97.8 F (36.6 C)  Resp: 16    Physical Exam: Constitutional: Patient appears well-developed and well-nourished. No distress. HENT: Normocephalic, atraumatic, External right and left ear normal. Oropharynx is clear and moist.  Eyes: Conjunctivae and EOM are normal. PERRLA, no scleral icterus. Neck: Normal ROM. Neck supple. No lymphadenopathy, No thyromegaly. CVS: RRR, S1/S2 +, no murmurs, no gallops, no rubs Pulmonary: Effort and breath sounds normal, no stridor, rhonchi, wheezes, rales.  Abdominal: Soft. Normoactive BS,, no distension, tenderness, rebound or guarding.   Musculoskeletal: Normal range of motion. No edema and no tenderness. No back tenderness. There is some decrease in flexibility of the back. Neuro: Alert.Normal muscle tone coordination. Non-focal Skin: Skin is warm and dry. No rash noted. Not diaphoretic. No erythema. No pallor. Psychiatric: Normal mood and affect. Behavior, judgment, thought content normal.  Lab Results  Component Value Date   WBC 7.4 07/05/2014   HGB 12.7* 07/05/2014   HCT 35.9* 07/05/2014   MCV 76.1* 07/05/2014   PLT 169 07/05/2014   Lab Results  Component Value Date   CREATININE 1.13 12/17/2014   BUN 16 12/17/2014   NA 139 12/17/2014   K 3.9 12/17/2014   CL 104 12/17/2014   CO2 24 12/17/2014    Lab Results  Component Value Date   HGBA1C 6.3* 12/17/2014   Lipid Panel     Component Value Date/Time   CHOL 103 02/11/2014 0807   TRIG 112  02/11/2014 0807   HDL 23* 02/11/2014 0807   CHOLHDL 4.5 02/11/2014 0807   VLDL 22 02/11/2014 0807   LDLCALC 58 02/11/2014 0807       Assessment and plan:   Low back pain - I have explained we cannot prescribe controlled substances other than Tylenol #3 and Tramadol -Because of his CAD, I am reluctant to prescribe NSAID -He will let me know if he would like a referral to ortho. -He will let me know if he needs Tramadol or Tylenol #3 when he is out of Hyrocodone.  Hypertension, currently in adequate control -continue to watch salt -walk 5/7 days -continue current medical regime.  Dry cough - I have suggested discontinuing the lisinopril but he is unwilling to do that until he runs out. He has several refills. - I have asked him to determine if the cough seens to be related to reflux.  Diabetes, type II in good control -Cmet with GFR, CBC, A1c -continue with metformin 500 3 a day unless instructed otherwise.  Follow-up in 3 months and prn for reassessment of chronic conditions.   Micheline Chapman.  The patient was given clear instructions to go to ER  or return to medical center if symptoms don't improve, worsen or new problems develop. The patient verbalized understanding. The patient was told to call to get lab results if they haven't heard anything in the next week.     This note has been created with Surveyor, quantity. Any transcriptional errors are unintentional.    Micheline Chapman, MSN, FNP-BC   03/19/2015, 10:51 AM

## 2015-03-19 NOTE — Patient Instructions (Signed)
Let me know if you need something further for back pain once you are out of hydrocodone. Let me know if you would like a referral to ortho. Continue metformin 500 2 in am, one in pm. Continue BP medications as is.

## 2015-04-05 ENCOUNTER — Other Ambulatory Visit: Payer: Self-pay | Admitting: Internal Medicine

## 2015-04-09 ENCOUNTER — Telehealth: Payer: Self-pay | Admitting: Family Medicine

## 2015-04-09 NOTE — Telephone Encounter (Signed)
Refill request for Tramadol. LOV 03/19/2015. Please advise. Thanks!

## 2015-04-10 ENCOUNTER — Other Ambulatory Visit: Payer: Self-pay | Admitting: Family Medicine

## 2015-04-10 MED ORDER — TRAMADOL HCL 50 MG PO TABS: 50.0000 mg | ORAL_TABLET | Freq: Three times a day (TID) | ORAL | Status: DC | PRN

## 2015-04-30 ENCOUNTER — Telehealth: Payer: Self-pay

## 2015-04-30 NOTE — Telephone Encounter (Signed)
Received fax for medical records from [facility]. This has been sent to medical records for processing. Thanks!

## 2015-05-13 ENCOUNTER — Encounter (HOSPITAL_COMMUNITY): Payer: Self-pay | Admitting: Emergency Medicine

## 2015-05-13 ENCOUNTER — Emergency Department (HOSPITAL_COMMUNITY)
Admission: EM | Admit: 2015-05-13 | Discharge: 2015-05-13 | Disposition: A | Discharge status: Home / Self Care | Payer: Medicare HMO | Attending: Emergency Medicine | Admitting: Emergency Medicine

## 2015-05-13 DIAGNOSIS — J449 Chronic obstructive pulmonary disease, unspecified: Secondary | ICD-10-CM | POA: Diagnosis not present

## 2015-05-13 DIAGNOSIS — Z79899 Other long term (current) drug therapy: Secondary | ICD-10-CM | POA: Insufficient documentation

## 2015-05-13 DIAGNOSIS — E119 Type 2 diabetes mellitus without complications: Secondary | ICD-10-CM | POA: Insufficient documentation

## 2015-05-13 DIAGNOSIS — R05 Cough: Secondary | ICD-10-CM | POA: Insufficient documentation

## 2015-05-13 DIAGNOSIS — Z7901 Long term (current) use of anticoagulants: Secondary | ICD-10-CM | POA: Diagnosis not present

## 2015-05-13 DIAGNOSIS — N4889 Other specified disorders of penis: Secondary | ICD-10-CM | POA: Diagnosis not present

## 2015-05-13 DIAGNOSIS — Z9889 Other specified postprocedural states: Secondary | ICD-10-CM | POA: Insufficient documentation

## 2015-05-13 DIAGNOSIS — Z87891 Personal history of nicotine dependence: Secondary | ICD-10-CM | POA: Diagnosis not present

## 2015-05-13 DIAGNOSIS — Z8669 Personal history of other diseases of the nervous system and sense organs: Secondary | ICD-10-CM | POA: Diagnosis not present

## 2015-05-13 DIAGNOSIS — I10 Essential (primary) hypertension: Secondary | ICD-10-CM | POA: Diagnosis not present

## 2015-05-13 DIAGNOSIS — N390 Urinary tract infection, site not specified: Secondary | ICD-10-CM | POA: Diagnosis not present

## 2015-05-13 DIAGNOSIS — R059 Cough, unspecified: Secondary | ICD-10-CM

## 2015-05-13 DIAGNOSIS — Z8719 Personal history of other diseases of the digestive system: Secondary | ICD-10-CM | POA: Insufficient documentation

## 2015-05-13 DIAGNOSIS — Z9861 Coronary angioplasty status: Secondary | ICD-10-CM | POA: Insufficient documentation

## 2015-05-13 LAB — URINE MICROSCOPIC-ADD ON

## 2015-05-13 LAB — URINALYSIS, ROUTINE W REFLEX MICROSCOPIC
Bilirubin Urine: NEGATIVE
Glucose, UA: NEGATIVE mg/dL
Ketones, ur: NEGATIVE mg/dL
Nitrite: POSITIVE — AB
Protein, ur: NEGATIVE mg/dL
Specific Gravity, Urine: 1.015 (ref 1.005–1.030)
Urobilinogen, UA: 1 mg/dL (ref 0.0–1.0)
pH: 5.5 (ref 5.0–8.0)

## 2015-05-13 MED ORDER — CEPHALEXIN 500 MG PO CAPS: 500.0000 mg | ORAL_CAPSULE | Freq: Two times a day (BID) | ORAL | Status: DC

## 2015-05-13 NOTE — Discharge Instructions (Signed)
1. Medications: keflex, usual home medications 2. Treatment: rest, drink plenty of fluids 3. Follow Up: please followup with your primary doctor and urologist for discussion of your diagnoses and further evaluation after today's visit; if you do not have a primary care doctor use the resource guide provided to find one; please return to the ER for fever, severe abdominal pain, new or worsening symptoms   Urinary Tract Infection Urinary tract infections (UTIs) can develop anywhere along your urinary tract. Your urinary tract is your body's drainage system for removing wastes and extra water. Your urinary tract includes two kidneys, two ureters, a bladder, and a urethra. Your kidneys are a pair of bean-shaped organs. Each kidney is about the size of your fist. They are located below your ribs, one on each side of your spine. CAUSES Infections are caused by microbes, which are microscopic organisms, including fungi, viruses, and bacteria. These organisms are so small that they can only be seen through a microscope. Bacteria are the microbes that most commonly cause UTIs. SYMPTOMS  Symptoms of UTIs may vary by age and gender of the patient and by the location of the infection. Symptoms in young women typically include a frequent and intense urge to urinate and a painful, burning feeling in the bladder or urethra during urination. Older women and men are more likely to be tired, shaky, and weak and have muscle aches and abdominal pain. A fever may mean the infection is in your kidneys. Other symptoms of a kidney infection include pain in your back or sides below the ribs, nausea, and vomiting. DIAGNOSIS To diagnose a UTI, your caregiver will ask you about your symptoms. Your caregiver also will ask to provide a urine sample. The urine sample will be tested for bacteria and white blood cells. White blood cells are made by your body to help fight infection. TREATMENT  Typically, UTIs can be treated with  medication. Because most UTIs are caused by a bacterial infection, they usually can be treated with the use of antibiotics. The choice of antibiotic and length of treatment depend on your symptoms and the type of bacteria causing your infection. HOME CARE INSTRUCTIONS  If you were prescribed antibiotics, take them exactly as your caregiver instructs you. Finish the medication even if you feel better after you have only taken some of the medication.  Drink enough water and fluids to keep your urine clear or pale yellow.  Avoid caffeine, tea, and carbonated beverages. They tend to irritate your bladder.  Empty your bladder often. Avoid holding urine for long periods of time.  Empty your bladder before and after sexual intercourse.  After a bowel movement, women should cleanse from front to back. Use each tissue only once. SEEK MEDICAL CARE IF:   You have back pain.  You develop a fever.  Your symptoms do not begin to resolve within 3 days. SEEK IMMEDIATE MEDICAL CARE IF:   You have severe back pain or lower abdominal pain.  You develop chills.  You have nausea or vomiting.  You have continued burning or discomfort with urination. MAKE SURE YOU:   Understand these instructions.  Will watch your condition.  Will get help right away if you are not doing well or get worse. Document Released: 06/02/2005 Document Revised: 02/22/2012 Document Reviewed: 10/01/2011 [facility] Patient Information 2015 [place], [place]. This information is not intended to replace advice given to you by your health care provider. Make sure you discuss any questions you have with your health care  provider.

## 2015-05-13 NOTE — ED Notes (Addendum)
Pt has been taking Tramadol recently and already takes Xarelto (recently started this 3 months ago). Says blood is dark red with clots. Found out the Tramadol and Xarelto together can cause increased bleeding. Now having bleeding from penis starting today. Denies dizziness/N/V/D. No other c/c, just says, the bleeding won't stop. Also has a persistent cough-doctor recently changed Lisinopril dosage.

## 2015-05-13 NOTE — ED Provider Notes (Signed)
CSN: [id number]     Arrival date & time 05/13/15  1948 History   First MD Initiated Contact with Patient 05/13/15 2128     Chief Complaint  Patient presents with  . Penile bleeding   . Cough    HPI   Matthew Ochoa. is a 69 y.o. male with a PMH of DM, HTN, COPD, hematuria who presents to the ED with cough and penile bleeding. Reports dry cough x several months. States he was seen by his cardiologist approximately 2 weeks ago, who thought his cough might be due to lisinopril, so he reports he was taken off lisinopril. States he has not noticed improvement in cough since that time. Denies fever, chills. Reports nasal congestion. Denies chest pain, shortness of breath. Denies precipitating factors, has tried cough drops for symptom relief.  Also reports penile bleeding, which has occurred intermittently over the past 2 years. He was seen by his urologist for the same symptoms a few months ago. States he noticed penile bleeding, which began last night after having sexual intercourse with his wife. He states he notices the blood when he urinates as well as on his underwear. He is unable to quantify how much he has been bleeding today. Denies abdominal pain, N/V/D/C, dysuria, urgency. Reports frequency. Denies penile discharge, pain, or swelling.   Past Medical History  Diagnosis Date  . Sleep apnea     NPSG 03-17-98, cpap 14  . Type 2 diabetes mellitus   . Hypertension   . COPD (chronic obstructive pulmonary disease)   . HTN (hypertension)   . Allergic rhinitis   . Abdominal pain, right upper quadrant   . Calculus of gallbladder without mention of cholecystitis or obstruction   . Benign localized hyperplasia of prostate with urinary obstruction and other lower urinary tract symptoms (LUTS)(600.21)   . Hematuria, unspecified   . Diabetes mellitus   . Hyperlipidemia   . Asthma     as child  . Atrial enlargement, left y-2   Past Surgical History  Procedure Laterality Date  . Angioplasty       stent  . Coronary angioplasty with stent placement    . Colonoscopy    . Polypectomy     Family History  Problem Relation Age of Onset  . Asthma Sister   . Lung cancer Father   . Breast cancer Sister   . Colon cancer Neg Hx   . Rectal cancer Neg Hx   . Stomach cancer Neg Hx    Social History  Substance Use Topics  . Smoking status: Former Smoker -- 1.00 packs/day for 30 years    Types: Cigarettes    Quit date: 04/13/2011  . Smokeless tobacco: Never Used  . Alcohol Use: Yes     Comment: Socially    Review of Systems  Constitutional: Negative for fever, chills, activity change, appetite change and fatigue.  HENT: Positive for congestion.   Eyes: Negative for visual disturbance.  Respiratory: Positive for cough. Negative for shortness of breath and wheezing.   Cardiovascular: Negative for chest pain, palpitations and leg swelling.  Gastrointestinal: Negative for nausea, vomiting, abdominal pain, diarrhea, constipation and abdominal distention.  Genitourinary: Positive for frequency. Negative for dysuria, urgency, discharge, penile swelling, scrotal swelling, difficulty urinating, penile pain and testicular pain.       Reports penile bleeding.  Musculoskeletal: Negative for myalgias, back pain, arthralgias, neck pain and neck stiffness.  Skin: Negative for color change, pallor, rash and wound.  Neurological: Negative for dizziness,  syncope, weakness, light-headedness, numbness and headaches.  All other systems reviewed and are negative.     Allergies  Review of patient's allergies indicates no known allergies.  Home Medications   Prior to Admission medications   Medication Sig Start Date End Date Taking? Authorizing Provider  amiodarone (PACERONE) 100 MG tablet Take 100 mg by mouth. 1/2 tablet everyday 07/07/12   Hosie Poisson, MD  amLODipine (NORVASC) 5 MG tablet TAKE 1 TABLET (5 MG TOTAL) BY MOUTH DAILY. 03/19/15   Micheline Chapman, NP  atorvastatin (LIPITOR) 40 MG  tablet Take 1 tablet (40 mg total) by mouth every evening. 03/19/15   Micheline Chapman, NP  ciclopirox (PENLAC) 8 % solution Apply topically at bedtime. Apply over nail and surrounding skin. Apply daily over previous coat. After seven (7) days, may remove with alcohol and continue cycle. 10/30/14   Trula Slade, DPM  finasteride (PROSCAR) 5 MG tablet  10/04/14   Historical Provider, MD  hydrochlorothiazide (HYDRODIURIL) 25 MG tablet TAKE 1 TABLET (25 MG TOTAL) BY MOUTH DAILY. 03/19/15   Micheline Chapman, NP  HYDROcodone-acetaminophen (NORCO) 10-325 MG per tablet Take 1 tablet by mouth every 6 (six) hours as needed. 01/29/15   [name], FNP  lisinopril (PRINIVIL,ZESTRIL) 40 MG tablet TAKE 1 TABLET BY MOUTH EVERY DAY 04/07/15   Micheline Chapman, NP  metFORMIN (GLUCOPHAGE) 500 MG tablet Take 1 tablet (500 mg total) by mouth 3 (three) times daily. 03/19/15   Micheline Chapman, NP  nitroGLYCERIN (NITROSTAT) 0.4 MG SL tablet Place 0.4 mg under the tongue every 5 (five) minutes as needed. For chest pain    Historical Provider, MD  rivaroxaban (XARELTO) 20 MG TABS tablet Take 1 tablet (20 mg total) by mouth daily with supper. 12/18/14   [name], FNP  traMADol (ULTRAM) 50 MG tablet Take 1 tablet (50 mg total) by mouth 3 (three) times daily as needed. 04/10/15   Micheline Chapman, NP    BP 123/76 mmHg  Pulse 65  Temp(Src) 98.1 F (36.7 C) (Oral)  Resp 16  SpO2 95% Physical Exam  Constitutional: He is oriented to person, place, and time. He appears well-developed and well-nourished. No distress.  HENT:  Head: Normocephalic and atraumatic.  Right Ear: External ear normal.  Left Ear: External ear normal.  Nose: Nose normal.  Mouth/Throat: Uvula is midline, oropharynx is clear and moist and mucous membranes are normal.  Eyes: Conjunctivae, EOM and lids are normal. Pupils are equal, round, and reactive to light. Right eye exhibits no discharge. Left eye exhibits no discharge. No scleral  icterus.  Neck: Normal range of motion. Neck supple.  Cardiovascular: Normal rate, regular rhythm, normal heart sounds, intact distal pulses and normal pulses.   Pulmonary/Chest: Effort normal and breath sounds normal. No respiratory distress.  Abdominal: Soft. Normal appearance and bowel sounds are normal. He exhibits no distension and no mass. There is no tenderness. There is no rigidity, no rebound and no guarding.  Genitourinary: Testes normal. Right testis shows no mass, no swelling and no tenderness. Right testis is descended. Left testis shows no mass, no swelling and no tenderness. Left testis is descended. Uncircumcised. No phimosis, paraphimosis, penile erythema or penile tenderness. No discharge found.  Small amount of blood on penis, no active bleeding.  Musculoskeletal: Normal range of motion. He exhibits no edema or tenderness.  Lymphadenopathy:       Right: No inguinal adenopathy present.       Left: No inguinal  adenopathy present.  Neurological: He is alert and oriented to person, place, and time. He has normal strength. No sensory deficit.  Skin: Skin is warm, dry and intact. No rash noted. He is not diaphoretic. No erythema. No pallor.  Psychiatric: He has a normal mood and affect. His speech is normal and behavior is normal. Judgment and thought content normal.  Nursing note and vitals reviewed.   ED Course  Procedures (including critical care time)  Labs Review Labs Reviewed  URINALYSIS, ROUTINE W REFLEX MICROSCOPIC (NOT AT [facility]) - Abnormal; Notable for the following:    APPearance CLOUDY (*)    Hgb urine dipstick LARGE (*)    Nitrite POSITIVE (*)    Leukocytes, UA MODERATE (*)    All other components within normal limits  URINE MICROSCOPIC-ADD ON - Abnormal; Notable for the following:    Bacteria, UA MANY (*)    All other components within normal limits    Imaging Review No results found.   I have personally reviewed and evaluated these lab results as part of  my medical decision-making.   EKG Interpretation None      MDM   Final diagnoses:  UTI (lower urinary tract infection)  Cough  Penile bleeding   69 year old male presents with cough x several months and intermittent penile bleeding x 2 years. Cough unchanged from prior. Latest episode of bleeding occurred yesterday s/p sexual intercourse. Denies fever, chills. Reports nasal congestion. Denies chest pain, shortness of breath. Denies abdominal pain, N/V/D/C, dysuria, urgency. Reports frequency. Denies penile discharge, pain, or swelling.   Patient is afebrile. Vital signs stable. Lungs clear to auscultation bilaterally. Small mount of blood on penis, no active bleeding, no discharge, no TTP.  UA with nitrites and leukocytes, 7-10 WBC on microscopic. Will treat with keflex. Patient presents with several months of cough and recurrence of penile bleeding, which he has experienced previously; do no feel further evaluation with additional labs or imaging is indicated at this time. Patient to follow-up with PCP and urologist. Return precautions discussed.  BP 112/68 mmHg  Pulse 60  Temp(Src) 97.8 F (36.6 C) (Oral)  Resp 18  SpO2 95%        Marella Chimes, PA-C 05/14/15 [place], DO 05/14/15 [id number]

## 2015-05-13 NOTE — ED Notes (Signed)
Pt is a&ox4, ambulatory, denied questions, concerns r/t dc

## 2015-06-02 ENCOUNTER — Telehealth: Payer: Self-pay | Admitting: Family Medicine

## 2015-06-02 NOTE — Telephone Encounter (Signed)
This is ready for patient. Thanks!

## 2015-06-02 NOTE — Telephone Encounter (Signed)
Needs a printout of A1C results from labs taken on 03/19/15. Please call when ready for pick up.

## 2015-07-21 ENCOUNTER — Ambulatory Visit: Payer: Medicare HMO | Admitting: Family Medicine

## 2015-09-26 ENCOUNTER — Other Ambulatory Visit: Payer: Self-pay | Admitting: Family Medicine

## 2015-10-16 ENCOUNTER — Encounter: Payer: Self-pay | Admitting: Gastroenterology

## 2015-10-25 ENCOUNTER — Emergency Department (HOSPITAL_COMMUNITY): Payer: Medicare HMO

## 2015-10-25 ENCOUNTER — Emergency Department (HOSPITAL_BASED_OUTPATIENT_CLINIC_OR_DEPARTMENT_OTHER)
Admit: 2015-10-25 | Discharge: 2015-10-25 | Disposition: A | Discharge status: Home / Self Care | Payer: Medicare HMO | Attending: Emergency Medicine | Admitting: Emergency Medicine

## 2015-10-25 ENCOUNTER — Encounter (HOSPITAL_COMMUNITY): Payer: Self-pay | Admitting: Emergency Medicine

## 2015-10-25 ENCOUNTER — Emergency Department (HOSPITAL_COMMUNITY)
Admission: EM | Admit: 2015-10-25 | Discharge: 2015-10-25 | Disposition: A | Discharge status: Home / Self Care | Payer: Medicare HMO | Attending: Emergency Medicine | Admitting: Emergency Medicine

## 2015-10-25 DIAGNOSIS — Z9861 Coronary angioplasty status: Secondary | ICD-10-CM | POA: Diagnosis not present

## 2015-10-25 DIAGNOSIS — Z7984 Long term (current) use of oral hypoglycemic drugs: Secondary | ICD-10-CM | POA: Insufficient documentation

## 2015-10-25 DIAGNOSIS — I10 Essential (primary) hypertension: Secondary | ICD-10-CM | POA: Insufficient documentation

## 2015-10-25 DIAGNOSIS — R079 Chest pain, unspecified: Secondary | ICD-10-CM | POA: Diagnosis not present

## 2015-10-25 DIAGNOSIS — Z9981 Dependence on supplemental oxygen: Secondary | ICD-10-CM | POA: Diagnosis not present

## 2015-10-25 DIAGNOSIS — Z87891 Personal history of nicotine dependence: Secondary | ICD-10-CM | POA: Diagnosis not present

## 2015-10-25 DIAGNOSIS — Z8719 Personal history of other diseases of the digestive system: Secondary | ICD-10-CM | POA: Diagnosis not present

## 2015-10-25 DIAGNOSIS — Z87438 Personal history of other diseases of male genital organs: Secondary | ICD-10-CM | POA: Insufficient documentation

## 2015-10-25 DIAGNOSIS — Z7901 Long term (current) use of anticoagulants: Secondary | ICD-10-CM | POA: Diagnosis not present

## 2015-10-25 DIAGNOSIS — E119 Type 2 diabetes mellitus without complications: Secondary | ICD-10-CM | POA: Diagnosis not present

## 2015-10-25 DIAGNOSIS — M79662 Pain in left lower leg: Secondary | ICD-10-CM | POA: Diagnosis not present

## 2015-10-25 DIAGNOSIS — M79605 Pain in left leg: Secondary | ICD-10-CM | POA: Diagnosis not present

## 2015-10-25 DIAGNOSIS — G4730 Sleep apnea, unspecified: Secondary | ICD-10-CM | POA: Diagnosis not present

## 2015-10-25 DIAGNOSIS — E785 Hyperlipidemia, unspecified: Secondary | ICD-10-CM | POA: Diagnosis not present

## 2015-10-25 DIAGNOSIS — I2699 Other pulmonary embolism without acute cor pulmonale: Secondary | ICD-10-CM

## 2015-10-25 DIAGNOSIS — J449 Chronic obstructive pulmonary disease, unspecified: Secondary | ICD-10-CM | POA: Diagnosis not present

## 2015-10-25 DIAGNOSIS — Z86711 Personal history of pulmonary embolism: Secondary | ICD-10-CM

## 2015-10-25 LAB — CBC WITH DIFFERENTIAL/PLATELET
Basophils Absolute: 0 10*3/uL (ref 0.0–0.1)
Basophils Relative: 0 %
Eosinophils Absolute: 0.4 10*3/uL (ref 0.0–0.7)
Eosinophils Relative: 6 %
HCT: 37.1 % — ABNORMAL LOW (ref 39.0–52.0)
Hemoglobin: 13.3 g/dL (ref 13.0–17.0)
Lymphocytes Relative: 24 %
Lymphs Abs: 1.9 10*3/uL (ref 0.7–4.0)
MCH: 28.2 pg (ref 26.0–34.0)
MCHC: 35.8 g/dL (ref 30.0–36.0)
MCV: 78.8 fL (ref 78.0–100.0)
Monocytes Absolute: 0.5 10*3/uL (ref 0.1–1.0)
Monocytes Relative: 6 %
Neutro Abs: 4.9 10*3/uL (ref 1.7–7.7)
Neutrophils Relative %: 64 %
Platelets: 147 10*3/uL — ABNORMAL LOW (ref 150–400)
RBC: 4.71 MIL/uL (ref 4.22–5.81)
RDW: 16.1 % — ABNORMAL HIGH (ref 11.5–15.5)
WBC: 7.7 10*3/uL (ref 4.0–10.5)

## 2015-10-25 LAB — BASIC METABOLIC PANEL
Anion gap: 8 (ref 5–15)
BUN: 16 mg/dL (ref 6–20)
CO2: 25 mmol/L (ref 22–32)
Calcium: 9.6 mg/dL (ref 8.9–10.3)
Chloride: 108 mmol/L (ref 101–111)
Creatinine, Ser: 1.13 mg/dL (ref 0.61–1.24)
GFR calc Af Amer: >60 mL/min (ref >60)
GFR calc non Af Amer: >60 mL/min (ref >60)
Glucose, Bld: 145 mg/dL — ABNORMAL HIGH (ref 65–99)
Potassium: 3.7 mmol/L (ref 3.5–5.1)
Sodium: 141 mmol/L (ref 135–145)

## 2015-10-25 LAB — TROPONIN I: Troponin I: <0.03 ng/mL (ref <0.031)

## 2015-10-25 MED ORDER — IOHEXOL 350 MG/ML SOLN
100.0000 mL | Freq: Once | INTRAVENOUS | Status: AC | PRN
  Administered 2015-10-25: 100 mL via INTRAVENOUS

## 2015-10-25 NOTE — ED Notes (Addendum)
Pt reported intermittent lt leg pain for months and has not seen PMD at this time. Denies injury/truama. (+)PMS, CRT brisk, no bruising/swelling/deformity noted.

## 2015-10-25 NOTE — ED Notes (Addendum)
Pt taken to x-ray and returned to room without distress noted. 

## 2015-10-25 NOTE — Discharge Instructions (Signed)
Nonspecific Chest Pain  °Chest pain can be caused by many different conditions. There is always a chance that your pain could be related to something serious, such as a heart attack or a blood clot in your lungs. Chest pain can also be caused by conditions that are not life-threatening. If you have chest pain, it is very important to follow up with your health care provider. °CAUSES  °Chest pain can be caused by: °· Heartburn. °· Pneumonia or bronchitis. °· Anxiety or stress. °· Inflammation around your heart (pericarditis) or lung (pleuritis or pleurisy). °· A blood clot in your lung. °· A collapsed lung (pneumothorax). It can develop suddenly on its own (spontaneous pneumothorax) or from trauma to the chest. °· Shingles infection (varicella-zoster virus). °· Heart attack. °· Damage to the bones, muscles, and cartilage that make up your chest wall. This can include: °¨ Bruised bones due to injury. °¨ Strained muscles or cartilage due to frequent or repeated coughing or overwork. °¨ Fracture to one or more ribs. °¨ Sore cartilage due to inflammation (costochondritis). °RISK FACTORS  °Risk factors for chest pain may include: °· Activities that increase your risk for trauma or injury to your chest. °· Respiratory infections or conditions that cause frequent coughing. °· Medical conditions or overeating that can cause heartburn. °· Heart disease or family history of heart disease. °· Conditions or health behaviors that increase your risk of developing a blood clot. °· Having had chicken pox (varicella zoster). °SIGNS AND SYMPTOMS °Chest pain can feel like: °· Burning or tingling on the surface of your chest or deep in your chest. °· Crushing, pressure, aching, or squeezing pain. °· Dull or sharp pain that is worse when you move, cough, or take a deep breath. °· Pain that is also felt in your back, neck, shoulder, or arm, or pain that spreads to any of these areas. °Your chest pain may come and go, or it may stay  constant. °DIAGNOSIS °Lab tests or other studies may be needed to find the cause of your pain. Your health care provider may have you take a test called an ambulatory ECG (electrocardiogram). An ECG records your heartbeat patterns at the time the test is performed. You may also have other tests, such as: °· Transthoracic echocardiogram (TTE). During echocardiography, sound waves are used to create a picture of all of the heart structures and to look at how blood flows through your heart. °· Transesophageal echocardiogram (TEE). This is a more advanced imaging test that obtains images from inside your body. It allows your health care provider to see your heart in finer detail. °· Cardiac monitoring. This allows your health care provider to monitor your heart rate and rhythm in real time. °· Holter monitor. This is a portable device that records your heartbeat and can help to diagnose abnormal heartbeats. It allows your health care provider to track your heart activity for several days, if needed. °· Stress tests. These can be done through exercise or by taking medicine that makes your heart beat more quickly. °· Blood tests. °· Imaging tests. °TREATMENT  °Your treatment depends on what is causing your chest pain. Treatment may include: °· Medicines. These may include: °¨ Acid blockers for heartburn. °¨ Anti-inflammatory medicine. °¨ Pain medicine for inflammatory conditions. °¨ Antibiotic medicine, if an infection is present. °¨ Medicines to dissolve blood clots. °¨ Medicines to treat coronary artery disease. °· Supportive care for conditions that do not require medicines. This may include: °¨ Resting. °¨ Applying heat   or cold packs to injured areas. °¨ Limiting activities until pain decreases. °HOME CARE INSTRUCTIONS °· If you were prescribed an antibiotic medicine, finish it all even if you start to feel better. °· Avoid any activities that bring on chest pain. °· Do not use any tobacco products, including  cigarettes, chewing tobacco, or electronic cigarettes. If you need help quitting, ask your health care provider. °· Do not drink alcohol. °· Take medicines only as directed by your health care provider. °· Keep all follow-up visits as directed by your health care provider. This is important. This includes any further testing if your chest pain does not go away. °· If heartburn is the cause for your chest pain, you may be told to keep your head raised (elevated) while sleeping. This reduces the chance that acid will go from your stomach into your esophagus. °· Make lifestyle changes as directed by your health care provider. These may include: °¨ Getting regular exercise. Ask your health care provider to suggest some activities that are safe for you. °¨ Eating a heart-healthy diet. A registered dietitian can help you to learn healthy eating options. °¨ Maintaining a healthy weight. °¨ Managing diabetes, if necessary. °¨ Reducing stress. °SEEK MEDICAL CARE IF: °· Your chest pain does not go away after treatment. °· You have a rash with blisters on your chest. °· You have a fever. °SEEK IMMEDIATE MEDICAL CARE IF:  °· Your chest pain is worse. °· You have an increasing cough, or you cough up blood. °· You have severe abdominal pain. °· You have severe weakness. °· You faint. °· You have chills. °· You have sudden, unexplained chest discomfort. °· You have sudden, unexplained discomfort in your arms, back, neck, or jaw. °· You have shortness of breath at any time. °· You suddenly start to sweat, or your skin gets clammy. °· You feel nauseous or you vomit. °· You suddenly feel light-headed or dizzy. °· Your heart begins to beat quickly, or it feels like it is skipping beats. °These symptoms may represent a serious problem that is an emergency. Do not wait to see if the symptoms will go away. Get medical help right away. Call your local emergency services (911 in the [place] Do not drive yourself to the hospital. °  °This  information is not intended to replace advice given to you by your health care provider. Make sure you discuss any questions you have with your health care provider. °  °Document Released: 06/02/2005 Document Revised: 09/13/2014 Document Reviewed: 03/29/2014 °Elsevier Interactive Patient Education ©2016 [employer] ° °

## 2015-10-25 NOTE — ED Provider Notes (Signed)
CSN: [id number]     Arrival date & time 10/25/15  0902 History   First MD Initiated Contact with Patient 10/25/15 [phone]     Chief Complaint  Patient presents with  . Leg Pain     (Consider location/radiation/quality/duration/timing/severity/associated sxs/prior Treatment) HPI Patient has been getting a discomfort in his left lower extremity that has been coming and going possibly for months. Sometimes he notices it in his calf and at other times the front of the thigh. At times it resolves with position change. He has not specifically identified swelling. There is been no specific injury. Patient was concerned for blood clot as he has history of pulmonary embolus. Patient takes Xarelto. Patient has also noted intermittent CP. This lasts a few seconds then resolves w/o  Associated symptoms. No exertional CP or dyspnea. No fever, cough or chills. He has follow up appointment with Dr.Harwni in 2 days.  Past Medical History  Diagnosis Date  . Sleep apnea     NPSG 03-17-98, cpap 14  . Type 2 diabetes mellitus ([place])   . Hypertension   . COPD (chronic obstructive pulmonary disease) ([place])   . HTN (hypertension)   . Allergic rhinitis   . Abdominal pain, right upper quadrant   . Calculus of gallbladder without mention of cholecystitis or obstruction   . Benign localized hyperplasia of prostate with urinary obstruction and other lower urinary tract symptoms (LUTS)(600.21)   . Hematuria, unspecified   . Diabetes mellitus   . Hyperlipidemia   . Asthma     as child  . Atrial enlargement, left y-2   Past Surgical History  Procedure Laterality Date  . Angioplasty      stent  . Coronary angioplasty with stent placement    . Colonoscopy    . Polypectomy     Family History  Problem Relation Age of Onset  . Asthma Sister   . Lung cancer Father   . Breast cancer Sister   . Colon cancer Neg Hx   . Rectal cancer Neg Hx   . Stomach cancer Neg Hx    Social History  Substance Use Topics  .  Smoking status: Former Smoker -- 1.00 packs/day for 30 years    Types: Cigarettes    Quit date: 04/13/2011  . Smokeless tobacco: Never Used  . Alcohol Use: Yes     Comment: Socially    Review of Systems 10 Systems reviewed and are negative for acute change except as noted in the HPI.   Allergies  Lisinopril  Home Medications   Prior to Admission medications   Medication Sig Start Date End Date Taking? Authorizing Provider  amiodarone (PACERONE) 200 MG tablet Take 100 mg by mouth every Monday, Wednesday, and Friday.  02/22/15  Yes Historical Provider, MD  amLODipine (NORVASC) 5 MG tablet TAKE 1 TABLET (5 MG TOTAL) BY MOUTH DAILY. 03/19/15  Yes Micheline Chapman, NP  atorvastatin (LIPITOR) 40 MG tablet Take 1 tablet (40 mg total) by mouth every evening. 03/19/15  Yes Micheline Chapman, NP  Azilsartan Medoxomil (EDARBI) 40 MG TABS Take 40 mg by mouth daily.   Yes Historical Provider, MD  finasteride (PROSCAR) 5 MG tablet Take 5 mg by mouth daily.  10/04/14  Yes Historical Provider, MD  hydrochlorothiazide (HYDRODIURIL) 25 MG tablet TAKE 1 TABLET BY MOUTH DAILY 09/26/15  Yes Micheline Chapman, NP  metFORMIN (GLUCOPHAGE) 500 MG tablet Take 1 tablet (500 mg total) by mouth 3 (three) times daily. 03/19/15  Yes Glynis Smiles  Bernhardt, NP  nitroGLYCERIN (NITROSTAT) 0.4 MG SL tablet Place 0.4 mg under the tongue every 5 (five) minutes as needed. For chest pain   Yes Historical Provider, MD  rivaroxaban (XARELTO) 20 MG TABS tablet Take 1 tablet (20 mg total) by mouth daily with supper. 12/18/14  Yes [name], FNP  ciclopirox (PENLAC) 8 % solution Apply topically at bedtime. Apply over nail and surrounding skin. Apply daily over previous coat. After seven (7) days, may remove with alcohol and continue cycle. Patient not taking: Reported on 05/13/2015 10/30/14   Trula Slade, DPM  lisinopril (PRINIVIL,ZESTRIL) 40 MG tablet TAKE 1 TABLET BY MOUTH EVERY DAY Patient not taking: Reported on 05/13/2015  04/07/15   Micheline Chapman, NP   BP 114/76 mmHg  Pulse 47  Temp(Src) 98.4 F (36.9 C) (Oral)  Resp 16  Ht 5\' 11"  (1.803 m)  Wt 275 lb (124.739 kg)  BMI 38.37 kg/m2  SpO2 96% Physical Exam  Constitutional: He is oriented to person, place, and time. He appears well-developed and well-nourished.  HENT:  Head: Normocephalic and atraumatic.  Eyes: EOM are normal. Pupils are equal, round, and reactive to light.  Neck: Neck supple.  Cardiovascular: Normal rate, regular rhythm, normal heart sounds and intact distal pulses.   Pulmonary/Chest: Effort normal and breath sounds normal.  Abdominal: Soft. Bowel sounds are normal. He exhibits no distension. There is no tenderness.  Musculoskeletal: Normal range of motion. He exhibits no edema or tenderness.  No calf tenderness. No soft tissue swelling. Distal pulses are 2+ and symmetric. No edema and no cellulitis.  Neurological: He is alert and oriented to person, place, and time. He has normal strength. Coordination normal. GCS eye subscore is 4. GCS verbal subscore is 5. GCS motor subscore is 6.  Skin: Skin is warm, dry and intact.  Psychiatric: He has a normal mood and affect.    ED Course  Procedures (including critical care time) Labs Review Labs Reviewed  BASIC METABOLIC PANEL - Abnormal; Notable for the following:    Glucose, Bld 145 (*)    All other components within normal limits  CBC WITH DIFFERENTIAL/PLATELET - Abnormal; Notable for the following:    HCT 37.1 (*)    RDW 16.1 (*)    Platelets 147 (*)    All other components within normal limits  TROPONIN I    Imaging Review Dg Chest 2 View  10/25/2015  CLINICAL DATA:  70 year old male with left anterior chest pain and left lower extremities soreness EXAM: CHEST  2 VIEW COMPARISON:  If prior chest x-ray 08/11/2012 FINDINGS: Unchanged appearance of the chest. Cardiac and mediastinal contours are within normal limits. Chronic bronchitic change and interstitial prominence without  significant interval progression. No focal airspace consolidation, pleural effusion, pneumothorax or pulmonary edema. No acute osseous abnormality. IMPRESSION: Stable chest x-ray without evidence of acute cardiopulmonary process. Electronically Signed   By: Jacqulynn Cadet M.D.   On: 10/25/2015 10:57   Ct Angio Chest Pe W/cm &/or Wo Cm  10/25/2015  CLINICAL DATA:  Acute chest pain with left leg pain, history of previous bilateral pulmonary emboli 07/01/2012 EXAM: CT ANGIOGRAPHY CHEST WITH CONTRAST TECHNIQUE: Multidetector CT imaging of the chest was performed using the standard protocol during bolus administration of intravenous contrast. Multiplanar CT image reconstructions and MIPs were obtained to evaluate the vascular anatomy. CONTRAST:  186mL OMNIPAQUE IOHEXOL 350 MG/ML SOLN COMPARISON:  07/01/2012 FINDINGS: No significant central or proximal hilar filling defect or pulmonary embolus demonstrated by CTA.  Limited assessment of the smaller segmental and subsegmental branches to exclude small emboli. Calcific atherosclerosis and mild ectasia of the thoracic aorta. Major branch vessels are also ectatic and tortuous. No acute dissection. Coronary atherosclerosis noted. Stable cardiomegaly. No pericardial or pleural effusion. No adenopathy. Calcified left prevascular and AP window lymph nodes noted. Lung windows demonstrate no focal pneumonia, collapse or consolidation. No edema or interstitial process. Chronic parenchymal scarring in the peripheral right upper lobe. Subsegmental atelectasis versus scarring in the inferior right middle lobe and lingula. Trachea and central airways are patent. No pleural abnormality or pneumothorax. Included upper abdomen demonstrates splenic granulomata. Stable hypodense cyst in the right kidney upper pole measuring 2 cm. No acute upper abdominal process. Abdominal atherosclerosis evident. Degenerative changes of the spine. No acute osseous finding or compression fracture.  Sternum intact. Review of the MIP images confirms the above findings. IMPRESSION: No significant central or proximal hilar acute pulmonary embolus by CTA. Limited assessment of the smaller segmental and subsegmental branches as above. Thoracic aortic atherosclerosis and mild ectasia without dissection or significant aneurysm. Native coronary atherosclerosis Cardiomegaly without edema or CHF Scattered parenchymal scarring and subsegmental atelectasis Electronically Signed   By: Jerilynn Mages.  Shick M.D.   On: 10/25/2015 14:05   I have personally reviewed and evaluated these images and lab results as part of my medical decision-making.   EKG Interpretation   Date/Time:  Saturday October 25 2015 10:26:36 EST Ventricular Rate:  47 PR Interval:  183 QRS Duration: 102 QT Interval:  436 QTC Calculation: 385 R Axis:   -53 Text Interpretation:  Sinus bradycardia Inferior infarct, old Probable  anterior infarct, age indeterminate agree. no sig change from old.  Confirmed by Johnney Killian, MD, Jeannie Done [phone]) on 10/25/2015 11:48:39 AM      MDM   Final diagnoses:  Pain of left lower extremity  Chest pain, unspecified chest pain type  History of pulmonary embolus (PE)   Patient presents for concern for lower extremity DVT. Does Not Show Any Acute Deep Clots, small, non-acute thrombus was identified in the peroneal vein by ultrasound. CT PE study does not show any pulmonary embolus. Patient is well in appearance. He is alert and nontoxic. He is getting very brief episodes of chest pain which do not sound cardiac ischemic in nature. Vital signs are stable. He has follow-up on Monday with Dr.Harwani. Patient is taking Xarelto. At this point, I feel he is safe for discharge and follow-up as scheduled.    Charlesetta Shanks, MD 10/25/15 [phone]

## 2015-10-25 NOTE — Progress Notes (Signed)
Preliminary results by tech - Left Lower Ext. Venous Completed. Negative for acute deep vein thrombosis in the left leg. There is a small amount of residual thrombosis in the peroneal veins. Results given to patient's nurse. Oda Cogan, BS, RDMS, RVT

## 2015-10-25 NOTE — ED Notes (Signed)
Pt reports continued intermittent left leg pain; hx of blood clots.

## 2015-11-24 ENCOUNTER — Telehealth: Payer: Self-pay

## 2015-11-24 NOTE — Telephone Encounter (Signed)
Received authorization fax today from [facility] for release of medical records. Forwarded to [employer].

## 2016-01-01 ENCOUNTER — Other Ambulatory Visit: Payer: Self-pay | Admitting: Family Medicine

## 2016-02-19 ENCOUNTER — Other Ambulatory Visit: Payer: Self-pay | Admitting: Family Medicine

## 2016-04-09 ENCOUNTER — Other Ambulatory Visit: Payer: Self-pay

## 2016-04-09 MED ORDER — HYDROCHLOROTHIAZIDE 25 MG PO TABS: 25.0000 mg | ORAL_TABLET | Freq: Every day | ORAL | 1 refills | Status: DC

## 2016-04-09 NOTE — Telephone Encounter (Signed)
Refill for hctz sent into pharmacy. Thanks!  

## 2016-05-14 ENCOUNTER — Telehealth: Payer: Self-pay | Admitting: Internal Medicine

## 2016-05-14 NOTE — Telephone Encounter (Signed)
Called and spoke pt. Pt states his CPAP is no longer working. Advised pt to contact his DME company as it may be fixable. Pt has not been seen in over 3 years by [facility], sleep consult scheduled with Dr. Elsworth Soho on 06/09/2016. Pt verbalized understanding and denied any further questions or concerns at this time.

## 2016-05-14 NOTE — Telephone Encounter (Signed)
Pt last seen 12/2012- cpap machine is not working.  Pt uses [facility].  Pt has not yet called [facility], requesting further recs from our office.

## 2016-05-24 ENCOUNTER — Other Ambulatory Visit: Payer: Self-pay | Admitting: Family Medicine

## 2016-06-05 ENCOUNTER — Other Ambulatory Visit: Payer: Self-pay | Admitting: Family Medicine

## 2016-06-09 ENCOUNTER — Institutional Professional Consult (permissible substitution): Payer: Medicare HMO | Admitting: Pulmonary Disease

## 2016-07-06 ENCOUNTER — Other Ambulatory Visit: Payer: Self-pay

## 2016-07-06 MED ORDER — AMLODIPINE BESYLATE 5 MG PO TABS: 5.0000 mg | ORAL_TABLET | Freq: Every day | ORAL | 2 refills | Status: AC

## 2016-07-26 ENCOUNTER — Other Ambulatory Visit: Payer: Self-pay

## 2016-07-26 MED ORDER — ATORVASTATIN CALCIUM 40 MG PO TABS: ORAL_TABLET | ORAL | 3 refills | Status: AC

## 2016-07-26 NOTE — Telephone Encounter (Signed)
Refill for atorvastatin sent into pharmacy. Thanks!  

## 2016-08-10 ENCOUNTER — Institutional Professional Consult (permissible substitution): Payer: Medicare HMO | Admitting: Pulmonary Disease

## 2016-10-22 DIAGNOSIS — H2513 Age-related nuclear cataract, bilateral: Secondary | ICD-10-CM | POA: Diagnosis not present

## 2016-10-22 DIAGNOSIS — E119 Type 2 diabetes mellitus without complications: Secondary | ICD-10-CM | POA: Diagnosis not present

## 2016-10-22 DIAGNOSIS — H40023 Open angle with borderline findings, high risk, bilateral: Secondary | ICD-10-CM | POA: Diagnosis not present

## 2016-10-22 DIAGNOSIS — H25013 Cortical age-related cataract, bilateral: Secondary | ICD-10-CM | POA: Diagnosis not present

## 2016-11-05 DIAGNOSIS — M542 Cervicalgia: Secondary | ICD-10-CM | POA: Diagnosis not present

## 2016-11-08 DIAGNOSIS — M542 Cervicalgia: Secondary | ICD-10-CM | POA: Diagnosis not present

## 2016-11-11 ENCOUNTER — Ambulatory Visit
Admission: RE | Admit: 2016-11-11 | Discharge: 2016-11-11 | Disposition: A | Discharge status: Home / Self Care | Payer: Medicare Other | Source: Ambulatory Visit | Attending: Internal Medicine | Admitting: Internal Medicine

## 2016-11-11 ENCOUNTER — Other Ambulatory Visit: Payer: Self-pay | Admitting: Internal Medicine

## 2016-11-11 DIAGNOSIS — M542 Cervicalgia: Secondary | ICD-10-CM | POA: Diagnosis not present

## 2016-11-30 DIAGNOSIS — I509 Heart failure, unspecified: Secondary | ICD-10-CM | POA: Diagnosis not present

## 2016-11-30 DIAGNOSIS — E663 Overweight: Secondary | ICD-10-CM | POA: Diagnosis not present

## 2016-11-30 DIAGNOSIS — E7800 Pure hypercholesterolemia, unspecified: Secondary | ICD-10-CM | POA: Diagnosis not present

## 2016-11-30 DIAGNOSIS — G4730 Sleep apnea, unspecified: Secondary | ICD-10-CM | POA: Diagnosis not present

## 2016-11-30 DIAGNOSIS — Z125 Encounter for screening for malignant neoplasm of prostate: Secondary | ICD-10-CM | POA: Diagnosis not present

## 2016-11-30 DIAGNOSIS — Z0000 Encounter for general adult medical examination without abnormal findings: Secondary | ICD-10-CM | POA: Diagnosis not present

## 2016-11-30 DIAGNOSIS — Z6841 Body Mass Index (BMI) 40.0 and over, adult: Secondary | ICD-10-CM | POA: Diagnosis not present

## 2016-11-30 DIAGNOSIS — Z7984 Long term (current) use of oral hypoglycemic drugs: Secondary | ICD-10-CM | POA: Diagnosis not present

## 2016-11-30 DIAGNOSIS — I10 Essential (primary) hypertension: Secondary | ICD-10-CM | POA: Diagnosis not present

## 2016-11-30 DIAGNOSIS — Z23 Encounter for immunization: Secondary | ICD-10-CM | POA: Diagnosis not present

## 2016-11-30 DIAGNOSIS — E119 Type 2 diabetes mellitus without complications: Secondary | ICD-10-CM | POA: Diagnosis not present

## 2016-11-30 DIAGNOSIS — I2699 Other pulmonary embolism without acute cor pulmonale: Secondary | ICD-10-CM | POA: Diagnosis not present

## 2016-11-30 DIAGNOSIS — Z1389 Encounter for screening for other disorder: Secondary | ICD-10-CM | POA: Diagnosis not present

## 2016-11-30 DIAGNOSIS — N400 Enlarged prostate without lower urinary tract symptoms: Secondary | ICD-10-CM | POA: Diagnosis not present

## 2016-12-10 DIAGNOSIS — I5032 Chronic diastolic (congestive) heart failure: Secondary | ICD-10-CM | POA: Diagnosis not present

## 2016-12-10 DIAGNOSIS — J449 Chronic obstructive pulmonary disease, unspecified: Secondary | ICD-10-CM | POA: Insufficient documentation

## 2016-12-10 DIAGNOSIS — E119 Type 2 diabetes mellitus without complications: Secondary | ICD-10-CM | POA: Insufficient documentation

## 2016-12-10 DIAGNOSIS — Z7984 Long term (current) use of oral hypoglycemic drugs: Secondary | ICD-10-CM | POA: Diagnosis not present

## 2016-12-10 DIAGNOSIS — I110 Hypertensive heart disease with heart failure: Secondary | ICD-10-CM | POA: Insufficient documentation

## 2016-12-10 DIAGNOSIS — Z7901 Long term (current) use of anticoagulants: Secondary | ICD-10-CM | POA: Insufficient documentation

## 2016-12-10 DIAGNOSIS — Z955 Presence of coronary angioplasty implant and graft: Secondary | ICD-10-CM | POA: Diagnosis not present

## 2016-12-10 DIAGNOSIS — M79604 Pain in right leg: Secondary | ICD-10-CM | POA: Insufficient documentation

## 2016-12-10 DIAGNOSIS — I2510 Atherosclerotic heart disease of native coronary artery without angina pectoris: Secondary | ICD-10-CM | POA: Diagnosis not present

## 2016-12-10 DIAGNOSIS — Z87891 Personal history of nicotine dependence: Secondary | ICD-10-CM | POA: Insufficient documentation

## 2016-12-11 ENCOUNTER — Encounter (HOSPITAL_COMMUNITY): Payer: Self-pay

## 2016-12-11 ENCOUNTER — Emergency Department (HOSPITAL_COMMUNITY)
Admission: EM | Admit: 2016-12-11 | Discharge: 2016-12-11 | Disposition: A | Discharge status: Home / Self Care | Payer: Medicare Other | Attending: Emergency Medicine | Admitting: Emergency Medicine

## 2016-12-11 DIAGNOSIS — M79604 Pain in right leg: Secondary | ICD-10-CM

## 2016-12-11 HISTORY — DX: Other pulmonary embolism without acute cor pulmonale: I26.99

## 2016-12-11 HISTORY — DX: Acute embolism and thrombosis of unspecified deep veins of unspecified lower extremity: I82.409

## 2016-12-11 NOTE — ED Triage Notes (Addendum)
Pt reports right thigh/hip pain and hot to touch that worsens while walking. Pt denies recent traumatic injury or fall. Pt reports hx of DVTs and is currently on anticoagulants. Pt A+OX4, ambulatory to triage, speaking in complete sentences.

## 2016-12-11 NOTE — ED Notes (Signed)
Pt independently ambulatory w steady gait

## 2016-12-11 NOTE — ED Provider Notes (Signed)
[facility] DEPT Provider Note   CSN: [id number] Arrival date & time: 12/10/16  2356   By signing my name below, I, Matthew Ochoa, attest that this documentation has been prepared under the direction and in the presence of Matthew Lange, PA-C. Electronically Signed: Neta Ochoa, ED Scribe. 12/11/2016. 1:35 AM.   History   Chief Complaint Chief Complaint  Patient presents with  . Leg Pain    Right   The history is provided by the patient. No language interpreter was used.   HPI Comments:  Matthew Ochoa. is a 71 y.o. male who presents to the Emergency Department complaining of constant right leg pain x 1 day. He states that the pain is primarily on the back of his right leg. Pt reports that when he was going to sleep last night his right leg felt warm. Pt had a DVT in the left leg 5 years ago. Pt takes Xarelto regularly. No alleviating factors noted. Denies numbness.    Past Medical History:  Diagnosis Date  . Abdominal pain, right upper quadrant   . Allergic rhinitis   . Asthma    as child  . Atrial enlargement, left y-2  . Benign localized hyperplasia of prostate with urinary obstruction and other lower urinary tract symptoms (LUTS)(600.21)   . Calculus of gallbladder without mention of cholecystitis or obstruction   . COPD (chronic obstructive pulmonary disease) ([place])   . Diabetes mellitus   . DVT (deep venous thrombosis) ([place])   . Hematuria, unspecified   . HTN (hypertension)   . Hyperlipidemia   . Hypertension   . Pulmonary embolism ([place])   . Sleep apnea    NPSG 03-17-98, cpap 14  . Type 2 diabetes mellitus [facility])     Patient Active Problem List   Diagnosis Date Noted  . Toenail fungus 09/26/2014  . Chronic anticoagulation 05/21/2013  . Diabetes mellitus type 2 with complications ([place]) 86/76/7209  . Patellar tendinitis 02/20/2013  . Knee strain 02/20/2013  . HTN (hypertension), malignant 02/20/2013  . Frozen shoulder syndrome 02/20/2013  .  Degenerative joint disease of acromioclavicular joint 02/20/2013  . Chondromalacia of patella 01/24/2013  . Other and unspecified hyperlipidemia 01/24/2013  . Pulmonary embolus ([place]) 01/24/2013  . CAD (coronary artery disease) of artery bypass graft 01/24/2013  . Chronic diastolic CHF (congestive heart failure), NYHA class 1 ([place]) 01/24/2013  . Pulmonary embolism ([place]) 07/02/2012  . Atrial tachycardia ([place]) 07/02/2012  . Hyperlipidemia 07/02/2012  . CAD (coronary artery disease) 07/02/2012  . Cholelithiasis 12/27/2011  . COPD with emphysema ([place]) 09/05/2011  . Dyspnea on exertion 08/01/2011  . Diabetes mellitus type 2, controlled ([place]) 02/04/2008  . OBSTRUCTIVE SLEEP APNEA 01/22/2008  . Essential hypertension 01/22/2008  . ALLERGIC RHINITIS 01/22/2008    Past Surgical History:  Procedure Laterality Date  . ANGIOPLASTY     stent  . COLONOSCOPY    . CORONARY ANGIOPLASTY WITH STENT PLACEMENT    . POLYPECTOMY         Home Medications    Prior to Admission medications   Medication Sig Start Date End Date Taking? Authorizing Provider  amiodarone (PACERONE) 200 MG tablet Take 100 mg by mouth every Monday, Wednesday, and Friday.  02/22/15   Historical Provider, MD  amLODipine (NORVASC) 5 MG tablet Take 1 tablet (5 mg total) by mouth daily. 07/06/16   Micheline Chapman, NP  atorvastatin (LIPITOR) 40 MG tablet TAKE 1 TABLET( 40 MG TOTAL) BY MOUTH EVERY EVENING 07/26/16   Glynis Smiles  Bernhardt, NP  Azilsartan Medoxomil (EDARBI) 40 MG TABS Take 40 mg by mouth daily.    Historical Provider, MD  ciclopirox (PENLAC) 8 % solution Apply topically at bedtime. Apply over nail and surrounding skin. Apply daily over previous coat. After seven (7) days, may remove with alcohol and continue cycle. Patient not taking: Reported on 05/13/2015 10/30/14   Trula Slade, DPM  finasteride (PROSCAR) 5 MG tablet Take 5 mg by mouth daily.  10/04/14   Historical Provider, MD  hydrochlorothiazide (HYDRODIURIL) 25  MG tablet Take 1 tablet (25 mg total) by mouth daily. 04/09/16   Micheline Chapman, NP  lisinopril (PRINIVIL,ZESTRIL) 40 MG tablet TAKE 1 TABLET BY MOUTH EVERY DAY Patient not taking: Reported on 05/13/2015 04/07/15   Micheline Chapman, NP  metFORMIN (GLUCOPHAGE) 500 MG tablet Take 1 tablet (500 mg total) by mouth 3 (three) times daily. 03/19/15   Micheline Chapman, NP  metFORMIN (GLUCOPHAGE) 500 MG tablet TAKE 1 TABLET BY MOUTH THREE TIMES DAILY. 05/24/16   Micheline Chapman, NP  nitroGLYCERIN (NITROSTAT) 0.4 MG SL tablet Place 0.4 mg under the tongue every 5 (five) minutes as needed. For chest pain    Historical Provider, MD  rivaroxaban (XARELTO) 20 MG TABS tablet Take 1 tablet (20 mg total) by mouth daily with supper. 12/18/14   [name], FNP    Family History Family History  Problem Relation Age of Onset  . Lung cancer Father   . Asthma Sister   . Breast cancer Sister   . Colon cancer Neg Hx   . Rectal cancer Neg Hx   . Stomach cancer Neg Hx     Social History Social History  Substance Use Topics  . Smoking status: Former Smoker    Packs/day: 1.00    Years: 30.00    Types: Cigarettes    Quit date: 04/13/2011  . Smokeless tobacco: Never Used  . Alcohol use Yes     Comment: Socially     Allergies   Lisinopril   Review of Systems Review of Systems  Respiratory: Negative for shortness of breath.   Cardiovascular: Negative for chest pain and leg swelling.  Musculoskeletal: Positive for myalgias.  Skin: Negative for color change.  Neurological: Negative for numbness.     Physical Exam Updated Vital Signs BP (!) 133/93 (BP Location: Left Arm)   Pulse 60   Temp 98.2 F (36.8 C) (Oral)   Resp 18   Ht 5\' 11"  (1.803 m)   Wt 281 lb 8.4 oz (127.7 kg)   SpO2 96%   BMI 39.27 kg/m   Physical Exam  Constitutional: He appears well-developed and well-nourished.  HENT:  Head: Normocephalic.  Neck: Normal range of motion. Neck supple.  Cardiovascular: Normal rate and  intact distal pulses.   Pulmonary/Chest: Effort normal. No respiratory distress.  Musculoskeletal:  Right leg has no swelling or discoloration. No calf or thigh tenderness. No lumbar or paralumbar tenderness. FROM     ED Treatments / Results  DIAGNOSTIC STUDIES:  Oxygen Sa9ration is 96% on RA, normal by my interpretation.    COORDINATION OF CARE:  1:30 AM Pt was informed that he should return in the morning for a [place], but he states that he would rather follow up with his PCP. Discussed treatment plan with pt at bedside and pt agreed to plan.   Labs (all labs ordered are listed, but only abnormal results are displayed) Labs Reviewed - No data to display  EKG  EKG  Interpretation None       Radiology No results found.  Procedures Procedures (including critical care time)  Medications Ordered in ED Medications - No data to display   Initial Impression / Assessment and Plan / ED Course  I have reviewed the triage vital signs and the nursing notes.  Pertinent labs & imaging results that were available during my care of the patient were reviewed by me and considered in my medical decision making (see chart for details).     Patient presents with right leg pain and a sensation of "feeling warm" in the thigh this evening. No weakness, swelling, discoloration. History of left DVT on Xarelto and is compliant.   Exam is reassuring. No swelling, discoloration or reproducible tenderness. Intact and symmetric pulses. Doubt new right DVT. No symptom of PE.   Will have patient return to the hospital tomorrow for DVT study. Recommend primary care follow up.     Final Clinical Impressions(s) / ED Diagnoses   Final diagnoses:  None   1. Right LE pain  New Prescriptions New Prescriptions   No medications on file  I personally performed the services described in this documentation, which was scribed in my presence. The recorded information has been reviewed and is accurate.        Matthew Lange, PA-C 12/11/16 [id number]    Lacretia Leigh, MD 12/12/16 [phone]

## 2016-12-13 ENCOUNTER — Encounter (HOSPITAL_COMMUNITY): Payer: PRIVATE HEALTH INSURANCE

## 2017-02-02 DIAGNOSIS — E785 Hyperlipidemia, unspecified: Secondary | ICD-10-CM | POA: Diagnosis not present

## 2017-02-02 DIAGNOSIS — Z86711 Personal history of pulmonary embolism: Secondary | ICD-10-CM | POA: Diagnosis not present

## 2017-02-02 DIAGNOSIS — I10 Essential (primary) hypertension: Secondary | ICD-10-CM | POA: Diagnosis not present

## 2017-02-02 DIAGNOSIS — E119 Type 2 diabetes mellitus without complications: Secondary | ICD-10-CM | POA: Diagnosis not present

## 2017-02-02 DIAGNOSIS — I2510 Atherosclerotic heart disease of native coronary artery without angina pectoris: Secondary | ICD-10-CM | POA: Diagnosis not present

## 2017-04-07 DIAGNOSIS — H1131 Conjunctival hemorrhage, right eye: Secondary | ICD-10-CM | POA: Diagnosis not present

## 2017-04-20 ENCOUNTER — Encounter: Payer: Self-pay | Admitting: Internal Medicine

## 2017-04-20 ENCOUNTER — Ambulatory Visit (INDEPENDENT_AMBULATORY_CARE_PROVIDER_SITE_OTHER): Payer: Medicare Other | Admitting: Internal Medicine

## 2017-04-20 VITALS — BP 134/80 | HR 56 | Ht 71.0 in | Wt 288.4 lb

## 2017-04-20 DIAGNOSIS — R0600 Dyspnea, unspecified: Secondary | ICD-10-CM

## 2017-04-20 DIAGNOSIS — J449 Chronic obstructive pulmonary disease, unspecified: Secondary | ICD-10-CM | POA: Diagnosis not present

## 2017-04-20 DIAGNOSIS — R0609 Other forms of dyspnea: Secondary | ICD-10-CM | POA: Diagnosis not present

## 2017-04-20 DIAGNOSIS — G4733 Obstructive sleep apnea (adult) (pediatric): Secondary | ICD-10-CM | POA: Diagnosis not present

## 2017-04-20 NOTE — Progress Notes (Signed)
HPI male former smoker followed for OSA, history PE/ Xarelto, COPD, complicated by allergic rhinitis, DM 2, A. Fib/amiodarone He had smoked one pack per day for 30 years, quitting in June of 2012. Office PFT 11/05/2002 had recorded FVC 3500 (74%) FEV1 2800 (75%) FEV1/FVC 0.80 with no response to bronchodilator. OSA- NPSG 03/17/98- AHI 32/hr; weight was 270 lbs. he has worn CPAP with variable long-term compliance and control. He says sometimes the mask is "bothersome" because of postnasal drainage and he is evasive about whether he has been using it recently. Pressure had been set at 14. PFT-08/09/2011-mild obstructive airways disease with insignificant response to bronchodilator, mild restriction, mild reduction of DLCO FEV1/FVC 0.65, DLCO 69%. TLC 71%. 6 minute walk test 08/09/2011-96% on room air to start, falling to 88% on room air by the end of his walk. Blood pressure rose to 170/110, pulse 112. After 2 minutes recovery oxygen saturation on room air at rest was 94%. He walked 26 m CT chest 07/01/12-Positive study for bilateral central and segmental pulmonary  emboli.   -----------------------------------------------------------------------------------------------------  04/20/17- 71 year old male former smoker followed for COPD, history PE/ Xarelto, OSA, complicated by allergic rhinitis, DM 2, A. Fib/amiodarone, HBP Returning to reestablish after LOV 12/25/12 NPSG- 03/17/98-AHI 32/hour, desaturation to 77%, body weight 270 pounds Former patient last seen 3 years ago. DME: Last time was with [employer]. Pt will need HST and order for CPAP through new DME.  His CPAP machine is worn out but he has continued to use it and sleeps much better with CPAP.Marland Kitchen Needs new supplies and would like to establish with DME. His cardiologist and PCP manage Xarelto. He says he breathes fine with occasional minor cough but no regular wheeze and no inhalers. He doesn't feel he has a significant respiratory  problem. Quit smoking 2010. CTa chest 10/25/15 IMPRESSION: No significant central or proximal hilar acute pulmonary embolus by CTA. Limited assessment of the smaller segmental and subsegmental branches as above. Thoracic aortic atherosclerosis and mild ectasia without dissection or significant aneurysm. Native coronary atherosclerosis Cardiomegaly without edema or CHF Scattered parenchymal scarring and subsegmental atelectasis  ROS-see HPI   + = pos Constitutional:   No-   weight loss, night sweats, fevers, chills, fatigue, lassitude. HEENT:   No-  headaches, difficulty swallowing, tooth/dental problems, sore throat,       No-  sneezing, itching, ear ache, +nasal congestion, post nasal drip,  CV:  No-   chest pain, orthopnea, PND, swelling in lower extremities, anasarca, dizziness, palpitations Resp: No-   shortness of breath with exertion or at rest.              No-  productive cough,  + non-productive cough,  No- coughing up of blood.              No-   change in color of mucus.  No- wheezing.   Skin: No-   rash or lesions. GI:  No-   heartburn, indigestion, abdominal pain, nausea, vomiting,  GU: . MS:  No-   joint pain or swelling.  . Neuro-     nothing unusual Psych:  No- change in mood or affect. No depression or anxiety.  No memory loss.  OBJ- Physical Exam General- Alert, Oriented, Affect-appropriate, Distress- none acute, overweight,  Always chewing toothpick, + obese Skin- rash-none, lesions- none, excoriation- none Lymphadenopathy- none Head- atraumatic            Eyes- Gross vision intact, PERRLA, conjunctivae and secretions clear  Ears- Hearing, canals-normal            Nose- clear, no-Septal dev, mucus, polyps, erosion, perforation             Throat- Mallampati III , mucosa clear , drainage- none, tonsils- atrophic, chewing toothpick, + own teeth Neck- flexible , trachea midline, no stridor , thyroid nl, carotid no bruit Chest - symmetrical excursion ,  unlabored           Heart/CV- IRR+ , no murmur , no gallop  , no rub, nl s1 s2                           - JVD- none , edema- none, stasis changes- none, varices- none           Lung- diminished but clear to P&A, wheeze- none, cough- none , dullness-none, rub- none           Chest wall-  Abd-  Br/ Gen/ Rectal- Not done, not indicated Extrem- cyanosis- none, clubbing, none, atrophy- none, strength- nl Neuro- grossly intact to observation

## 2017-04-20 NOTE — Assessment & Plan Note (Addendum)
He is asymptomatic and not using bronchodilators. We can reassess this diagnosis Plan-PFT

## 2017-04-20 NOTE — Assessment & Plan Note (Signed)
We will update diagnostic documentation with unattended home sleep test. With those results we should be able to replace his old CPAP machine with on AutoPap capable device, new mask and supplies. His compliance is been good.

## 2017-04-20 NOTE — Patient Instructions (Addendum)
Order- Schedule unattended home sleep test    Dx OSA  When the sleep study updates the information for your diagnosis, I expect to order New DME, replacement for old CPAP machine, auto 5-20, mask of choice humidifier, supplies, AirView   Dx OSA  Order- schedule PFT       Dx  Dyspnea on Exertion  Please call me for results of your sleep study, a week after you do it.

## 2017-05-04 DIAGNOSIS — G4733 Obstructive sleep apnea (adult) (pediatric): Secondary | ICD-10-CM

## 2017-05-06 ENCOUNTER — Other Ambulatory Visit: Payer: Self-pay | Admitting: *Deleted

## 2017-05-06 DIAGNOSIS — G4733 Obstructive sleep apnea (adult) (pediatric): Secondary | ICD-10-CM | POA: Diagnosis not present

## 2017-05-13 DIAGNOSIS — H169 Unspecified keratitis: Secondary | ICD-10-CM | POA: Diagnosis not present

## 2017-05-13 DIAGNOSIS — H40023 Open angle with borderline findings, high risk, bilateral: Secondary | ICD-10-CM | POA: Diagnosis not present

## 2017-05-13 DIAGNOSIS — H40053 Ocular hypertension, bilateral: Secondary | ICD-10-CM | POA: Diagnosis not present

## 2017-05-23 ENCOUNTER — Telehealth: Payer: Self-pay | Admitting: Internal Medicine

## 2017-05-23 DIAGNOSIS — G4733 Obstructive sleep apnea (adult) (pediatric): Secondary | ICD-10-CM

## 2017-05-23 NOTE — Telephone Encounter (Signed)
Spoke with pt, aware of recs.  Order placed for cpap.  Pt has appt in November, will keep scheduled appt.  Nothing further needed.

## 2017-05-23 NOTE — Telephone Encounter (Signed)
HST is in Epic. Checked Katie's results and did not see a result for this patient.   CY, please advise on HST results. Thanks!

## 2017-05-23 NOTE — Telephone Encounter (Signed)
His home sleep test confirmed obstructive lseep apnea with an average of 11.9 apneas per hour and drop in oxygen level.  Recommend order- new DME, replacement for old CPAP machine, auto 5-20, mask of choice, humidifier, supplies, AirView  Dx OSA         Please make sure he had a f/u appointment w/in 31- 90 days because of insurance rules after new machine.

## 2017-05-27 DIAGNOSIS — Z23 Encounter for immunization: Secondary | ICD-10-CM | POA: Diagnosis not present

## 2017-05-27 DIAGNOSIS — E7800 Pure hypercholesterolemia, unspecified: Secondary | ICD-10-CM | POA: Diagnosis not present

## 2017-05-27 DIAGNOSIS — I10 Essential (primary) hypertension: Secondary | ICD-10-CM | POA: Diagnosis not present

## 2017-05-27 DIAGNOSIS — Z7984 Long term (current) use of oral hypoglycemic drugs: Secondary | ICD-10-CM | POA: Diagnosis not present

## 2017-05-27 DIAGNOSIS — E119 Type 2 diabetes mellitus without complications: Secondary | ICD-10-CM | POA: Diagnosis not present

## 2017-05-27 DIAGNOSIS — I700 Atherosclerosis of aorta: Secondary | ICD-10-CM | POA: Diagnosis not present

## 2017-05-27 DIAGNOSIS — I2699 Other pulmonary embolism without acute cor pulmonale: Secondary | ICD-10-CM | POA: Diagnosis not present

## 2017-05-27 DIAGNOSIS — I480 Paroxysmal atrial fibrillation: Secondary | ICD-10-CM | POA: Diagnosis not present

## 2017-05-27 DIAGNOSIS — G4730 Sleep apnea, unspecified: Secondary | ICD-10-CM | POA: Diagnosis not present

## 2017-05-27 DIAGNOSIS — E1165 Type 2 diabetes mellitus with hyperglycemia: Secondary | ICD-10-CM | POA: Diagnosis not present

## 2017-06-10 DIAGNOSIS — H04123 Dry eye syndrome of bilateral lacrimal glands: Secondary | ICD-10-CM | POA: Diagnosis not present

## 2017-06-10 DIAGNOSIS — H40053 Ocular hypertension, bilateral: Secondary | ICD-10-CM | POA: Diagnosis not present

## 2017-06-10 DIAGNOSIS — H40023 Open angle with borderline findings, high risk, bilateral: Secondary | ICD-10-CM | POA: Diagnosis not present

## 2017-06-17 NOTE — Progress Notes (Signed)
lmtcb for pt.  

## 2017-07-21 ENCOUNTER — Encounter: Payer: Self-pay | Admitting: Internal Medicine

## 2017-07-22 ENCOUNTER — Encounter: Payer: Self-pay | Admitting: Internal Medicine

## 2017-07-22 ENCOUNTER — Ambulatory Visit (INDEPENDENT_AMBULATORY_CARE_PROVIDER_SITE_OTHER): Payer: Medicare Other | Admitting: Internal Medicine

## 2017-07-22 ENCOUNTER — Ambulatory Visit: Payer: PRIVATE HEALTH INSURANCE | Admitting: Internal Medicine

## 2017-07-22 VITALS — BP 126/78 | HR 65 | Ht 71.0 in | Wt 291.0 lb

## 2017-07-22 DIAGNOSIS — R0609 Other forms of dyspnea: Secondary | ICD-10-CM | POA: Diagnosis not present

## 2017-07-22 DIAGNOSIS — R0600 Dyspnea, unspecified: Secondary | ICD-10-CM

## 2017-07-22 DIAGNOSIS — H40053 Ocular hypertension, bilateral: Secondary | ICD-10-CM | POA: Diagnosis not present

## 2017-07-22 DIAGNOSIS — J449 Chronic obstructive pulmonary disease, unspecified: Secondary | ICD-10-CM

## 2017-07-22 DIAGNOSIS — H04123 Dry eye syndrome of bilateral lacrimal glands: Secondary | ICD-10-CM | POA: Diagnosis not present

## 2017-07-22 DIAGNOSIS — H40023 Open angle with borderline findings, high risk, bilateral: Secondary | ICD-10-CM | POA: Diagnosis not present

## 2017-07-22 DIAGNOSIS — G4733 Obstructive sleep apnea (adult) (pediatric): Secondary | ICD-10-CM | POA: Diagnosis not present

## 2017-07-22 LAB — PULMONARY FUNCTION TEST
DL/VA % pred: 116 %
DL/VA: 5.41 ml/min/mmHg/L
DLCO cor % pred: 82 %
DLCO cor: 27.9 ml/min/mmHg
DLCO unc % pred: 79 %
DLCO unc: 26.82 ml/min/mmHg
FEF 25-75 Post: 1.56 L/sec
FEF 25-75 Pre: 1.48 L/sec
FEF2575-%Change-Post: 5 %
FEF2575-%Pred-Post: 60 %
FEF2575-%Pred-Pre: 57 %
FEV1-%Change-Post: 0 %
FEV1-%Pred-Post: 68 %
FEV1-%Pred-Pre: 68 %
FEV1-Post: 2.06 L
FEV1-Pre: 2.06 L
FEV1FVC-%Change-Post: -1 %
FEV1FVC-%Pred-Pre: 97 %
FEV6-%Change-Post: 0 %
FEV6-%Pred-Post: 73 %
FEV6-%Pred-Pre: 73 %
FEV6-Post: 2.79 L
FEV6-Pre: 2.77 L
FEV6FVC-%Change-Post: -1 %
FEV6FVC-%Pred-Post: 103 %
FEV6FVC-%Pred-Pre: 105 %
FVC-%Change-Post: 1 %
FVC-%Pred-Post: 71 %
FVC-%Pred-Pre: 69 %
FVC-Post: 2.83 L
FVC-Pre: 2.78 L
Post FEV1/FVC ratio: 73 %
Post FEV6/FVC ratio: 99 %
Pre FEV1/FVC ratio: 74 %
Pre FEV6/FVC Ratio: 100 %

## 2017-07-22 NOTE — Progress Notes (Signed)
HPI male former smoker followed for OSA, history PE/ Xarelto, COPD, complicated by allergic rhinitis, DM 2, A. Fib/amiodarone, glaucoma He had smoked one pack per day for 30 years, quitting in June of 2012. Office PFT 11/05/2002 had recorded FVC 3500 (74%) FEV1 2800 (75%) FEV1/FVC 0.80 with no response to bronchodilator. OSA- NPSG 03/17/98- AHI 32/hr; weight was 270 lbs. he has worn CPAP with variable long-term compliance and control. He says sometimes the mask is "bothersome" because of postnasal drainage and he is evasive about whether he has been using it recently. Pressure had been set at 14. PFT-08/09/2011-mild obstructive airways disease with insignificant response to bronchodilator, mild restriction, mild reduction of DLCO FEV1/FVC 0.65, DLCO 69%. TLC 71%. 6 minute walk test 08/09/2011-96% on room air to start, falling to 88% on room air by the end of his walk. Blood pressure rose to 170/110, pulse 112. After 2 minutes recovery oxygen saturation on room air at rest was 94%. He walked 92 m CT chest 07/01/12-Positive study for bilateral central and segmental pulmonary  emboli.  PFT 07/22/17-moderate obstructive airways disease, insignificant response -----------------------------------------------------------------------------------------------------  04/20/17- 71 year old male former smoker followed for COPD, history PE/ Xarelto, OSA, complicated by allergic rhinitis, DM 2, A. Fib/amiodarone, HBP Returning to reestablish after LOV 12/25/12 NPSG- 03/17/98-AHI 32/hour, desaturation to 77%, body weight 270 pounds Former patient last seen 3 years ago. DME: Last time was with [employer]. Pt will need HST and order for CPAP through new DME.  His CPAP machine is worn out but he has continued to use it and sleeps much better with CPAP.Marland Kitchen Needs new supplies and would like to establish with DME. His cardiologist and PCP manage Xarelto. He says he breathes fine with occasional minor cough but no  regular wheeze and no inhalers. He doesn't feel he has a significant respiratory problem. Quit smoking 2010. CTa chest 10/25/15 IMPRESSION: No significant central or proximal hilar acute pulmonary embolus by CTA. Limited assessment of the smaller segmental and subsegmental branches as above. Thoracic aortic atherosclerosis and mild ectasia without dissection or significant aneurysm. Native coronary atherosclerosis Cardiomegaly without edema or CHF Scattered parenchymal scarring and subsegmental atelectasis  07/22/17- 71 year old male former smoker followed for COPD, history PE/ Xarelto, OSA, complicated by allergic rhinitis, DM 2, A. Fib/amiodarone, HBP, glaucoma CPAP auto 5-20/ [facility] ----OSA and COPD; DME: [facility] ([place] office). Pt wears CPAP nightly and DL attached. Pt had PFT today. He says he is comfortable with CPAP and only missed some days during power outage.  We reviewed download showing 63% compliance was of missed days, averaging over 6 hours per night, AHI 0.7/hour. Stable dyspnea on exertion with dry cough.  We reviewed his PFT showing 1/FVC 7 3, TLC 79%, DLCO 79% PFT- 07/22/17-obstructive airways disease, insignificant response  ROS-see HPI   + = pos Constitutional:   No-   weight loss, night sweats, fevers, chills, fatigue, lassitude. HEENT:   No-  headaches, difficulty swallowing, tooth/dental problems, sore throat,       No-  sneezing, itching, ear ache, +nasal congestion, post nasal drip,  CV:  No-   chest pain, orthopnea, PND, swelling in lower extremities, anasarca, dizziness, palpitations Resp:  shortness of breath with exertion or at rest.              No-  productive cough,  + non-productive cough,  No- coughing up of blood.              No-   change in color of mucus.  No- wheezing.   Skin: No-   rash or lesions. GI:  No-   heartburn, indigestion, abdominal pain, nausea, vomiting,  GU: . MS:  No-   joint pain or swelling.  . Neuro-     nothing  unusual Psych:  No- change in mood or affect. No depression or anxiety.  No memory loss.  OBJ- Physical Exam General- Alert, Oriented, Affect-appropriate, Distress- none acute, overweight,  + obese Skin- rash-none, lesions- none, excoriation- none Lymphadenopathy- none Head- atraumatic            Eyes- Gross vision intact, PERRLA, conjunctivae and secretions clear            Ears- Hearing, canals-normal            Nose- clear, no-Septal dev, mucus, polyps, erosion, perforation             Throat- Mallampati III , mucosa clear , drainage- none, tonsils- atrophic, chewing toothpick, + own teeth Neck- flexible , trachea midline, no stridor , thyroid nl, carotid no bruit Chest - symmetrical excursion , unlabored           Heart/CV- IRR+ , no murmur , no gallop  , no rub, nl s1 s2                           - JVD- none , edema- none, stasis changes- none, varices- none           Lung- diminished but clear to P&A, wheeze- none, cough- none , dullness-none, rub- none           Chest wall-  Abd-  Br/ Gen/ Rectal- Not done, not indicated Extrem- cyanosis- none, clubbing, none, atrophy- none, strength- nl Neuro- grossly intact to observation

## 2017-07-22 NOTE — Patient Instructions (Signed)
We can continue CPAP auto 5-20, mask of choice, humidifier, supplies, AirView    Dx OSA  Our goal is to use CPAP anytime you sleep, all night, every night  Sample Breo 100 inhaler    Try inhaling 1 puff, then rinse your mouth, once daily. Use it up and see if the sample helps your breathing.  Please call as needed

## 2017-07-22 NOTE — Progress Notes (Signed)
PFT done today by Lindsay Lemons, CMA  

## 2017-07-24 NOTE — Assessment & Plan Note (Signed)
We discussed compliance goals.  He benefits from CPAP and intends to continue using it.

## 2017-07-24 NOTE — Assessment & Plan Note (Addendum)
The natural course of this disease is decline of airr flow speed over time Because of his glaucoma I will avoid LABA/LAMA and instead give Breo. Consider pulmonary rehab after we see what he notices with Breo.

## 2017-08-02 ENCOUNTER — Encounter: Payer: Self-pay | Admitting: Internal Medicine

## 2017-08-03 ENCOUNTER — Ambulatory Visit: Payer: Medicare Other | Admitting: Adult Health

## 2017-08-03 DIAGNOSIS — M1990 Unspecified osteoarthritis, unspecified site: Secondary | ICD-10-CM | POA: Diagnosis not present

## 2017-08-03 DIAGNOSIS — E119 Type 2 diabetes mellitus without complications: Secondary | ICD-10-CM | POA: Diagnosis not present

## 2017-08-03 DIAGNOSIS — G4733 Obstructive sleep apnea (adult) (pediatric): Secondary | ICD-10-CM | POA: Diagnosis not present

## 2017-08-03 DIAGNOSIS — Z86711 Personal history of pulmonary embolism: Secondary | ICD-10-CM | POA: Diagnosis not present

## 2017-08-03 DIAGNOSIS — Z6841 Body Mass Index (BMI) 40.0 and over, adult: Secondary | ICD-10-CM | POA: Diagnosis not present

## 2017-08-03 DIAGNOSIS — I10 Essential (primary) hypertension: Secondary | ICD-10-CM | POA: Diagnosis not present

## 2017-08-03 DIAGNOSIS — I2510 Atherosclerotic heart disease of native coronary artery without angina pectoris: Secondary | ICD-10-CM | POA: Diagnosis not present

## 2017-08-04 ENCOUNTER — Telehealth: Payer: Self-pay | Admitting: Internal Medicine

## 2017-08-04 DIAGNOSIS — G4733 Obstructive sleep apnea (adult) (pediatric): Secondary | ICD-10-CM

## 2017-08-04 NOTE — Telephone Encounter (Signed)
CY can we place an order for a humidifier or check pt pressures? I placed download on your cart to see if there is something going on with CPAP.

## 2017-08-05 NOTE — Telephone Encounter (Signed)
The download doesn't indicate anything wrong with the machine, but dry winter air may be the problem.  Suggest- Please order his DME to reduce CPAP to auto 6-15     Dx OSA  Suggest he use otc nasal saline GEL- wipe some into each nostril with finger tip or Q-tip at bedtime, and any other time he needs. This moisturizes and protects the inside of the nose from over-drying, and works better than the nasal saline sprays.

## 2017-08-05 NOTE — Telephone Encounter (Signed)
Pt is aware of CY's recommendations. Order has been placed to [facility] to adjust pressure settings. Nothing further needed.

## 2017-10-28 DIAGNOSIS — H25013 Cortical age-related cataract, bilateral: Secondary | ICD-10-CM | POA: Diagnosis not present

## 2017-10-28 DIAGNOSIS — E119 Type 2 diabetes mellitus without complications: Secondary | ICD-10-CM | POA: Diagnosis not present

## 2017-10-28 DIAGNOSIS — H2513 Age-related nuclear cataract, bilateral: Secondary | ICD-10-CM | POA: Diagnosis not present

## 2017-10-28 DIAGNOSIS — H40023 Open angle with borderline findings, high risk, bilateral: Secondary | ICD-10-CM | POA: Diagnosis not present

## 2017-11-04 DIAGNOSIS — G4733 Obstructive sleep apnea (adult) (pediatric): Secondary | ICD-10-CM | POA: Diagnosis not present

## 2017-11-04 DIAGNOSIS — H04123 Dry eye syndrome of bilateral lacrimal glands: Secondary | ICD-10-CM | POA: Diagnosis not present

## 2017-11-04 DIAGNOSIS — I2510 Atherosclerotic heart disease of native coronary artery without angina pectoris: Secondary | ICD-10-CM | POA: Diagnosis not present

## 2017-11-04 DIAGNOSIS — I10 Essential (primary) hypertension: Secondary | ICD-10-CM | POA: Diagnosis not present

## 2017-11-04 DIAGNOSIS — M1990 Unspecified osteoarthritis, unspecified site: Secondary | ICD-10-CM | POA: Diagnosis not present

## 2017-11-04 DIAGNOSIS — E119 Type 2 diabetes mellitus without complications: Secondary | ICD-10-CM | POA: Diagnosis not present

## 2017-11-04 DIAGNOSIS — Z86711 Personal history of pulmonary embolism: Secondary | ICD-10-CM | POA: Diagnosis not present

## 2017-11-04 DIAGNOSIS — H40023 Open angle with borderline findings, high risk, bilateral: Secondary | ICD-10-CM | POA: Diagnosis not present

## 2017-11-04 DIAGNOSIS — E785 Hyperlipidemia, unspecified: Secondary | ICD-10-CM | POA: Diagnosis not present

## 2017-12-20 DIAGNOSIS — I2699 Other pulmonary embolism without acute cor pulmonale: Secondary | ICD-10-CM | POA: Diagnosis not present

## 2017-12-20 DIAGNOSIS — Z1389 Encounter for screening for other disorder: Secondary | ICD-10-CM | POA: Diagnosis not present

## 2017-12-20 DIAGNOSIS — I480 Paroxysmal atrial fibrillation: Secondary | ICD-10-CM | POA: Diagnosis not present

## 2017-12-20 DIAGNOSIS — I700 Atherosclerosis of aorta: Secondary | ICD-10-CM | POA: Diagnosis not present

## 2017-12-20 DIAGNOSIS — N400 Enlarged prostate without lower urinary tract symptoms: Secondary | ICD-10-CM | POA: Diagnosis not present

## 2017-12-20 DIAGNOSIS — E119 Type 2 diabetes mellitus without complications: Secondary | ICD-10-CM | POA: Diagnosis not present

## 2017-12-20 DIAGNOSIS — Z0000 Encounter for general adult medical examination without abnormal findings: Secondary | ICD-10-CM | POA: Diagnosis not present

## 2017-12-20 DIAGNOSIS — G4730 Sleep apnea, unspecified: Secondary | ICD-10-CM | POA: Diagnosis not present

## 2017-12-20 DIAGNOSIS — I10 Essential (primary) hypertension: Secondary | ICD-10-CM | POA: Diagnosis not present

## 2017-12-20 DIAGNOSIS — E663 Overweight: Secondary | ICD-10-CM | POA: Diagnosis not present

## 2017-12-20 DIAGNOSIS — I509 Heart failure, unspecified: Secondary | ICD-10-CM | POA: Diagnosis not present

## 2017-12-20 DIAGNOSIS — E7800 Pure hypercholesterolemia, unspecified: Secondary | ICD-10-CM | POA: Diagnosis not present

## 2018-01-16 DIAGNOSIS — Z125 Encounter for screening for malignant neoplasm of prostate: Secondary | ICD-10-CM | POA: Diagnosis not present

## 2018-01-16 DIAGNOSIS — N5201 Erectile dysfunction due to arterial insufficiency: Secondary | ICD-10-CM | POA: Diagnosis not present

## 2018-01-20 ENCOUNTER — Encounter: Payer: Self-pay | Admitting: Internal Medicine

## 2018-01-20 ENCOUNTER — Ambulatory Visit (INDEPENDENT_AMBULATORY_CARE_PROVIDER_SITE_OTHER): Payer: Medicare Other | Admitting: Internal Medicine

## 2018-01-20 DIAGNOSIS — G4733 Obstructive sleep apnea (adult) (pediatric): Secondary | ICD-10-CM | POA: Diagnosis not present

## 2018-01-20 DIAGNOSIS — R0600 Dyspnea, unspecified: Secondary | ICD-10-CM

## 2018-01-20 DIAGNOSIS — R0609 Other forms of dyspnea: Secondary | ICD-10-CM | POA: Diagnosis not present

## 2018-01-20 DIAGNOSIS — J449 Chronic obstructive pulmonary disease, unspecified: Secondary | ICD-10-CM

## 2018-01-20 NOTE — Progress Notes (Signed)
HPI male former smoker followed for OSA, history PE/ Xarelto, COPD, complicated by allergic rhinitis, DM 2, A. Fib/amiodarone, glaucoma He had smoked one pack per day for 30 years, quitting in June of 2012. Office PFT 11/05/2002 had recorded FVC 3500 (74%) FEV1 2800 (75%) FEV1/FVC 0.80 with no response to bronchodilator. OSA- NPSG 03/17/98- AHI 32/hr; weight was 270 lbs. he has worn CPAP with variable long-term compliance and control. He says sometimes the mask is "bothersome" because of postnasal drainage and he is evasive about whether he has been using it recently. Pressure had been set at 14. PFT-08/09/2011-mild obstructive airways disease with insignificant response to bronchodilator, mild restriction, mild reduction of DLCO FEV1/FVC 0.65, DLCO 69%. TLC 71%. 6 minute walk test 08/09/2011-96% on room air to start, falling to 88% on room air by the end of his walk. Blood pressure rose to 170/110, pulse 112. After 2 minutes recovery oxygen saturation on room air at rest was 94%. He walked 42 m CT chest 07/01/12-Positive study for bilateral central and segmental pulmonary  emboli.  PFT 07/22/17-moderate obstructive airways disease, insignificant response -----------------------------------------------------------------------------------------------------  07/22/17- 72 year old male former smoker followed for COPD, history PE/ Xarelto, OSA, complicated by allergic rhinitis, DM 2, A. Fib/amiodarone, HBP, glaucoma CPAP auto 5-20/ [facility] ----OSA and COPD; DME: [facility] ([place] office). Pt wears CPAP nightly and DL attached. Pt had PFT today. He says he is comfortable with CPAP and only missed some days during power outage.  We reviewed download showing 63% compliance was of missed days, averaging over 6 hours per night, AHI 0.7/hour. Stable dyspnea on exertion with dry cough.  We reviewed his PFT. PFT- 07/22/17-obstructive airways disease, insignificant response FEV1/FVC .73, TLC 79%, DLCO  79%  01/20/2018- 72 year old male former smoker followed for COPD, history PE/ Xarelto, OSA, complicated by allergic rhinitis, DM 2, A. Fib/amiodarone, HBP, glaucoma CPAP auto 5-20/ [facility] ----OSA: DME: [facility]. Pt wears CPAP nightly for almost 6 hours. AHI of 0.5. DL attached. No new supplies needed at this time. Pt has slight congestion-sinus; believes this is due to CPAP not being cleaned. He sleeps very well with his CPAP and likes it-sleeps better and prevent snoring.  We discussed cleaning- SoClean, etc. Download 87% compliance AHI 0.5/hour.  Pressure range 13.2-17.4 with moderate leak which does not bother him.  Nasal pillows mask.  ROS-see HPI   + = positive Constitutional:   No-   weight loss, night sweats, fevers, chills, fatigue, lassitude. HEENT:   No-  headaches, difficulty swallowing, tooth/dental problems, sore throat,       No-  sneezing, itching, ear ache, +nasal congestion, post nasal drip,  CV:  No-   chest pain, orthopnea, PND, swelling in lower extremities, anasarca, dizziness, palpitations Resp:  shortness of breath with exertion or at rest.              No-  productive cough,  + non-productive cough,  No- coughing up of blood.              No-   change in color of mucus.  No- wheezing.   Skin: No-   rash or lesions. GI:  No-   heartburn, indigestion, abdominal pain, nausea, vomiting,  GU: . MS:  No-   joint pain or swelling.  . Neuro-     nothing unusual Psych:  No- change in mood or affect. No depression or anxiety.  No memory loss.  OBJ- Physical Exam General- Alert, Oriented, Affect-appropriate, Distress- none acute, overweight,  + obese Skin- rash-none,  lesions- none, excoriation- none Lymphadenopathy- none Head- atraumatic            Eyes- Gross vision intact, PERRLA, conjunctivae and secretions clear            Ears- Hearing, canals-normal            Nose- clear, no-Septal dev, mucus, polyps, erosion, perforation             Throat- Mallampati III ,  mucosa clear , drainage- none, tonsils- atrophic,  + own teeth Neck- flexible , trachea midline, no stridor , thyroid nl, carotid no bruit Chest - symmetrical excursion , unlabored           Heart/CV- IRR+ , no murmur , no gallop  , no rub, nl s1 s2                           - JVD- none , edema- none, stasis changes- none, varices- none           Lung- diminished but clear to P&A, wheeze- none, cough- none , dullness-none, rub- none           Chest wall-  Abd-  Br/ Gen/ Rectal- Not done, not indicated Extrem- cyanosis- none, clubbing, none, atrophy- none, strength- nl Neuro- grossly intact to observation

## 2018-01-20 NOTE — Assessment & Plan Note (Signed)
He continues to benefit, sleeping better with no snoring, using CPAP.  Very comfortable with the pressures.  Download looks good.  No changes indicated.  We did discuss cleaning options.

## 2018-01-20 NOTE — Patient Instructions (Signed)
We can continue CPAP auto 5-20, mask of choice, humidifier, supplies, AirView  Please call if we can help 

## 2018-01-20 NOTE — Assessment & Plan Note (Signed)
Little wheeze or cough at this time.  No significant respiratory infections this winter.

## 2018-01-20 NOTE — Assessment & Plan Note (Signed)
COPD, history of cardiac disease, obesity-all contribute.  Regular walking and more effort at weight control are advised.

## 2018-02-09 DIAGNOSIS — Z01810 Encounter for preprocedural cardiovascular examination: Secondary | ICD-10-CM | POA: Diagnosis not present

## 2018-02-09 DIAGNOSIS — E119 Type 2 diabetes mellitus without complications: Secondary | ICD-10-CM | POA: Diagnosis not present

## 2018-02-09 DIAGNOSIS — Z86711 Personal history of pulmonary embolism: Secondary | ICD-10-CM | POA: Diagnosis not present

## 2018-02-09 DIAGNOSIS — I2510 Atherosclerotic heart disease of native coronary artery without angina pectoris: Secondary | ICD-10-CM | POA: Diagnosis not present

## 2018-02-09 DIAGNOSIS — E785 Hyperlipidemia, unspecified: Secondary | ICD-10-CM | POA: Diagnosis not present

## 2018-02-09 DIAGNOSIS — G4733 Obstructive sleep apnea (adult) (pediatric): Secondary | ICD-10-CM | POA: Diagnosis not present

## 2018-02-09 DIAGNOSIS — I10 Essential (primary) hypertension: Secondary | ICD-10-CM | POA: Diagnosis not present

## 2018-02-09 DIAGNOSIS — M1990 Unspecified osteoarthritis, unspecified site: Secondary | ICD-10-CM | POA: Diagnosis not present

## 2018-04-28 DIAGNOSIS — H40023 Open angle with borderline findings, high risk, bilateral: Secondary | ICD-10-CM | POA: Diagnosis not present

## 2018-04-28 DIAGNOSIS — H04123 Dry eye syndrome of bilateral lacrimal glands: Secondary | ICD-10-CM | POA: Diagnosis not present

## 2018-06-16 DIAGNOSIS — M1990 Unspecified osteoarthritis, unspecified site: Secondary | ICD-10-CM | POA: Diagnosis not present

## 2018-06-16 DIAGNOSIS — I10 Essential (primary) hypertension: Secondary | ICD-10-CM | POA: Diagnosis not present

## 2018-06-16 DIAGNOSIS — E785 Hyperlipidemia, unspecified: Secondary | ICD-10-CM | POA: Diagnosis not present

## 2018-06-16 DIAGNOSIS — Z86711 Personal history of pulmonary embolism: Secondary | ICD-10-CM | POA: Diagnosis not present

## 2018-06-16 DIAGNOSIS — E119 Type 2 diabetes mellitus without complications: Secondary | ICD-10-CM | POA: Diagnosis not present

## 2018-06-16 DIAGNOSIS — G4733 Obstructive sleep apnea (adult) (pediatric): Secondary | ICD-10-CM | POA: Diagnosis not present

## 2018-06-16 DIAGNOSIS — I2510 Atherosclerotic heart disease of native coronary artery without angina pectoris: Secondary | ICD-10-CM | POA: Diagnosis not present

## 2018-06-19 DIAGNOSIS — I480 Paroxysmal atrial fibrillation: Secondary | ICD-10-CM | POA: Diagnosis not present

## 2018-06-19 DIAGNOSIS — I700 Atherosclerosis of aorta: Secondary | ICD-10-CM | POA: Diagnosis not present

## 2018-06-19 DIAGNOSIS — G4730 Sleep apnea, unspecified: Secondary | ICD-10-CM | POA: Diagnosis not present

## 2018-06-19 DIAGNOSIS — E7800 Pure hypercholesterolemia, unspecified: Secondary | ICD-10-CM | POA: Diagnosis not present

## 2018-06-19 DIAGNOSIS — E1169 Type 2 diabetes mellitus with other specified complication: Secondary | ICD-10-CM | POA: Diagnosis not present

## 2018-06-19 DIAGNOSIS — Z23 Encounter for immunization: Secondary | ICD-10-CM | POA: Diagnosis not present

## 2018-06-19 DIAGNOSIS — I509 Heart failure, unspecified: Secondary | ICD-10-CM | POA: Diagnosis not present

## 2018-06-19 DIAGNOSIS — I2699 Other pulmonary embolism without acute cor pulmonale: Secondary | ICD-10-CM | POA: Diagnosis not present

## 2018-06-19 DIAGNOSIS — I10 Essential (primary) hypertension: Secondary | ICD-10-CM | POA: Diagnosis not present

## 2018-09-11 ENCOUNTER — Emergency Department (HOSPITAL_COMMUNITY): Payer: Medicare Other

## 2018-09-11 ENCOUNTER — Emergency Department (HOSPITAL_COMMUNITY)
Admission: EM | Admit: 2018-09-11 | Discharge: 2018-09-11 | Disposition: A | Discharge status: Home / Self Care | Payer: Medicare Other | Source: Home / Self Care | Attending: Emergency Medicine | Admitting: Emergency Medicine

## 2018-09-11 ENCOUNTER — Other Ambulatory Visit: Payer: Self-pay

## 2018-09-11 DIAGNOSIS — Z7984 Long term (current) use of oral hypoglycemic drugs: Secondary | ICD-10-CM | POA: Insufficient documentation

## 2018-09-11 DIAGNOSIS — K8000 Calculus of gallbladder with acute cholecystitis without obstruction: Secondary | ICD-10-CM | POA: Diagnosis not present

## 2018-09-11 DIAGNOSIS — Z87891 Personal history of nicotine dependence: Secondary | ICD-10-CM

## 2018-09-11 DIAGNOSIS — E119 Type 2 diabetes mellitus without complications: Secondary | ICD-10-CM | POA: Insufficient documentation

## 2018-09-11 DIAGNOSIS — I5032 Chronic diastolic (congestive) heart failure: Secondary | ICD-10-CM | POA: Insufficient documentation

## 2018-09-11 DIAGNOSIS — Z955 Presence of coronary angioplasty implant and graft: Secondary | ICD-10-CM

## 2018-09-11 DIAGNOSIS — K810 Acute cholecystitis: Secondary | ICD-10-CM | POA: Diagnosis not present

## 2018-09-11 DIAGNOSIS — Z6841 Body Mass Index (BMI) 40.0 and over, adult: Secondary | ICD-10-CM | POA: Diagnosis not present

## 2018-09-11 DIAGNOSIS — A4151 Sepsis due to Escherichia coli [E. coli]: Secondary | ICD-10-CM | POA: Diagnosis not present

## 2018-09-11 DIAGNOSIS — Z79899 Other long term (current) drug therapy: Secondary | ICD-10-CM | POA: Insufficient documentation

## 2018-09-11 DIAGNOSIS — R1084 Generalized abdominal pain: Secondary | ICD-10-CM | POA: Diagnosis not present

## 2018-09-11 DIAGNOSIS — D72829 Elevated white blood cell count, unspecified: Secondary | ICD-10-CM | POA: Diagnosis not present

## 2018-09-11 DIAGNOSIS — Z7982 Long term (current) use of aspirin: Secondary | ICD-10-CM

## 2018-09-11 DIAGNOSIS — R9431 Abnormal electrocardiogram [ECG] [EKG]: Secondary | ICD-10-CM | POA: Diagnosis not present

## 2018-09-11 DIAGNOSIS — R1013 Epigastric pain: Secondary | ICD-10-CM | POA: Diagnosis not present

## 2018-09-11 DIAGNOSIS — J449 Chronic obstructive pulmonary disease, unspecified: Secondary | ICD-10-CM | POA: Insufficient documentation

## 2018-09-11 DIAGNOSIS — R1011 Right upper quadrant pain: Secondary | ICD-10-CM | POA: Diagnosis not present

## 2018-09-11 DIAGNOSIS — I2510 Atherosclerotic heart disease of native coronary artery without angina pectoris: Secondary | ICD-10-CM | POA: Insufficient documentation

## 2018-09-11 DIAGNOSIS — R079 Chest pain, unspecified: Secondary | ICD-10-CM | POA: Diagnosis not present

## 2018-09-11 DIAGNOSIS — E871 Hypo-osmolality and hyponatremia: Secondary | ICD-10-CM | POA: Diagnosis not present

## 2018-09-11 DIAGNOSIS — K8020 Calculus of gallbladder without cholecystitis without obstruction: Secondary | ICD-10-CM | POA: Diagnosis not present

## 2018-09-11 DIAGNOSIS — A419 Sepsis, unspecified organism: Secondary | ICD-10-CM | POA: Diagnosis not present

## 2018-09-11 DIAGNOSIS — R6520 Severe sepsis without septic shock: Secondary | ICD-10-CM | POA: Diagnosis not present

## 2018-09-11 DIAGNOSIS — N179 Acute kidney failure, unspecified: Secondary | ICD-10-CM | POA: Diagnosis not present

## 2018-09-11 DIAGNOSIS — I110 Hypertensive heart disease with heart failure: Secondary | ICD-10-CM

## 2018-09-11 DIAGNOSIS — R140 Abdominal distension (gaseous): Secondary | ICD-10-CM | POA: Diagnosis not present

## 2018-09-11 LAB — CBC
HCT: 39.4 % (ref 39.0–52.0)
Hemoglobin: 13.7 g/dL (ref 13.0–17.0)
MCH: 27.3 pg (ref 26.0–34.0)
MCHC: 34.8 g/dL (ref 30.0–36.0)
MCV: 78.5 fL — ABNORMAL LOW (ref 80.0–100.0)
Platelets: 180 10*3/uL (ref 150–400)
RBC: 5.02 MIL/uL (ref 4.22–5.81)
RDW: 16.6 % — ABNORMAL HIGH (ref 11.5–15.5)
WBC: 11.3 10*3/uL — ABNORMAL HIGH (ref 4.0–10.5)
nRBC: 0 % (ref 0.0–0.2)

## 2018-09-11 LAB — COMPREHENSIVE METABOLIC PANEL
ALT: 35 U/L (ref 0–44)
AST: 29 U/L (ref 15–41)
Albumin: 4.6 g/dL (ref 3.5–5.0)
Alkaline Phosphatase: 38 U/L (ref 38–126)
Anion gap: 12 (ref 5–15)
BUN: 15 mg/dL (ref 8–23)
CO2: 21 mmol/L — ABNORMAL LOW (ref 22–32)
Calcium: 9.6 mg/dL (ref 8.9–10.3)
Chloride: 102 mmol/L (ref 98–111)
Creatinine, Ser: 1.01 mg/dL (ref 0.61–1.24)
GFR calc Af Amer: >60 mL/min (ref >60)
GFR calc non Af Amer: >60 mL/min (ref >60)
Glucose, Bld: 230 mg/dL — ABNORMAL HIGH (ref 70–99)
Potassium: 3.7 mmol/L (ref 3.5–5.1)
Sodium: 135 mmol/L (ref 135–145)
Total Bilirubin: 0.7 mg/dL (ref 0.3–1.2)
Total Protein: 8.1 g/dL (ref 6.5–8.1)

## 2018-09-11 LAB — TROPONIN I: Troponin I: <0.03 ng/mL (ref <0.03)

## 2018-09-11 LAB — URINALYSIS, ROUTINE W REFLEX MICROSCOPIC
Bilirubin Urine: NEGATIVE
Glucose, UA: >=500 mg/dL — AB
Ketones, ur: NEGATIVE mg/dL
Nitrite: NEGATIVE
Protein, ur: NEGATIVE mg/dL
RBC / HPF: >50 RBC/hpf — ABNORMAL HIGH (ref 0–5)
Specific Gravity, Urine: 1.014 (ref 1.005–1.030)
pH: 6 (ref 5.0–8.0)

## 2018-09-11 LAB — LIPASE, BLOOD: Lipase: 47 U/L (ref 11–51)

## 2018-09-11 LAB — BRAIN NATRIURETIC PEPTIDE: B Natriuretic Peptide: 31.6 pg/mL (ref 0.0–100.0)

## 2018-09-11 MED ORDER — SODIUM CHLORIDE (PF) 0.9 % IJ SOLN
INTRAMUSCULAR | Status: AC
  Filled 2018-09-11: qty 50

## 2018-09-11 MED ORDER — IOPAMIDOL (ISOVUE-300) INJECTION 61%
INTRAVENOUS | Status: AC
  Filled 2018-09-11: qty 100

## 2018-09-11 MED ORDER — IOPAMIDOL (ISOVUE-300) INJECTION 61%
100.0000 mL | Freq: Once | INTRAVENOUS | Status: AC | PRN
  Administered 2018-09-11: 100 mL via INTRAVENOUS

## 2018-09-11 MED ORDER — MORPHINE SULFATE (PF) 4 MG/ML IV SOLN
4.0000 mg | Freq: Once | INTRAVENOUS | Status: AC
  Administered 2018-09-11: 4 mg via INTRAVENOUS
  Filled 2018-09-11: qty 1

## 2018-09-11 NOTE — ED Notes (Signed)
Pt to ct 

## 2018-09-11 NOTE — ED Notes (Signed)
Pt returned from CT °

## 2018-09-11 NOTE — ED Notes (Signed)
Bed: WA25 Expected date:  Expected time:  Means of arrival:  Comments: Triage 1 

## 2018-09-11 NOTE — ED Triage Notes (Addendum)
Pt stated "I feel bloated.  It happened before I went to bed.  My last BM was maybe Saturday but it wasn't a real, real good one."  Pt belching in Triage.

## 2018-09-11 NOTE — ED Provider Notes (Signed)
[facility] DEPT Provider Note   CSN: [id number] Arrival date & time: 09/11/18  0554     History   Chief Complaint Chief Complaint  Patient presents with  . Bloated  . Abdominal Pain    HPI Matthew Ochoa. is a 73 y.o. male.  73 y.o male with a PMH of HTN,HLD, DM, COPD presents to the ED with a chief complaint of abdominal bloating and pain since yesterday. Patient reports he has been feeling bloated since before church and has not been able to get relieve. He reports feeling generalized abdominal pain worse on the epigastric and right upper quadrant. He reports the pain is worse with palpation and has some relieve with belching. He reports taking an antacids prior to arrival but reports no relieve in symptoms. He states "I could have had a laxatives at home but I guess I didn't". He reports his last bowel movements was a few days ago, and he is usually irregular with his bowel movements. He reports still passing gas.  Patient also reports an episode of chest pain while at church yesterday which he states felt like gas, and was improved with belching. Denies any nausea, vomiting, fever, shortness of breath or other complaints.      Past Medical History:  Diagnosis Date  . Abdominal pain, right upper quadrant   . Allergic rhinitis   . Asthma    as child  . Atrial enlargement, left y-2  . Benign localized hyperplasia of prostate with urinary obstruction and other lower urinary tract symptoms (LUTS)(600.21)   . Calculus of gallbladder without mention of cholecystitis or obstruction   . COPD (chronic obstructive pulmonary disease) ([place])   . Diabetes mellitus   . DVT (deep venous thrombosis) ([place])   . Hematuria, unspecified   . HTN (hypertension)   . Hyperlipidemia   . Hypertension   . Pulmonary embolism ([place])   . Sleep apnea    NPSG 03-17-98, cpap 14  . Type 2 diabetes mellitus [facility])     Patient Active Problem List   Diagnosis Date Noted  .  Toenail fungus 09/26/2014  . Chronic anticoagulation 05/21/2013  . Diabetes mellitus type 2 with complications ([place]) 25/01/3975  . Patellar tendinitis 02/20/2013  . Knee strain 02/20/2013  . HTN (hypertension), malignant 02/20/2013  . Frozen shoulder syndrome 02/20/2013  . Degenerative joint disease of acromioclavicular joint 02/20/2013  . Chondromalacia of patella 01/24/2013  . Other and unspecified hyperlipidemia 01/24/2013  . Pulmonary embolus ([place]) 01/24/2013  . CAD (coronary artery disease) of artery bypass graft 01/24/2013  . Chronic diastolic CHF (congestive heart failure), NYHA class 1 ([place]) 01/24/2013  . Pulmonary embolism ([place]) 07/02/2012  . Atrial tachycardia ([place]) 07/02/2012  . Hyperlipidemia 07/02/2012  . CAD (coronary artery disease) 07/02/2012  . Cholelithiasis 12/27/2011  . COPD mixed type ([place]) 09/05/2011  . Dyspnea on exertion 08/01/2011  . Diabetes mellitus type 2, controlled ([place]) 02/04/2008  . OBSTRUCTIVE SLEEP APNEA 01/22/2008  . Essential hypertension 01/22/2008  . ALLERGIC RHINITIS 01/22/2008    Past Surgical History:  Procedure Laterality Date  . ANGIOPLASTY     stent  . COLONOSCOPY    . CORONARY ANGIOPLASTY WITH STENT PLACEMENT    . POLYPECTOMY          Home Medications    Prior to Admission medications   Medication Sig Start Date End Date Taking? Authorizing Provider  acetaminophen (TYLENOL) 325 MG tablet Take 325-650 mg by mouth every 6 (six) hours as needed for  mild pain or headache.   Yes [provider]  amiodarone (PACERONE) 200 MG tablet Take 100 mg by mouth every Monday, Wednesday, and Friday.  02/22/15  Yes [provider]  amLODipine (NORVASC) 5 MG tablet Take 1 tablet (5 mg total) by mouth daily. 07/06/16  Yes Micheline Chapman, NP  aspirin 81 MG chewable tablet Chew 81 mg by mouth daily.   Yes [provider]  atorvastatin (LIPITOR) 40 MG tablet TAKE 1 TABLET( 40 MG TOTAL) BY MOUTH EVERY EVENING Patient  taking differently: Take 40 mg by mouth daily at 6 PM. TAKE 1 TABLET( 40 MG TOTAL) BY MOUTH EVERY EVENING 07/26/16  Yes Micheline Chapman, NP  finasteride (PROSCAR) 5 MG tablet Take 5 mg by mouth daily.  10/04/14  Yes [provider]  furosemide (LASIX) 40 MG tablet Take 40 mg by mouth daily. TAKE 1/2-1 TABLET BY MOUTH ONCE DAILY 90 03/16/17  Yes [provider]  glimepiride (AMARYL) 4 MG tablet Take 4 mg by mouth daily with breakfast. TAKE 1 TABLET BY MOUTH EVERY DAY WITH BREAKFAST OR FIRST MAIN MEAL OF THE [place] 03/22/17  Yes [provider]  latanoprost (XALATAN) 0.005 % ophthalmic solution Place 1 drop into both eyes at bedtime.   Yes [provider]  losartan (COZAAR) 100 MG tablet Take 100 mg by mouth daily. 02/09/17  Yes [provider]  metFORMIN (GLUCOPHAGE) 500 MG tablet Take 1 tablet (500 mg total) by mouth 3 (three) times daily. Patient taking differently: Take 500-1,000 mg by mouth 2 (two) times daily. 1000mg  in the Morning. 500mg  in the afternoon. 03/19/15  Yes Micheline Chapman, NP  nitroGLYCERIN (NITROSTAT) 0.4 MG SL tablet Place 0.4 mg under the tongue every 5 (five) minutes as needed. For chest pain   Yes [provider]  rivaroxaban (XARELTO) 20 MG TABS tablet Take 1 tablet (20 mg total) by mouth daily with supper. 12/18/14  Yes [name], FNP  hydrochlorothiazide (HYDRODIURIL) 25 MG tablet Take 1 tablet (25 mg total) by mouth daily. Patient not taking: Reported on 09/11/2018 04/09/16   Micheline Chapman, NP    Family History Family History  Problem Relation Age of Onset  . Lung cancer Father   . Asthma Sister   . Breast cancer Sister   . Colon cancer Neg Hx   . Rectal cancer Neg Hx   . Stomach cancer Neg Hx     Social History Social History   Tobacco Use  . Smoking status: Former Smoker    Packs/day: 1.00    Years: 30.00    Pack years: 30.00    Types: Cigarettes    Last attempt to quit: 04/13/2011    Years  since quitting: 7.4  . Smokeless tobacco: Never Used  Substance Use Topics  . Alcohol use: Yes    Comment: Socially  . Drug use: No     Allergies   Lisinopril   Review of Systems Review of Systems  Constitutional: Negative for fever.  HENT: Negative for sore throat.   Respiratory: Negative for shortness of breath.   Cardiovascular: Positive for chest pain.  Gastrointestinal: Positive for abdominal pain. Negative for nausea and vomiting.  Genitourinary: Negative for dysuria and flank pain.  Musculoskeletal: Negative for back pain.  All other systems reviewed and are negative.    Physical Exam Updated Vital Signs BP (!) 173/102   Pulse 84   Temp 98.4 F (36.9 C) (Oral)   Resp (!) 21  Ht 5\' 10"  (1.778 m)   Wt 128.1 kg   SpO2 99%   BMI 40.52 kg/m   Physical Exam Vitals signs and nursing note reviewed.  Constitutional:      General: He is not in acute distress.    Appearance: He is well-developed. He is not ill-appearing.  HENT:     Head: Normocephalic and atraumatic.  Eyes:     General: No scleral icterus.    Pupils: Pupils are equal, round, and reactive to light.  Neck:     Musculoskeletal: Normal range of motion.  Cardiovascular:     Heart sounds: Normal heart sounds.  Pulmonary:     Effort: Pulmonary effort is normal.     Breath sounds: Normal breath sounds. No wheezing.  Chest:     Chest wall: No tenderness.  Abdominal:     General: Bowel sounds are normal. There is distension.     Palpations: Abdomen is soft.     Tenderness: There is abdominal tenderness in the right upper quadrant and epigastric area. There is no right CVA tenderness or left CVA tenderness.     Comments: Significant distention of abdomen, tenderness with palpation and decreased bowel sounds.   Musculoskeletal:        General: No tenderness or deformity.  Skin:    General: Skin is warm and dry.  Neurological:     Mental Status: He is alert and oriented to person, place, and time.        ED Treatments / Results  Labs (all labs ordered are listed, but only abnormal results are displayed) Labs Reviewed  COMPREHENSIVE METABOLIC PANEL - Abnormal; Notable for the following components:      Result Value   CO2 21 (*)    Glucose, Bld 230 (*)    All other components within normal limits  CBC - Abnormal; Notable for the following components:   WBC 11.3 (*)    MCV 78.5 (*)    RDW 16.6 (*)    All other components within normal limits  URINALYSIS, ROUTINE W REFLEX MICROSCOPIC - Abnormal; Notable for the following components:   APPearance HAZY (*)    Glucose, UA >=500 (*)    Hgb urine dipstick LARGE (*)    Leukocytes, UA SMALL (*)    RBC / HPF >50 (*)    Bacteria, UA MANY (*)    All other components within normal limits  LIPASE, BLOOD  TROPONIN I  BRAIN NATRIURETIC PEPTIDE    EKG EKG Interpretation  Date/Time:  Monday September 11 2018 06:44:33 EST Ventricular Rate:  76 PR Interval:    QRS Duration: 109 QT Interval:  398 QTC Calculation: 448 R Axis:   -74 Text Interpretation:  Sinus rhythm LAD, consider left anterior fascicular block Left ventricular hypertrophy Anterior Q waves, possibly due to LVH Borderline T abnormalities, inferior leads Since last tracing rate faster 25 Oct 2015 Confirmed by Rolland Porter [phone]) on 09/11/2018 6:50:48 AM   Radiology Ct Abdomen Pelvis W Contrast  Result Date: 09/11/2018 CLINICAL DATA:  Abdominal distention. EXAM: CT ABDOMEN AND PELVIS WITH CONTRAST TECHNIQUE: Multidetector CT imaging of the abdomen and pelvis was performed using the standard protocol following bolus administration of intravenous contrast. CONTRAST:  118mL ISOVUE-300 IOPAMIDOL (ISOVUE-300) INJECTION 61% COMPARISON:  CT scan dated 06/11/2015 FINDINGS: Lower chest: No acute abnormality. Aortic and coronary atherosclerosis. Slight scarring in the right middle lobe, stable. Hepatobiliary: Hepatic steatosis. Extensive cholelithiasis. Slight new thickening and edema of  the gallbladder wall. No dilated bile  ducts. Pancreas: Unremarkable. No pancreatic ductal dilatation or surrounding inflammatory changes. Spleen: Chronic calcified granulomas. Otherwise normal. Adrenals/Urinary Tract: Adrenal glands are normal. Multiple stable bilateral renal cysts. No hydronephrosis. Bladder is normal. Stomach/Bowel: There are multiple diverticula scattered throughout the colon. There are also numerous diverticula in the terminal ileum. Small bowel is otherwise normal. Stomach and appendix are normal. Vascular/Lymphatic: Aortic atherosclerosis. No enlarged abdominal or pelvic lymph nodes. Reproductive: Prostate is unremarkable. Other: Small right inguinal hernia containing only fat. Two tiny periumbilical hernias containing only fat. These are unchanged. No ascites. Musculoskeletal: No acute abnormality. Multilevel chronic degenerative disc disease in the lower lumbar spine. IMPRESSION: 1. Extensive cholelithiasis with new slight thickening and edema of the gallbladder wall. 2. Extensive diverticulosis of the colon and of the terminal ileum. Aortic Atherosclerosis (ICD10-I70.0). Electronically Signed   By: Lorriane Shire M.D.   On: 09/11/2018 10:47   Dg Abdomen Acute W/chest  Result Date: 09/11/2018 CLINICAL DATA:  Abdominal pain and distension beginning today. EXAM: DG ABDOMEN ACUTE W/ 1V CHEST COMPARISON:  10/25/2015 FINDINGS: One-view chest is unremarkable without evidence of infiltrate, collapse or effusion. No free air seen under the diaphragm. Abdominal radiographs show a moderate amount of fecal material throughout the colon. No sign of small bowel pathology. No acute calcifications or bone findings. There is chronic arterial calcification and chronic lumbar degenerative change. IMPRESSION: Negative acute abdominal series. Moderate amount of fecal material throughout the colon. Electronically Signed   By: Nelson Chimes M.D.   On: 09/11/2018 07:10    Procedures Procedures (including  critical care time)  Medications Ordered in ED Medications  sodium chloride (PF) 0.9 % injection (  Canceled Entry 09/11/18 1054)  morphine 4 MG/ML injection 4 mg (4 mg Intravenous Given 09/11/18 0837)  iopamidol (ISOVUE-300) 61 % injection 100 mL ( Intravenous Canceled Entry 09/11/18 1054)     Initial Impression / Assessment and Plan / ED Course  I have reviewed the triage vital signs and the nursing notes.  Pertinent labs & imaging results that were available during my care of the patient were reviewed by me and considered in my medical decision making (see chart for details).     Patient presents abdominal distention since Sunday.  Reports he is likely constipated, he had a bowel movement 2 days ago reports "it was in a real good 1 ".  CMP showed no lecture light abnormality, glucose is elevated at 230, LFTs are within normal limit.  CBC shows slight increase in white blood cell count.  Base was within normal limits, BNP obtain as patient does have a previous history of heart failure and reports having an episode of chest pain while at church, level within normal limits.  First troponin was also negative.  KG had no changes from previous visits or changes consistent with infarct or STEMI.  UA did show bacteria along with small leukocytes but large hemoglobin, rare symptoms at this time. DG abdomen acute which has showed: Negative acute abdominal series. Moderate amount of fecal material  throughout the colon.   Patient's vitals are reassuring, he is afebrile however due to large amount of blood in his urine along with dark fecal material will obtain a CT abdomen and pelvis to rule out any acute process at this time.  11:17 AM CT abdomen and pelvis showed: 1. Extensive cholelithiasis with new slight thickening and edema of  the gallbladder wall.  2. Extensive diverticulosis of the colon and of the terminal ileum.     At  this time patient will be discharged with otc miralax and fleet  enemas. He understand and agrees with management. Return precautions provided.   Final Clinical Impressions(s) / ED Diagnoses   Final diagnoses:  Generalized abdominal pain    ED Discharge Orders    None       Janeece Fitting, PA-C 09/11/18 [place], DO 09/11/18 1145

## 2018-09-11 NOTE — Discharge Instructions (Addendum)
Please have 8 scoops of Miralax in 32 ounces of fluid. You may also purchase a Fleet Enema over the counter at a convenience store. You may return to the ED if you experience any chest pain, shortness of breath or worsening symptoms.  Follow-up with your primary care physician for the blood in your urine.

## 2018-09-11 NOTE — ED Notes (Signed)
Urine culture sent to the lab. 

## 2018-09-13 ENCOUNTER — Other Ambulatory Visit: Payer: Self-pay | Admitting: Internal Medicine

## 2018-09-13 ENCOUNTER — Encounter (HOSPITAL_COMMUNITY): Payer: Self-pay | Admitting: Emergency Medicine

## 2018-09-13 ENCOUNTER — Other Ambulatory Visit: Payer: Self-pay

## 2018-09-13 ENCOUNTER — Emergency Department (HOSPITAL_COMMUNITY): Payer: Medicare Other

## 2018-09-13 ENCOUNTER — Other Ambulatory Visit (HOSPITAL_COMMUNITY): Payer: Self-pay | Admitting: Internal Medicine

## 2018-09-13 ENCOUNTER — Inpatient Hospital Stay (HOSPITAL_COMMUNITY)
Admission: EM | Admit: 2018-09-13 | Discharge: 2018-09-18 | DRG: 872 | Disposition: A | Discharge status: Home Health | Payer: Medicare Other | Source: Ambulatory Visit | Attending: Internal Medicine | Admitting: Internal Medicine

## 2018-09-13 DIAGNOSIS — A4151 Sepsis due to Escherichia coli [E. coli]: Principal | ICD-10-CM | POA: Diagnosis present

## 2018-09-13 DIAGNOSIS — Z6841 Body Mass Index (BMI) 40.0 and over, adult: Secondary | ICD-10-CM

## 2018-09-13 DIAGNOSIS — E1165 Type 2 diabetes mellitus with hyperglycemia: Secondary | ICD-10-CM | POA: Diagnosis present

## 2018-09-13 DIAGNOSIS — R269 Unspecified abnormalities of gait and mobility: Secondary | ICD-10-CM

## 2018-09-13 DIAGNOSIS — I5032 Chronic diastolic (congestive) heart failure: Secondary | ICD-10-CM | POA: Diagnosis present

## 2018-09-13 DIAGNOSIS — Z7901 Long term (current) use of anticoagulants: Secondary | ICD-10-CM

## 2018-09-13 DIAGNOSIS — I110 Hypertensive heart disease with heart failure: Secondary | ICD-10-CM | POA: Diagnosis present

## 2018-09-13 DIAGNOSIS — A419 Sepsis, unspecified organism: Secondary | ICD-10-CM | POA: Diagnosis not present

## 2018-09-13 DIAGNOSIS — Z79899 Other long term (current) drug therapy: Secondary | ICD-10-CM | POA: Diagnosis not present

## 2018-09-13 DIAGNOSIS — I4891 Unspecified atrial fibrillation: Secondary | ICD-10-CM | POA: Diagnosis not present

## 2018-09-13 DIAGNOSIS — K5730 Diverticulosis of large intestine without perforation or abscess without bleeding: Secondary | ICD-10-CM | POA: Diagnosis present

## 2018-09-13 DIAGNOSIS — N179 Acute kidney failure, unspecified: Secondary | ICD-10-CM | POA: Diagnosis not present

## 2018-09-13 DIAGNOSIS — Z888 Allergy status to other drugs, medicaments and biological substances status: Secondary | ICD-10-CM

## 2018-09-13 DIAGNOSIS — N400 Enlarged prostate without lower urinary tract symptoms: Secondary | ICD-10-CM | POA: Diagnosis present

## 2018-09-13 DIAGNOSIS — Z7984 Long term (current) use of oral hypoglycemic drugs: Secondary | ICD-10-CM

## 2018-09-13 DIAGNOSIS — R8290 Unspecified abnormal findings in urine: Secondary | ICD-10-CM | POA: Diagnosis not present

## 2018-09-13 DIAGNOSIS — J449 Chronic obstructive pulmonary disease, unspecified: Secondary | ICD-10-CM | POA: Diagnosis present

## 2018-09-13 DIAGNOSIS — R1011 Right upper quadrant pain: Secondary | ICD-10-CM

## 2018-09-13 DIAGNOSIS — E785 Hyperlipidemia, unspecified: Secondary | ICD-10-CM | POA: Diagnosis present

## 2018-09-13 DIAGNOSIS — K8000 Calculus of gallbladder with acute cholecystitis without obstruction: Secondary | ICD-10-CM | POA: Diagnosis present

## 2018-09-13 DIAGNOSIS — Z9989 Dependence on other enabling machines and devices: Secondary | ICD-10-CM

## 2018-09-13 DIAGNOSIS — Z87891 Personal history of nicotine dependence: Secondary | ICD-10-CM

## 2018-09-13 DIAGNOSIS — K5909 Other constipation: Secondary | ICD-10-CM | POA: Diagnosis present

## 2018-09-13 DIAGNOSIS — Z86711 Personal history of pulmonary embolism: Secondary | ICD-10-CM

## 2018-09-13 DIAGNOSIS — I2510 Atherosclerotic heart disease of native coronary artery without angina pectoris: Secondary | ICD-10-CM | POA: Diagnosis present

## 2018-09-13 DIAGNOSIS — E861 Hypovolemia: Secondary | ICD-10-CM | POA: Diagnosis present

## 2018-09-13 DIAGNOSIS — E860 Dehydration: Secondary | ICD-10-CM | POA: Diagnosis not present

## 2018-09-13 DIAGNOSIS — Z01810 Encounter for preprocedural cardiovascular examination: Secondary | ICD-10-CM | POA: Diagnosis not present

## 2018-09-13 DIAGNOSIS — D509 Iron deficiency anemia, unspecified: Secondary | ICD-10-CM | POA: Diagnosis present

## 2018-09-13 DIAGNOSIS — R109 Unspecified abdominal pain: Secondary | ICD-10-CM | POA: Diagnosis not present

## 2018-09-13 DIAGNOSIS — I480 Paroxysmal atrial fibrillation: Secondary | ICD-10-CM | POA: Diagnosis present

## 2018-09-13 DIAGNOSIS — Z8679 Personal history of other diseases of the circulatory system: Secondary | ICD-10-CM

## 2018-09-13 DIAGNOSIS — R6520 Severe sepsis without septic shock: Secondary | ICD-10-CM | POA: Diagnosis not present

## 2018-09-13 DIAGNOSIS — K8020 Calculus of gallbladder without cholecystitis without obstruction: Secondary | ICD-10-CM | POA: Diagnosis not present

## 2018-09-13 DIAGNOSIS — G4733 Obstructive sleep apnea (adult) (pediatric): Secondary | ICD-10-CM | POA: Diagnosis present

## 2018-09-13 DIAGNOSIS — K8010 Calculus of gallbladder with chronic cholecystitis without obstruction: Secondary | ICD-10-CM | POA: Diagnosis not present

## 2018-09-13 DIAGNOSIS — K810 Acute cholecystitis: Secondary | ICD-10-CM | POA: Diagnosis not present

## 2018-09-13 DIAGNOSIS — Z86718 Personal history of other venous thrombosis and embolism: Secondary | ICD-10-CM

## 2018-09-13 DIAGNOSIS — E871 Hypo-osmolality and hyponatremia: Secondary | ICD-10-CM | POA: Diagnosis present

## 2018-09-13 DIAGNOSIS — R9431 Abnormal electrocardiogram [ECG] [EKG]: Secondary | ICD-10-CM | POA: Diagnosis not present

## 2018-09-13 DIAGNOSIS — I959 Hypotension, unspecified: Secondary | ICD-10-CM | POA: Diagnosis not present

## 2018-09-13 DIAGNOSIS — Z4659 Encounter for fitting and adjustment of other gastrointestinal appliance and device: Secondary | ICD-10-CM | POA: Diagnosis not present

## 2018-09-13 DIAGNOSIS — Z955 Presence of coronary angioplasty implant and graft: Secondary | ICD-10-CM

## 2018-09-13 DIAGNOSIS — Z7982 Long term (current) use of aspirin: Secondary | ICD-10-CM

## 2018-09-13 DIAGNOSIS — E119 Type 2 diabetes mellitus without complications: Secondary | ICD-10-CM | POA: Diagnosis not present

## 2018-09-13 DIAGNOSIS — I10 Essential (primary) hypertension: Secondary | ICD-10-CM | POA: Diagnosis not present

## 2018-09-13 DIAGNOSIS — D72829 Elevated white blood cell count, unspecified: Secondary | ICD-10-CM | POA: Diagnosis not present

## 2018-09-13 LAB — URINALYSIS, ROUTINE W REFLEX MICROSCOPIC
Bilirubin Urine: NEGATIVE
Glucose, UA: NEGATIVE mg/dL
Ketones, ur: NEGATIVE mg/dL
Nitrite: NEGATIVE
Protein, ur: 100 mg/dL — AB
Specific Gravity, Urine: 1.017 (ref 1.005–1.030)
WBC, UA: >50 WBC/hpf — ABNORMAL HIGH (ref 0–5)
pH: 5 (ref 5.0–8.0)

## 2018-09-13 LAB — COMPREHENSIVE METABOLIC PANEL
ALT: 50 U/L — ABNORMAL HIGH (ref 0–44)
AST: 31 U/L (ref 15–41)
Albumin: 3.8 g/dL (ref 3.5–5.0)
Alkaline Phosphatase: 46 U/L (ref 38–126)
Anion gap: 12 (ref 5–15)
BUN: 33 mg/dL — ABNORMAL HIGH (ref 8–23)
CO2: 22 mmol/L (ref 22–32)
Calcium: 8.7 mg/dL — ABNORMAL LOW (ref 8.9–10.3)
Chloride: 99 mmol/L (ref 98–111)
Creatinine, Ser: 2.69 mg/dL — ABNORMAL HIGH (ref 0.61–1.24)
GFR calc Af Amer: 26 mL/min — ABNORMAL LOW (ref >60)
GFR calc non Af Amer: 23 mL/min — ABNORMAL LOW (ref >60)
Glucose, Bld: 194 mg/dL — ABNORMAL HIGH (ref 70–99)
Potassium: 4.2 mmol/L (ref 3.5–5.1)
Sodium: 133 mmol/L — ABNORMAL LOW (ref 135–145)
Total Bilirubin: 1.4 mg/dL — ABNORMAL HIGH (ref 0.3–1.2)
Total Protein: 7.7 g/dL (ref 6.5–8.1)

## 2018-09-13 LAB — DIFFERENTIAL
Basophils Absolute: 0 10*3/uL (ref 0.0–0.1)
Basophils Relative: 0 %
Eosinophils Absolute: 0 10*3/uL (ref 0.0–0.5)
Eosinophils Relative: 0 %
Lymphocytes Relative: 6 %
Lymphs Abs: 1.3 10*3/uL (ref 0.7–4.0)
Monocytes Absolute: 1.7 10*3/uL — ABNORMAL HIGH (ref 0.1–1.0)
Monocytes Relative: 8 %
Neutro Abs: 17.9 10*3/uL — ABNORMAL HIGH (ref 1.7–7.7)
Neutrophils Relative %: 83 %

## 2018-09-13 LAB — CBC
HCT: 35.9 % — ABNORMAL LOW (ref 39.0–52.0)
Hemoglobin: 12.7 g/dL — ABNORMAL LOW (ref 13.0–17.0)
MCH: 27.7 pg (ref 26.0–34.0)
MCHC: 35.4 g/dL (ref 30.0–36.0)
MCV: 78.2 fL — ABNORMAL LOW (ref 80.0–100.0)
Platelets: 121 10*3/uL — ABNORMAL LOW (ref 150–400)
RBC: 4.59 MIL/uL (ref 4.22–5.81)
RDW: 16.6 % — ABNORMAL HIGH (ref 11.5–15.5)
WBC: 21.2 10*3/uL — ABNORMAL HIGH (ref 4.0–10.5)
nRBC: 0 % (ref 0.0–0.2)

## 2018-09-13 LAB — LIPID PANEL
Cholesterol: 66 mg/dL (ref 0–200)
HDL: 10 mg/dL — ABNORMAL LOW (ref >40)
LDL Cholesterol: 24 mg/dL (ref 0–99)
Total CHOL/HDL Ratio: 6.6 RATIO
Triglycerides: 162 mg/dL — ABNORMAL HIGH (ref <150)
VLDL: 32 mg/dL (ref 0–40)

## 2018-09-13 LAB — I-STAT TROPONIN, ED: Troponin i, poc: 0.02 ng/mL (ref 0.00–0.08)

## 2018-09-13 LAB — I-STAT CHEM 8, ED
BUN: 40 mg/dL — ABNORMAL HIGH (ref 8–23)
Calcium, Ion: 1.11 mmol/L — ABNORMAL LOW (ref 1.15–1.40)
Chloride: 99 mmol/L (ref 98–111)
Creatinine, Ser: 2.7 mg/dL — ABNORMAL HIGH (ref 0.61–1.24)
Glucose, Bld: 196 mg/dL — ABNORMAL HIGH (ref 70–99)
HCT: 38 % — ABNORMAL LOW (ref 39.0–52.0)
Hemoglobin: 12.9 g/dL — ABNORMAL LOW (ref 13.0–17.0)
Potassium: 4.2 mmol/L (ref 3.5–5.1)
Sodium: 132 mmol/L — ABNORMAL LOW (ref 135–145)
TCO2: 24 mmol/L (ref 22–32)

## 2018-09-13 LAB — HEMOGLOBIN A1C
Hgb A1c MFr Bld: 7.7 % — ABNORMAL HIGH (ref 4.8–5.6)
Mean Plasma Glucose: 174.29 mg/dL

## 2018-09-13 LAB — LIPASE, BLOOD: Lipase: 32 U/L (ref 11–51)

## 2018-09-13 LAB — I-STAT CG4 LACTIC ACID, ED
Lactic Acid, Venous: 2.41 mmol/L (ref 0.5–1.9)
Lactic Acid, Venous: 1.26 mmol/L (ref 0.5–1.9)

## 2018-09-13 MED ORDER — SODIUM CHLORIDE 0.9 % IV BOLUS (SEPSIS)
1000.0000 mL | Freq: Once | INTRAVENOUS | Status: DC
  Administered 2018-09-13: 1000 mL via INTRAVENOUS

## 2018-09-13 MED ORDER — SODIUM CHLORIDE 0.9 % IV SOLN
1.0000 g | Freq: Two times a day (BID) | INTRAVENOUS | Status: DC
  Administered 2018-09-13 – 2018-09-15 (×4): 1 g via INTRAVENOUS
  Filled 2018-09-13 (×5): qty 1

## 2018-09-13 MED ORDER — POLYETHYLENE GLYCOL 3350 17 G PO PACK: 17.0000 g | PACK | Freq: Every day | ORAL | Status: DC | PRN

## 2018-09-13 MED ORDER — FINASTERIDE 5 MG PO TABS
5.0000 mg | ORAL_TABLET | Freq: Every day | ORAL | Status: DC
  Administered 2018-09-15 – 2018-09-18 (×4): 5 mg via ORAL
  Filled 2018-09-13 (×4): qty 1

## 2018-09-13 MED ORDER — SODIUM CHLORIDE 0.9 % IV SOLN
2.0000 g | Freq: Once | INTRAVENOUS | Status: AC
  Administered 2018-09-13: 2 g via INTRAVENOUS
  Filled 2018-09-13: qty 2

## 2018-09-13 MED ORDER — METRONIDAZOLE IN NACL 5-0.79 MG/ML-% IV SOLN
500.0000 mg | Freq: Three times a day (TID) | INTRAVENOUS | Status: DC
  Administered 2018-09-13 – 2018-09-14 (×3): 500 mg via INTRAVENOUS
  Filled 2018-09-13 (×3): qty 100

## 2018-09-13 MED ORDER — SODIUM CHLORIDE 0.9 % IV BOLUS (SEPSIS)
1000.0000 mL | Freq: Once | INTRAVENOUS | Status: AC
  Administered 2018-09-13: 1000 mL via INTRAVENOUS

## 2018-09-13 MED ORDER — OXYCODONE HCL 5 MG PO TABS
5.0000 mg | ORAL_TABLET | ORAL | Status: DC | PRN
  Administered 2018-09-15 (×2): 5 mg via ORAL
  Filled 2018-09-13 (×2): qty 1

## 2018-09-13 MED ORDER — ACETAMINOPHEN 325 MG PO TABS
650.0000 mg | ORAL_TABLET | Freq: Once | ORAL | Status: AC
  Administered 2018-09-13: 650 mg via ORAL
  Filled 2018-09-13: qty 2

## 2018-09-13 MED ORDER — INSULIN ASPART 100 UNIT/ML ~~LOC~~ SOLN
0.0000 [IU] | Freq: Three times a day (TID) | SUBCUTANEOUS | Status: DC
  Administered 2018-09-14 – 2018-09-15 (×4): 2 [IU] via SUBCUTANEOUS
  Administered 2018-09-15 – 2018-09-16 (×3): 1 [IU] via SUBCUTANEOUS
  Administered 2018-09-16 – 2018-09-17 (×4): 2 [IU] via SUBCUTANEOUS
  Administered 2018-09-17: 1 [IU] via SUBCUTANEOUS
  Administered 2018-09-18: 2 [IU] via SUBCUTANEOUS

## 2018-09-13 MED ORDER — ONDANSETRON HCL 4 MG/2ML IJ SOLN: 4.0000 mg | Freq: Four times a day (QID) | INTRAMUSCULAR | Status: DC | PRN

## 2018-09-13 MED ORDER — HEPARIN SODIUM (PORCINE) 5000 UNIT/ML IJ SOLN
5000.0000 [IU] | Freq: Three times a day (TID) | INTRAMUSCULAR | Status: DC
  Administered 2018-09-13 – 2018-09-14 (×4): 5000 [IU] via SUBCUTANEOUS
  Filled 2018-09-13 (×4): qty 1

## 2018-09-13 MED ORDER — LATANOPROST 0.005 % OP SOLN
1.0000 [drp] | Freq: Every day | OPHTHALMIC | Status: DC
  Administered 2018-09-13 – 2018-09-17 (×5): 1 [drp] via OPHTHALMIC
  Filled 2018-09-13: qty 2.5

## 2018-09-13 MED ORDER — INSULIN ASPART 100 UNIT/ML ~~LOC~~ SOLN
0.0000 [IU] | Freq: Every day | SUBCUTANEOUS | Status: DC
  Administered 2018-09-13: 2 [IU] via SUBCUTANEOUS

## 2018-09-13 MED ORDER — SODIUM CHLORIDE 0.9 % IV BOLUS (SEPSIS)
400.0000 mL | Freq: Once | INTRAVENOUS | Status: AC
  Administered 2018-09-13: 400 mL via INTRAVENOUS

## 2018-09-13 MED ORDER — SODIUM CHLORIDE 0.9 % IV BOLUS (SEPSIS): 2200.0000 mL | Freq: Once | INTRAVENOUS | Status: DC

## 2018-09-13 MED ORDER — SODIUM CHLORIDE 0.9 % IV SOLN
INTRAVENOUS | Status: DC
  Administered 2018-09-13: 19:00:00 via INTRAVENOUS

## 2018-09-13 NOTE — ED Notes (Signed)
Pt has urinal and is aware of need for urin specimen

## 2018-09-13 NOTE — Progress Notes (Signed)
Patient arrived on unit via stretcher from ED.  Wife at bedside.  Telemetry placed per MD order and CMT notified.  

## 2018-09-13 NOTE — ED Notes (Signed)
Ultrasound at the bedside

## 2018-09-13 NOTE — ED Triage Notes (Signed)
Pt was seen at PCP this morning and called and told to go to Ed due to elevated WBC.  Pt took Laxative to help with constipation.

## 2018-09-13 NOTE — ED Notes (Signed)
Attempted to call report to floor.  Nurse unavailable.  Will call back in 5 minutes

## 2018-09-13 NOTE — ED Provider Notes (Signed)
[facility] DEPT Provider Note   CSN: [id number] Arrival date & time: 09/13/18  1350     History   Chief Complaint Chief Complaint  Patient presents with  . Abnormal Lab    HPI Matthew Ochoa. is a 73 y.o. male.  HPI  Patient is a 73 year old male with a history of atrial fibrillation on Xarelto, stent placement, hypertension, pulmonary embolism, type 2 diabetes mellitus presenting for abnormal lab.  He was seen by his primary care provider today in an ED follow-up visit and his white blood cell count was 18,000.  Patient is also noting that he feels generally weak, and has had some right-sided abdominal pain that is mostly constant, not affected by eating.  He reports it is worse in his upper abdomen.  He reports that his progressively worsening over the past 2 days.  Patient ports he has been taking MiraLAX prescribed by the emergency department, and having daily soft stools.  Last bowel movement this morning, without diarrhea.  Patient denies any melena or hematochezia that he has noticed.  Patient denies dysuria, urgency, or frequency.  Patient has any nausea, vomiting.  Patient does report he has low appetite.  Patient reports he has been afebrile at home, and has not had any night sweats or chills.  No abdominal surgical history.  Patient reports he last ate in the 1-2 o'clock hour today, half an egg. Last dose of Xarelto this morning.   Past Medical History:  Diagnosis Date  . Abdominal pain, right upper quadrant   . Allergic rhinitis   . Asthma    as child  . Atrial enlargement, left y-2  . Benign localized hyperplasia of prostate with urinary obstruction and other lower urinary tract symptoms (LUTS)(600.21)   . Calculus of gallbladder without mention of cholecystitis or obstruction   . COPD (chronic obstructive pulmonary disease) ([place])   . Diabetes mellitus   . DVT (deep venous thrombosis) ([place])   . Hematuria, unspecified   . HTN  (hypertension)   . Hyperlipidemia   . Hypertension   . Pulmonary embolism ([place])   . Sleep apnea    NPSG 03-17-98, cpap 14  . Type 2 diabetes mellitus [facility])     Patient Active Problem List   Diagnosis Date Noted  . Toenail fungus 09/26/2014  . Chronic anticoagulation 05/21/2013  . Diabetes mellitus type 2 with complications ([place]) 40/98/1191  . Patellar tendinitis 02/20/2013  . Knee strain 02/20/2013  . HTN (hypertension), malignant 02/20/2013  . Frozen shoulder syndrome 02/20/2013  . Degenerative joint disease of acromioclavicular joint 02/20/2013  . Chondromalacia of patella 01/24/2013  . Other and unspecified hyperlipidemia 01/24/2013  . Pulmonary embolus ([place]) 01/24/2013  . CAD (coronary artery disease) of artery bypass graft 01/24/2013  . Chronic diastolic CHF (congestive heart failure), NYHA class 1 ([place]) 01/24/2013  . Pulmonary embolism ([place]) 07/02/2012  . Atrial tachycardia ([place]) 07/02/2012  . Hyperlipidemia 07/02/2012  . CAD (coronary artery disease) 07/02/2012  . Cholelithiasis 12/27/2011  . COPD mixed type ([place]) 09/05/2011  . Dyspnea on exertion 08/01/2011  . Diabetes mellitus type 2, controlled ([place]) 02/04/2008  . OBSTRUCTIVE SLEEP APNEA 01/22/2008  . Essential hypertension 01/22/2008  . ALLERGIC RHINITIS 01/22/2008    Past Surgical History:  Procedure Laterality Date  . ANGIOPLASTY     stent  . COLONOSCOPY    . CORONARY ANGIOPLASTY WITH STENT PLACEMENT    . POLYPECTOMY          Home Medications  Prior to Admission medications   Medication Sig Start Date End Date Taking? Authorizing Provider  acetaminophen (TYLENOL) 325 MG tablet Take 325-650 mg by mouth every 6 (six) hours as needed for mild pain or headache.   Yes [provider]  amiodarone (PACERONE) 200 MG tablet Take 100 mg by mouth every Monday, Wednesday, and Friday.  02/22/15  Yes [provider]  amLODipine (NORVASC) 5 MG tablet Take 1 tablet (5 mg total) by mouth daily.  07/06/16  Yes Micheline Chapman, NP  aspirin 81 MG chewable tablet Chew 81 mg by mouth daily.   Yes [provider]  atorvastatin (LIPITOR) 40 MG tablet TAKE 1 TABLET( 40 MG TOTAL) BY MOUTH EVERY EVENING Patient taking differently: Take 40 mg by mouth daily at 6 PM. TAKE 1 TABLET( 40 MG TOTAL) BY MOUTH EVERY EVENING 07/26/16  Yes Micheline Chapman, NP  finasteride (PROSCAR) 5 MG tablet Take 5 mg by mouth daily.  10/04/14  Yes [provider]  furosemide (LASIX) 40 MG tablet Take 40 mg by mouth daily.  03/16/17  Yes [provider]  glimepiride (AMARYL) 4 MG tablet Take 4 mg by mouth daily with breakfast.  03/22/17  Yes [provider]  latanoprost (XALATAN) 0.005 % ophthalmic solution Place 1 drop into both eyes at bedtime.   Yes [provider]  losartan (COZAAR) 100 MG tablet Take 100 mg by mouth daily. 02/09/17  Yes [provider]  metFORMIN (GLUCOPHAGE) 500 MG tablet Take 1 tablet (500 mg total) by mouth 3 (three) times daily. Patient taking differently: Take 500-1,000 mg by mouth 2 (two) times daily. 1000mg  in the Morning. 500mg  in the afternoon. 03/19/15  Yes Micheline Chapman, NP  nitroGLYCERIN (NITROSTAT) 0.4 MG SL tablet Place 0.4 mg under the tongue every 5 (five) minutes as needed. For chest pain   Yes [provider]  rivaroxaban (XARELTO) 20 MG TABS tablet Take 1 tablet (20 mg total) by mouth daily with supper. 12/18/14  Yes [name], FNP  hydrochlorothiazide (HYDRODIURIL) 25 MG tablet Take 1 tablet (25 mg total) by mouth daily. Patient not taking: Reported on 09/11/2018 04/09/16   Micheline Chapman, NP    Family History Family History  Problem Relation Age of Onset  . Lung cancer Father   . Asthma Sister   . Breast cancer Sister   . Colon cancer Neg Hx   . Rectal cancer Neg Hx   . Stomach cancer Neg Hx     Social History Social History   Tobacco Use  . Smoking status: Former Smoker    Packs/day: 1.00     Years: 30.00    Pack years: 30.00    Types: Cigarettes    Last attempt to quit: 04/13/2011    Years since quitting: 7.4  . Smokeless tobacco: Never Used  Substance Use Topics  . Alcohol use: Yes    Comment: Socially  . Drug use: No     Allergies   Lisinopril   Review of Systems Review of Systems  Constitutional: Positive for fatigue. Negative for chills, diaphoresis and fever.  HENT: Negative for congestion and sore throat.   Eyes: Negative for visual disturbance.  Respiratory: Negative for cough, chest tightness and shortness of breath.   Cardiovascular: Negative for chest pain, palpitations and leg swelling.  Gastrointestinal: Positive for abdominal pain. Negative for constipation, diarrhea, nausea and vomiting.  Genitourinary: Negative for dysuria and flank pain.  Musculoskeletal: Negative for back pain and myalgias.  Skin: Negative for rash.  Neurological: Positive for light-headedness. Negative for dizziness, syncope and headaches.     Physical Exam Updated Vital Signs BP (!) 76/48 (BP Location: Left Arm)   Pulse 92   Temp (!) 102.1 F (38.9 C) (Rectal)   Resp (!) 21   Ht 5' 10.5" (1.791 m)   Wt 129.3 kg   SpO2 94%   BMI 40.32 kg/m   Physical Exam Vitals signs and nursing note reviewed.  Constitutional:      General: He is not in acute distress.    Appearance: He is well-developed. He is obese. He is diaphoretic. He is not toxic-appearing.  HENT:     Head: Normocephalic and atraumatic.     Mouth/Throat:     Mouth: Mucous membranes are dry.  Eyes:     Conjunctiva/sclera: Conjunctivae normal.     Pupils: Pupils are equal, round, and reactive to light.  Neck:     Musculoskeletal: Normal range of motion and neck supple.  Cardiovascular:     Rate and Rhythm: Normal rate. Rhythm irregular.     Heart sounds: S1 normal and S2 normal. No murmur.     Comments: No LE edema.  Irregularly irregular rhythm.  Pulmonary:     Effort: Pulmonary effort is normal.      Breath sounds: Normal breath sounds. No wheezing or rales.  Abdominal:     General: Bowel sounds are normal. There is no distension.     Palpations: Abdomen is soft.     Tenderness: There is abdominal tenderness. There is no guarding or rebound.     Comments: Protuberant abdomen. Tenderness palpation in the right upper quadrant.  Negative Murphy sign.  Musculoskeletal: Normal range of motion.        General: No deformity.  Lymphadenopathy:     Cervical: No cervical adenopathy.  Skin:    General: Skin is warm.     Findings: No erythema or rash.  Neurological:     Mental Status: He is alert.     Comments: Cranial nerves grossly intact. Patient moves extremities symmetrically and with good coordination.  Psychiatric:        Behavior: Behavior normal.        Thought Content: Thought content normal.        Judgment: Judgment normal.      ED Treatments / Results  Labs (all labs ordered are listed, but only abnormal results are displayed) Labs Reviewed  I-STAT CG4 LACTIC ACID, ED - Abnormal; Notable for the following components:      Result Value   Lactic Acid, Venous 2.41 (*)    All other components within normal limits  I-STAT CHEM 8, ED - Abnormal; Notable for the following components:   Sodium 132 (*)    BUN 40 (*)    Creatinine, Ser 2.70 (*)    Glucose, Bld 196 (*)    Calcium, Ion 1.11 (*)    Hemoglobin 12.9 (*)    HCT 38.0 (*)    All other components within normal limits  CULTURE, BLOOD (ROUTINE X 2)  CULTURE, BLOOD (ROUTINE X 2)  LIPASE, BLOOD  COMPREHENSIVE METABOLIC PANEL  CBC  URINALYSIS, ROUTINE W REFLEX MICROSCOPIC  I-STAT TROPONIN, ED    EKG None  ED ECG REPORT   Date: 09/13/2018  Rate: 97  Rhythm: normal sinus rhythm  QRS Axis: normal  Intervals: Poor R wave progression  ST/T Wave abnormalities: normal  Conduction Disutrbances:none  Narrative Interpretation:   I have personally reviewed  the EKG tracing and agree with the computerized printout as  noted. Confirmed by Dr. Winfred Leeds.    Radiology No results found.  Procedures Procedures (including critical care time) CRITICAL CARE Performed by: Albesa Seen   Total critical care time: 35 minutes  Critical care time was exclusive of separately billable procedures and treating other patients.  Critical care was necessary to treat or prevent imminent or life-threatening deterioration.  Critical care was time spent personally by me on the following activities: development of treatment plan with patient and/or surrogate as well as nursing, discussions with consultants, evaluation of patient's response to treatment, examination of patient, obtaining history from patient or surrogate, ordering and performing treatments and interventions, ordering and review of laboratory studies, ordering and review of radiographic studies, pulse oximetry and re-evaluation of patient's condition.   Medications Ordered in ED Medications  metroNIDAZOLE (FLAGYL) IVPB 500 mg (has no administration in time range)  sodium chloride 0.9 % bolus 1,000 mL (has no administration in time range)    And  sodium chloride 0.9 % bolus 1,000 mL (has no administration in time range)    And  sodium chloride 0.9 % bolus 400 mL (has no administration in time range)     Initial Impression / Assessment and Plan / ED Course  I have reviewed the triage vital signs and the nursing notes.  Pertinent labs & imaging results that were available during my care of the patient were reviewed by me and considered in my medical decision making (see chart for details).  Clinical Course as of Sep 13 1636  Wed Sep 13, 2018  1452 Fluids administered based on IBW of 73.2 kg.    [AM]  1453 30 cc/kilo bolus altered due to   Lactic Acid, Venous(!!): 2.41 [AM]  1453 Creatinine(!): 2.70 [AM]  1535 MAP improving with fluid.   MAP (mmHg): 65 [AM]  1546 WBC(!): 21.2 [AM]  6237 Demonstrates acute cholecystitis.   US Abdomen Limited  RUQ [AM]  1632 Spoke with Dr. Harlow Asa of surgery who states that surgery will evaluate tomorrow. Recommends to medicine team to hold Xarelto.   [AM]    Clinical Course User Index [AM] Albesa Seen, PA-C    Patient is critically ill and requiring a higher level of care. Sepsis suspected. Code sepsis called. Patient meeting SIRS criteria based on fever, hypotension, leukocytosis. See vitals below. Suspected source of infection intraabdominal based on exam and CT scan 2 days ago. Lactic acid 2.41. Signs of end organ dysfunction include AKI.  Vitals:   09/13/18 1432 09/13/18 1440 09/13/18 1443 09/13/18 1453  BP:   (!) 90/42 (!) 76/48  Pulse:    92  Resp:    (!) 21  Temp:  (!) 102.1 F (38.9 C)    TempSrc:  Rectal    SpO2:    94%  Weight: 129.3 kg     Height: 5' 10.5" (1.791 m)        Broad spectrum antibiotics initiated based on suspected source of infection. 30 cc/kilo fluid bolus administered due to hypotension (calculated based on IBW).  Patient is signs of endorgan dysfunction with AKI.  4:22 PM Right upper quadrant ultrasound is demonstrating acute cholecystitis.  Will consult surgery.   Sepsis - Repeat Assessment  Performed at:    4:38 PM  Vitals     Blood pressure 100/63, pulse 87, temperature (!) 102.1 F (38.9 C), temperature source Rectal, resp. rate (!) 24, height 5' 10.5" (1.791 m), weight 129.3 kg,  SpO2 93 %.  Heart:     Irregular rate and rhythm and nontachycardic  Lungs:    CTA  Capillary Refill:   <2 sec  Peripheral Pulse:   Dorsalis pedis pulse  palpable  Skin:     Normal Color   Spoke with hospitalist Dr. Nevada Crane who has decided to admit patient to a high level of care. Appreciate their involvement.   Final Clinical Impressions(s) / ED Diagnoses   Final diagnoses:  RUQ pain  Sepsis with acute renal failure without septic shock, due to unspecified organism, unspecified acute renal failure type [facility])  Acute cholecystitis    ED Discharge Orders     None       Tamala Julian 09/13/18 1657    Orlie Dakin, MD 09/13/18 [phone]

## 2018-09-13 NOTE — Progress Notes (Signed)
Tech checked BS at 945p. BS was 239. BS level not showing up in system.  2 units of insulin given. Secretary notified. Will continue to monitor.

## 2018-09-13 NOTE — Progress Notes (Signed)
A consult was received from an ED physician for cefepime per pharmacy dosing.  The patient's profile has been reviewed for ht/wt/allergies/indication/available labs.   A one time order has been placed for cefepime 2 gm.    Further antibiotics/pharmacy consults should be ordered by admitting physician if indicated.                       Thank you, Eudelia Bunch, Pharm.D 09/13/2018 3:24 PM

## 2018-09-13 NOTE — ED Notes (Signed)
Dr. Alfonso Patten [facility] nurse phones to tell us that they are sending pt. Per p.o.v. He has ruq pain + 18k WBC count + some confusion.

## 2018-09-13 NOTE — Progress Notes (Signed)
Report given to [place], RN.  All questions answered. 

## 2018-09-13 NOTE — ED Provider Notes (Addendum)
Patient complains of right-sided abdominal pain for the past 3 days.  Pain is constant only when "you press on the area".  No nausea or vomiting.  No known fever.  No other complaint.  On exam he is alert Glasgow Coma Score 15 lungs clear to auscultation heart regular rate and rhythm abdomen obese, tender at right upper quadrant without guarding rigidity or rebound negative Murphy sign   Code sepsis called on sirs criteria temperature, blood pressure.  Source of infection felt to be intra-abdominal.  ED ECG REPORT   Date: 09/13/2018  Rate: 95  Rhythm: normal sinus rhythm  QRS Axis: left  Intervals: normal  ST/T Wave abnormalities: nonspecific T wave changes  Conduction Disutrbances:none  Narrative Interpretation:   Old EKG Reviewed: unchanged No significant change from 09/11/2018 I have personally reviewed the EKG tracing and agree with the computerized printout as noted.   Orlie Dakin, MD 09/13/18 1454    Orlie Dakin, MD 09/13/18 [phone]

## 2018-09-13 NOTE — H&P (Signed)
History and Physical  Matthew Ochoa. KXF:[id number] DOB: 04-02-1946 DOA: 09/13/2018  Referring physician: Dr Matthew Ochoa  PCP: Matthew Carol, MD  Outpatient Specialists: None Patient coming from: Home  Chief Complaint: Right upper quadrant pain  HPI: Matthew Ochoa. is a 73 y.o. male with medical history significant for hypertension, type 2 diabetes, paroxysmal A. fib on Xarelto, diastolic CHF, who presented to [facility] ED with complaints of persistent abdominal pain for 1 week duration.  Went to his primary care provider today where he was found to be febrile and was advised to report to the ED for further evaluation.  Associated with poor oral intake and dyspnea with minimal exertion.  Denies any chills or night sweats at home.  Denies nausea, vomiting or diarrhea.  ED Course: Upon presentation to the ED, patient is febrile with T-max of 102.1.  Lab studies remarkable for leukocytosis with WBC of 21,000, lactic acid 2.41, elevated ALT and total bilirubin.  Right upper quadrant ultrasound revealed findings consistent with acute cholecystitis.  ED physician consulted general surgery for possible surgical intervention.  TR[facility] asked to admit for medical management.  Review of Systems: Review of systems as noted in the HPI. All other systems reviewed and are negative.   Past Medical History:  Diagnosis Date  . Abdominal pain, right upper quadrant   . Allergic rhinitis   . Asthma    as child  . Atrial enlargement, left y-2  . Benign localized hyperplasia of prostate with urinary obstruction and other lower urinary tract symptoms (LUTS)(600.21)   . Calculus of gallbladder without mention of cholecystitis or obstruction   . COPD (chronic obstructive pulmonary disease) ([place])   . Diabetes mellitus   . DVT (deep venous thrombosis) ([place])   . Hematuria, unspecified   . HTN (hypertension)   . Hyperlipidemia   . Hypertension   . Pulmonary embolism ([place])   . Sleep apnea    NPSG 03-17-98, cpap 14  .  Type 2 diabetes mellitus ([place])    Past Surgical History:  Procedure Laterality Date  . ANGIOPLASTY     stent  . COLONOSCOPY    . CORONARY ANGIOPLASTY WITH STENT PLACEMENT    . POLYPECTOMY      Social History:  reports that he quit smoking about 7 years ago. His smoking use included cigarettes. He has a 30.00 pack-year smoking history. He has never used smokeless tobacco. He reports current alcohol use. He reports that he does not use drugs.   Allergies  Allergen Reactions  . Lisinopril Cough    Family History  Problem Relation Age of Onset  . Lung cancer Father   . Asthma Sister   . Breast cancer Sister   . Colon cancer Neg Hx   . Rectal cancer Neg Hx   . Stomach cancer Neg Hx     Mother and sister with history of type 2 diabetes.  Prior to Admission medications   Medication Sig Start Date End Date Taking? Authorizing Provider  acetaminophen (TYLENOL) 325 MG tablet Take 325-650 mg by mouth every 6 (six) hours as needed for mild pain or headache.   Yes [provider]  amiodarone (PACERONE) 200 MG tablet Take 100 mg by mouth every Monday, Wednesday, and Friday.  02/22/15  Yes [provider]  amLODipine (NORVASC) 5 MG tablet Take 1 tablet (5 mg total) by mouth daily. 07/06/16  Yes Micheline Chapman, NP  aspirin 81 MG chewable tablet Chew 81 mg by mouth daily.   Yes [provider]  atorvastatin (LIPITOR) 40 MG tablet TAKE 1 TABLET( 40 MG TOTAL) BY MOUTH EVERY EVENING Patient taking differently: Take 40 mg by mouth daily at 6 PM. TAKE 1 TABLET( 40 MG TOTAL) BY MOUTH EVERY EVENING 07/26/16  Yes Micheline Chapman, NP  finasteride (PROSCAR) 5 MG tablet Take 5 mg by mouth daily.  10/04/14  Yes [provider]  furosemide (LASIX) 40 MG tablet Take 40 mg by mouth daily.  03/16/17  Yes [provider]  glimepiride (AMARYL) 4 MG tablet Take 4 mg by mouth daily with breakfast.  03/22/17  Yes [provider]  latanoprost (XALATAN) 0.005  % ophthalmic solution Place 1 drop into both eyes at bedtime.   Yes [provider]  losartan (COZAAR) 100 MG tablet Take 100 mg by mouth daily. 02/09/17  Yes [provider]  metFORMIN (GLUCOPHAGE) 500 MG tablet Take 1 tablet (500 mg total) by mouth 3 (three) times daily. Patient taking differently: Take 500-1,000 mg by mouth 2 (two) times daily. 1000mg  in the Morning. 500mg  in the afternoon. 03/19/15  Yes Micheline Chapman, NP  nitroGLYCERIN (NITROSTAT) 0.4 MG SL tablet Place 0.4 mg under the tongue every 5 (five) minutes as needed. For chest pain   Yes [provider]  rivaroxaban (XARELTO) 20 MG TABS tablet Take 1 tablet (20 mg total) by mouth daily with supper. 12/18/14  Yes [name], FNP  hydrochlorothiazide (HYDRODIURIL) 25 MG tablet Take 1 tablet (25 mg total) by mouth daily. Patient not taking: Reported on 09/11/2018 04/09/16   Micheline Chapman, NP    Physical Exam: BP (!) 122/92   Pulse 89   Temp 98.6 F (37 C) (Oral)   Resp 18   Ht 5' 10.5" (1.791 m)   Wt 129.3 kg   SpO2 92%   BMI 40.32 kg/m   . General: 73 y.o. year-old male well developed well nourished in no acute distress.  Alert and oriented x3. . Cardiovascular: Regular rate and rhythm with no rubs or gallops.  No thyromegaly or JVD noted.  No lower extremity edema. 2/4 pulses in all 4 extremities. Marland Kitchen Respiratory: Clear to auscultation with no wheezes or rales. Good inspiratory effort. . Abdomen: Obese.  Right upper quadrant tenderness on palpation.  Nondistended with normal bowel sounds x4 quadrants. . Muskuloskeletal: No cyanosis, clubbing or edema noted bilaterally . Neuro: CN II-XII intact, strength, sensation, reflexes . Skin: No ulcerative lesions noted or rashes . Psychiatry: Judgement and insight appear normal. Mood is appropriate for condition and setting          Labs on Admission:  Basic Metabolic Panel: Recent Labs  Lab 09/11/18 0622 09/13/18 1438 09/13/18 1445  NA  135 133* 132*  K 3.7 4.2 4.2  CL 102 99 99  CO2 21* 22  --   GLUCOSE 230* 194* 196*  BUN 15 33* 40*  CREATININE 1.01 2.69* 2.70*  CALCIUM 9.6 8.7*  --    Liver Function Tests: Recent Labs  Lab 09/11/18 0622 09/13/18 1438  AST 29 31  ALT 35 50*  ALKPHOS 38 46  BILITOT 0.7 1.4*  PROT 8.1 7.7  ALBUMIN 4.6 3.8   Recent Labs  Lab 09/11/18 0622 09/13/18 1438  LIPASE 47 32   No results for input(s): AMMONIA in the last 168 hours. CBC: Recent Labs  Lab 09/11/18 0622 09/13/18 1438 09/13/18 1445  WBC 11.3* 21.2*  --   HGB 13.7 12.7* 12.9*  HCT 39.4 35.9* 38.0*  MCV  78.5* 78.2*  --   PLT 180 121*  --    Cardiac Enzymes: Recent Labs  Lab 09/11/18 0622  TROPONINI <0.03    BNP (last 3 results) Recent Labs    09/11/18 0622  BNP 31.6    ProBNP (last 3 results) No results for input(s): PROBNP in the last 8760 hours.  CBG: No results for input(s): GLUCAP in the last 168 hours.  Radiological Exams on Admission: Dg Chest 1 View  Result Date: 09/13/2018 CLINICAL DATA:  Elevated white blood cell count today. EXAM: CHEST  1 VIEW COMPARISON:  Single-view of the chest 09/11/2018. PA and lateral chest 10/25/2015. CT chest 10/25/2015. FINDINGS: The lungs are clear. Heart size is upper normal. No pneumothorax or pleural effusion. Aortic atherosclerosis is noted. No acute or focal bony abnormality. IMPRESSION: No acute disease. Atherosclerosis. Electronically Signed   By: Inge Rise M.D.   On: 09/13/2018 15:11   US Abdomen Limited Ruq  Result Date: 09/13/2018 CLINICAL DATA:  Right upper quadrant pain EXAM: ULTRASOUND ABDOMEN LIMITED RIGHT UPPER QUADRANT COMPARISON:  CT 09/11/2018 FINDINGS: Gallbladder: Multiple gallstones measuring up to 1.9 cm. Gallbladder wall thickening 5.5 mm. Positive sonographic Murphy sign. Mild pericholecystic fluid. Common bile duct: Diameter: Not adequately visualized for measurement. No biliary dilatation on recent CT Liver: Liver is extremely  echogenic and difficult to evaluate by ultrasound. Fatty liver on CT. No focal lesion in the liver. Portal vein is patent on color Doppler imaging with normal direction of blood flow towards the liver. IMPRESSION: Cholelithiasis. Findings suggestive of acute cholecystitis with gallbladder wall thickening, positive sonographic Murphy sign, and pericholecystic fluid. Very echogenic liver compatible with fatty infiltration. Electronically Signed   By: Franchot Gallo M.D.   On: 09/13/2018 16:01    EKG: I independently viewed the EKG done and my findings are as followed: Sinus rhythm rate of 86 with nonspecific ST-T changes.  Assessment/Plan Present on Admission: . Acute cholecystitis  Active Problems:   Acute cholecystitis  Sepsis secondary to acute cholecystitis Presented with WBC 21,000, T-max of 102.1, lactic acid 2.41 Started on IV cefepime and IV Flagyl in the ED, continue Obtain blood cultures x2 peripherally Monitor fever curve and WBC Repeat CBC in the morning Monitor vital signs closely Elevated ALT and elevated total bilirubin Repeat CMP in the morning Obtain HIDA scan General surgery has been consulted by ED physician Pain management as needed  AKI suspect prerenal secondary to dehydration versus sepsis with hypotension Baseline creatinine 1.0 with GFR greater than 60 Presented with creatinine of 2.69 with GFR of 26 Avoid nephrotoxic agents/dehydration/hypotension Repeat BMP in the morning Monitor urine output Start gentle IV fluid hydration normal saline at 50 cc/h x1 L  Hypovolemic hyponatremia Presented with sodium of 133 Gentle IV fluid hydration 50 cc/h Hold diuretics Monitor urine output Repeat chemistry panel in the morning  Chronic diastolic CHF Hold diuretics Strict I's and O's and daily weight Hold antihypertensives Appears hypovolemic  Type 2 diabetes Hold metformin and glimepiride Start insulin sliding scale Obtain A1c Avoid hypoglycemia Heart  healthy diabetic diet  Hypertension Hold antihypertensive Blood pressures currently soft Gentle IV fluid hydration  Hyperlipidemia Hold Lipitor for now Obtain lipid panel  Paroxysmal A. fib on Xarelto Defer to general surgery to hold Xarelto  Risks: High risk for decompensation due to sepsis secondary to acute cholecystitis, may require surgery, multiple comorbidities and advanced age.  Patient will require at least 2 midnights for further evaluation and treatment of present condition.   DVT prophylaxis:  On Xarelto  Code Status: Full code  Family Communication: Wife at bedside  Disposition Plan: Admit to telemetry unit  Consults called: General surgery by ED physician  Admission status: Inpatient status    Kayleen Memos MD [facility] Pager [phone]  If 7PM-7AM, please contact night-coverage [email or web address] Password [email or web address]  09/13/2018, 5:42 PM

## 2018-09-14 ENCOUNTER — Encounter (HOSPITAL_COMMUNITY): Payer: Self-pay | Admitting: General Surgery

## 2018-09-14 LAB — COMPREHENSIVE METABOLIC PANEL
ALT: 37 U/L (ref 0–44)
AST: 19 U/L (ref 15–41)
Albumin: 3.7 g/dL (ref 3.5–5.0)
Alkaline Phosphatase: 45 U/L (ref 38–126)
Anion gap: 11 (ref 5–15)
BUN: 37 mg/dL — ABNORMAL HIGH (ref 8–23)
CO2: 20 mmol/L — ABNORMAL LOW (ref 22–32)
Calcium: 8 mg/dL — ABNORMAL LOW (ref 8.9–10.3)
Chloride: 100 mmol/L (ref 98–111)
Creatinine, Ser: 2.04 mg/dL — ABNORMAL HIGH (ref 0.61–1.24)
GFR calc Af Amer: 37 mL/min — ABNORMAL LOW (ref >60)
GFR calc non Af Amer: 32 mL/min — ABNORMAL LOW (ref >60)
Glucose, Bld: 186 mg/dL — ABNORMAL HIGH (ref 70–99)
Potassium: 3.7 mmol/L (ref 3.5–5.1)
Sodium: 131 mmol/L — ABNORMAL LOW (ref 135–145)
Total Bilirubin: 1.5 mg/dL — ABNORMAL HIGH (ref 0.3–1.2)
Total Protein: 7.1 g/dL (ref 6.5–8.1)

## 2018-09-14 LAB — GLUCOSE, CAPILLARY
Glucose-Capillary: 158 mg/dL — ABNORMAL HIGH (ref 70–99)
Glucose-Capillary: 239 mg/dL — ABNORMAL HIGH (ref 70–99)
Glucose-Capillary: 176 mg/dL — ABNORMAL HIGH (ref 70–99)
Glucose-Capillary: 151 mg/dL — ABNORMAL HIGH (ref 70–99)
Glucose-Capillary: 156 mg/dL — ABNORMAL HIGH (ref 70–99)
Glucose-Capillary: 148 mg/dL — ABNORMAL HIGH (ref 70–99)

## 2018-09-14 LAB — CBC
HCT: 31.8 % — ABNORMAL LOW (ref 39.0–52.0)
Hemoglobin: 11.4 g/dL — ABNORMAL LOW (ref 13.0–17.0)
MCH: 27.5 pg (ref 26.0–34.0)
MCHC: 35.8 g/dL (ref 30.0–36.0)
MCV: 76.8 fL — ABNORMAL LOW (ref 80.0–100.0)
Platelets: 97 10*3/uL — ABNORMAL LOW (ref 150–400)
RBC: 4.14 MIL/uL — ABNORMAL LOW (ref 4.22–5.81)
RDW: 16.8 % — ABNORMAL HIGH (ref 11.5–15.5)
WBC: 18.3 10*3/uL — ABNORMAL HIGH (ref 4.0–10.5)
nRBC: 0 % (ref 0.0–0.2)

## 2018-09-14 MED ORDER — METRONIDAZOLE IN NACL 5-0.79 MG/ML-% IV SOLN
500.0000 mg | Freq: Three times a day (TID) | INTRAVENOUS | Status: DC
  Administered 2018-09-14 – 2018-09-15 (×3): 500 mg via INTRAVENOUS
  Filled 2018-09-14 (×3): qty 100

## 2018-09-14 MED ORDER — LORAZEPAM 2 MG/ML IJ SOLN: 1.0000 mg | Freq: Once | INTRAMUSCULAR | Status: DC

## 2018-09-14 MED ORDER — ACETAMINOPHEN 325 MG PO TABS
650.0000 mg | ORAL_TABLET | ORAL | Status: DC | PRN
  Administered 2018-09-14 – 2018-09-15 (×3): 650 mg via ORAL
  Filled 2018-09-14 (×3): qty 2

## 2018-09-14 NOTE — Consult Note (Signed)
Reason for Consult: Cardiac clearance for laparoscopy cholecystectomy Referring Physician: [facility] hospitalist  Matthew Ochoa. is an 73 y.o. male.  HPI: Patient is 73 year old male with past medical history significant for coronary artery disease status post PTCA stenting to distal RCA in August 2012, hypertension, diabetes mellitus, hyperlipidemia, history of atrial tachyarrhythmias, history of pulmonary embolism and DVT in the past, morbid obesity, obstructive sleep apnea, was admitted because of abdominal pain associated with high-grade fever and was noted to have acute cholecystitis.  Cardiology consultation is called for surgical clearance.  Patient denies any anginal chest pain or shortness of breath.  Denies palpitation lightheadedness or syncope.  Denies PND orthopnea or leg swelling.  Patient states he plays golf twice a week 18 holes without any chest pains.  Past Medical History:  Diagnosis Date  . Abdominal pain, right upper quadrant   . Allergic rhinitis   . Asthma    as child  . Atrial enlargement, left y-2  . Benign localized hyperplasia of prostate with urinary obstruction and other lower urinary tract symptoms (LUTS)(600.21)   . Calculus of gallbladder without mention of cholecystitis or obstruction   . COPD (chronic obstructive pulmonary disease) ([place])   . Diabetes mellitus   . DVT (deep venous thrombosis) ([place])   . Hematuria, unspecified   . HTN (hypertension)   . Hyperlipidemia   . Hypertension   . Pulmonary embolism ([place])   . Sleep apnea    NPSG 03-17-98, cpap 14  . Type 2 diabetes mellitus ([place])     Past Surgical History:  Procedure Laterality Date  . ANGIOPLASTY     stent  . COLONOSCOPY    . CORONARY ANGIOPLASTY WITH STENT PLACEMENT    . POLYPECTOMY      Family History  Problem Relation Age of Onset  . Lung cancer Father   . Asthma Sister   . Breast cancer Sister   . Colon cancer Neg Hx   . Rectal cancer Neg Hx   . Stomach cancer Neg Hx     Social  History:  reports that he quit smoking about 7 years ago. His smoking use included cigarettes. He has a 60.00 pack-year smoking history. He has never used smokeless tobacco. He reports previous alcohol use. He reports that he does not use drugs.  Allergies:  Allergies  Allergen Reactions  . Lisinopril Cough    Medications: I have reviewed the patient's current medications.  Results for orders placed or performed during the hospital encounter of 09/13/18 (from the past 48 hour(s))  Urinalysis, Routine w reflex microscopic     Status: Abnormal   Collection Time: 09/13/18  2:10 PM  Result Value Ref Range   Color, Urine AMBER (A) YELLOW    Comment: BIOCHEMICALS MAY BE AFFECTED BY COLOR   APPearance CLOUDY (A) CLEAR   Specific Gravity, Urine 1.017 1.005 - 1.030   pH 5.0 5.0 - 8.0   Glucose, UA NEGATIVE NEGATIVE mg/dL   Hgb urine dipstick MODERATE (A) NEGATIVE   Bilirubin Urine NEGATIVE NEGATIVE   Ketones, ur NEGATIVE NEGATIVE mg/dL   Protein, ur 100 (A) NEGATIVE mg/dL   Nitrite NEGATIVE NEGATIVE   Leukocytes, UA LARGE (A) NEGATIVE   RBC / HPF 6-10 0 - 5 RBC/hpf   WBC, UA >50 (H) 0 - 5 WBC/hpf   Bacteria, UA MANY (A) NONE SEEN   Squamous Epithelial / LPF 0-5 0 - 5   WBC Clumps PRESENT    Mucus PRESENT    Hyaline Casts, UA  PRESENT    Granular Casts, UA PRESENT     Comment: Performed at [facility], [place] [address] [place], [place] [address]  Blood Culture (routine x 2)     Status: None (Preliminary result)   Collection Time: 09/13/18  2:30 PM  Result Value Ref Range   Specimen Description      BLOOD LEFT ANTECUBITAL Performed at [facility] [address] [place], [place] [address]    Special Requests      BOTTLES DRAWN AEROBIC AND ANAEROBIC Blood Culture adequate volume Performed at [facility] [address] [place], [place] [address]    Culture      NO GROWTH < 24 HOURS Performed at [facility] [address], [place], [place] [address]    Report Status PENDING   Lipase, blood     Status: None   Collection Time: 09/13/18  2:38 PM  Result Value Ref Range   Lipase 32 11 - 51 U/L    Comment: Performed at [facility], [place] [address], [place], [place] [address]  Comprehensive metabolic panel     Status: Abnormal   Collection Time: 09/13/18  2:38 PM  Result Value Ref Range   Sodium 133 (L) 135 - 145 mmol/L   Potassium 4.2 3.5 - 5.1 mmol/L   Chloride 99 98 - 111 mmol/L   CO2 22 22 - 32 mmol/L   Glucose, Bld 194 (H) 70 - 99 mg/dL   BUN 33 (H) 8 - 23 mg/dL   Creatinine, Ser 2.69 (H) 0.61 - 1.24 mg/dL   Calcium 8.7 (L) 8.9 - 10.3 mg/dL   Total Protein 7.7 6.5 - 8.1 g/dL   Albumin 3.8 3.5 - 5.0 g/dL   AST 31 15 - 41 U/L   ALT 50 (H) 0 - 44 U/L   Alkaline Phosphatase 46 38 - 126 U/L   Total Bilirubin 1.4 (H) 0.3 - 1.2 mg/dL   GFR calc non Af Amer 23 (L) >60 mL/min   GFR calc Af Amer 26 (L) >60 mL/min   Anion gap 12 5 - 15    Comment: Performed at [facility], [place] [address] [place],  [address]  CBC     Status: Abnormal   Collection Time: 09/13/18  2:38 PM  Result Value Ref Range   WBC 21.2 (H) 4.0 - 10.5 K/uL   RBC 4.59 4.22 - 5.81 MIL/uL   Hemoglobin 12.7 (L) 13.0 - 17.0 g/dL   HCT 35.9 (L) 39.0 - 52.0 %   MCV 78.2 (L) 80.0 - 100.0 fL   MCH 27.7 26.0 - 34.0 pg   MCHC 35.4 30.0 - 36.0 g/dL   RDW 16.6 (H) 11.5 - 15.5 %   Platelets 121 (L) 150 - 400 K/uL   nRBC 0.0 0.0 - 0.2 %    Comment: Performed at [facility], [place] [address] [place],  [address]  Differential     Status: Abnormal   Collection Time: 09/13/18  2:38 PM  Result Value Ref Range   Neutrophils Relative % 83 %   Neutro Abs 17.9 (H) 1.7 - 7.7 K/uL   Lymphocytes Relative 6 %   Lymphs Abs 1.3 0.7 - 4.0 K/uL   Monocytes Relative 8 %   Monocytes Absolute 1.7 (H) 0.1 - 1.0 K/uL   Eosinophils Relative 0 %   Eosinophils Absolute 0.0 0.0 - 0.5  K/uL   Basophils Relative 0 %  Basophils Absolute 0.0 0.0 - 0.1 K/uL   WBC Morphology VACUOLATED NEUTROPHILS    Polychromasia PRESENT    Target Cells PRESENT     Comment: Performed at [facility], [place] [address], [place], [place] [address]  Blood Culture (routine x 2)     Status: None (Preliminary result)   Collection Time: 09/13/18  2:40 PM  Result Value Ref Range   Specimen Description      BLOOD RIGHT HAND Performed at [facility] [address] [place], [place] [address]    Special Requests      BOTTLES DRAWN AEROBIC AND ANAEROBIC Blood Culture adequate volume Performed at [facility] [address] [place], [place] [address]    Culture      NO GROWTH < 24 HOURS Performed at [facility] [address], [place], [place] [address]    Report Status PENDING   I-Stat CG4 Lactic Acid, ED     Status: Abnormal   Collection Time: 09/13/18  2:45 PM  Result Value Ref Range   Lactic Acid, Venous 2.41 (HH) 0.5 - 1.9 mmol/L   Comment NOTIFIED PHYSICIAN   I-stat Chem 8, ED     Status: Abnormal   Collection Time: 09/13/18  2:45 PM  Result Value Ref Range   Sodium 132 (L) 135 - 145 mmol/L   Potassium 4.2 3.5 - 5.1 mmol/L   Chloride 99 98 - 111 mmol/L   BUN 40 (H) 8 - 23 mg/dL   Creatinine, Ser 2.70 (H) 0.61 - 1.24 mg/dL   Glucose, Bld 196 (H) 70 - 99 mg/dL   Calcium, Ion 1.11 (L) 1.15 - 1.40 mmol/L   TCO2 24 22 - 32 mmol/L   Hemoglobin 12.9 (L) 13.0 - 17.0 g/dL   HCT 38.0 (L) 39.0 - 52.0 %  I-Stat Troponin, ED (not at [facility])     Status: None   Collection Time: 09/13/18  2:49 PM  Result Value Ref Range   Troponin i, poc 0.02 0.00 - 0.08 ng/mL   Comment 3            Comment: Due to the release kinetics of cTnI, a negative result within the first hours of the onset of symptoms does not rule out myocardial infarction with certainty. If myocardial infarction is still suspected, repeat the test at appropriate  intervals.   I-Stat CG4 Lactic Acid, ED     Status: None   Collection Time: 09/13/18  5:24 PM  Result Value Ref Range   Lactic Acid, Venous 1.26 0.5 - 1.9 mmol/L  Hemoglobin A1c     Status: Abnormal   Collection Time: 09/13/18  6:27 PM  Result Value Ref Range   Hgb A1c MFr Bld 7.7 (H) 4.8 - 5.6 %    Comment: (NOTE) Pre diabetes:          5.7%-6.4% Diabetes:              >6.4% Glycemic control for   <7.0% adults with diabetes    Mean Plasma Glucose 174.29 mg/dL    Comment: Performed at [facility] [address] [place], [place] [address]  Lipid panel     Status: Abnormal   Collection Time: 09/13/18  6:27 PM  Result Value Ref Range   Cholesterol 66 0 - 200 mg/dL   Triglycerides 162 (H) <150 mg/dL   HDL 10 (L) >40 mg/dL   Total CHOL/HDL Ratio 6.6 RATIO   VLDL 32 0 -  40 mg/dL   LDL Cholesterol 24 0 - 99 mg/dL    Comment:        Total Cholesterol/HDL:CHD Risk Coronary Heart Disease Risk Table                     Men   Women  1/2 Average Risk   3.4   3.3  Average Risk       5.0   4.4  2 X Average Risk   9.6   7.1  3 X Average Risk  23.4   11.0        Use the calculated Patient Ratio above and the CHD Risk Table to determine the patient's CHD Risk.        ATP III CLASSIFICATION (LDL):  <100     mg/dL   Optimal  100-129  mg/dL   Near or Above                    Optimal  130-159  mg/dL   Borderline  160-189  mg/dL   High  >190     mg/dL   Very High Performed at [facility] [address] [place], [place] [address]   Glucose, capillary     Status: Abnormal   Collection Time: 09/13/18  7:16 PM  Result Value Ref Range   Glucose-Capillary 158 (H) 70 - 99 mg/dL  Glucose, capillary     Status: Abnormal   Collection Time: 09/13/18  9:19 PM  Result Value Ref Range   Glucose-Capillary 239 (H) 70 - 99 mg/dL  CBC     Status: Abnormal   Collection Time: 09/14/18  6:26 AM  Result Value Ref Range   WBC 18.3 (H) 4.0 - 10.5 K/uL   RBC 4.14 (L) 4.22  - 5.81 MIL/uL   Hemoglobin 11.4 (L) 13.0 - 17.0 g/dL   HCT 31.8 (L) 39.0 - 52.0 %   MCV 76.8 (L) 80.0 - 100.0 fL   MCH 27.5 26.0 - 34.0 pg   MCHC 35.8 30.0 - 36.0 g/dL   RDW 16.8 (H) 11.5 - 15.5 %   Platelets 97 (L) 150 - 400 K/uL    Comment: Immature Platelet Fraction may be clinically indicated, consider ordering this additional test [id number]    nRBC 0.0 0.0 - 0.2 %    Comment: Performed at [facility], [place] [address] [place], [place] [address]  Comprehensive metabolic panel     Status: Abnormal   Collection Time: 09/14/18  6:26 AM  Result Value Ref Range   Sodium 131 (L) 135 - 145 mmol/L   Potassium 3.7 3.5 - 5.1 mmol/L   Chloride 100 98 - 111 mmol/L   CO2 20 (L) 22 - 32 mmol/L   Glucose, Bld 186 (H) 70 - 99 mg/dL   BUN 37 (H) 8 - 23 mg/dL   Creatinine, Ser 2.04 (H) 0.61 - 1.24 mg/dL   Calcium 8.0 (L) 8.9 - 10.3 mg/dL   Total Protein 7.1 6.5 - 8.1 g/dL   Albumin 3.7 3.5 - 5.0 g/dL   AST 19 15 - 41 U/L   ALT 37 0 - 44 U/L   Alkaline Phosphatase 45 38 - 126 U/L   Total Bilirubin 1.5 (H) 0.3 - 1.2 mg/dL   GFR calc non Af Amer 32 (L) >60 mL/min   GFR calc Af Amer 37 (L) >60 mL/min   Anion gap 11 5 - 15    Comment: Performed at [facility]  [place] [address] [place], [place] [address]  Glucose, capillary     Status: Abnormal   Collection Time: 09/14/18  7:28 AM  Result Value Ref Range   Glucose-Capillary 176 (H) 70 - 99 mg/dL  Glucose, capillary     Status: Abnormal   Collection Time: 09/14/18 11:29 AM  Result Value Ref Range   Glucose-Capillary 151 (H) 70 - 99 mg/dL  Glucose, capillary     Status: Abnormal   Collection Time: 09/14/18  5:15 PM  Result Value Ref Range   Glucose-Capillary 156 (H) 70 - 99 mg/dL    Dg Chest 1 View  Result Date: 09/13/2018 CLINICAL DATA:  Elevated white blood cell count today. EXAM: CHEST  1 VIEW COMPARISON:  Single-view of the chest 09/11/2018. PA and lateral chest 10/25/2015. CT chest 10/25/2015.  FINDINGS: The lungs are clear. Heart size is upper normal. No pneumothorax or pleural effusion. Aortic atherosclerosis is noted. No acute or focal bony abnormality. IMPRESSION: No acute disease. Atherosclerosis. Electronically Signed   By: Inge Rise M.D.   On: 09/13/2018 15:11   US Abdomen Limited Ruq  Result Date: 09/13/2018 CLINICAL DATA:  Right upper quadrant pain EXAM: ULTRASOUND ABDOMEN LIMITED RIGHT UPPER QUADRANT COMPARISON:  CT 09/11/2018 FINDINGS: Gallbladder: Multiple gallstones measuring up to 1.9 cm. Gallbladder wall thickening 5.5 mm. Positive sonographic Murphy sign. Mild pericholecystic fluid. Common bile duct: Diameter: Not adequately visualized for measurement. No biliary dilatation on recent CT Liver: Liver is extremely echogenic and difficult to evaluate by ultrasound. Fatty liver on CT. No focal lesion in the liver. Portal vein is patent on color Doppler imaging with normal direction of blood flow towards the liver. IMPRESSION: Cholelithiasis. Findings suggestive of acute cholecystitis with gallbladder wall thickening, positive sonographic Murphy sign, and pericholecystic fluid. Very echogenic liver compatible with fatty infiltration. Electronically Signed   By: Franchot Gallo M.D.   On: 09/13/2018 16:01    Review of Systems  Constitutional: Positive for fever.  HENT: Negative for hearing loss.   Eyes: Negative for blurred vision.  Respiratory: Negative for cough, hemoptysis, sputum production and shortness of breath.   Cardiovascular: Negative for chest pain, palpitations and orthopnea.  Gastrointestinal: Positive for abdominal pain and constipation.  Genitourinary: Negative for dysuria.  Musculoskeletal: Negative for myalgias.  Skin: Negative for rash.  Neurological: Negative for dizziness.   Blood pressure 130/67, pulse 75, temperature 99.5 F (37.5 C), temperature source Oral, resp. rate (!) 22, height 5' 10.5" (1.791 m), weight 129.3 kg, SpO2 94 %. Physical Exam   Constitutional: He is oriented to person, place, and time. He appears well-developed and well-nourished.  HENT:  Head: Normocephalic and atraumatic.  Eyes: Pupils are equal, round, and reactive to light. Conjunctivae are normal. Left eye exhibits no discharge. No scleral icterus.  Neck: Normal range of motion. Neck supple. No JVD present. No tracheal deviation present. No thyromegaly present.  Cardiovascular: Normal rate and regular rhythm. Exam reveals no friction rub.  No murmur heard. Respiratory: Effort normal and breath sounds normal. No respiratory distress. He has no wheezes. He has no rales.  GI: Soft. Bowel sounds are normal. He exhibits distension. There is abdominal tenderness (Right upper quadrant tenderness noted no guarding). There is no rebound and no guarding.  Musculoskeletal:     Comments: No clubbing cyanosis trace edema noted  Neurological: He is alert and oriented to person, place, and time.    Assessment/Plan: Acute cholecystitis Coronary artery disease history of PCI to RCA in remote past  stable Hypertension Hyperlipidemia Diabetes mellitus History of paroxysmal atrial fibrillation/atrial tachycardia/SVT in the past History of pulmonary embolism in the past History of DVT in the past Morbid obesity Obstructive sleep apnea on CPAP Acute kidney injury secondary to hypotension/dehydration Plan Acceptable risk for laparoscopic cholecystectomy from cardiac point of view Agree with present management Monitor renal function closely Patient is off Xarelto for more than 3 days now. Charolette Forward 09/14/2018, 5:55 PM

## 2018-09-14 NOTE — Progress Notes (Signed)
Pt last dose of Xarelto was Tuesday 09/06/2018.

## 2018-09-14 NOTE — Consult Note (Signed)
Matthew Flake Jr. [birth year]  [id number].    Requesting MD: Dr. Irene Pap Chief Complaint/Reason for Consult: cholecystitis  HPI:  This is a pleasant 73 year old black male with a past medical history including COPD, coronary artery disease status post stenting x1, atrial fibrillation on Xarelto, history of DVT/PE, hypertension, and diabetes mellitus who began having epigastric and right upper quadrant abdominal pain on Sunday.  The patient denies any fevers at home or nausea and vomiting.  He denied any diarrhea.  He only complained of abdominal pain.  He presented to the emergency department on Monday with a work-up showing a white blood cell count of 11,000 and a CT scan showing some mild wall thickening of the gallbladder and gallstones, but was discharged home as his pain seemed to improve after morphine.  At home the patient's pain continued to worsen.  He did not eat much between Sunday and today.  His urine became quite dark.  He saw his primary care physician yesterday who took labs and noted his white blood cell count to be over 20,000.  He was instructed to return to the emergency department.  Upon arrival he was noted to have a white blood cell count of 21,000 this time as well as some hypotension and a fever of 102.  He had a right upper quadrant abdominal ultrasound that revealed cholelithiasis with other findings suggestive of cholecystitis including gallbladder wall thickening as well as a sonographic Murphy sign and pericholecystic fluid.  The patient was admitted to the medical service and started on antibiotic therapy.  The patient states the last time he took his Xarelto was either Monday or Tuesday night.  He has not had it since then.  We have been asked to evaluate the patient for further recommendations.  He denies any chest pain or shortness of breath.  He does not have his CPAP machine here and therefore had some difficulty sleeping last night, but otherwise no other  complaints.  ROS: ROS: Please see HPI, otherwise all other systems have been reviewed and are negative.  Family History  Problem Relation Age of Onset  . Lung cancer Father   . Asthma Sister   . Breast cancer Sister   . Colon cancer Neg Hx   . Rectal cancer Neg Hx   . Stomach cancer Neg Hx     Past Medical History:  Diagnosis Date  . Abdominal pain, right upper quadrant   . Allergic rhinitis   . Asthma    as child  . Atrial enlargement, left y-2  . Benign localized hyperplasia of prostate with urinary obstruction and other lower urinary tract symptoms (LUTS)(600.21)   . Calculus of gallbladder without mention of cholecystitis or obstruction   . COPD (chronic obstructive pulmonary disease) ([place])   . Diabetes mellitus   . DVT (deep venous thrombosis) ([place])   . Hematuria, unspecified   . HTN (hypertension)   . Hyperlipidemia   . Hypertension   . Pulmonary embolism ([place])   . Sleep apnea    NPSG 03-17-98, cpap 14  . Type 2 diabetes mellitus ([place])     Past Surgical History:  Procedure Laterality Date  . ANGIOPLASTY     stent  . COLONOSCOPY    . CORONARY ANGIOPLASTY WITH STENT PLACEMENT    . POLYPECTOMY      Social History:  reports that he quit smoking about 7 years ago. His smoking use included cigarettes. He has a 60.00 pack-year smoking history. He has  never used smokeless tobacco. He reports previous alcohol use. He reports that he does not use drugs.  Allergies:  Allergies  Allergen Reactions  . Lisinopril Cough    Medications Prior to Admission  Medication Sig Dispense Refill  . acetaminophen (TYLENOL) 325 MG tablet Take 325-650 mg by mouth every 6 (six) hours as needed for mild pain or headache.    Marland Kitchen amiodarone (PACERONE) 200 MG tablet Take 100 mg by mouth every Monday, Wednesday, and Friday.   2  . amLODipine (NORVASC) 5 MG tablet Take 1 tablet (5 mg total) by mouth daily. 30 tablet 2  . aspirin 81 MG chewable tablet Chew 81 mg by mouth daily.    Marland Kitchen  atorvastatin (LIPITOR) 40 MG tablet TAKE 1 TABLET( 40 MG TOTAL) BY MOUTH EVERY EVENING (Patient taking differently: Take 40 mg by mouth daily at 6 PM. TAKE 1 TABLET( 40 MG TOTAL) BY MOUTH EVERY EVENING) 30 tablet 3  . finasteride (PROSCAR) 5 MG tablet Take 5 mg by mouth daily.   5  . furosemide (LASIX) 40 MG tablet Take 40 mg by mouth daily.   3  . glimepiride (AMARYL) 4 MG tablet Take 4 mg by mouth daily with breakfast.   8  . latanoprost (XALATAN) 0.005 % ophthalmic solution Place 1 drop into both eyes at bedtime.    Marland Kitchen losartan (COZAAR) 100 MG tablet Take 100 mg by mouth daily.  2  . metFORMIN (GLUCOPHAGE) 500 MG tablet Take 1 tablet (500 mg total) by mouth 3 (three) times daily. (Patient taking differently: Take 500-1,000 mg by mouth 2 (two) times daily. 1000mg  in the Morning. 500mg  in the afternoon.) 90 tablet 3  . nitroGLYCERIN (NITROSTAT) 0.4 MG SL tablet Place 0.4 mg under the tongue every 5 (five) minutes as needed. For chest pain    . rivaroxaban (XARELTO) 20 MG TABS tablet Take 1 tablet (20 mg total) by mouth daily with supper. 30 tablet 5  . hydrochlorothiazide (HYDRODIURIL) 25 MG tablet Take 1 tablet (25 mg total) by mouth daily. (Patient not taking: Reported on 09/11/2018) 90 tablet 1     Physical Exam: Blood pressure 114/63, pulse 79, temperature 99.5 F (37.5 C), temperature source Oral, resp. rate 18, height 5' 10.5" (1.791 m), weight 129.3 kg, SpO2 93 %. General: pleasant, obese, black male who is sitting up in his chair in NAD HEENT: head is normocephalic, atraumatic.  Sclera are noninjected.  PERRL.  Ears and nose without any masses or lesions.  Mouth is pink and moist Heart: irregular.  Normal s1,s2. No obvious murmurs, gallops, or rubs noted.  Palpable radial and pedal pulses bilaterally Lungs: CTAB, no wheezes, rhonchi, or rales noted.  Respiratory effort nonlabored Abd: soft, tender in the right upper quadrant, especially with deep palpation, obese with a tense abdomen,  hypoactive BS, no masses or organomegaly.  Reducible umbilical hernia. MS: all 4 extremities are symmetrical with no cyanosis, clubbing.  Minimal trace edema in bilateral lower extremities. Skin: warm and dry with no masses, lesions, or rashes Psych: A&Ox3 with an appropriate affect.   Results for orders placed or performed during the hospital encounter of 09/13/18 (from the past 48 hour(s))  Urinalysis, Routine w reflex microscopic     Status: Abnormal   Collection Time: 09/13/18  2:10 PM  Result Value Ref Range   Color, Urine AMBER (A) YELLOW    Comment: BIOCHEMICALS MAY BE AFFECTED BY COLOR   APPearance CLOUDY (A) CLEAR   Specific Gravity, Urine 1.017 1.005 -  1.030   pH 5.0 5.0 - 8.0   Glucose, UA NEGATIVE NEGATIVE mg/dL   Hgb urine dipstick MODERATE (A) NEGATIVE   Bilirubin Urine NEGATIVE NEGATIVE   Ketones, ur NEGATIVE NEGATIVE mg/dL   Protein, ur 100 (A) NEGATIVE mg/dL   Nitrite NEGATIVE NEGATIVE   Leukocytes, UA LARGE (A) NEGATIVE   RBC / HPF 6-10 0 - 5 RBC/hpf   WBC, UA >50 (H) 0 - 5 WBC/hpf   Bacteria, UA MANY (A) NONE SEEN   Squamous Epithelial / LPF 0-5 0 - 5   WBC Clumps PRESENT    Mucus PRESENT    Hyaline Casts, UA PRESENT    Granular Casts, UA PRESENT     Comment: Performed at [facility], [place] [address], [place], [place] [address]  Blood Culture (routine x 2)     Status: None (Preliminary result)   Collection Time: 09/13/18  2:30 PM  Result Value Ref Range   Specimen Description      BLOOD LEFT ANTECUBITAL Performed at [facility] [address] [place], [place] [address]    Special Requests      BOTTLES DRAWN AEROBIC AND ANAEROBIC Blood Culture adequate volume Performed at [facility] [address], [place], [place] [address]    Culture      NO GROWTH < 24 HOURS Performed at [facility] [address] [place], [place] [address]    Report Status PENDING   Lipase, blood     Status:  None   Collection Time: 09/13/18  2:38 PM  Result Value Ref Range   Lipase 32 11 - 51 U/L    Comment: Performed at [facility], [place] [address], [place], [place] [address]  Comprehensive metabolic panel     Status: Abnormal   Collection Time: 09/13/18  2:38 PM  Result Value Ref Range   Sodium 133 (L) 135 - 145 mmol/L   Potassium 4.2 3.5 - 5.1 mmol/L   Chloride 99 98 - 111 mmol/L   CO2 22 22 - 32 mmol/L   Glucose, Bld 194 (H) 70 - 99 mg/dL   BUN 33 (H) 8 - 23 mg/dL   Creatinine, Ser 2.69 (H) 0.61 - 1.24 mg/dL   Calcium 8.7 (L) 8.9 - 10.3 mg/dL   Total Protein 7.7 6.5 - 8.1 g/dL   Albumin 3.8 3.5 - 5.0 g/dL   AST 31 15 - 41 U/L   ALT 50 (H) 0 - 44 U/L   Alkaline Phosphatase 46 38 - 126 U/L   Total Bilirubin 1.4 (H) 0.3 - 1.2 mg/dL   GFR calc non Af Amer 23 (L) >60 mL/min   GFR calc Af Amer 26 (L) >60 mL/min   Anion gap 12 5 - 15    Comment: Performed at [facility], [place] [address], [place], [place] [address]  CBC     Status: Abnormal   Collection Time: 09/13/18  2:38 PM  Result Value Ref Range   WBC 21.2 (H) 4.0 - 10.5 K/uL   RBC 4.59 4.22 - 5.81 MIL/uL   Hemoglobin 12.7 (L) 13.0 - 17.0 g/dL   HCT 35.9 (L) 39.0 - 52.0 %   MCV 78.2 (L) 80.0 - 100.0 fL   MCH 27.7 26.0 - 34.0 pg   MCHC 35.4 30.0 - 36.0 g/dL   RDW 16.6 (H) 11.5 - 15.5 %   Platelets 121 (L) 150 - 400 K/uL   nRBC 0.0 0.0 -  0.2 %    Comment: Performed at [facility], [place] [address] [place], [place] [address]  Differential     Status: Abnormal   Collection Time: 09/13/18  2:38 PM  Result Value Ref Range   Neutrophils Relative % 83 %   Neutro Abs 17.9 (H) 1.7 - 7.7 K/uL   Lymphocytes Relative 6 %   Lymphs Abs 1.3 0.7 - 4.0 K/uL   Monocytes Relative 8 %   Monocytes Absolute 1.7 (H) 0.1 - 1.0 K/uL   Eosinophils Relative 0 %   Eosinophils Absolute 0.0 0.0 - 0.5 K/uL   Basophils Relative 0 %   Basophils Absolute 0.0 0.0 - 0.1 K/uL   WBC Morphology  VACUOLATED NEUTROPHILS    Polychromasia PRESENT    Target Cells PRESENT     Comment: Performed at [facility], [place] [address] [place], [place] [address]  Blood Culture (routine x 2)     Status: None (Preliminary result)   Collection Time: 09/13/18  2:40 PM  Result Value Ref Range   Specimen Description      BLOOD RIGHT HAND Performed at [facility] [address] [place], [place] [address]    Special Requests      BOTTLES DRAWN AEROBIC AND ANAEROBIC Blood Culture adequate volume Performed at [facility] [address], [place], [place] [address]    Culture      NO GROWTH < 24 HOURS Performed at [facility] [address] [place], [place] [address]    Report Status PENDING   I-Stat CG4 Lactic Acid, ED     Status: Abnormal   Collection Time: 09/13/18  2:45 PM  Result Value Ref Range   Lactic Acid, Venous 2.41 (HH) 0.5 - 1.9 mmol/L   Comment NOTIFIED PHYSICIAN   I-stat Chem 8, ED     Status: Abnormal   Collection Time: 09/13/18  2:45 PM  Result Value Ref Range   Sodium 132 (L) 135 - 145 mmol/L   Potassium 4.2 3.5 - 5.1 mmol/L   Chloride 99 98 - 111 mmol/L   BUN 40 (H) 8 - 23 mg/dL   Creatinine, Ser 2.70 (H) 0.61 - 1.24 mg/dL   Glucose, Bld 196 (H) 70 - 99 mg/dL   Calcium, Ion 1.11 (L) 1.15 - 1.40 mmol/L   TCO2 24 22 - 32 mmol/L   Hemoglobin 12.9 (L) 13.0 - 17.0 g/dL   HCT 38.0 (L) 39.0 - 52.0 %  I-Stat Troponin, ED (not at [facility])     Status: None   Collection Time: 09/13/18  2:49 PM  Result Value Ref Range   Troponin i, poc 0.02 0.00 - 0.08 ng/mL   Comment 3            Comment: Due to the release kinetics of cTnI, a negative result within the first hours of the onset of symptoms does not rule out myocardial infarction with certainty. If myocardial infarction is still suspected, repeat the test at appropriate intervals.   I-Stat CG4 Lactic Acid, ED     Status: None   Collection Time: 09/13/18  5:24  PM  Result Value Ref Range   Lactic Acid, Venous 1.26 0.5 - 1.9 mmol/L  Hemoglobin A1c     Status: Abnormal   Collection Time: 09/13/18  6:27 PM  Result Value Ref Range   Hgb A1c MFr Bld 7.7 (H) 4.8 - 5.6 %    Comment: (NOTE) Pre diabetes:  5.7%-6.4% Diabetes:              >6.4% Glycemic control for   <7.0% adults with diabetes    Mean Plasma Glucose 174.29 mg/dL    Comment: Performed at [facility] [address] [place], [place] [address]  Lipid panel     Status: Abnormal   Collection Time: 09/13/18  6:27 PM  Result Value Ref Range   Cholesterol 66 0 - 200 mg/dL   Triglycerides 162 (H) <150 mg/dL   HDL 10 (L) >40 mg/dL   Total CHOL/HDL Ratio 6.6 RATIO   VLDL 32 0 - 40 mg/dL   LDL Cholesterol 24 0 - 99 mg/dL    Comment:        Total Cholesterol/HDL:CHD Risk Coronary Heart Disease Risk Table                     Men   Women  1/2 Average Risk   3.4   3.3  Average Risk       5.0   4.4  2 X Average Risk   9.6   7.1  3 X Average Risk  23.4   11.0        Use the calculated Patient Ratio above and the CHD Risk Table to determine the patient's CHD Risk.        ATP III CLASSIFICATION (LDL):  <100     mg/dL   Optimal  100-129  mg/dL   Near or Above                    Optimal  130-159  mg/dL   Borderline  160-189  mg/dL   High  >190     mg/dL   Very High Performed at [facility] [address], [place], [place] [address]   CBC     Status: Abnormal   Collection Time: 09/14/18  6:26 AM  Result Value Ref Range   WBC 18.3 (H) 4.0 - 10.5 K/uL   RBC 4.14 (L) 4.22 - 5.81 MIL/uL   Hemoglobin 11.4 (L) 13.0 - 17.0 g/dL   HCT 31.8 (L) 39.0 - 52.0 %   MCV 76.8 (L) 80.0 - 100.0 fL   MCH 27.5 26.0 - 34.0 pg   MCHC 35.8 30.0 - 36.0 g/dL   RDW 16.8 (H) 11.5 - 15.5 %   Platelets 97 (L) 150 - 400 K/uL    Comment: Immature Platelet Fraction may be clinically indicated, consider ordering this additional test [id number]    nRBC 0.0 0.0 - 0.2 %     Comment: Performed at [facility], [place] [address], [place], [place] [address]  Comprehensive metabolic panel     Status: Abnormal   Collection Time: 09/14/18  6:26 AM  Result Value Ref Range   Sodium 131 (L) 135 - 145 mmol/L   Potassium 3.7 3.5 - 5.1 mmol/L   Chloride 100 98 - 111 mmol/L   CO2 20 (L) 22 - 32 mmol/L   Glucose, Bld 186 (H) 70 - 99 mg/dL   BUN 37 (H) 8 - 23 mg/dL   Creatinine, Ser 2.04 (H) 0.61 - 1.24 mg/dL   Calcium 8.0 (L) 8.9 - 10.3 mg/dL   Total Protein 7.1 6.5 - 8.1 g/dL   Albumin 3.7 3.5 - 5.0 g/dL   AST 19 15 - 41 U/L   ALT 37 0 - 44 U/L   Alkaline Phosphatase 45 38 - 126 U/L  Total Bilirubin 1.5 (H) 0.3 - 1.2 mg/dL   GFR calc non Af Amer 32 (L) >60 mL/min   GFR calc Af Amer 37 (L) >60 mL/min   Anion gap 11 5 - 15    Comment: Performed at [facility], [place] [address] [place], [place] [address]   Dg Chest 1 View  Result Date: 09/13/2018 CLINICAL DATA:  Elevated white blood cell count today. EXAM: CHEST  1 VIEW COMPARISON:  Single-view of the chest 09/11/2018. PA and lateral chest 10/25/2015. CT chest 10/25/2015. FINDINGS: The lungs are clear. Heart size is upper normal. No pneumothorax or pleural effusion. Aortic atherosclerosis is noted. No acute or focal bony abnormality. IMPRESSION: No acute disease. Atherosclerosis. Electronically Signed   By: Inge Rise M.D.   On: 09/13/2018 15:11   US Abdomen Limited Ruq  Result Date: 09/13/2018 CLINICAL DATA:  Right upper quadrant pain EXAM: ULTRASOUND ABDOMEN LIMITED RIGHT UPPER QUADRANT COMPARISON:  CT 09/11/2018 FINDINGS: Gallbladder: Multiple gallstones measuring up to 1.9 cm. Gallbladder wall thickening 5.5 mm. Positive sonographic Murphy sign. Mild pericholecystic fluid. Common bile duct: Diameter: Not adequately visualized for measurement. No biliary dilatation on recent CT Liver: Liver is extremely echogenic and difficult to evaluate by ultrasound. Fatty liver on CT. No focal  lesion in the liver. Portal vein is patent on color Doppler imaging with normal direction of blood flow towards the liver. IMPRESSION: Cholelithiasis. Findings suggestive of acute cholecystitis with gallbladder wall thickening, positive sonographic Murphy sign, and pericholecystic fluid. Very echogenic liver compatible with fatty infiltration. Electronically Signed   By: Franchot Gallo M.D.   On: 09/13/2018 16:01      Assessment/Plan OSA Diabetes mellitus Hypertension Coronary artery disease, status post stenting x1 Atrial fibrillation, on Xarelto History of DVT, PE Acute kidney failure, likely secondary to dehydration and infection COPD  Acute cholecystitis Patient clearly has acute cholecystitis.  We agree with antibiotic therapy.  He does not need any further testing to confirm this.  HIDA scan has been discontinued.  He may have clear liquids today and then we will make him n.p.o. after midnight in case we can proceed with surgical intervention tomorrow.  His Xarelto has been on hold since at least Tuesday night possibly Monday night.  We will ask his cardiologist, Dr. Terrence Dupont, to evaluate him for cardiac clearance.  We will also asked the medical team to clear him from a medical standpoint as well.  If he is cleared, we will tentatively plan for cholecystectomy tomorrow.  This was discussed with the patient as well as his wife and they understand this plan.   FEN - CLD, NPO p MN VTE - heparin, xarelto on hold ID - Cefepime, flagyl  Henreitta Cea, [facility] 09/14/2018, 12:06 PM Pager: [phone]

## 2018-09-14 NOTE — Progress Notes (Signed)
Pt stated he was unable to tolerate our machine or nasal mask and thus, had someone bring in his home CPAP equipment.  Pt will place it on himself when he is ready for bed.  Our machine and equipment removed from room.

## 2018-09-14 NOTE — Evaluation (Signed)
Physical Therapy Evaluation Patient Details Name: Matthew Ochoa. MRN: [id number] DOB: Apr 04, 1946 Today's Date: 09/14/2018   History of Present Illness  73 yo male admitted to ED on 09/11/18 with acute cholecystitis. PMH includes COPD, CAD with stenting, afib, history of DVT and PE, HTN, DM, HLD, sleep apnea.   Clinical Impression   Pt presents with moderate abdominal pain, increased time and effort to perform mobility tasks, slow gait with deviations, and decreased activity tolerance due to pain. Pt to benefit from acute PT to address deficits. Pt ambulated 15 ft in room with supervision assist with no AD, but presented with wide BOS and antalgic gait. PT not recommending PT follow up at this time, pending pt status after surgery tomorrow. PT to reassess next visit. PT to progress mobility as tolerated, and will continue to follow acutely.      Follow Up Recommendations No PT follow up;Supervision for mobility/OOB    Equipment Recommendations  None recommended by PT    Recommendations for Other Services       Precautions / Restrictions Precautions Precautions: Fall Restrictions Weight Bearing Restrictions: No      Mobility  Bed Mobility               General bed mobility comments: Pt up in chair upon PT arrival to room.  Transfers Overall transfer level: Needs assistance Equipment used: None Transfers: Sit to/from Stand Sit to Stand: Supervision         General transfer comment: Supervision for safety. Increased time to rise and use of arm rests to power up.   Ambulation/Gait Ambulation/Gait assistance: Supervision Gait Distance (Feet): 15 Feet Assistive device: None Gait Pattern/deviations: Step-through pattern;Decreased stride length;Wide base of support;Antalgic Gait velocity: decr    General Gait Details: Supervision for safety. Pt with wide BOS and pain in abdomen with ambulation. Pt limiting ambulation to room distance today due to pain.  Stairs             Wheelchair Mobility    Modified Rankin (Stroke Patients Only)       Balance Overall balance assessment: Mild deficits observed, not formally tested                                           Pertinent Vitals/Pain Pain Assessment: 0-10 Pain Score: 6  Pain Location: abdomen, R side Pain Descriptors / Indicators: Dull Pain Intervention(s): Limited activity within patient's tolerance;Repositioned;Monitored during session    Home Living Family/patient expects to be discharged to:: Private residence Living Arrangements: Spouse/significant other Available Help at Discharge: Family;Available PRN/intermittently Type of Home: House Home Access: Stairs to enter Entrance Stairs-Rails: Right Entrance Stairs-Number of Steps: 5 Home Layout: One level Home Equipment: Crutches;Cane - single point;Walker - 2 wheels      Prior Function Level of Independence: Independent         Comments: when asked if pt used AD prior to admission, pt states "every now and again I might need something, but not before coming to the hospital"      Hand Dominance   Dominant Hand: Right    Extremity/Trunk Assessment   Upper Extremity Assessment Upper Extremity Assessment: Overall WFL for tasks assessed    Lower Extremity Assessment Lower Extremity Assessment: Generalized weakness(Pt able to perform AROM hip flexion, knee extension, knee flexion)    Cervical / Trunk Assessment Cervical / Trunk Assessment: Normal  Communication  Communication: No difficulties  Cognition Arousal/Alertness: Awake/alert Behavior During Therapy: Flat affect;WFL for tasks assessed/performed Overall Cognitive Status: Within Functional Limits for tasks assessed                                        General Comments      Exercises     Assessment/Plan    PT Assessment Patient needs continued PT services  PT Problem List Decreased strength;Pain;Decreased activity  tolerance;Decreased mobility       PT Treatment Interventions DME instruction;Therapeutic activities;Gait training;Therapeutic exercise;Patient/family education;Stair training;Balance training;Functional mobility training    PT Goals (Current goals can be found in the Care Plan section)  Acute Rehab PT Goals Patient Stated Goal: decrease abdominal pain  PT Goal Formulation: With patient Time For Goal Achievement: 09/28/18 Potential to Achieve Goals: Good    Frequency Min 3X/week   Barriers to discharge        Co-evaluation               AM-PAC PT "6 Clicks" Mobility  Outcome Measure Help needed turning from your back to your side while in a flat bed without using bedrails?: A Little Help needed moving from lying on your back to sitting on the side of a flat bed without using bedrails?: A Little Help needed moving to and from a bed to a chair (including a wheelchair)?: A Little Help needed standing up from a chair using your arms (e.g., wheelchair or bedside chair)?: None Help needed to walk in hospital room?: None Help needed climbing 3-5 steps with a railing? : A Little 6 Click Score: 20    End of Session   Activity Tolerance: Patient limited by pain Patient left: in chair;with call bell/phone within reach Nurse Communication: Mobility status PT Visit Diagnosis: Other abnormalities of gait and mobility (R26.89);Pain Pain - Right/Left: Right Pain - part of body: (abdomen)    Time: 0814-4818 PT Time Calculation (min) (ACUTE ONLY): 12 min   Charges:   PT Evaluation $PT Eval Low Complexity: 1 Low          Gresia Isidoro Conception Chancy, PT Acute Rehabilitation Services Pager [phone]  Office [phone]   Marvin Maenza D Reata Petrov 09/14/2018, 6:01 PM

## 2018-09-14 NOTE — Progress Notes (Signed)
RN called into room by wife stating patient is breathing hard. O2 95% room air. Repositioned patient. Educated patient and wife that cPap would help patient with breathing at night. Patient refused to wear cPap stating it is uncomfortable and makes him feel claustrophobic. Will continue to monitor patient.

## 2018-09-14 NOTE — Progress Notes (Signed)
PROGRESS NOTE  Matthew Ochoa. TKZ:[id number] DOB: Feb 22, 1946 DOA: 09/13/2018 PCP: Seward Carol, MD  HPI/Recap of past 24 hours: Matthew Ochoa. is a 73 y.o. male with medical history significant for hypertension, type 2 diabetes, paroxysmal A. fib on Xarelto, diastolic CHF, who presented to [facility] ED with complaints of persistent abdominal pain for 1 week duration.  Went to his primary care provider today where he was found to be febrile and was advised to report to the ED for further evaluation.  Associated with poor oral intake and dyspnea with minimal exertion.  Denies any chills or night sweats at home.  Denies nausea, vomiting or diarrhea.  Upon presentation to the ED, patient is febrile with T-max of 102.1.  Lab studies remarkable for leukocytosis with WBC of 21,000, lactic acid 2.41, elevated ALT and total bilirubin.  Right upper quadrant ultrasound revealed findings consistent with acute cholecystitis.  ED physician consulted general surgery for possible surgical intervention.  TRH asked to admit for medical management.  09/14/18: Seen and examined with his wife at bedside.  States he has no pain on his right upper quadrant abdomen unless palpated.  No new complaints.   Assessment/Plan: Active Problems:   Acute cholecystitis  Sepsis secondary to acute cholecystitis Presented with WBC 21,000, T-max of 102.1, lactic acid 2.41 Continue IV cefepime and IV Flagyl  HIDA Scan is pending Cultures x2 peripherally no growth in less than 24 hours General surgery consulted by ED physician  Improving AKI suspect prerenal secondary to dehydration versus sepsis with hypotension Baseline creatinine 1.0 with GFR greater than 60 Presented with creatinine of 2.69 with GFR of 26 Continue today down to 2.04  Continue to avoid nephrotoxic agents/dehydration/hypotension Stop gentle IV fluid hydration 50 cc/h +3.0 L since admission  Hypovolemic hyponatremia Presented with sodium of  133 Holding diuretics due to recent AKI Monitor urine output Repeat chemistry panel in the morning  Chronic diastolic CHF Hold diuretics Strict I's and O's and daily weight Hold antihypertensives Appears hypovolemic  Type 2 diabetes Hold metformin and glimepiride Continue insulin sliding scale Hemoglobin A1c 7.7 Avoid hypoglycemia Heart healthy diabetic diet  Hypertension Hold antihypertensive Blood pressures currently soft Gentle IV fluid hydration  Hyperlipidemia Hold Lipitor for now LDL 24  Paroxysmal A. fib on Xarelto Defer to general surgery to hold Xarelto  Risks: High risk for decompensation due to sepsis secondary to acute cholecystitis, may require surgery, multiple comorbidities and advanced age.  Patient will require at least 2 midnights for further evaluation and treatment of present condition.   DVT prophylaxis: On Xarelto  Code Status: Full code  Family Communication: Wife at bedside  Disposition Plan: Admit to telemetry unit  Consults called: General surgery by ED physician  Admission status: Inpatient status     Objective: Vitals:   09/13/18 1734 09/13/18 1814 09/13/18 1924 09/14/18 0433  BP:  124/73 124/73 114/63  Pulse:  88 88 79  Resp:  18 18 18   Temp: 98.6 F (37 C)  99.6 F (37.6 C) 99.5 F (37.5 C)  TempSrc: Oral  Oral Oral  SpO2:  94% 94% 93%  Weight:      Height:        Intake/Output Summary (Last 24 hours) at 09/14/2018 1047 Last data filed at 09/14/2018 5732 Gross per 24 hour  Intake 3192.57 ml  Output 100 ml  Net 3092.57 ml   Filed Weights   09/13/18 1432  Weight: 129.3 kg    Exam:  . General: 73 y.o.  year-old male morbidly obese in no acute distress.  Alert and oriented x3.   . Cardiovascular: Regular rate and rhythm with no rubs or gallops.  No JVD or thyromegaly noted. [place] Kitchen Respiratory: Clear to auscultation with no wheezes or rales.  Good inspiratory effort. . Abdomen: Soft nontender nondistended  with normal bowel sounds x4 quadrants. . Musculoskeletal: Trace lower extremity edema. 2/4 pulses in all 4 extremities. [place] Kitchen Psychiatry: Mood is appropriate for condition and setting   Data Reviewed: CBC: Recent Labs  Lab 09/11/18 0622 09/13/18 1438 09/13/18 1445 09/14/18 0626  WBC 11.3* 21.2*  --  18.3*  NEUTROABS  --  17.9*  --   --   HGB 13.7 12.7* 12.9* 11.4*  HCT 39.4 35.9* 38.0* 31.8*  MCV 78.5* 78.2*  --  76.8*  PLT 180 121*  --  97*   Basic Metabolic Panel: Recent Labs  Lab 09/11/18 0622 09/13/18 1438 09/13/18 1445 09/14/18 0626  NA 135 133* 132* 131*  K 3.7 4.2 4.2 3.7  CL 102 99 99 100  CO2 21* 22  --  20*  GLUCOSE 230* 194* 196* 186*  BUN 15 33* 40* 37*  CREATININE 1.01 2.69* 2.70* 2.04*  CALCIUM 9.6 8.7*  --  8.0*   GFR: Estimated Creatinine Clearance: 45.2 mL/min (A) (by C-G formula based on SCr of 2.04 mg/dL (H)). Liver Function Tests: Recent Labs  Lab 09/11/18 0622 09/13/18 1438 09/14/18 0626  AST 29 31 19   ALT 35 50* 37  ALKPHOS 38 46 45  BILITOT 0.7 1.4* 1.5*  PROT 8.1 7.7 7.1  ALBUMIN 4.6 3.8 3.7   Recent Labs  Lab 09/11/18 0622 09/13/18 1438  LIPASE 47 32   No results for input(s): AMMONIA in the last 168 hours. Coagulation Profile: No results for input(s): INR, PROTIME in the last 168 hours. Cardiac Enzymes: Recent Labs  Lab 09/11/18 0622  TROPONINI <0.03   BNP (last 3 results) No results for input(s): PROBNP in the last 8760 hours. HbA1C: Recent Labs    09/13/18 1827  HGBA1C 7.7*   CBG: No results for input(s): GLUCAP in the last 168 hours. Lipid Profile: Recent Labs    09/13/18 1827  CHOL 66  HDL 10*  LDLCALC 24  TRIG 162*  CHOLHDL 6.6   Thyroid Function Tests: No results for input(s): TSH, T4TOTAL, FREET4, T3FREE, THYROIDAB in the last 72 hours. Anemia Panel: No results for input(s): VITAMINB12, FOLATE, FERRITIN, TIBC, IRON, RETICCTPCT in the last 72 hours. Urine analysis:    Component Value Date/Time    COLORURINE AMBER (A) 09/13/2018 1410   APPEARANCEUR CLOUDY (A) 09/13/2018 1410   LABSPEC 1.017 09/13/2018 1410   PHURINE 5.0 09/13/2018 1410   GLUCOSEU NEGATIVE 09/13/2018 1410   HGBUR MODERATE (A) 09/13/2018 1410   BILIRUBINUR NEGATIVE 09/13/2018 1410   KETONESUR NEGATIVE 09/13/2018 1410   PROTEINUR 100 (A) 09/13/2018 1410   UROBILINOGEN 1.0 05/13/2015 2204   NITRITE NEGATIVE 09/13/2018 1410   LEUKOCYTESUR LARGE (A) 09/13/2018 1410   Sepsis Labs: @LABRCNTIP (procalcitonin:4,lacticidven:4)  ) Recent Results (from the past 240 hour(s))  Blood Culture (routine x 2)     Status: None (Preliminary result)   Collection Time: 09/13/18  2:30 PM  Result Value Ref Range Status   Specimen Description   Final    BLOOD LEFT ANTECUBITAL Performed at [facility], [place] [address], [place], [place] [address]    Special Requests   Final    BOTTLES DRAWN AEROBIC AND ANAEROBIC Blood Culture adequate volume Performed  at [facility], [place] [address] [place], [place] [address]    Culture   Final    NO GROWTH < 24 HOURS Performed at [place] [address], [place], [place] [address]    Report Status PENDING  Incomplete  Blood Culture (routine x 2)     Status: None (Preliminary result)   Collection Time: 09/13/18  2:40 PM  Result Value Ref Range Status   Specimen Description   Final    BLOOD RIGHT HAND Performed at [place] [address], [place], [place] [address]    Special Requests   Final    BOTTLES DRAWN AEROBIC AND ANAEROBIC Blood Culture adequate volume Performed at [place] [address], [place], [place] [address]    Culture   Final    NO GROWTH < 24 HOURS Performed at [place] [address], [place], [place] [address]    Report Status PENDING  Incomplete      Studies: Dg Chest 1 View  Result Date: 09/13/2018 CLINICAL DATA:  Elevated white blood cell count today.  EXAM: CHEST  1 VIEW COMPARISON:  Single-view of the chest 09/11/2018. PA and lateral chest 10/25/2015. CT chest 10/25/2015. FINDINGS: The lungs are clear. Heart size is upper normal. No pneumothorax or pleural effusion. Aortic atherosclerosis is noted. No acute or focal bony abnormality. IMPRESSION: No acute disease. Atherosclerosis. Electronically Signed   By: Inge Rise M.D.   On: 09/13/2018 15:11   US Abdomen Limited Ruq  Result Date: 09/13/2018 CLINICAL DATA:  Right upper quadrant pain EXAM: ULTRASOUND ABDOMEN LIMITED RIGHT UPPER QUADRANT COMPARISON:  CT 09/11/2018 FINDINGS: Gallbladder: Multiple gallstones measuring up to 1.9 cm. Gallbladder wall thickening 5.5 mm. Positive sonographic Murphy sign. Mild pericholecystic fluid. Common bile duct: Diameter: Not adequately visualized for measurement. No biliary dilatation on recent CT Liver: Liver is extremely echogenic and difficult to evaluate by ultrasound. Fatty liver on CT. No focal lesion in the liver. Portal vein is patent on color Doppler imaging with normal direction of blood flow towards the liver. IMPRESSION: Cholelithiasis. Findings suggestive of acute cholecystitis with gallbladder wall thickening, positive sonographic Murphy sign, and pericholecystic fluid. Very echogenic liver compatible with fatty infiltration. Electronically Signed   By: Franchot Gallo M.D.   On: 09/13/2018 16:01    Scheduled Meds: . finasteride  5 mg Oral Daily  . heparin injection (subcutaneous)  5,000 Units Subcutaneous Q8H  . insulin aspart  0-5 Units Subcutaneous QHS  . insulin aspart  0-9 Units Subcutaneous TID WC  . latanoprost  1 drop Both Eyes QHS  . LORazepam  1 mg Intravenous Once    Continuous Infusions: . sodium chloride 50 mL/hr at 09/13/18 1846  . ceFEPime (MAXIPIME) IV 1 g (09/14/18 1032)  . metronidazole 500 mg (09/14/18 5885)     LOS: 1 day     Kayleen Memos, MD [facility] Pager [phone]  If 7PM-7AM, please contact  night-coverage [email or web address] Password [facility] 09/14/2018, 10:47 AM

## 2018-09-15 ENCOUNTER — Inpatient Hospital Stay (HOSPITAL_COMMUNITY): Payer: Medicare Other

## 2018-09-15 ENCOUNTER — Encounter (HOSPITAL_COMMUNITY): Payer: Self-pay | Admitting: Family Medicine

## 2018-09-15 DIAGNOSIS — K810 Acute cholecystitis: Secondary | ICD-10-CM

## 2018-09-15 HISTORY — PX: IR PERC CHOLECYSTOSTOMY: IMG2326

## 2018-09-15 LAB — CBC
HCT: 29.4 % — ABNORMAL LOW (ref 39.0–52.0)
Hemoglobin: 10.5 g/dL — ABNORMAL LOW (ref 13.0–17.0)
MCH: 27.5 pg (ref 26.0–34.0)
MCHC: 35.7 g/dL (ref 30.0–36.0)
MCV: 77 fL — ABNORMAL LOW (ref 80.0–100.0)
Platelets: 120 10*3/uL — ABNORMAL LOW (ref 150–400)
RBC: 3.82 MIL/uL — ABNORMAL LOW (ref 4.22–5.81)
RDW: 16.8 % — ABNORMAL HIGH (ref 11.5–15.5)
WBC: 18.2 10*3/uL — ABNORMAL HIGH (ref 4.0–10.5)
nRBC: 0 % (ref 0.0–0.2)

## 2018-09-15 LAB — BASIC METABOLIC PANEL
Anion gap: 10 (ref 5–15)
BUN: 29 mg/dL — ABNORMAL HIGH (ref 8–23)
CO2: 20 mmol/L — ABNORMAL LOW (ref 22–32)
Calcium: 8.3 mg/dL — ABNORMAL LOW (ref 8.9–10.3)
Chloride: 104 mmol/L (ref 98–111)
Creatinine, Ser: 1.57 mg/dL — ABNORMAL HIGH (ref 0.61–1.24)
GFR calc Af Amer: 51 mL/min — ABNORMAL LOW (ref >60)
GFR calc non Af Amer: 44 mL/min — ABNORMAL LOW (ref >60)
Glucose, Bld: 169 mg/dL — ABNORMAL HIGH (ref 70–99)
Potassium: 3.6 mmol/L (ref 3.5–5.1)
Sodium: 134 mmol/L — ABNORMAL LOW (ref 135–145)

## 2018-09-15 LAB — GLUCOSE, CAPILLARY
Glucose-Capillary: 157 mg/dL — ABNORMAL HIGH (ref 70–99)
Glucose-Capillary: 143 mg/dL — ABNORMAL HIGH (ref 70–99)
Glucose-Capillary: 131 mg/dL — ABNORMAL HIGH (ref 70–99)
Glucose-Capillary: 151 mg/dL — ABNORMAL HIGH (ref 70–99)

## 2018-09-15 LAB — PROTIME-INR
INR: 1.29
Prothrombin Time: 15.9 seconds — ABNORMAL HIGH (ref 11.4–15.2)

## 2018-09-15 MED ORDER — LIDOCAINE HCL (PF) 1 % IJ SOLN
INTRAMUSCULAR | Status: AC | PRN
  Administered 2018-09-15: 10 mL

## 2018-09-15 MED ORDER — MIDAZOLAM HCL 2 MG/2ML IJ SOLN
INTRAMUSCULAR | Status: AC | PRN
  Administered 2018-09-15 (×2): 1 mg via INTRAVENOUS

## 2018-09-15 MED ORDER — MIDAZOLAM HCL 2 MG/2ML IJ SOLN
INTRAMUSCULAR | Status: AC
  Filled 2018-09-15: qty 4

## 2018-09-15 MED ORDER — FENTANYL CITRATE (PF) 100 MCG/2ML IJ SOLN
INTRAMUSCULAR | Status: AC
  Filled 2018-09-15: qty 2

## 2018-09-15 MED ORDER — PIPERACILLIN-TAZOBACTAM 3.375 G IVPB
INTRAVENOUS | Status: AC
  Administered 2018-09-15: 3.375 g via INTRAVENOUS
  Filled 2018-09-15: qty 50

## 2018-09-15 MED ORDER — RIVAROXABAN 20 MG PO TABS
20.0000 mg | ORAL_TABLET | Freq: Every day | ORAL | Status: DC
  Administered 2018-09-15 – 2018-09-17 (×3): 20 mg via ORAL
  Filled 2018-09-15 (×3): qty 1

## 2018-09-15 MED ORDER — FENTANYL CITRATE (PF) 100 MCG/2ML IJ SOLN
INTRAMUSCULAR | Status: AC | PRN
  Administered 2018-09-15: 50 ug via INTRAVENOUS

## 2018-09-15 MED ORDER — PIPERACILLIN-TAZOBACTAM 3.375 G IVPB 30 MIN
3.3750 g | Freq: Once | INTRAVENOUS | Status: AC
  Administered 2018-09-15: 3.375 g via INTRAVENOUS

## 2018-09-15 MED ORDER — IOPAMIDOL (ISOVUE-300) INJECTION 61%
INTRAVENOUS | Status: AC
  Filled 2018-09-15: qty 50

## 2018-09-15 MED ORDER — IOPAMIDOL (ISOVUE-300) INJECTION 61%: 50.0000 mL | Freq: Once | INTRAVENOUS | Status: DC | PRN

## 2018-09-15 MED ORDER — PIPERACILLIN-TAZOBACTAM 3.375 G IVPB
3.3750 g | Freq: Three times a day (TID) | INTRAVENOUS | Status: DC
  Administered 2018-09-15 – 2018-09-18 (×8): 3.375 g via INTRAVENOUS
  Filled 2018-09-15 (×9): qty 50

## 2018-09-15 MED ORDER — LIDOCAINE HCL 1 % IJ SOLN
INTRAMUSCULAR | Status: AC
  Filled 2018-09-15: qty 20

## 2018-09-15 NOTE — Consult Note (Signed)
Chief Complaint: Patient was seen in consultation today for percutaneous cholecystostomy Chief Complaint  Patient presents with  . Abnormal Lab    Referring Physician(s): Clent Demark  Supervising Physician: Daryll Brod  Patient Status: [facility] - In-pt  History of Present Illness: Matthew Dion. is a 73 y.o. male with multiple medical problems including BPH, CHF,  COPD, diabetes, DVT/PE, hypertension, hyperlipidemia, sleep apnea, coronary artery disease with prior stenting,  paroxysmal atrial fibrillation (on xarelto) who was admitted to [facility] on 09/13/18 with several day history of epigastric/right upper quadrant abdominal pain.  Subsequent imaging has revealed extensive cholelithiasis with slight thickening and edema of the gallbladder wall, pericholecystic fluid, positive sonographic Murphy's sign consistent with acute cholecystitis.  Extensive diverticulosis of the colon and terminal ileum also present. Current labs include WBC 18.2, hemoglobin 10.5, platelets 120k, creatinine 1.57, PT 15.9, INR 1.29, total bilirubin 1.5.  He is afebrile on IV cefepime.  Request now received from surgery for percutaneous cholecystostomy.  Past Medical History:  Diagnosis Date  . Abdominal pain, right upper quadrant   . Allergic rhinitis   . Asthma    as child  . Atrial enlargement, left y-2  . Benign localized hyperplasia of prostate with urinary obstruction and other lower urinary tract symptoms (LUTS)(600.21)   . Calculus of gallbladder without mention of cholecystitis or obstruction   . COPD (chronic obstructive pulmonary disease) ([place])   . Diabetes mellitus   . DVT (deep venous thrombosis) ([place])   . Hematuria, unspecified   . HTN (hypertension)   . Hyperlipidemia   . Hypertension   . Pulmonary embolism ([place])   . Sleep apnea    NPSG 03-17-98, cpap 14  . Type 2 diabetes mellitus ([place])     Past Surgical History:  Procedure Laterality Date  . ANGIOPLASTY     stent  .  COLONOSCOPY    . CORONARY ANGIOPLASTY WITH STENT PLACEMENT    . POLYPECTOMY      Allergies: Lisinopril  Medications: Prior to Admission medications   Medication Sig Start Date End Date Taking? Authorizing Provider  acetaminophen (TYLENOL) 325 MG tablet Take 325-650 mg by mouth every 6 (six) hours as needed for mild pain or headache.   Yes [provider]  amiodarone (PACERONE) 200 MG tablet Take 100 mg by mouth every Monday, Wednesday, and Friday.  02/22/15  Yes [provider]  amLODipine (NORVASC) 5 MG tablet Take 1 tablet (5 mg total) by mouth daily. 07/06/16  Yes Micheline Chapman, NP  aspirin 81 MG chewable tablet Chew 81 mg by mouth daily.   Yes [provider]  atorvastatin (LIPITOR) 40 MG tablet TAKE 1 TABLET( 40 MG TOTAL) BY MOUTH EVERY EVENING Patient taking differently: Take 40 mg by mouth daily at 6 PM. TAKE 1 TABLET( 40 MG TOTAL) BY MOUTH EVERY EVENING 07/26/16  Yes Micheline Chapman, NP  finasteride (PROSCAR) 5 MG tablet Take 5 mg by mouth daily.  10/04/14  Yes [provider]  furosemide (LASIX) 40 MG tablet Take 40 mg by mouth daily.  03/16/17  Yes [provider]  glimepiride (AMARYL) 4 MG tablet Take 4 mg by mouth daily with breakfast.  03/22/17  Yes [provider]  latanoprost (XALATAN) 0.005 % ophthalmic solution Place 1 drop into both eyes at bedtime.   Yes [provider]  losartan (COZAAR) 100 MG tablet Take 100 mg by mouth daily. 02/09/17  Yes [provider]  metFORMIN (GLUCOPHAGE) 500 MG tablet Take 1  tablet (500 mg total) by mouth 3 (three) times daily. Patient taking differently: Take 500-1,000 mg by mouth 2 (two) times daily. 1000mg  in the Morning. 500mg  in the afternoon. 03/19/15  Yes Micheline Chapman, NP  nitroGLYCERIN (NITROSTAT) 0.4 MG SL tablet Place 0.4 mg under the tongue every 5 (five) minutes as needed. For chest pain   Yes [provider]  rivaroxaban (XARELTO) 20 MG TABS  tablet Take 1 tablet (20 mg total) by mouth daily with supper. 12/18/14  Yes [name], FNP  hydrochlorothiazide (HYDRODIURIL) 25 MG tablet Take 1 tablet (25 mg total) by mouth daily. Patient not taking: Reported on 09/11/2018 04/09/16   Micheline Chapman, NP     Family History  Problem Relation Age of Onset  . Lung cancer Father   . Asthma Sister   . Breast cancer Sister   . Colon cancer Neg Hx   . Rectal cancer Neg Hx   . Stomach cancer Neg Hx     Social History   Socioeconomic History  . Marital status: Married    Spouse name: Not on file  . Number of children: 1  . Years of education: Not on file  . Highest education level: Not on file  Occupational History  . Occupation: retired  Scientific laboratory technician  . Financial resource strain: Not on file  . Food insecurity:    Worry: Not on file    Inability: Not on file  . Transportation needs:    Medical: Not on file    Non-medical: Not on file  Tobacco Use  . Smoking status: Former Smoker    Packs/day: 2.00    Years: 30.00    Pack years: 60.00    Types: Cigarettes    Last attempt to quit: 04/13/2011    Years since quitting: 7.4  . Smokeless tobacco: Never Used  Substance and Sexual Activity  . Alcohol use: Not Currently  . Drug use: No  . Sexual activity: Yes  Lifestyle  . Physical activity:    Days per week: Not on file    Minutes per session: Not on file  . Stress: Not on file  Relationships  . Social connections:    Talks on phone: Not on file    Gets together: Not on file    Attends religious service: Not on file    Active member of club or organization: Not on file    Attends meetings of clubs or organizations: Not on file    Relationship status: Not on file  Other Topics Concern  . Not on file  Social History Narrative  . Not on file      Review of Systems currently denies fever, headache, chest pain, dyspnea, cough, back pain, nausea, vomiting or bleeding.  He does have some mild abdominal  tenderness.  Vital Signs: BP 120/70 (BP Location: Left Arm)   Pulse 75   Temp 98.3 F (36.8 C) (Oral)   Resp 20   Ht 5' 10.5" (1.791 m)   Wt 285 lb 15 oz (129.7 kg)   SpO2 95%   BMI 40.45 kg/m   Physical Exam awake, alert.  Chest clear to auscultation bilaterally.  Heart with regular rate and rhythm.  Abdomen protuberant, soft, positive bowel sounds, mildly tender right upper and lower quadrants.  Trace to 1+ pretibial edema bilaterally.  Imaging: Dg Chest 1 View  Result Date: 09/13/2018 CLINICAL DATA:  Elevated white blood cell count today. EXAM: CHEST  1 VIEW COMPARISON:  Single-view of  the chest 09/11/2018. PA and lateral chest 10/25/2015. CT chest 10/25/2015. FINDINGS: The lungs are clear. Heart size is upper normal. No pneumothorax or pleural effusion. Aortic atherosclerosis is noted. No acute or focal bony abnormality. IMPRESSION: No acute disease. Atherosclerosis. Electronically Signed   By: Inge Rise M.D.   On: 09/13/2018 15:11   Ct Abdomen Pelvis W Contrast  Result Date: 09/11/2018 CLINICAL DATA:  Abdominal distention. EXAM: CT ABDOMEN AND PELVIS WITH CONTRAST TECHNIQUE: Multidetector CT imaging of the abdomen and pelvis was performed using the standard protocol following bolus administration of intravenous contrast. CONTRAST:  133mL ISOVUE-300 IOPAMIDOL (ISOVUE-300) INJECTION 61% COMPARISON:  CT scan dated 06/11/2015 FINDINGS: Lower chest: No acute abnormality. Aortic and coronary atherosclerosis. Slight scarring in the right middle lobe, stable. Hepatobiliary: Hepatic steatosis. Extensive cholelithiasis. Slight new thickening and edema of the gallbladder wall. No dilated bile ducts. Pancreas: Unremarkable. No pancreatic ductal dilatation or surrounding inflammatory changes. Spleen: Chronic calcified granulomas. Otherwise normal. Adrenals/Urinary Tract: Adrenal glands are normal. Multiple stable bilateral renal cysts. No hydronephrosis. Bladder is normal. Stomach/Bowel: There  are multiple diverticula scattered throughout the colon. There are also numerous diverticula in the terminal ileum. Small bowel is otherwise normal. Stomach and appendix are normal. Vascular/Lymphatic: Aortic atherosclerosis. No enlarged abdominal or pelvic lymph nodes. Reproductive: Prostate is unremarkable. Other: Small right inguinal hernia containing only fat. Two tiny periumbilical hernias containing only fat. These are unchanged. No ascites. Musculoskeletal: No acute abnormality. Multilevel chronic degenerative disc disease in the lower lumbar spine. IMPRESSION: 1. Extensive cholelithiasis with new slight thickening and edema of the gallbladder wall. 2. Extensive diverticulosis of the colon and of the terminal ileum. Aortic Atherosclerosis (ICD10-I70.0). Electronically Signed   By: Lorriane Shire M.D.   On: 09/11/2018 10:47   Dg Abdomen Acute W/chest  Result Date: 09/11/2018 CLINICAL DATA:  Abdominal pain and distension beginning today. EXAM: DG ABDOMEN ACUTE W/ 1V CHEST COMPARISON:  10/25/2015 FINDINGS: One-view chest is unremarkable without evidence of infiltrate, collapse or effusion. No free air seen under the diaphragm. Abdominal radiographs show a moderate amount of fecal material throughout the colon. No sign of small bowel pathology. No acute calcifications or bone findings. There is chronic arterial calcification and chronic lumbar degenerative change. IMPRESSION: Negative acute abdominal series. Moderate amount of fecal material throughout the colon. Electronically Signed   By: Nelson Chimes M.D.   On: 09/11/2018 07:10   US Abdomen Limited Ruq  Result Date: 09/13/2018 CLINICAL DATA:  Right upper quadrant pain EXAM: ULTRASOUND ABDOMEN LIMITED RIGHT UPPER QUADRANT COMPARISON:  CT 09/11/2018 FINDINGS: Gallbladder: Multiple gallstones measuring up to 1.9 cm. Gallbladder wall thickening 5.5 mm. Positive sonographic Murphy sign. Mild pericholecystic fluid. Common bile duct: Diameter: Not adequately  visualized for measurement. No biliary dilatation on recent CT Liver: Liver is extremely echogenic and difficult to evaluate by ultrasound. Fatty liver on CT. No focal lesion in the liver. Portal vein is patent on color Doppler imaging with normal direction of blood flow towards the liver. IMPRESSION: Cholelithiasis. Findings suggestive of acute cholecystitis with gallbladder wall thickening, positive sonographic Murphy sign, and pericholecystic fluid. Very echogenic liver compatible with fatty infiltration. Electronically Signed   By: Franchot Gallo M.D.   On: 09/13/2018 16:01    Labs:  CBC: Recent Labs    09/11/18 0622 09/13/18 1438 09/13/18 1445 09/14/18 0626 09/15/18 0718  WBC 11.3* 21.2*  --  18.3* 18.2*  HGB 13.7 12.7* 12.9* 11.4* 10.5*  HCT 39.4 35.9* 38.0* 31.8* 29.4*  PLT 180 121*  --  97* 120*    COAGS: Recent Labs    09/15/18 1109  INR 1.29    BMP: Recent Labs    09/11/18 0622 09/13/18 1438 09/13/18 1445 09/14/18 0626 09/15/18 0718  NA 135 133* 132* 131* 134*  K 3.7 4.2 4.2 3.7 3.6  CL 102 99 99 100 104  CO2 21* 22  --  20* 20*  GLUCOSE 230* 194* 196* 186* 169*  BUN 15 33* 40* 37* 29*  CALCIUM 9.6 8.7*  --  8.0* 8.3*  CREATININE 1.01 2.69* 2.70* 2.04* 1.57*  GFRNONAA >60 23*  --  32* 44*  GFRAA >60 26*  --  37* 51*    LIVER FUNCTION TESTS: Recent Labs    09/11/18 0622 09/13/18 1438 09/14/18 0626  BILITOT 0.7 1.4* 1.5*  AST 29 31 19   ALT 35 50* 37  ALKPHOS 38 46 45  PROT 8.1 7.7 7.1  ALBUMIN 4.6 3.8 3.7    TUMOR MARKERS: No results for input(s): AFPTM, CEA, CA199, CHROMGRNA in the last 8760 hours.  Assessment and Plan:  73 y.o. male with multiple medical problems including BPH, CHF,  COPD, diabetes, DVT/PE, hypertension, hyperlipidemia, sleep apnea, coronary artery disease with prior stenting,  paroxysmal atrial fibrillation (on xarelto) who was admitted to [facility] on 09/13/18 with several day history of epigastric/right upper  quadrant abdominal pain.  Subsequent imaging has revealed extensive cholelithiasis with slight thickening and edema of the gallbladder wall, pericholecystic fluid, positive sonographic Murphy's sign consistent with acute cholecystitis.  Extensive diverticulosis of the colon and terminal ileum also present. Current labs include WBC 18.2, hemoglobin 10.5, platelets 120k, creatinine 1.57, PT 15.9, INR 1.29, total bilirubin 1.5.  He is afebrile on IV cefepime.  Request now received from surgery for percutaneous cholecystostomy.  Imaging studies have been reviewed by Dr. Annamaria Boots.  Details/risks of procedure, including but not limited to, internal bleeding, infection, injury to adjacent structures, prolonged need for drain placement discussed with patient and spouse with their understanding and consent.  Procedure scheduled for this afternoon.   Thank you for this interesting consult.  I greatly enjoyed meeting Matthew Ochoa. and look forward to participating in their care.  A copy of this report was sent to the requesting provider on this date.  Electronically Signed: D. Rowe Robert, PA-C 09/15/2018, 12:44 PM   I spent a total of 40 minutes    in face to face in clinical consultation, greater than 50% of which was counseling/coordinating care for gallbladder drain placement

## 2018-09-15 NOTE — Progress Notes (Addendum)
Patient ID: Matthew Ochoa., male   DOB: [birth year], 73 y.o.   MRN: [id number]       Subjective: No new complaints today, but doesn't really feel any better today than yesterday.  Pain is the same.    Objective: Vital signs in last 24 hours: Temp:  [98.3 F (36.8 C)-101 F (38.3 C)] 98.3 F (36.8 C) (01/10 0918) Pulse Rate:  [46-85] 75 (01/10 0918) Resp:  [16-24] 20 (01/10 0918) BP: (111-137)/(64-83) 120/70 (01/10 0918) SpO2:  [92 %-95 %] 95 % (01/10 0918) Weight:  [129.7 kg] 129.7 kg (01/10 0500) Last BM Date: 09/15/18  Intake/Output from previous day: 01/09 0701 - 01/10 0700 In: 640 [P.O.:240; IV Piggyback:400] Out: 650 [Urine:650] Intake/Output this shift: No intake/output data recorded.  PE: Heart: irregular Lungs: CTAB Abd: obese, tight likely at baseline, hypoactive BS, without pain meds currently and tender across upper abdomen, focally in RUQ.  Lab Results:  Recent Labs    09/14/18 0626 09/15/18 0718  WBC 18.3* 18.2*  HGB 11.4* 10.5*  HCT 31.8* 29.4*  PLT 97* 120*   BMET Recent Labs    09/14/18 0626 09/15/18 0718  NA 131* 134*  K 3.7 3.6  CL 100 104  CO2 20* 20*  GLUCOSE 186* 169*  BUN 37* 29*  CREATININE 2.04* 1.57*  CALCIUM 8.0* 8.3*   PT/INR No results for input(s): LABPROT, INR in the last 72 hours. CMP     Component Value Date/Time   NA 134 (L) 09/15/2018 0718   K 3.6 09/15/2018 0718   CL 104 09/15/2018 0718   CO2 20 (L) 09/15/2018 0718   GLUCOSE 169 (H) 09/15/2018 0718   BUN 29 (H) 09/15/2018 0718   CREATININE 1.57 (H) 09/15/2018 0718   CREATININE 1.08 03/19/2015 1128   CALCIUM 8.3 (L) 09/15/2018 0718   PROT 7.1 09/14/2018 0626   ALBUMIN 3.7 09/14/2018 0626   AST 19 09/14/2018 0626   ALT 37 09/14/2018 0626   ALKPHOS 45 09/14/2018 0626   BILITOT 1.5 (H) 09/14/2018 0626   GFRNONAA 44 (L) 09/15/2018 0718   GFRNONAA 70 03/19/2015 1128   GFRAA 51 (L) 09/15/2018 0718   GFRAA 81 03/19/2015 1128   Lipase     Component Value  Date/Time   LIPASE 32 09/13/2018 1438       Studies/Results: Dg Chest 1 View  Result Date: 09/13/2018 CLINICAL DATA:  Elevated white blood cell count today. EXAM: CHEST  1 VIEW COMPARISON:  Single-view of the chest 09/11/2018. PA and lateral chest 10/25/2015. CT chest 10/25/2015. FINDINGS: The lungs are clear. Heart size is upper normal. No pneumothorax or pleural effusion. Aortic atherosclerosis is noted. No acute or focal bony abnormality. IMPRESSION: No acute disease. Atherosclerosis. Electronically Signed   By: Inge Rise M.D.   On: 09/13/2018 15:11   US Abdomen Limited Ruq  Result Date: 09/13/2018 CLINICAL DATA:  Right upper quadrant pain EXAM: ULTRASOUND ABDOMEN LIMITED RIGHT UPPER QUADRANT COMPARISON:  CT 09/11/2018 FINDINGS: Gallbladder: Multiple gallstones measuring up to 1.9 cm. Gallbladder wall thickening 5.5 mm. Positive sonographic Murphy sign. Mild pericholecystic fluid. Common bile duct: Diameter: Not adequately visualized for measurement. No biliary dilatation on recent CT Liver: Liver is extremely echogenic and difficult to evaluate by ultrasound. Fatty liver on CT. No focal lesion in the liver. Portal vein is patent on color Doppler imaging with normal direction of blood flow towards the liver. IMPRESSION: Cholelithiasis. Findings suggestive of acute cholecystitis with gallbladder wall thickening, positive sonographic Murphy sign, and pericholecystic fluid. Very  echogenic liver compatible with fatty infiltration. Electronically Signed   By: Franchot Gallo M.D.   On: 09/13/2018 16:01    Anti-infectives: Anti-infectives (From admission, onward)   Start     Dose/Rate Route Frequency Ordered Stop   09/14/18 2000  metroNIDAZOLE (FLAGYL) IVPB 500 mg     500 mg 100 mL/hr over 60 Minutes Intravenous Every 8 hours 09/14/18 1233     09/13/18 2200  ceFEPIme (MAXIPIME) 1 g in sodium chloride 0.9 % 100 mL IVPB     1 g 200 mL/hr over 30 Minutes Intravenous Every 12 hours 09/13/18  1806     09/13/18 1500  metroNIDAZOLE (FLAGYL) IVPB 500 mg  Status:  Discontinued     500 mg 100 mL/hr over 60 Minutes Intravenous Every 8 hours 09/13/18 1452 09/14/18 1233   09/13/18 1500  ceFEPIme (MAXIPIME) 2 g in sodium chloride 0.9 % 100 mL IVPB     2 g 200 mL/hr over 30 Minutes Intravenous  Once 09/13/18 1455 09/13/18 1541       Assessment/Plan  OSA Diabetes mellitus Hypertension Coronary artery disease, status post stenting x1 Atrial fibrillation, on Xarelto History of DVT, PE Acute kidney failure, likely secondary to dehydration and infection COPD  Acute cholecystitis -appreciate cardiology evaluation of this patient.  He was cleared for surgery. -WBC stable at 18K. plts have improved and kidney function improving.   -lap chole with possible IOC discussed in detail with the patient and his wife.  Patient is appropriately quite concerned about surgical intervention due to possible risks and complications.  He is at slightly higher risk due to previous anticoagulation, other medical problems, and duration of symptoms.  We discussed the other possibility is to allow his infection to cool off with placement of a perc chole drain for 6-8 weeks and then plan an interval cholecystectomy.  He would still have risks with this procedure as well, but his acute infection would be improved and therefore slightly less risk.  He and his wife were told to think about it and that Dr. Harlow Asa would be by to discuss this situation with them as well as make a more definitive plan of care. -cont NPO until we can determine course of action. -cont abx therapy.  He would probably benefit from a stronger abx such as zosyn given his presentation of SIRS.  FEN - NPO VTE - Heparin, xarelto on hold ID - cefepime/flagyl  ADDENDUM: after D/W Dr. Harlow Asa and patient we would like to proceed with perc chole drain.  He has had prolonged symptoms and anticipate he is at high risk for surgical complications if we  proceed at this time.  Patient and wife are agreeable with drain placement.  IR has been consulted.  LOS: 2 days    Henreitta Cea , [facility] 09/15/2018, 9:45 AM Pager: [phone]

## 2018-09-15 NOTE — Progress Notes (Signed)
PHARMACY NOTE -  Xarelto, [place] has been assisting with dosing of Zosyn for intra-abdominal infection. Xarelto for Afib, PE, DVT also resumed after cholecystostomy placement earlier this afternoon  Today, 09/15/2018:  Hgb lower, most likely dilutional; still > 10g  Plt low but improved from yesterday  On SQ heparin, last dose yesterday  D3 abx, previously on cefepime/Flagyl  Cultures remain clear to date  WBC still elevated but improved  Still having fevers, most recently to 100.6 on Cefepime/Flagyl  Plan:  Zosyn 3.375 g IV q8 hr starting tonight (1x dose given at 1500)  Resume Xarelto 20 mg PO daily with supper, starting at 10p tonight. Typically give with food, but IR did not want Xarelto started any earlier than HS given recent procedure  Discontinue SQ heparin   Pharmacy will sign off, following peripherally for renal adjustments and culture results. Please reconsult if a change in clinical status warrants re-evaluation of dosage.  Reuel Boom, PharmD, BCPS [phone] 09/15/2018, 4:37 PM

## 2018-09-15 NOTE — Progress Notes (Signed)
PROGRESS NOTE  Matthew Ochoa. DPO:[id number] DOB: 07/09/1946 DOA: 09/13/2018 PCP: Seward Carol, MD  HPI/Recap of past 24 hours: Matthew Ochoa. is a 73 y.o. male with medical history significant for hypertension, type 2 diabetes, paroxysmal A. fib on Xarelto, diastolic CHF, who presented to [facility] ED with complaints of persistent abdominal pain for 1 week duration.  Went to his primary care provider today where he was found to be febrile and was advised to report to the ED for further evaluation.  Associated with poor oral intake and dyspnea with minimal exertion.  Denies any chills or night sweats at home.  Denies nausea, vomiting or diarrhea.  Upon presentation to the ED, patient is febrile with T-max of 102.1.  Lab studies remarkable for leukocytosis with WBC of 21,000, lactic acid 2.41, elevated ALT and total bilirubin.  Right upper quadrant ultrasound revealed findings consistent with acute cholecystitis.  ED physician consulted general surgery for possible surgical intervention.  TRH asked to admit for medical management.  09/14/18: Seen and examined with his wife at bedside.  States he has no pain on his right upper quadrant abdomen unless palpated.  No new complaints.  09/15/2018: Patient seen and examined his wife at bedside.  No acute events overnight.  Denies nausea or abdominal pain when not palpitated.  No new complaints.  Plan for [facility] Chole today by interventional radiology.  Resume anticoagulation when okay with interventional radiology.  No plan for surgical intervention at this time, per general surgery.   Assessment/Plan: Active Problems:   Acute cholecystitis  Sepsis secondary to acute cholecystitis Presented with WBC 21,000, T-max of 102.1, lactic acid 2.41 Continue IV cefepime and IV Flagyl  Cultures x2 peripherally no growth to date General surgery consulted following Interventional radiology consulted for [facility] Chole placement Resume oral anticoagulation tonight per  interventional radiology  Improving AKI suspect prerenal secondary to dehydration versus sepsis with hypotension Baseline creatinine 1.0 with GFR greater than 60 Presented with creatinine of 2.69 with GFR of 26 Creatinine continues to improve down to 1.57 from 2.04 Continue to avoid nephrotoxic agents/dehydration/hypotension Continue to monitor urine output Repeat BMP in the morning  Improving hypovolemic hyponatremia Presented with sodium of 133 Improving to 431  Chronic diastolic CHF Continue strict I's and O's and daily weight Hold antihypertensives Appears hypovolemic  Type 2 diabetes Continue to hold metformin and glimepiride Continue insulin sliding scale Hemoglobin A1c 7.7 Avoid hypoglycemia Heart healthy diabetic diet  Hypertension Continue to hold antihypertensive Blood pressures currently soft Gentle IV fluid hydration  Hyperlipidemia Hold Lipitor for now LDL 24  Paroxysmal A. fib/history of DVT and PE on Xarelto Restart Xarelto tonight 09/15/2018  Risks: High risk for decompensation due to sepsis secondary to acute cholecystitis, may require surgery, multiple comorbidities and advanced age.  Patient will require at least 2 midnights for further evaluation and treatment of present condition.   DVT prophylaxis: On Xarelto  Code Status: Full code  Family Communication: Wife at bedside  Disposition Plan: Admit to telemetry unit  Consults called: General surgery by ED physician  Admission status: Inpatient status     Objective: Vitals:   09/15/18 0918 09/15/18 1353 09/15/18 1519 09/15/18 1525  BP: 120/70 116/74 118/75 117/73  Pulse: 75 74 70 70  Resp: 20 18 (!) 21 (!) 23  Temp: 98.3 F (36.8 C) (!) 100.6 F (38.1 C)    TempSrc: Oral Oral    SpO2: 95% 92% 95% 93%  Weight:      Height:  Intake/Output Summary (Last 24 hours) at 09/15/2018 1531 Last data filed at 09/15/2018 1400 Gross per 24 hour  Intake 740 ml  Output  650 ml  Net 90 ml   Filed Weights   09/13/18 1432 09/15/18 0500  Weight: 129.3 kg 129.7 kg    Exam:  . General: 73 y.o. year-old male morbidly obese in no acute distress.  Alert and oriented x3.   . Cardiovascular: Regular rate and rhythm with no rubs or gallops.  No JVD or thyromegaly noted . Respiratory: Clear to auscultation with no wheezes or rales.  Good inspiratory effort. . Abdomen: Soft nontender nondistended with normal bowel sounds x4 quadrants. . Musculoskeletal: Trace lower extremity edema. 2/4 pulses in all 4 extremities. Marland Kitchen Psychiatry: Mood is appropriate for condition and setting   Data Reviewed: CBC: Recent Labs  Lab 09/11/18 0622 09/13/18 1438 09/13/18 1445 09/14/18 0626 09/15/18 0718  WBC 11.3* 21.2*  --  18.3* 18.2*  NEUTROABS  --  17.9*  --   --   --   HGB 13.7 12.7* 12.9* 11.4* 10.5*  HCT 39.4 35.9* 38.0* 31.8* 29.4*  MCV 78.5* 78.2*  --  76.8* 77.0*  PLT 180 121*  --  97* 096*   Basic Metabolic Panel: Recent Labs  Lab 09/11/18 0622 09/13/18 1438 09/13/18 1445 09/14/18 0626 09/15/18 0718  NA 135 133* 132* 131* 134*  K 3.7 4.2 4.2 3.7 3.6  CL 102 99 99 100 104  CO2 21* 22  --  20* 20*  GLUCOSE 230* 194* 196* 186* 169*  BUN 15 33* 40* 37* 29*  CREATININE 1.01 2.69* 2.70* 2.04* 1.57*  CALCIUM 9.6 8.7*  --  8.0* 8.3*   GFR: Estimated Creatinine Clearance: 58.8 mL/min (A) (by C-G formula based on SCr of 1.57 mg/dL (H)). Liver Function Tests: Recent Labs  Lab 09/11/18 0622 09/13/18 1438 09/14/18 0626  AST 29 31 19   ALT 35 50* 37  ALKPHOS 38 46 45  BILITOT 0.7 1.4* 1.5*  PROT 8.1 7.7 7.1  ALBUMIN 4.6 3.8 3.7   Recent Labs  Lab 09/11/18 0622 09/13/18 1438  LIPASE 47 32   No results for input(s): AMMONIA in the last 168 hours. Coagulation Profile: Recent Labs  Lab 09/15/18 1109  INR 1.29   Cardiac Enzymes: Recent Labs  Lab 09/11/18 0622  TROPONINI <0.03   BNP (last 3 results) No results for input(s): PROBNP in the last  8760 hours. HbA1C: Recent Labs    09/13/18 1827  HGBA1C 7.7*   CBG: Recent Labs  Lab 09/14/18 1129 09/14/18 1715 09/14/18 2139 09/15/18 0730 09/15/18 1115  GLUCAP 151* 156* 148* 157* 143*   Lipid Profile: Recent Labs    09/13/18 1827  CHOL 66  HDL 10*  LDLCALC 24  TRIG 162*  CHOLHDL 6.6   Thyroid Function Tests: No results for input(s): TSH, T4TOTAL, FREET4, T3FREE, THYROIDAB in the last 72 hours. Anemia Panel: No results for input(s): VITAMINB12, FOLATE, FERRITIN, TIBC, IRON, RETICCTPCT in the last 72 hours. Urine analysis:    Component Value Date/Time   COLORURINE AMBER (A) 09/13/2018 1410   APPEARANCEUR CLOUDY (A) 09/13/2018 1410   LABSPEC 1.017 09/13/2018 1410   PHURINE 5.0 09/13/2018 1410   GLUCOSEU NEGATIVE 09/13/2018 1410   HGBUR MODERATE (A) 09/13/2018 1410   BILIRUBINUR NEGATIVE 09/13/2018 1410   KETONESUR NEGATIVE 09/13/2018 1410   PROTEINUR 100 (A) 09/13/2018 1410   UROBILINOGEN 1.0 05/13/2015 2204   NITRITE NEGATIVE 09/13/2018 1410   LEUKOCYTESUR LARGE (A) 09/13/2018 1410  Sepsis Labs: @LABRCNTIP (procalcitonin:4,lacticidven:4)  ) Recent Results (from the past 240 hour(s))  Blood Culture (routine x 2)     Status: None (Preliminary result)   Collection Time: 09/13/18  2:30 PM  Result Value Ref Range Status   Specimen Description   Final    BLOOD LEFT ANTECUBITAL Performed at [facility] [address] [place], [place] [address]    Special Requests   Final    BOTTLES DRAWN AEROBIC AND ANAEROBIC Blood Culture adequate volume Performed at [facility] [address], [place], [place] [address]    Culture   Final    NO GROWTH 2 DAYS Performed at [facility] [address], [place], [place] [address]    Report Status PENDING  Incomplete  Blood Culture (routine x 2)     Status: None (Preliminary result)   Collection Time: 09/13/18  2:40 PM  Result Value Ref Range Status   Specimen Description    Final    BLOOD RIGHT HAND Performed at [facility] [address], [place], [place] [address]    Special Requests   Final    BOTTLES DRAWN AEROBIC AND ANAEROBIC Blood Culture adequate volume Performed at [facility] [address] [place], [place] [address]    Culture   Final    NO GROWTH 2 DAYS Performed at [facility] [address] [place],  [address]    Report Status PENDING  Incomplete      Studies: No results found.  Scheduled Meds: . fentaNYL      . midazolam      . finasteride  5 mg Oral Daily  . heparin injection (subcutaneous)  5,000 Units Subcutaneous Q8H  . insulin aspart  0-5 Units Subcutaneous QHS  . insulin aspart  0-9 Units Subcutaneous TID WC  . iopamidol      . latanoprost  1 drop Both Eyes QHS  . lidocaine      . LORazepam  1 mg Intravenous Once    Continuous Infusions: . ceFEPime (MAXIPIME) IV Stopped (09/15/18 1029)  . metronidazole Stopped (09/15/18 1322)  . piperacillin-tazobactam 3.375 g (09/15/18 1510)     LOS: 2 days     Kayleen Memos, MD [facility] Pager [phone]  If 7PM-7AM, please contact night-coverage [email or web address] Password [facility] 09/15/2018, 3:31 PM

## 2018-09-15 NOTE — Care Management Important Message (Signed)
Important Message  Patient Details  Name: Matthew Ochoa. MRN: [id number] Date of Birth: 1946/02/18   Medicare Important Message Given:  Yes    Kerin Salen 09/15/2018, 12:23 [place] Message  Patient Details  Name: Matthew Ochoa. MRN: [id number] Date of Birth: 1946/04/16   Medicare Important Message Given:  Yes    Kerin Salen 09/15/2018, 12:22 PM

## 2018-09-15 NOTE — Progress Notes (Signed)
Subjective:  Patient denies any chest pain or shortness of breath.  No active cardiac issues at this point.  Scheduled for percutaneous Chole drain by IR later today.  States abdominal pain has improved WBCs still remain markedly elevated.  Renal function improving  Objective:  Vital Signs in the last 24 hours: Temp:  [98.3 F (36.8 C)-101 F (38.3 C)] 98.3 F (36.8 C) (01/10 0918) Pulse Rate:  [46-85] 75 (01/10 0918) Resp:  [16-24] 20 (01/10 0918) BP: (111-137)/(64-83) 120/70 (01/10 0918) SpO2:  [92 %-95 %] 95 % (01/10 0918) Weight:  [129.7 kg] 129.7 kg (01/10 0500)  Intake/Output from previous day: 01/09 0701 - 01/10 0700 In: 640 [P.O.:240; IV Piggyback:400] Out: 650 [Urine:650] Intake/Output from this shift: No intake/output data recorded.  Physical Exam: Neck: no adenopathy, no carotid bruit, no JVD and supple, symmetrical, trachea midline Lungs: clear to auscultation bilaterally Heart: regular rate and rhythm and S1, S2 normal Abdomen: Soft distended mild right upper quadrant tenderness no guarding Extremities: extremities normal, atraumatic, no cyanosis or edema  Lab Results: Recent Labs    09/14/18 0626 09/15/18 0718  WBC 18.3* 18.2*  HGB 11.4* 10.5*  PLT 97* 120*   Recent Labs    09/14/18 0626 09/15/18 0718  NA 131* 134*  K 3.7 3.6  CL 100 104  CO2 20* 20*  GLUCOSE 186* 169*  BUN 37* 29*  CREATININE 2.04* 1.57*   No results for input(s): TROPONINI in the last 72 hours.  Invalid input(s): CK, MB Hepatic Function Panel Recent Labs    09/14/18 0626  PROT 7.1  ALBUMIN 3.7  AST 19  ALT 37  ALKPHOS 45  BILITOT 1.5*   Recent Labs    09/13/18 1827  CHOL 66   No results for input(s): PROTIME in the last 72 hours.  Imaging: Imaging results have been reviewed and Dg Chest 1 View  Result Date: 09/13/2018 CLINICAL DATA:  Elevated white blood cell count today. EXAM: CHEST  1 VIEW COMPARISON:  Single-view of the chest 09/11/2018. PA and lateral chest  10/25/2015. CT chest 10/25/2015. FINDINGS: The lungs are clear. Heart size is upper normal. No pneumothorax or pleural effusion. Aortic atherosclerosis is noted. No acute or focal bony abnormality. IMPRESSION: No acute disease. Atherosclerosis. Electronically Signed   By: Inge Rise M.D.   On: 09/13/2018 15:11   US Abdomen Limited Ruq  Result Date: 09/13/2018 CLINICAL DATA:  Right upper quadrant pain EXAM: ULTRASOUND ABDOMEN LIMITED RIGHT UPPER QUADRANT COMPARISON:  CT 09/11/2018 FINDINGS: Gallbladder: Multiple gallstones measuring up to 1.9 cm. Gallbladder wall thickening 5.5 mm. Positive sonographic Murphy sign. Mild pericholecystic fluid. Common bile duct: Diameter: Not adequately visualized for measurement. No biliary dilatation on recent CT Liver: Liver is extremely echogenic and difficult to evaluate by ultrasound. Fatty liver on CT. No focal lesion in the liver. Portal vein is patent on color Doppler imaging with normal direction of blood flow towards the liver. IMPRESSION: Cholelithiasis. Findings suggestive of acute cholecystitis with gallbladder wall thickening, positive sonographic Murphy sign, and pericholecystic fluid. Very echogenic liver compatible with fatty infiltration. Electronically Signed   By: Franchot Gallo M.D.   On: 09/13/2018 16:01    Cardiac Studies:  Assessment/Plan:  Acute cholecystitis Coronary artery disease history of PCI to RCA in remote past stable Hypertension Hyperlipidemia Diabetes mellitus History of paroxysmal atrial fibrillation/atrial tachycardia/SVT in the past History of pulmonary embolism in the past History of DVT in the past Morbid obesity Obstructive sleep apnea on CPAP Resolving acute kidney injury  secondary to hypotension/dehydration Plan No active cardiac issues at this point Agree with percutaneous drainage by IR and switching antibiotics as per primary team I will sign off please call if needed  LOS: 2 days    Charolette Forward 09/15/2018, 11:52 AM

## 2018-09-15 NOTE — Procedures (Signed)
Acute calculus cholecystitis  S/p perc cholecystostomy  No comp Stable cx sent 100cc exudative bile aspirated Full report in pacs

## 2018-09-16 LAB — BASIC METABOLIC PANEL
Anion gap: 10 (ref 5–15)
BUN: 26 mg/dL — ABNORMAL HIGH (ref 8–23)
CO2: 23 mmol/L (ref 22–32)
Calcium: 8.3 mg/dL — ABNORMAL LOW (ref 8.9–10.3)
Chloride: 103 mmol/L (ref 98–111)
Creatinine, Ser: 1.4 mg/dL — ABNORMAL HIGH (ref 0.61–1.24)
GFR calc Af Amer: 58 mL/min — ABNORMAL LOW (ref >60)
GFR calc non Af Amer: 50 mL/min — ABNORMAL LOW (ref >60)
Glucose, Bld: 151 mg/dL — ABNORMAL HIGH (ref 70–99)
Potassium: 3.6 mmol/L (ref 3.5–5.1)
Sodium: 136 mmol/L (ref 135–145)

## 2018-09-16 LAB — FERRITIN: Ferritin: 1267 ng/mL — ABNORMAL HIGH (ref 24–336)

## 2018-09-16 LAB — URINE CULTURE: Culture: NO GROWTH

## 2018-09-16 LAB — CBC
HCT: 27 % — ABNORMAL LOW (ref 39.0–52.0)
Hemoglobin: 9.7 g/dL — ABNORMAL LOW (ref 13.0–17.0)
MCH: 27.2 pg (ref 26.0–34.0)
MCHC: 35.9 g/dL (ref 30.0–36.0)
MCV: 75.8 fL — ABNORMAL LOW (ref 80.0–100.0)
Platelets: 152 10*3/uL (ref 150–400)
RBC: 3.56 MIL/uL — ABNORMAL LOW (ref 4.22–5.81)
RDW: 16.6 % — ABNORMAL HIGH (ref 11.5–15.5)
WBC: 12.1 10*3/uL — ABNORMAL HIGH (ref 4.0–10.5)
nRBC: 0 % (ref 0.0–0.2)

## 2018-09-16 LAB — IRON AND TIBC
Iron: 21 ug/dL — ABNORMAL LOW (ref 45–182)
Saturation Ratios: 12 % — ABNORMAL LOW (ref 17.9–39.5)
TIBC: 182 ug/dL — ABNORMAL LOW (ref 250–450)
UIBC: 161 ug/dL

## 2018-09-16 LAB — GLUCOSE, CAPILLARY
Glucose-Capillary: 156 mg/dL — ABNORMAL HIGH (ref 70–99)
Glucose-Capillary: 157 mg/dL — ABNORMAL HIGH (ref 70–99)
Glucose-Capillary: 138 mg/dL — ABNORMAL HIGH (ref 70–99)
Glucose-Capillary: 193 mg/dL — ABNORMAL HIGH (ref 70–99)

## 2018-09-16 NOTE — Progress Notes (Signed)
Referring Physician(s): T. Gerkin  Supervising Physician: Jacqulynn Cadet  Patient Status:  [facility] - In-pt  Chief Complaint:  Cholecystitis S/p Perc chole by Dr.Shick 09/15/18  Subjective:  Matthew Ochoa is up walking in his room. He states he feels much better.  Allergies: Lisinopril  Medications: Prior to Admission medications   Medication Sig Start Date End Date Taking? Authorizing Provider  acetaminophen (TYLENOL) 325 MG tablet Take 325-650 mg by mouth every 6 (six) hours as needed for mild pain or headache.   Yes [provider]  amiodarone (PACERONE) 200 MG tablet Take 100 mg by mouth every Monday, Wednesday, and Friday.  02/22/15  Yes [provider]  amLODipine (NORVASC) 5 MG tablet Take 1 tablet (5 mg total) by mouth daily. 07/06/16  Yes Micheline Chapman, NP  aspirin 81 MG chewable tablet Chew 81 mg by mouth daily.   Yes [provider]  atorvastatin (LIPITOR) 40 MG tablet TAKE 1 TABLET( 40 MG TOTAL) BY MOUTH EVERY EVENING Patient taking differently: Take 40 mg by mouth daily at 6 PM. TAKE 1 TABLET( 40 MG TOTAL) BY MOUTH EVERY EVENING 07/26/16  Yes Micheline Chapman, NP  finasteride (PROSCAR) 5 MG tablet Take 5 mg by mouth daily.  10/04/14  Yes [provider]  furosemide (LASIX) 40 MG tablet Take 40 mg by mouth daily.  03/16/17  Yes [provider]  glimepiride (AMARYL) 4 MG tablet Take 4 mg by mouth daily with breakfast.  03/22/17  Yes [provider]  latanoprost (XALATAN) 0.005 % ophthalmic solution Place 1 drop into both eyes at bedtime.   Yes [provider]  losartan (COZAAR) 100 MG tablet Take 100 mg by mouth daily. 02/09/17  Yes [provider]  metFORMIN (GLUCOPHAGE) 500 MG tablet Take 1 tablet (500 mg total) by mouth 3 (three) times daily. Patient taking differently: Take 500-1,000 mg by mouth 2 (two) times daily. 1000mg  in the Morning. 500mg  in the afternoon. 03/19/15  Yes Micheline Chapman,  NP  nitroGLYCERIN (NITROSTAT) 0.4 MG SL tablet Place 0.4 mg under the tongue every 5 (five) minutes as needed. For chest pain   Yes [provider]  rivaroxaban (XARELTO) 20 MG TABS tablet Take 1 tablet (20 mg total) by mouth daily with supper. 12/18/14  Yes [name], FNP  hydrochlorothiazide (HYDRODIURIL) 25 MG tablet Take 1 tablet (25 mg total) by mouth daily. Patient not taking: Reported on 09/11/2018 04/09/16   Micheline Chapman, NP     Vital Signs: BP 114/72   Pulse 64   Temp 98.9 F (37.2 C) (Oral)   Resp 20   Ht 5' 10.5" (1.791 m)   Wt 131 kg   SpO2 93%   BMI 40.87 kg/m   Physical Exam Awake and alert Ambulating in room Perc chole drain in place No output charted but patient reports nurse "just emptied it". Only a small amount of drainage in bag = blood tinged clear fluid.  Imaging: Dg Chest 1 View  Result Date: 09/13/2018 CLINICAL DATA:  Elevated white blood cell count today. EXAM: CHEST  1 VIEW COMPARISON:  Single-view of the chest 09/11/2018. PA and lateral chest 10/25/2015. CT chest 10/25/2015. FINDINGS: The lungs are clear. Heart size is upper normal. No pneumothorax or pleural effusion. Aortic atherosclerosis is noted. No acute or focal bony abnormality. IMPRESSION: No acute disease. Atherosclerosis. Electronically Signed   By: Inge Rise M.D.   On: 09/13/2018 15:11   Ir Perc Cholecystostomy  Result  Date: 09/15/2018 INDICATION: High risk surgical candidate, acute calculus cholecystitis EXAM: Percutaneous cholecystostomy MEDICATIONS: 3.375 g Zosyn; The antibiotic was administered within an appropriate time frame prior to the initiation of the procedure. ANESTHESIA/SEDATION: Moderate (conscious) sedation was employed during this procedure. A total of Versed 2.0 mg and Fentanyl 50 mcg was administered intravenously. Moderate Sedation Time: 10 minutes. The patient's level of consciousness and vital signs were monitored continuously by radiology nursing  throughout the procedure under my direct supervision. FLUOROSCOPY TIME:  Fluoroscopy Time: 0 minutes 18 seconds (25 mGy). COMPLICATIONS: None immediate. PROCEDURE: Informed written consent was obtained from the patient after a thorough discussion of the procedural risks, benefits and alternatives. All questions were addressed. Maximal Sterile Barrier Technique was utilized including caps, mask, sterile gowns, sterile gloves, sterile drape, hand hygiene and skin antiseptic. A timeout was performed prior to the initiation of the procedure. Previous imaging reviewed. Preliminary ultrasound performed. The gallbladder was localized in the right upper quadrant beneath the subcostal margin. Overlying skin marked. Under sterile conditions and local anesthesia, an 18 gauge 15 cm access needle was advanced percutaneously from a transhepatic approach into the gallbladder. Needle position confirmed with ultrasound. There was return of exudative bile. Sample sent for culture. Guidewire inserted followed by tract dilatation to insert a pigtail drain. Drain catheter position confirmed with fluoroscopy. Images obtained for documentation. 100 cc bile removed by syringe aspiration. Catheter connected to external gravity drainage bag. Catheter secured with Prolene suture and a sterile dressing. No immediate complication. Patient tolerated the procedure well. IMPRESSION: Successful ultrasound fluoroscopic percutaneous transhepatic cholecystostomy Electronically Signed   By: Jerilynn Mages.  Shick M.D.   On: 09/15/2018 15:53   US Abdomen Limited Ruq  Result Date: 09/13/2018 CLINICAL DATA:  Right upper quadrant pain EXAM: ULTRASOUND ABDOMEN LIMITED RIGHT UPPER QUADRANT COMPARISON:  CT 09/11/2018 FINDINGS: Gallbladder: Multiple gallstones measuring up to 1.9 cm. Gallbladder wall thickening 5.5 mm. Positive sonographic Murphy sign. Mild pericholecystic fluid. Common bile duct: Diameter: Not adequately visualized for measurement. No biliary  dilatation on recent CT Liver: Liver is extremely echogenic and difficult to evaluate by ultrasound. Fatty liver on CT. No focal lesion in the liver. Portal vein is patent on color Doppler imaging with normal direction of blood flow towards the liver. IMPRESSION: Cholelithiasis. Findings suggestive of acute cholecystitis with gallbladder wall thickening, positive sonographic Murphy sign, and pericholecystic fluid. Very echogenic liver compatible with fatty infiltration. Electronically Signed   By: Franchot Gallo M.D.   On: 09/13/2018 16:01    Labs:  CBC: Recent Labs    09/13/18 1438 09/13/18 1445 09/14/18 0626 09/15/18 0718 09/16/18 0646  WBC 21.2*  --  18.3* 18.2* 12.1*  HGB 12.7* 12.9* 11.4* 10.5* 9.7*  HCT 35.9* 38.0* 31.8* 29.4* 27.0*  PLT 121*  --  97* 120* 152    COAGS: Recent Labs    09/15/18 1109  INR 1.29    BMP: Recent Labs    09/13/18 1438 09/13/18 1445 09/14/18 0626 09/15/18 0718 09/16/18 0646  NA 133* 132* 131* 134* 136  K 4.2 4.2 3.7 3.6 3.6  CL 99 99 100 104 103  CO2 22  --  20* 20* 23  GLUCOSE 194* 196* 186* 169* 151*  BUN 33* 40* 37* 29* 26*  CALCIUM 8.7*  --  8.0* 8.3* 8.3*  CREATININE 2.69* 2.70* 2.04* 1.57* 1.40*  GFRNONAA 23*  --  32* 44* 50*  GFRAA 26*  --  37* 51* 58*    LIVER FUNCTION TESTS: Recent Labs  09/11/18 0622 09/13/18 1438 09/14/18 0626  BILITOT 0.7 1.4* 1.5*  AST 29 31 19   ALT 35 50* 37  ALKPHOS 38 46 45  PROT 8.1 7.7 7.1  ALBUMIN 4.6 3.8 3.7    Assessment and Plan:  Acute calculous cholecystitis  S/P perc drain by Dr. Annamaria Boots 09/15/18  Continue routine drain care. Record output.  For probable cholecystectomy in 6 weeks per surgery.  Electronically Signed: Murrell Redden, PA-C 09/16/2018, 2:18 PM    I spent a total of 15 Minutes at the the patient's bedside AND on the patient's hospital floor or unit, greater than 50% of which was counseling/coordinating care for f/u perc chole.

## 2018-09-16 NOTE — Progress Notes (Signed)
[facility] Office:  [phone] General Surgery Progress Note   LOS: 3 days  POD -     Chief Complaint: Abdominal pain  Assessment and Plan: 1.  Cholecystitis  Per drain - 09/16/2018  WBC - 12,100 - 09/16/2018 (improving)  Zosyn  Still sore, but better.  Agree with advancing diet.  Reg food tomorrow? Home Monday?  2.  OSA 3.  Diabetes mellitus 4.  Hypertension 5.  Coronary artery disease, status post stenting x1 6.  Atrial fibrillation  on Xarelto 7.  History of DVT, PE 8.  Acute kidney failure, likely secondary to dehydration and infection  Creat - 1.4 - 09/16/2018 9.  COPD  10.  DVT prophylaxis - on Xarelto   Active Problems:   Acute cholecystitis  Subjective:  Doing better.  Wants more food.  His wife is getting chemotx for multiple myeloma in [place] (? Physician) and she has a treatment due Monday.  He is going to have to arrange her transportation.  Objective:   Vitals:   09/15/18 1959 09/16/18 0538  BP: 114/74 114/72  Pulse: 67 64  Resp: (!) 21 20  Temp: 98.6 F (37 C) 98.9 F (37.2 C)  SpO2: 95% 93%     Intake/Output from previous day:  01/10 0701 - 01/11 0700 In: 875.4 [P.O.:480; IV Piggyback:395.4] Out: -   Intake/Output this shift:  Total I/O In: 240 [P.O.:240] Out: 250 [Urine:250]   Physical Exam:   General: Obese AAA M who is alert and oriented.    HEENT: Normal. Pupils equal. .   Lungs: Clear.   Abdomen: Sore RUQ.  Drain - nothing recorded for biliary bag, but it has a lot of bile in it   Lab Results:    Recent Labs    09/15/18 0718 09/16/18 0646  WBC 18.2* 12.1*  HGB 10.5* 9.7*  HCT 29.4* 27.0*  PLT 120* 152    BMET   Recent Labs    09/15/18 0718 09/16/18 0646  NA 134* 136  K 3.6 3.6  CL 104 103  CO2 20* 23  GLUCOSE 169* 151*  BUN 29* 26*  CREATININE 1.57* 1.40*  CALCIUM 8.3* 8.3*    PT/INR   Recent Labs    09/15/18 1109  LABPROT 15.9*  INR 1.29    ABG  No results for input(s): PHART,  HCO3 in the last 72 hours.  Invalid input(s): PCO2, PO2   Studies/Results:  Ir Perc Cholecystostomy  Result Date: 09/15/2018 INDICATION: High risk surgical candidate, acute calculus cholecystitis EXAM: Percutaneous cholecystostomy MEDICATIONS: 3.375 g Zosyn; The antibiotic was administered within an appropriate time frame prior to the initiation of the procedure. ANESTHESIA/SEDATION: Moderate (conscious) sedation was employed during this procedure. A total of Versed 2.0 mg and Fentanyl 50 mcg was administered intravenously. Moderate Sedation Time: 10 minutes. The patient's level of consciousness and vital signs were monitored continuously by radiology nursing throughout the procedure under my direct supervision. FLUOROSCOPY TIME:  Fluoroscopy Time: 0 minutes 18 seconds (25 mGy). COMPLICATIONS: None immediate. PROCEDURE: Informed written consent was obtained from the patient after a thorough discussion of the procedural risks, benefits and alternatives. All questions were addressed. Maximal Sterile Barrier Technique was utilized including caps, mask, sterile gowns, sterile gloves, sterile drape, hand hygiene and skin antiseptic. A timeout was performed prior to the initiation of the procedure. Previous imaging reviewed. Preliminary ultrasound performed. The gallbladder was localized in the right upper quadrant beneath the subcostal margin. Overlying skin marked. Under sterile conditions and local  anesthesia, an 18 gauge 15 cm access needle was advanced percutaneously from a transhepatic approach into the gallbladder. Needle position confirmed with ultrasound. There was return of exudative bile. Sample sent for culture. Guidewire inserted followed by tract dilatation to insert a pigtail drain. Drain catheter position confirmed with fluoroscopy. Images obtained for documentation. 100 cc bile removed by syringe aspiration. Catheter connected to external gravity drainage bag. Catheter secured with Prolene suture  and a sterile dressing. No immediate complication. Patient tolerated the procedure well. IMPRESSION: Successful ultrasound fluoroscopic percutaneous transhepatic cholecystostomy Electronically Signed   By: Jerilynn Mages.  Shick M.D.   On: 09/15/2018 15:53     Anti-infectives:   Anti-infectives (From admission, onward)   Start     Dose/Rate Route Frequency Ordered Stop   09/15/18 2200  piperacillin-tazobactam (ZOSYN) IVPB 3.375 g     3.375 g 12.5 mL/hr over 240 Minutes Intravenous Every 8 hours 09/15/18 1636     09/15/18 1530  piperacillin-tazobactam (ZOSYN) IVPB 3.375 g     3.375 g 100 mL/hr over 30 Minutes Intravenous  Once 09/15/18 1517 09/15/18 1540   09/14/18 2000  metroNIDAZOLE (FLAGYL) IVPB 500 mg  Status:  Discontinued     500 mg 100 mL/hr over 60 Minutes Intravenous Every 8 hours 09/14/18 1233 09/15/18 1543   09/13/18 2200  ceFEPIme (MAXIPIME) 1 g in sodium chloride 0.9 % 100 mL IVPB  Status:  Discontinued     1 g 200 mL/hr over 30 Minutes Intravenous Every 12 hours 09/13/18 1806 09/15/18 1543   09/13/18 1500  metroNIDAZOLE (FLAGYL) IVPB 500 mg  Status:  Discontinued     500 mg 100 mL/hr over 60 Minutes Intravenous Every 8 hours 09/13/18 1452 09/14/18 1233   09/13/18 1500  ceFEPIme (MAXIPIME) 2 g in sodium chloride 0.9 % 100 mL IVPB     2 g 200 mL/hr over 30 Minutes Intravenous  Once 09/13/18 1455 09/13/18 1541      Alphonsa Overall, MD, FACS Pager: [facility] Office: [phone] 09/16/2018

## 2018-09-16 NOTE — Progress Notes (Addendum)
PROGRESS NOTE  Matthew Ochoa. JHE:[id number] DOB: [birth year] DOA: 09/13/2018 PCP: Seward Carol, MD  HPI/Recap of past 24 hours: Matthew Ochoa. is a 73 y.o. male with medical history significant for hypertension, type 2 diabetes, paroxysmal A. fib on Xarelto, diastolic CHF, who presented to [facility] ED with complaints of persistent abdominal pain for 1 week duration.  Went to his primary care provider today where he was found to be febrile and was advised to report to the ED for further evaluation.  Associated with poor oral intake and dyspnea with minimal exertion.  Denies any chills or night sweats at home.  Denies nausea, vomiting or diarrhea.  Upon presentation to the ED, patient is febrile with T-max of 102.1.  Lab studies remarkable for leukocytosis with WBC of 21,000, lactic acid 2.41, elevated ALT and total bilirubin.  Right upper quadrant ultrasound revealed findings consistent with acute cholecystitis.  ED physician consulted general surgery for possible surgical intervention.  TRH asked to admit for medical management.  09/14/18: Seen and examined with his wife at bedside.  States he has no pain on his right upper quadrant abdomen unless palpated.  No new complaints.  09/15/2018: Patient seen and examined his wife at bedside.  No acute events overnight.  Denies nausea or abdominal pain when not palpitated.  No new complaints.  Plan for [facility] Chole today by interventional radiology.  Resume anticoagulation when okay with interventional radiology.  No plan for surgical intervention at this time, per general surgery.  09/16/17: Right upper abdominal pain improving with worsens with movement.  No other complaints.  Denies chest pain, palpitations or dyspnea.   Assessment/Plan: Active Problems:   Acute cholecystitis  Sepsis secondary to acute cholecystitis POD #1 post Perc Chole Presented with WBC 21,000, T-max of 102.1, lactic acid 2.41 Switched IV antibiotics to Zosyn,  continue Cultures x2 peripherally no growth to date General surgery consulted following Interventional radiology consulted for [facility] Chole placement done on 09/15/2018 Resume oral anticoagulation tonight per interventional radiology  Improving AKI suspect prerenal secondary to dehydration versus sepsis with hypotension Creatinine continues to improve to 1.40 from 2.69  Baseline creatinine 1.0 with GFR greater than 60 Presented with creatinine of 2.69 with GFR of 26 Continue to avoid nephrotoxic agents/dehydration/hypotension Continue to monitor urine output Repeat BMP in the morning  Microcytic anemia Obtain iron studies Supplement iron if indicated No signs of overt bleeding  Resolving hypovolemic hyponatremia Presented with sodium of 133 Sodium today 1/11 [id number]  Chronic diastolic CHF Continue strict I's and O's and daily weight Hold antihypertensives  Type 2 diabetes Continue to hold metformin and glimepiride Continue insulin sliding scale Hemoglobin A1c 7.7 Avoid hypoglycemia Heart healthy diabetic diet  Hypertension Continue to hold antihypertensive Blood pressures stable  Hyperlipidemia Hold Lipitor for now LDL 24  Paroxysmal A. fib/history of DVT and PE on Xarelto Restart Xarelto tonight 09/15/2018  Risks: High risk for decompensation due to sepsis secondary to acute cholecystitis, may require surgery, multiple comorbidities and advanced age.  Patient will require at least 2 midnights for further evaluation and treatment of present condition.   DVT prophylaxis: On Xarelto  Code Status: Full code  Family Communication: Wife at bedside  Disposition Plan: Admit to telemetry unit  Consults called: General surgery by ED physician  Admission status: Inpatient status     Objective: Vitals:   09/15/18 1556 09/15/18 1600 09/15/18 1959 09/16/18 0538  BP: 116/74  114/74 114/72  Pulse: 70  67 64  Resp: (!) 22  20 (!) 21 20  Temp: 98.4 F  (36.9 C)  98.6 F (37 C) 98.9 F (37.2 C)  TempSrc: Oral  Oral Oral  SpO2: 93%  95% 93%  Weight:    131 kg  Height:        Intake/Output Summary (Last 24 hours) at 09/16/2018 1222 Last data filed at 09/16/2018 1154 Gross per 24 hour  Intake 1475.44 ml  Output 550 ml  Net 925.44 ml   Filed Weights   09/15/18 0500 09/16/18 0538  Weight: 129.7 kg 131 kg    Exam:  . General: 73 y.o. year-old male morbidly obese in no acute distress.  Alert and oriented x3.   . Cardiovascular: Regular rate and rhythm with no rubs or gallops.  No JVD or thyromegaly noted.   Marland Kitchen Respiratory: Clear to auscultation with no wheezes or rales.  Good inspiratory effort. . Abdomen: Obese with hypoactive bowel sounds.  Perc chole noted on right side musculoskeletal: Trace lower extremity edema. 2/4 pulses in all 4 extremities. Marland Kitchen Psychiatry: Mood is appropriate for condition and setting   Data Reviewed: CBC: Recent Labs  Lab 09/11/18 0622 09/13/18 1438 09/13/18 1445 09/14/18 0626 09/15/18 0718 09/16/18 0646  WBC 11.3* 21.2*  --  18.3* 18.2* 12.1*  NEUTROABS  --  17.9*  --   --   --   --   HGB 13.7 12.7* 12.9* 11.4* 10.5* 9.7*  HCT 39.4 35.9* 38.0* 31.8* 29.4* 27.0*  MCV 78.5* 78.2*  --  76.8* 77.0* 75.8*  PLT 180 121*  --  97* 120* 376   Basic Metabolic Panel: Recent Labs  Lab 09/11/18 0622 09/13/18 1438 09/13/18 1445 09/14/18 0626 09/15/18 0718 09/16/18 0646  NA 135 133* 132* 131* 134* 136  K 3.7 4.2 4.2 3.7 3.6 3.6  CL 102 99 99 100 104 103  CO2 21* 22  --  20* 20* 23  GLUCOSE 230* 194* 196* 186* 169* 151*  BUN 15 33* 40* 37* 29* 26*  CREATININE 1.01 2.69* 2.70* 2.04* 1.57* 1.40*  CALCIUM 9.6 8.7*  --  8.0* 8.3* 8.3*   GFR: Estimated Creatinine Clearance: 66.3 mL/min (A) (by C-G formula based on SCr of 1.4 mg/dL (H)). Liver Function Tests: Recent Labs  Lab 09/11/18 0622 09/13/18 1438 09/14/18 0626  AST 29 31 19   ALT 35 50* 37  ALKPHOS 38 46 45  BILITOT 0.7 1.4* 1.5*  PROT  8.1 7.7 7.1  ALBUMIN 4.6 3.8 3.7   Recent Labs  Lab 09/11/18 0622 09/13/18 1438  LIPASE 47 32   No results for input(s): AMMONIA in the last 168 hours. Coagulation Profile: Recent Labs  Lab 09/15/18 1109  INR 1.29   Cardiac Enzymes: Recent Labs  Lab 09/11/18 0622  TROPONINI <0.03   BNP (last 3 results) No results for input(s): PROBNP in the last 8760 hours. HbA1C: Recent Labs    09/13/18 1827  HGBA1C 7.7*   CBG: Recent Labs  Lab 09/15/18 1115 09/15/18 1720 09/15/18 2229 09/16/18 0804 09/16/18 1152  GLUCAP 143* 131* 151* 156* 157*   Lipid Profile: Recent Labs    09/13/18 1827  CHOL 66  HDL 10*  LDLCALC 24  TRIG 162*  CHOLHDL 6.6   Thyroid Function Tests: No results for input(s): TSH, T4TOTAL, FREET4, T3FREE, THYROIDAB in the last 72 hours. Anemia Panel: No results for input(s): VITAMINB12, FOLATE, FERRITIN, TIBC, IRON, RETICCTPCT in the last 72 hours. Urine analysis:    Component Value Date/Time   COLORURINE AMBER (A) 09/13/2018 1410  APPEARANCEUR CLOUDY (A) 09/13/2018 1410   LABSPEC 1.017 09/13/2018 1410   PHURINE 5.0 09/13/2018 1410   GLUCOSEU NEGATIVE 09/13/2018 1410   HGBUR MODERATE (A) 09/13/2018 1410   BILIRUBINUR NEGATIVE 09/13/2018 1410   KETONESUR NEGATIVE 09/13/2018 1410   PROTEINUR 100 (A) 09/13/2018 1410   UROBILINOGEN 1.0 05/13/2015 2204   NITRITE NEGATIVE 09/13/2018 1410   LEUKOCYTESUR LARGE (A) 09/13/2018 1410   Sepsis Labs: @LABRCNTIP (procalcitonin:4,lacticidven:4)  ) Recent Results (from the past 240 hour(s))  Blood Culture (routine x 2)     Status: None (Preliminary result)   Collection Time: 09/13/18  2:30 PM  Result Value Ref Range Status   Specimen Description   Final    BLOOD LEFT ANTECUBITAL Performed at [facility], [place] [address], [place], [place] [address]    Special Requests   Final    BOTTLES DRAWN AEROBIC AND ANAEROBIC Blood Culture adequate volume Performed at [facility] [address] [place], [place] [address]    Culture   Final    NO GROWTH 3 DAYS Performed at [facility], [place] [address], [place], [place] [address]    Report Status PENDING  Incomplete  Blood Culture (routine x 2)     Status: None (Preliminary result)   Collection Time: 09/13/18  2:40 PM  Result Value Ref Range Status   Specimen Description   Final    BLOOD RIGHT HAND Performed at [facility] [address], [place], [place] [address]    Special Requests   Final    BOTTLES DRAWN AEROBIC AND ANAEROBIC Blood Culture adequate volume Performed at [facility] [address], [place], [place] [address]    Culture   Final    NO GROWTH 3 DAYS Performed at [facility], [place] [address], [place], [place] [address]    Report Status PENDING  Incomplete  Culture, Urine     Status: None   Collection Time: 09/15/18  8:54 AM  Result Value Ref Range Status   Specimen Description URINE, CLEAN CATCH  Final   Special Requests   Final    NONE Performed at [facility] [address] [place], [place] [address]    Culture NO GROWTH  Final   Report Status 09/16/2018 FINAL  Final  Aerobic/Anaerobic Culture (surgical/deep wound)     Status: None (Preliminary result)   Collection Time: 09/15/18  3:35 PM  Result Value Ref Range Status   Specimen Description   Final    GALL BLADDER ABSCESS Performed at [facility] [address], [place], [place] [address]    Special Requests   Final    Normal Performed at [facility], [place] [address], [place], [place] [address]    Gram Stain   Final    RARE WBC PRESENT, PREDOMINANTLY PMN FEW GRAM NEGATIVE RODS    Culture ABUNDANT GRAM NEGATIVE RODS  Final   Report Status PENDING  Incomplete      Studies: Ir Perc Cholecystostomy  Result Date: 09/15/2018 INDICATION: High risk surgical candidate, acute calculus cholecystitis EXAM:  Percutaneous cholecystostomy MEDICATIONS: 3.375 g Zosyn; The antibiotic was administered within an appropriate time frame prior to the initiation of the procedure. ANESTHESIA/SEDATION: Moderate (conscious) sedation was employed during this procedure. A total of Versed 2.0 mg and Fentanyl 50 mcg was administered intravenously. Moderate Sedation Time: 10 minutes. The patient's level of consciousness and vital signs were monitored continuously by radiology nursing  throughout the procedure under my direct supervision. FLUOROSCOPY TIME:  Fluoroscopy Time: 0 minutes 18 seconds (25 mGy). COMPLICATIONS: None immediate. PROCEDURE: Informed written consent was obtained from the patient after a thorough discussion of the procedural risks, benefits and alternatives. All questions were addressed. Maximal Sterile Barrier Technique was utilized including caps, mask, sterile gowns, sterile gloves, sterile drape, hand hygiene and skin antiseptic. A timeout was performed prior to the initiation of the procedure. Previous imaging reviewed. Preliminary ultrasound performed. The gallbladder was localized in the right upper quadrant beneath the subcostal margin. Overlying skin marked. Under sterile conditions and local anesthesia, an 18 gauge 15 cm access needle was advanced percutaneously from a transhepatic approach into the gallbladder. Needle position confirmed with ultrasound. There was return of exudative bile. Sample sent for culture. Guidewire inserted followed by tract dilatation to insert a pigtail drain. Drain catheter position confirmed with fluoroscopy. Images obtained for documentation. 100 cc bile removed by syringe aspiration. Catheter connected to external gravity drainage bag. Catheter secured with Prolene suture and a sterile dressing. No immediate complication. Patient tolerated the procedure well. IMPRESSION: Successful ultrasound fluoroscopic percutaneous transhepatic cholecystostomy Electronically Signed   By: Jerilynn Mages.   Shick M.D.   On: 09/15/2018 15:53    Scheduled Meds: . finasteride  5 mg Oral Daily  . insulin aspart  0-5 Units Subcutaneous QHS  . insulin aspart  0-9 Units Subcutaneous TID WC  . latanoprost  1 drop Both Eyes QHS  . LORazepam  1 mg Intravenous Once  . rivaroxaban  20 mg Oral Q supper    Continuous Infusions: . piperacillin-tazobactam (ZOSYN)  IV 3.375 g (09/16/18 0538)     LOS: 3 days     Kayleen Memos, MD [facility] Pager [phone]  If 7PM-7AM, please contact night-coverage [email or web address] Password [facility] 09/16/2018, 12:22 PM

## 2018-09-17 LAB — BASIC METABOLIC PANEL
Anion gap: 9 (ref 5–15)
BUN: 21 mg/dL (ref 8–23)
CO2: 24 mmol/L (ref 22–32)
Calcium: 8.6 mg/dL — ABNORMAL LOW (ref 8.9–10.3)
Chloride: 104 mmol/L (ref 98–111)
Creatinine, Ser: 1.16 mg/dL (ref 0.61–1.24)
GFR calc Af Amer: >60 mL/min (ref >60)
GFR calc non Af Amer: >60 mL/min (ref >60)
Glucose, Bld: 157 mg/dL — ABNORMAL HIGH (ref 70–99)
Potassium: 3.6 mmol/L (ref 3.5–5.1)
Sodium: 137 mmol/L (ref 135–145)

## 2018-09-17 LAB — GLUCOSE, CAPILLARY
Glucose-Capillary: 157 mg/dL — ABNORMAL HIGH (ref 70–99)
Glucose-Capillary: 191 mg/dL — ABNORMAL HIGH (ref 70–99)
Glucose-Capillary: 150 mg/dL — ABNORMAL HIGH (ref 70–99)
Glucose-Capillary: 176 mg/dL — ABNORMAL HIGH (ref 70–99)

## 2018-09-17 MED ORDER — AMIODARONE HCL 100 MG PO TABS
100.0000 mg | ORAL_TABLET | ORAL | Status: DC
  Filled 2018-09-17: qty 1

## 2018-09-17 MED ORDER — POLYETHYLENE GLYCOL 3350 17 G PO PACK
17.0000 g | PACK | Freq: Every day | ORAL | Status: DC
  Administered 2018-09-17: 17 g via ORAL
  Filled 2018-09-17 (×2): qty 1

## 2018-09-17 MED ORDER — FUROSEMIDE 40 MG PO TABS
40.0000 mg | ORAL_TABLET | Freq: Every day | ORAL | Status: DC
  Administered 2018-09-17 – 2018-09-18 (×2): 40 mg via ORAL
  Filled 2018-09-17 (×3): qty 1

## 2018-09-17 MED ORDER — SENNOSIDES-DOCUSATE SODIUM 8.6-50 MG PO TABS
2.0000 | ORAL_TABLET | Freq: Two times a day (BID) | ORAL | Status: DC
  Administered 2018-09-17: 2 via ORAL
  Filled 2018-09-17 (×3): qty 2

## 2018-09-17 NOTE — Progress Notes (Addendum)
[facility] Office:  [phone] General Surgery Progress Note   LOS: 4 days  POD -     Chief Complaint: Abdominal pain  Assessment and Plan: 1.  Cholecystitis  Per drain - 09/16/2018  WBC - 12,100 - 09/16/2018 (improving)  Zosyn  Advanced to reg food.  Home Monday?  Will need follow up with Dr. Harlow Asa in 2 - 3 weeks.  To call our office for follow up.  2.  OSA 3.  Diabetes mellitus 4.  Hypertension 5.  Coronary artery disease, status post stenting x1 6.  Atrial fibrillation  on Xarelto 7.  History of DVT, PE 8.  Acute kidney failure, likely secondary to dehydration and infection  Creat - 1.4 - 09/16/2018 9.  COPD 10.  DVT prophylaxis - on Xarelto   Active Problems:   Acute cholecystitis  Subjective:  Doing better.  Tolerating diet.  His wife is getting chemotx for multiple myeloma in [place] ([place]) on Monday.  Objective:   Vitals:   09/16/18 2101 09/17/18 0547  BP: 126/75 (!) 149/78  Pulse: (!) 57 (!) 55  Resp: 16 14  Temp: 98.4 F (36.9 C) 98.2 F (36.8 C)  SpO2: 95% 92%     Intake/Output from previous day:  01/11 0701 - 01/12 0700 In: 840 [P.O.:840] Out: 2000 [Urine:1350; Drains:650]  Intake/Output this shift:  No intake/output data recorded.   Physical Exam:   General: Obese AAA M who is alert and oriented.    HEENT: Normal. Pupils equal. .   Lungs: Clear.   Abdomen: Sore RUQ.  Drain - nothing recorded for biliary bag, but it has a lot of bile in it   Lab Results:    Recent Labs    09/15/18 0718 09/16/18 0646  WBC 18.2* 12.1*  HGB 10.5* 9.7*  HCT 29.4* 27.0*  PLT 120* 152    BMET   Recent Labs    09/16/18 0646 09/17/18 0537  NA 136 137  K 3.6 3.6  CL 103 104  CO2 23 24  GLUCOSE 151* 157*  BUN 26* 21  CREATININE 1.40* 1.16  CALCIUM 8.3* 8.6*    PT/INR   Recent Labs    09/15/18 1109  LABPROT 15.9*  INR 1.29    ABG  No results for input(s): PHART, HCO3 in the last 72 hours.  Invalid input(s): PCO2,  PO2   Studies/Results:  Ir Perc Cholecystostomy  Result Date: 09/15/2018 INDICATION: High risk surgical candidate, acute calculus cholecystitis EXAM: Percutaneous cholecystostomy MEDICATIONS: 3.375 g Zosyn; The antibiotic was administered within an appropriate time frame prior to the initiation of the procedure. ANESTHESIA/SEDATION: Moderate (conscious) sedation was employed during this procedure. A total of Versed 2.0 mg and Fentanyl 50 mcg was administered intravenously. Moderate Sedation Time: 10 minutes. The patient's level of consciousness and vital signs were monitored continuously by radiology nursing throughout the procedure under my direct supervision. FLUOROSCOPY TIME:  Fluoroscopy Time: 0 minutes 18 seconds (25 mGy). COMPLICATIONS: None immediate. PROCEDURE: Informed written consent was obtained from the patient after a thorough discussion of the procedural risks, benefits and alternatives. All questions were addressed. Maximal Sterile Barrier Technique was utilized including caps, mask, sterile gowns, sterile gloves, sterile drape, hand hygiene and skin antiseptic. A timeout was performed prior to the initiation of the procedure. Previous imaging reviewed. Preliminary ultrasound performed. The gallbladder was localized in the right upper quadrant beneath the subcostal margin. Overlying skin marked. Under sterile conditions and local anesthesia, an 18 gauge 15 cm access needle  was advanced percutaneously from a transhepatic approach into the gallbladder. Needle position confirmed with ultrasound. There was return of exudative bile. Sample sent for culture. Guidewire inserted followed by tract dilatation to insert a pigtail drain. Drain catheter position confirmed with fluoroscopy. Images obtained for documentation. 100 cc bile removed by syringe aspiration. Catheter connected to external gravity drainage bag. Catheter secured with Prolene suture and a sterile dressing. No immediate complication.  Patient tolerated the procedure well. IMPRESSION: Successful ultrasound fluoroscopic percutaneous transhepatic cholecystostomy Electronically Signed   By: Jerilynn Mages.  Shick M.D.   On: 09/15/2018 15:53     Anti-infectives:   Anti-infectives (From admission, onward)   Start     Dose/Rate Route Frequency Ordered Stop   09/15/18 2200  piperacillin-tazobactam (ZOSYN) IVPB 3.375 g     3.375 g 12.5 mL/hr over 240 Minutes Intravenous Every 8 hours 09/15/18 1636     09/15/18 1530  piperacillin-tazobactam (ZOSYN) IVPB 3.375 g     3.375 g 100 mL/hr over 30 Minutes Intravenous  Once 09/15/18 1517 09/15/18 1540   09/14/18 2000  metroNIDAZOLE (FLAGYL) IVPB 500 mg  Status:  Discontinued     500 mg 100 mL/hr over 60 Minutes Intravenous Every 8 hours 09/14/18 1233 09/15/18 1543   09/13/18 2200  ceFEPIme (MAXIPIME) 1 g in sodium chloride 0.9 % 100 mL IVPB  Status:  Discontinued     1 g 200 mL/hr over 30 Minutes Intravenous Every 12 hours 09/13/18 1806 09/15/18 1543   09/13/18 1500  metroNIDAZOLE (FLAGYL) IVPB 500 mg  Status:  Discontinued     500 mg 100 mL/hr over 60 Minutes Intravenous Every 8 hours 09/13/18 1452 09/14/18 1233   09/13/18 1500  ceFEPIme (MAXIPIME) 2 g in sodium chloride 0.9 % 100 mL IVPB     2 g 200 mL/hr over 30 Minutes Intravenous  Once 09/13/18 1455 09/13/18 1541      Alphonsa Overall, MD, FACS Pager: [facility] Office: [phone] 09/17/2018

## 2018-09-17 NOTE — Discharge Instructions (Signed)
Cholecystostomy Cholecystostomy is a procedure to drain fluid from the gallbladder by using a flexible drainage tube (catheter). The gallbladder is a pear-shaped organ that lies beneath the liver on the right side of the body. The gallbladder stores bile, which is a fluid that helps the body digest fats. You may have this procedure:  If your gallbladder is infected due to gallstones (cholecystitis).  To control a gallbladder infection if you cannot have gallbladder surgery. Tell a health care provider about:  Any allergies you have.  All medicines you are taking, including vitamins, herbs, eye drops, creams, and over-the-counter medicines.  Any problems you or family members have had with anesthetic medicines.  Any blood disorders you have.  Any surgeries you have had.  Any medical conditions you have.  Whether you are pregnant or may be pregnant. What are the risks? Generally, this is a safe procedure. However, problems may occur, including:  The catheter moving out of place.  Clogging of the catheter.  Infection of the incision site.  Internal bleeding.  Leakage of bile from the gallbladder.  Infection inside the abdomen (peritonitis).  Damage to other structures or organs.  Low blood pressure and slowed heart rate.  Allergic reactions to medicines or dyes. What happens before the procedure? Most often, you will already be in the hospital receiving treatment for a gallbladder infection or other problems with the gallbladder. Staying hydrated Follow instructions from your health care provider about hydration, which may include:  Up to 2 hours before the procedure - you may continue to drink clear liquids, such as water, clear fruit juice, black coffee, and plain tea.  Eating and drinking Follow instructions from your health care provider about eating and drinking, which may include:  8 hours before the procedure - stop eating heavy meals or foods, such as meat,  fried foods, or fatty foods.  6 hours before the procedure - stop eating light meals or foods, such as toast or cereal.  6 hours before the procedure - stop drinking milk or drinks that contain milk.  2 hours before the procedure - stop drinking clear liquids. Medicines  Ask your health care provider about: ? Changing or stopping your regular medicines. This is especially important if you are taking diabetes medicines or blood thinners. ? Taking medicines such as aspirin and ibuprofen. These medicines can thin your blood. Do not take these medicines unless your health care provider tells you to take them. ? Taking over-the-counter medicines, vitamins, herbs, and supplements. General instructions  You may have an exam or testing, including: ? Imaging studies of your gallbladder. ? Blood tests.  Do not use any products that contain nicotine or tobacco before the procedure. These products include cigarettes, e-cigarettes, and chewing tobacco. If you need help quitting, ask your health care provider. Also, do not use these products after the procedure.  Plan to have someone take you home from the hospital or clinic.  If you will be going home right after the procedure, plan to have someone with you for 24 hours.  Ask your health care provider how your surgical site will be marked or identified.  Ask your health care provider what steps will be taken to help prevent infection. These may include: ? Removing hair at the surgery site. ? Washing skin with a germ-killing soap. ? Taking antibiotic medicine. What happens during the procedure?  An IV will be inserted into one of your veins.  You will be given one or more of the  following: ? A medicine to help you relax (sedative). ? A medicine to numb the area (local anesthetic).  A small incision will be made in your abdomen.  A long needle or a wide puncturing tool (trocar) will be put through the incision.  Your health care provider  will use an imaging study (ultrasound) to guide the needle or trocar into your gallbladder.  After the needle or trocar is in your gallbladder, a small amount of dye may be injected. An X-ray may be taken to make sure that the needle is in the correct place.  A catheter will be placed through the needle.  The needle or trocar will be removed.  The catheter will be secured to your skin with stitches (sutures).  The catheter will be connected to a drainage bag. Fluid will drain from the gallbladder into the bag. Some of this bile may be sent to the lab to be tested.  A bandage (dressing) will be placed over the incision site where the catheter was placed. The procedure may vary among health care providers and hospitals. What happens after the procedure?  Your blood pressure, heart rate, breathing rate, and blood oxygen level will be monitored until you leave the hospital or clinic.  Dye may be injected through your catheter to check the catheter and your gallbladder.  Your gallbladder may be flushed out (irrigated) through the catheter.  Your catheter and drainage bag may need to stay in place for several weeks or as told by your health care provider. Summary  Cholecystostomy is a procedure to drain fluid from the gallbladder using a flexible drainage tube (catheter).  Generally, this is a safe procedure. However, problems may occur, including bleeding, infection, clogging of the catheter, damage to other structures or organs, or low blood pressure and slowed heart rate.  Follow instructions before the procedure. You will be told when to stop all food and drink, whether to change or stop any medicines, and what tests need to be done.  After the procedure, you will be monitored in the hospital, and you may have a catheter and drainage bag when you go home. This information is not intended to replace advice given to you by your health care provider. Make sure you discuss any questions you  have with your health care provider. Document Released: 11/19/2008 Document Revised: 03/20/2018 Document Reviewed: 03/20/2018 Elsevier Interactive Patient Education  2019 [employer]   Cholecystitis  Cholecystitis is inflammation of the gallbladder. It is often called a gallbladder attack. The gallbladder is a pear-shaped organ that lies beneath the liver on the right side of the body. The gallbladder stores bile, which is a fluid that helps the body digest fats. If bile builds up in your gallbladder, your gallbladder becomes inflamed. This condition may occur suddenly. Cholecystitis is a serious condition and requires treatment. What are the causes? The most common cause of this condition is gallstones. Gallstones can block the tube (duct) that carries bile out of your gallbladder. This causes bile to build up. Other causes include:  Damage to the gallbladder due to a decrease in blood flow.  Infections in the bile ducts.  Scars or kinks in the bile ducts.  Tumors in the liver, pancreas, or gallbladder. What increases the risk? You are more likely to develop this condition if:  You have sickle cell disease.  You take birth control pills or use estrogen.  You have alcoholic liver disease.  You have liver cirrhosis.  You have your nutrition  delivered through a vein (parenteral nutrition).  You are critically ill.  You do not eat or drink for a long time. This is also called "fasting."  You are obese.  You lose weight too fast.  You are pregnant.  You have high levels of fat (triglycerides) in the blood.  You have pancreatitis. What are the signs or symptoms? Symptoms of this condition include:  Pain in the abdomen, especially in the upper right area of the abdomen.  Tenderness or bloating in the abdomen.  Nausea.  Vomiting.  Fever.  Chills. How is this diagnosed? This condition is diagnosed with a medical history and physical exam. You may also have  other tests, including:  Imaging tests, such as: ? An ultrasound of the gallbladder. ? A CT scan of the abdomen. ? A gallbladder nuclear scan (HIDA scan). This scan allows your health care provider to see the bile moving from your liver to your gallbladder and on to your small intestine. ? MRI.  Blood tests, such as: ? A complete blood count. The white blood cell count may be higher than normal. ? Liver function tests. Certain types of gallstones cause some results to be higher than normal. How is this treated? Treatment may include:  Surgery to remove your gallbladder (cholecystectomy).  Antibiotic medicine, usually through an IV.  Fasting for a certain amount of time.  Giving IV fluids.  Medicine to treat pain or vomiting. Follow these instructions at home:  If you had surgery, follow instructions from your health care provider about home care after the procedure. Medicines   Take over-the-counter and prescription medicines only as told by your health care provider.  If you were prescribed an antibiotic medicine, take it as told by your health care provider. Do not stop taking the antibiotic even if you start to feel better. General instructions  Follow instructions from your health care provider about what to eat or drink. When you are allowed to eat, avoid eating or drinking anything that triggers your symptoms.  Do not lift anything that is heavier than 10 lb (4.5 kg), or the limit that you are told, until your health care provider says that it is safe.  Do not use any products that contain nicotine or tobacco, such as cigarettes and e-cigarettes. If you need help quitting, ask your health care provider.  Keep all follow-up visits as told by your health care provider. This is important. Contact a health care provider if:  Your pain is not controlled with medicine.  You have a fever. Get help right away if:  Your pain moves to another part of your abdomen or to  your back.  You continue to have symptoms or you develop new symptoms even with treatment. Summary  Cholecystitis is inflammation of the gallbladder.  The most common cause of this condition is gallstones. Gallstones can block the tube (duct) that carries bile out of your gallbladder.  Common symptoms are pain in the abdomen, nausea, vomiting, fever, and chills.  This condition is treated with surgery to remove the gallbladder, medicines, fasting, and IV fluids.  Follow your health care provider's instructions for eating and drinking. Avoid eating anything that triggers your symptoms. This information is not intended to replace advice given to you by your health care provider. Make sure you discuss any questions you have with your health care provider. Document Released: 08/23/2005 Document Revised: 12/30/2017 Document Reviewed: 12/30/2017 Elsevier Interactive Patient Education  2019 [employer].   Cholecystitis  Cholecystitis is  irritation and swelling (inflammation) of the gallbladder. The gallbladder is an organ that is shaped like a pear. It is under the liver on the right side of the body. This organ stores bile. Bile helps the body break down (digest) the fats in food. This condition can occur all of a sudden. It needs to be treated. What are the causes? This condition may be caused by stones or lumps that form in the gallbladder (gallstones). Gallstones can block the tube (duct) that carries bile out of your gallbladder. Other causes are:  Damage to the gallbladder due to less blood flow.  Germs in the bile ducts.  Scars or kinks in the bile ducts.  Abnormal growths (tumors) in the liver, pancreas, or gallbladder. What increases the risk? You are more likely to develop this condition if:  You have sickle cell disease.  You take birth control pills.  You use estrogen.  You have alcoholic liver disease.  You have liver cirrhosis.  You are being fed through a  vein.  You are very ill.  You do not eat or drink for a long time. This is also called "fasting."  You are overweight (obese).  You lose weight too fast.  You are pregnant.  You have high levels of fat in the blood (triglycerides).  You have irritation and swelling of the pancreas (pancreatitis). What are the signs or symptoms? Symptoms of this condition include:  Pain in the belly (abdomen). Pain is often in the upper right area of the belly.  Tenderness or bloating in the belly.  Feeling sick to your stomach (nauseous).  Throwing up (vomiting).  Fever.  Chills. How is this diagnosed? This condition may be diagnosed with a medical history and exam. You may also have other tests, such as:  Imaging tests. This may include: ? Ultrasound. ? CT scan of the belly. ? Nuclear scan. This is also called a HIDA scan. This scan lets your doctor see the bile as it moves in your body. ? MRI.  Blood tests. These are done to check: ? Your blood count. The white blood cell count may be higher than normal. ? How well your liver works. How is this treated? This condition may be treated with:  Surgery to take out your gallbladder.  Antibiotic medicines to treat illnesses caused by germs.  Going without food for some time.  Giving fluids through an IV tube.  Medicines to treat pain or throwing up. Follow these instructions at home:  If you had surgery, follow instructions from your doctor about how to care for yourself after you go home. Medicines   Take over-the-counter and prescription medicines only as told by your doctor.  If you were prescribed an antibiotic medicine, take it as told by your doctor. Do not stop taking it even if you start to feel better. General instructions  Follow instructions from your doctor about what to eat or drink. Do not eat or drink anything that makes you sick again.  Do not lift anything that is heavier than 10 lb (4.5 kg) until your  doctor says that it is safe.  Do not use any products that contain nicotine or tobacco, such as cigarettes and e-cigarettes. If you need help quitting, ask your doctor.  Keep all follow-up visits as told by your doctor. This is important. Contact a doctor if:  You have pain and your medicine does not help.  You have a fever. Get help right away if:  Your pain moves  to: ? Another part of your belly. ? Your back.  Your symptoms do not go away.  You have new symptoms. Summary  Cholecystitis is swelling and irritation of the gallbladder.  This condition may be caused by stones or lumps that form in the gallbladder (gallstones).  Common symptoms are pain in the belly. You may feel sick to your stomach and start throwing up. You may also have a fever and chills.  This condition may be treated with surgery to take out the gallbladder. It may also be treated with medicines, fasting, and fluids through an IV tube.  Follow what you are told about eating and drinking. Do not eat things that make you sick again. This information is not intended to replace advice given to you by your health care provider. Make sure you discuss any questions you have with your health care provider. Document Released: 08/12/2011 Document Revised: 12/30/2017 Document Reviewed: 12/30/2017 Elsevier Interactive Patient Education  2019 [employer] This sheet gives you information about how to care for yourself after your procedure. Your health care provider may also give you more specific instructions. If you have problems or questions, contact your health care provider. What can I expect after the procedure? After your procedure, it is common to have soreness near the incision site of your drainage tube (catheter). Follow these instructions at home: Incision care   Follow instructions from your health care provider about how to take care of your incision site where the  catheter was inserted. Make sure you: ? Wash your hands with soap and water before and after you change your bandage (dressing). If soap and water are not available, use hand sanitizer. ? Change your dressing as told by your health care provider.  Check the incision site every day for signs of infection. Check for: ? Redness, swelling, or pain. ? Fluid or blood. ? Warmth. ? Pus or a bad smell.  Do not take baths, swim, or use a hot tub until your health care provider approves. Ask your health care provider if you may take showers. You may only be allowed to take sponge baths. General instructions  Follow instructions from your health care provider about how to care for your catheter and collection bag at home.  Your health care provider will show you: ? How to record the amount of drainage from the catheter. ? How to flush the catheter. ? How to care for the catheter incision site.  Follow instructions from your health care provider about eating or drinking restrictions.  Take over-the-counter and prescription medicines only as told by your health care provider.  Keep all follow-up visits as told by your health care provider. This is important. Contact a health care provider if:  You have redness, swelling, or pain around the catheter incision site.  You have nausea or vomiting. Get help right away if:  Your abdominal pain gets worse.  You feel dizzy or you faint while standing.  You have fluid or blood coming from the catheter incision site.  The area around the catheter incision site feels warm to the touch.  You have pus or a bad smell coming from the catheter incision site.  You have a fever.  You have shortness of breath.  You have a rapid heartbeat.  Your nausea or vomiting does not go away.  Your catheter becomes blocked.  Your catheter comes out of your abdomen. Summary  After your procedure, it is common to have  soreness near the incision site of your  drainage tube (catheter).  Wash your hands with soap and water before and after you change your bandage (dressing). Change your dressing as told by your health care provider.  Check the catheter incision site every day for signs of infection. Check for redness, swelling, pain, fluid, blood, warmth, pus, or a bad smell.  Contact your health care provider if you have nausea or vomiting, or if you have redness, swelling, or pain around your catheter incision site.  Get help right away if your abdominal pain gets worse, you feel dizzy, you have blood or fluid coming from the catheter incision site, you have a fever, or you have shortness of breath. This information is not intended to replace advice given to you by your health care provider. Make sure you discuss any questions you have with your health care provider. Document Released: 05/14/2015 Document Revised: 03/20/2018 Document Reviewed: 03/20/2018 Elsevier Interactive Patient Education  2019 [employer] Inc[employer]   Laparoscopic Cholecystectomy Laparoscopic cholecystectomy is surgery to remove the gallbladder. The gallbladder is a pear-shaped organ that lies beneath the liver on the right side of the body. The gallbladder stores bile, which is a fluid that helps the body to digest fats. Cholecystectomy is often done for inflammation of the gallbladder (cholecystitis). This condition is usually caused by a buildup of gallstones (cholelithiasis) in the gallbladder. Gallstones can block the flow of bile, which can result in inflammation and pain. In severe cases, emergency surgery may be required. This procedure is done though small incisions in your abdomen (laparoscopic surgery). A thin scope with a camera (laparoscope) is inserted through one incision. Thin surgical instruments are inserted through the other incisions. In some cases, a laparoscopic procedure may be turned into a type of surgery that is done through a larger incision (open surgery). Tell a  health care provider about:  Any allergies you have.  All medicines you are taking, including vitamins, herbs, eye drops, creams, and over-the-counter medicines.  Any problems you or family members have had with anesthetic medicines.  Any blood disorders you have.  Any surgeries you have had.  Any medical conditions you have.  Whether you are pregnant or may be pregnant. What are the risks? Generally, this is a safe procedure. However, problems may occur, including:  Infection.  Bleeding.  Allergic reactions to medicines.  Damage to other structures or organs.  A stone remaining in the common bile duct. The common bile duct carries bile from the gallbladder into the small intestine.  A bile leak from the cyst duct that is clipped when your gallbladder is removed. What happens before the procedure? Staying hydrated Follow instructions from your health care provider about hydration, which may include:  Up to 2 hours before the procedure - you may continue to drink clear liquids, such as water, clear fruit juice, black coffee, and plain tea. Eating and drinking restrictions Follow instructions from your health care provider about eating and drinking, which may include:  8 hours before the procedure - stop eating heavy meals or foods such as meat, fried foods, or fatty foods.  6 hours before the procedure - stop eating light meals or foods, such as toast or cereal.  6 hours before the procedure - stop drinking milk or drinks that contain milk.  2 hours before the procedure - stop drinking clear liquids. Medicines  Ask your health care provider about: ? Changing or stopping your regular medicines. This is especially important if you  are taking diabetes medicines or blood thinners. ? Taking medicines such as aspirin and ibuprofen. These medicines can thin your blood. Do not take these medicines before your procedure if your health care provider instructs you not to.  You  may be given antibiotic medicine to help prevent infection. General instructions  Let your health care provider know if you develop a cold or an infection before surgery.  Plan to have someone take you home from the hospital or clinic.  Ask your health care provider how your surgical site will be marked or identified. What happens during the procedure?   To reduce your risk of infection: ? Your health care team will wash or sanitize their hands. ? Your skin will be washed with soap. ? Hair may be removed from the surgical area.  An IV tube may be inserted into one of your veins.  You will be given one or more of the following: ? A medicine to help you relax (sedative). ? A medicine to make you fall asleep (general anesthetic).  A breathing tube will be placed in your mouth.  Your surgeon will make several small cuts (incisions) in your abdomen.  The laparoscope will be inserted through one of the small incisions. The camera on the laparoscope will send images to a TV screen (monitor) in the operating room. This lets your surgeon see inside your abdomen.  Air-like gas will be pumped into your abdomen. This will expand your abdomen to give the surgeon more room to perform the surgery.  Other tools that are needed for the procedure will be inserted through the other incisions. The gallbladder will be removed through one of the incisions.  Your common bile duct may be examined. If stones are found in the common bile duct, they may be removed.  After your gallbladder has been removed, the incisions will be closed with stitches (sutures), staples, or skin glue.  Your incisions may be covered with a bandage (dressing). The procedure may vary among health care providers and hospitals. What happens after the procedure?  Your blood pressure, heart rate, breathing rate, and blood oxygen level will be monitored until the medicines you were given have worn off.  You will be given  medicines as needed to control your pain.  Do not drive for 24 hours if you were given a sedative. This information is not intended to replace advice given to you by your health care provider. Make sure you discuss any questions you have with your health care provider. Document Released: 08/23/2005 Document Revised: 07/21/2017 Document Reviewed: 02/09/2016 Elsevier Interactive Patient Education  2019 [employer]   Open Cholecystectomy, Care After This sheet gives you information about how to care for yourself after your procedure. Your health care provider may also give you more specific instructions. If you have problems or questions, contact your health care provider. What can I expect after the procedure? After the procedure, it is common to have:  Pain at your incision site. You will be given medicines to control this pain.  Mild nausea or vomiting. Follow these instructions at home: Incision care   Follow instructions from your health care provider about how to take care of your incision. Make sure you: ? Wash your hands with soap and water before you change your bandage (dressing). If soap and water are not available, use hand sanitizer. ? Change your dressing as told by your health care provider. ? Leave stitches (sutures), skin glue, or adhesive strips in place. These  skin closures may need to be in place for 2 weeks or longer. If adhesive strip edges start to loosen and curl up, you may trim the loose edges. Do not remove adhesive strips completely unless your health care provider tells you to do that.  Do not take baths, swim, or use a hot tub until your health care provider approves. Ask your health care provider if you can take showers. You may only be allowed to take sponge baths for bathing.  Check your incision area every day for signs of infection. Check for: ? More redness, swelling, or pain. ? More fluid or blood. ? Warmth. ? Pus or a bad smell. Activity  Do not  drive or use heavy machinery while taking prescription pain medicine.  Do not lift anything that is heavier than 10 lb (4.5 kg) until your health care provider approves.  Do not play contact sports until your health care provider approves.  Do not drive for 24 hours if you were given a medicine to help you relax (sedative).  Rest as needed. Do not return to work or school until your health care provider approves. General instructions  Take over-the-counter and prescription medicines only as told by your health care provider.  To prevent or treat constipation while you are taking prescription pain medicine, your health care provider may recommend that you: ? Drink enough fluid to keep your urine clear or pale yellow. ? Take over-the-counter or prescription medicines. ? Eat foods that are high in fiber, such as fresh fruits and vegetables, whole grains, and beans. ? Limit foods that are high in fat and processed sugars, such as fried and sweet foods. Contact a health care provider if:  You develop a rash.  You have more redness, swelling, or pain around your incision.  You have more fluid or blood coming from your incision.  Your incision feels warm to the touch.  You have pus or a bad smell coming from your incision.  You have a fever.  Your incision breaks open. Get help right away if:  You have trouble breathing.  You have chest pain.  You have increasing pain in your shoulders.  You faint or feel dizzy when you stand.  You have severe pain in your abdomen.  You have nausea or vomiting that lasts for more than one day.  You have leg pain. This information is not intended to replace advice given to you by your health care provider. Make sure you discuss any questions you have with your health care provider. Document Released: 12/09/2003 Document Revised: 03/13/2016 Document Reviewed: 02/09/2016 Elsevier Interactive Patient Education  2019 Elsevier  Inc[employer]   Preventing Gastrointestinal Problems During Exercise  Gastrointestinal (GI) problems are problems with the stomach and intestines. It is common for athletes to experience GI problems during exercise. This is especially true for distance runners and triathletes. You can take actions to help prevent these problems. How can this condition affect me? GI problems can affect your performance and your ability to exercise. You may have symptoms such as:  Heartburn.  Nausea.  Vomiting.  Bloating.  Passing gas (flatulence).  Cramping.  Urge to go to the bathroom.  Rectal bleeding.  Diarrhea.  Abdominal pain. What actions can I take to lower my risk for problems? Eating and drinking   Limit fiber intake before exercise. To lower the chance of diarrhea during a competition, try reducing your intake of fiber a day and a half before.  Avoid: ? Solid foods  for at least 3 hours before exercise. ? Foods and drinks that contain fat and protein during endurance exercise. ? Eating a large meal before exercise. This will lower your chance of getting abdominal pain.  Drink enough fluid to keep your urine pale yellow.  Drink water before, during, and after physical activity, even if you do not feel thirsty. Drink small amounts of water frequently throughout sporting events. Drink more water if you are exercising in hot or humid weather or in high altitudes.  If you are exercising for more than an hour, consider drinking a sports drink.  Avoid alcohol before, during, and after strenuous exercise.  Keep track of what you eat by using a food journal. This may help you identify foods that cause you to have symptoms. You can then avoid those foods. General instructions  Take over-the-counter and prescription medicines only as told by your health care provider. If you were prescribed medicines to improve your symptoms, take them exactly as told.  Work with your health care provider or a  diet and nutrition specialist (dietitian) to develop an eating plan that is right for you. This plan should help prevent symptoms and ensure that you are getting the proper nutrition for your exercise needs. Contact a health care provider if you have:  Symptoms that make it difficult for you to exercise.  Symptoms every day.  Unexplained weight loss.  Diarrhea that is not helped by diet changes.  Rectal bleeding.  Severe abdominal pain.  Night sweats.  A fever. Summary  It is common for athletes to experience gastrointestinal (GI) problems during exercise. This can affect your performance and your ability to exercise.  Drink water before, during, and after physical activity, even if you do not feel thirsty.  Contact your health care provider if your symptoms do not get better or they get worse. This information is not intended to replace advice given to you by your health care provider. Make sure you discuss any questions you have with your health care provider. Document Released: 05/18/2001 Document Revised: 04/13/2018 Document Reviewed: 09/05/2017 Elsevier Interactive Patient Education  2019 [employer].

## 2018-09-17 NOTE — Progress Notes (Signed)
PROGRESS NOTE  Matthew Ochoa. WCB:[id number] DOB: [birth year] DOA: 09/13/2018 PCP: Seward Carol, MD  HPI/Recap of past 24 hours: Matthew Ochoa. is a 73 y.o. male with medical history significant for hypertension, type 2 diabetes, paroxysmal A. fib on Xarelto, diastolic CHF, who presented to [facility] ED with complaints of persistent abdominal pain for 1 week duration.  Went to his primary care provider today where he was found to be febrile and was advised to report to the ED for further evaluation.  Associated with poor oral intake and dyspnea with minimal exertion.  Denies any chills or night sweats at home.  Denies nausea, vomiting or diarrhea.  Upon presentation to the ED, patient is febrile with T-max of 102.1.  Lab studies remarkable for leukocytosis with WBC of 21,000, lactic acid 2.41, elevated ALT and total bilirubin.  Right upper quadrant ultrasound revealed findings consistent with acute cholecystitis.  ED physician consulted general surgery for possible surgical intervention.  [facility] asked to admit for medical management.  09/15/2018:  PERC Chole today by interventional radiology.  Resume anticoagulation tonight. 09/16/17: Right upper abdominal pain improving with worsens with movement.  No other complaints.   09/17/18: Patient seen and examined at bedside.  No acute events overnight.  He has no new complaints.  No pain when he does not move.  Started regular diet which he tolerated well.  Continue routine drain care.  Continue to record output from drain.   Assessment/Plan: Active Problems:   Acute cholecystitis  Sepsis secondary to acute cholecystitis POD #2 post Perc Chole Presented with WBC 21,000, T-max of 102.1, lactic acid 2.41 Switched IV antibiotics to Zosyn, continue Cultures x2 peripherally no growth to date General surgery consulted following Interventional radiology consulted for [facility] Chole placement done on 09/15/2018 Sepsis physiology is resolving WBC continues to  trend down Afebrile  Restart regular diet  Resolving AKI suspect prerenal secondary to dehydration versus sepsis with hypotension Creatinine continues to improve to 1.1 from 1.40 from 2.69  Baseline creatinine 1.0 with GFR greater than 60 Presented with creatinine of 2.69 with GFR of 26 Continue to avoid nephrotoxic agents/dehydration/hypotension Continue to monitor urine output Repeat BMP in the morning  Microcytic anemia Obtain iron studies Supplement iron if indicated No signs of overt bleeding  Resolved hypovolemic hyponatremia Presented with sodium of 133 Sodium 132 To 137 Continue to hold off HCTZ  Chronic diastolic CHF Continue strict I's and O's and daily weight Resume Lasix 40 mg daily Continue to hold off losartan due to recent AKI and amlodipine due to recent  Type 2 diabetes with hyperglycemia Continue to hold metformin and glimepiride Continue insulin sliding scale Hemoglobin A1c 7.7 Avoid hypoglycemia Heart healthy diabetic diet  Hypertension Continue to hold antihypertensive Blood pressures stable Continue to hold off amlodipine and losartan  Hyperlipidemia Hold Lipitor for now LDL 24  Paroxysmal A. fib/history of DVT and PE on Xarelto Restart Xarelto tonight 09/15/2018 Resume spironolactone  Chronic constipation Start Senokot 2 tablets twice daily and MiraLAX 17 g daily  OSA CPAP at night  Coronary artery disease status post PCI with stenting x1 Continue home meds    DVT prophylaxis: On Xarelto  Code Status: Full code  Family Communication: Wife at bedside  Disposition Plan:  Possible discharge tomorrow 09/18/2018  Consults called: General surgery by ED physician  Admission status: Inpatient status     Objective: Vitals:   09/16/18 2101 09/17/18 0547 09/17/18 1333 09/17/18 1349  BP: 126/75 (!) 149/78  131/76  Pulse: Marland Kitchen)  57 (!) 55  (!) 57  Resp: 16 14    Temp: 98.4 F (36.9 C) 98.2 F (36.8 C)  98.6 F (37  C)  TempSrc: Oral Oral  Oral  SpO2: 95% 92% 98% 94%  Weight:  133.2 kg    Height:        Intake/Output Summary (Last 24 hours) at 09/17/2018 1453 Last data filed at 09/17/2018 1100 Gross per 24 hour  Intake 240 ml  Output 1300 ml  Net -1060 ml   Filed Weights   09/15/18 0500 09/16/18 0538 09/17/18 0547  Weight: 129.7 kg 131 kg 133.2 kg    Exam:  . General: 73 y.o. year-old male morbidly obese in no acute distress.  Alert and oriented x3.   . Cardiovascular: Regular rate and rhythm with no rubs or gallops.  No JVD or thyromegaly noted. Marland Kitchen Respiratory: Clear to Auscultation with No Wheezes or Rales.  Good Inspiratory Effort. . Abdomen: Obese with hypoactive bowel sounds.  Perc chole noted on right side musculoskeletal: Trace lower extremity edema. 2/4 pulses in all 4 extremities. Marland Kitchen Psychiatry: Mood is appropriate for condition and setting   Data Reviewed: CBC: Recent Labs  Lab 09/11/18 0622 09/13/18 1438 09/13/18 1445 09/14/18 0626 09/15/18 0718 09/16/18 0646  WBC 11.3* 21.2*  --  18.3* 18.2* 12.1*  NEUTROABS  --  17.9*  --   --   --   --   HGB 13.7 12.7* 12.9* 11.4* 10.5* 9.7*  HCT 39.4 35.9* 38.0* 31.8* 29.4* 27.0*  MCV 78.5* 78.2*  --  76.8* 77.0* 75.8*  PLT 180 121*  --  97* 120* 062   Basic Metabolic Panel: Recent Labs  Lab 09/13/18 1438 09/13/18 1445 09/14/18 0626 09/15/18 0718 09/16/18 0646 09/17/18 0537  NA 133* 132* 131* 134* 136 137  K 4.2 4.2 3.7 3.6 3.6 3.6  CL 99 99 100 104 103 104  CO2 22  --  20* 20* 23 24  GLUCOSE 194* 196* 186* 169* 151* 157*  BUN 33* 40* 37* 29* 26* 21  CREATININE 2.69* 2.70* 2.04* 1.57* 1.40* 1.16  CALCIUM 8.7*  --  8.0* 8.3* 8.3* 8.6*   GFR: Estimated Creatinine Clearance: 80.8 mL/min (by C-G formula based on SCr of 1.16 mg/dL). Liver Function Tests: Recent Labs  Lab 09/11/18 0622 09/13/18 1438 09/14/18 0626  AST 29 31 19   ALT 35 50* 37  ALKPHOS 38 46 45  BILITOT 0.7 1.4* 1.5*  PROT 8.1 7.7 7.1  ALBUMIN 4.6  3.8 3.7   Recent Labs  Lab 09/11/18 0622 09/13/18 1438  LIPASE 47 32   No results for input(s): AMMONIA in the last 168 hours. Coagulation Profile: Recent Labs  Lab 09/15/18 1109  INR 1.29   Cardiac Enzymes: Recent Labs  Lab 09/11/18 0622  TROPONINI <0.03   BNP (last 3 results) No results for input(s): PROBNP in the last 8760 hours. HbA1C: No results for input(s): HGBA1C in the last 72 hours. CBG: Recent Labs  Lab 09/16/18 1152 09/16/18 1649 09/16/18 2104 09/17/18 0742 09/17/18 1141  GLUCAP 157* 138* 193* 157* 191*   Lipid Profile: No results for input(s): CHOL, HDL, LDLCALC, TRIG, CHOLHDL, LDLDIRECT in the last 72 hours. Thyroid Function Tests: No results for input(s): TSH, T4TOTAL, FREET4, T3FREE, THYROIDAB in the last 72 hours. Anemia Panel: Recent Labs    09/16/18 0646  FERRITIN 1,267*  TIBC 182*  IRON 21*   Urine analysis:    Component Value Date/Time   COLORURINE AMBER (A) 09/13/2018 1410  APPEARANCEUR CLOUDY (A) 09/13/2018 1410   LABSPEC 1.017 09/13/2018 1410   PHURINE 5.0 09/13/2018 1410   GLUCOSEU NEGATIVE 09/13/2018 1410   HGBUR MODERATE (A) 09/13/2018 1410   BILIRUBINUR NEGATIVE 09/13/2018 1410   KETONESUR NEGATIVE 09/13/2018 1410   PROTEINUR 100 (A) 09/13/2018 1410   UROBILINOGEN 1.0 05/13/2015 2204   NITRITE NEGATIVE 09/13/2018 1410   LEUKOCYTESUR LARGE (A) 09/13/2018 1410   Sepsis Labs: @LABRCNTIP (procalcitonin:4,lacticidven:4)  ) Recent Results (from the past 240 hour(s))  Blood Culture (routine x 2)     Status: None (Preliminary result)   Collection Time: 09/13/18  2:30 PM  Result Value Ref Range Status   Specimen Description   Final    BLOOD LEFT ANTECUBITAL Performed at [facility], [place] [address] [place], [place] [address]    Special Requests   Final    BOTTLES DRAWN AEROBIC AND ANAEROBIC Blood Culture adequate volume Performed at [place] [address] [place], [place]  [address]    Culture   Final    NO GROWTH 4 DAYS Performed at [facility], [place] [address], [place], [place] [address]    Report Status PENDING  Incomplete  Blood Culture (routine x 2)     Status: None (Preliminary result)   Collection Time: 09/13/18  2:40 PM  Result Value Ref Range Status   Specimen Description   Final    BLOOD RIGHT HAND Performed at [place] [address], [place], [place] [address]    Special Requests   Final    BOTTLES DRAWN AEROBIC AND ANAEROBIC Blood Culture adequate volume Performed at [place] [address] [place], [place] [address]    Culture   Final    NO GROWTH 4 DAYS Performed at [facility], [place] [address] [place], [place] [address]    Report Status PENDING  Incomplete  Culture, Urine     Status: None   Collection Time: 09/15/18  8:54 AM  Result Value Ref Range Status   Specimen Description URINE, CLEAN CATCH  Final   Special Requests   Final    NONE Performed at [facility] [address], [place], [place] [address]    Culture NO GROWTH  Final   Report Status 09/16/2018 FINAL  Final  Aerobic/Anaerobic Culture (surgical/deep wound)     Status: None (Preliminary result)   Collection Time: 09/15/18  3:35 PM  Result Value Ref Range Status   Specimen Description   Final    GALL BLADDER ABSCESS Performed at [place] [address] [place], [place] [address]    Special Requests   Final    Normal Performed at [facility], [place] [address] [place], [place] [address]    Gram Stain   Final    RARE WBC PRESENT, PREDOMINANTLY PMN FEW GRAM NEGATIVE RODS    Culture   Final    ABUNDANT ESCHERICHIA COLI NO ANAEROBES ISOLATED; CULTURE IN PROGRESS FOR 5 DAYS    Report Status PENDING  Incomplete   Organism ID, Bacteria ESCHERICHIA COLI  Final      Susceptibility   Escherichia coli - MIC*    AMPICILLIN <=2 SENSITIVE Sensitive      CEFAZOLIN <=4 SENSITIVE Sensitive     CEFEPIME <=1 SENSITIVE Sensitive     CEFTAZIDIME <=1 SENSITIVE Sensitive     CEFTRIAXONE <=1 SENSITIVE Sensitive     CIPROFLOXACIN <=0.25 SENSITIVE Sensitive  GENTAMICIN <=1 SENSITIVE Sensitive     IMIPENEM <=0.25 SENSITIVE Sensitive     TRIMETH/SULFA <=20 SENSITIVE Sensitive     AMPICILLIN/SULBACTAM <=2 SENSITIVE Sensitive     PIP/TAZO <=4 SENSITIVE Sensitive     Extended ESBL NEGATIVE Sensitive     * ABUNDANT ESCHERICHIA COLI      Studies: No results found.  Scheduled Meds: . finasteride  5 mg Oral Daily  . furosemide  40 mg Oral Daily  . insulin aspart  0-5 Units Subcutaneous QHS  . insulin aspart  0-9 Units Subcutaneous TID WC  . latanoprost  1 drop Both Eyes QHS  . LORazepam  1 mg Intravenous Once  . polyethylene glycol  17 g Oral Daily  . rivaroxaban  20 mg Oral Q supper  . senna-docusate  2 tablet Oral BID    Continuous Infusions: . piperacillin-tazobactam (ZOSYN)  IV 3.375 g (09/17/18 0606)     LOS: 4 days     Kayleen Memos, MD [facility] Pager [phone]  If 7PM-7AM, please contact night-coverage [email or web address] Password [facility] 09/17/2018, 2:53 PM

## 2018-09-17 NOTE — Care Management Note (Signed)
Case Management Note  Patient Details  Name: Matthew Ochoa. MRN: [id number] Date of Birth: 1945-11-29  Subjective/Objective:   Acute cholecystitis                  Action/Plan: NCM spoke to pt and wife at bedside. Offered choice for HH/CMS list provided and placed on chart. Pt requested [facility] for HH. [facility]ed [facility] for [facility] RN and aide. Pt has RW at home. Declined 3n1 bedside commode. Will continue to follow for dc needs.   Expected Discharge Date: 09/18/2018             Expected Discharge Plan:  [place]  In-House Referral:  NA  Discharge planning Services  CM Consult  Post Acute Care Choice:  Home Health Choice offered to:  Patient  DME Arranged:  N/A DME Agency:  NA  HH Arranged:  RN, Nurse's Aide [place] Agency:  [place]  Status of Service:  Completed, signed off  If discussed at [place] of Stay Meetings, dates discussed:    Additional Comments:  Erenest Rasher, RN 09/17/2018, 12:09 PM

## 2018-09-18 LAB — GLUCOSE, CAPILLARY
Glucose-Capillary: 177 mg/dL — ABNORMAL HIGH (ref 70–99)
Glucose-Capillary: 219 mg/dL — ABNORMAL HIGH (ref 70–99)

## 2018-09-18 LAB — CULTURE, BLOOD (ROUTINE X 2)
Culture: NO GROWTH
Culture: NO GROWTH
Special Requests: ADEQUATE
Special Requests: ADEQUATE

## 2018-09-18 MED ORDER — CEPHALEXIN 500 MG PO CAPS
500.0000 mg | ORAL_CAPSULE | Freq: Three times a day (TID) | ORAL | Status: DC
  Administered 2018-09-18: 500 mg via ORAL
  Filled 2018-09-18: qty 1

## 2018-09-18 MED ORDER — SENNOSIDES-DOCUSATE SODIUM 8.6-50 MG PO TABS: 2.0000 | ORAL_TABLET | Freq: Two times a day (BID) | ORAL | 0 refills | Status: DC

## 2018-09-18 MED ORDER — METRONIDAZOLE 500 MG PO TABS: 500.0000 mg | ORAL_TABLET | Freq: Three times a day (TID) | ORAL | 0 refills | Status: AC

## 2018-09-18 MED ORDER — OXYCODONE HCL 5 MG PO TABS: 5.0000 mg | ORAL_TABLET | Freq: Two times a day (BID) | ORAL | 0 refills | Status: DC | PRN

## 2018-09-18 MED ORDER — CEPHALEXIN 500 MG PO CAPS: 500.0000 mg | ORAL_CAPSULE | Freq: Three times a day (TID) | ORAL | 0 refills | Status: AC

## 2018-09-18 MED ORDER — METRONIDAZOLE 500 MG PO TABS
500.0000 mg | ORAL_TABLET | Freq: Three times a day (TID) | ORAL | Status: DC
  Administered 2018-09-18: 500 mg via ORAL
  Filled 2018-09-18: qty 1

## 2018-09-18 MED ORDER — POLYETHYLENE GLYCOL 3350 17 G PO PACK: 17.0000 g | PACK | Freq: Every day | ORAL | 0 refills | Status: AC

## 2018-09-18 MED ORDER — AMLODIPINE BESYLATE 5 MG PO TABS: 5.0000 mg | ORAL_TABLET | Freq: Every day | ORAL | Status: DC

## 2018-09-18 NOTE — Progress Notes (Signed)
Referring Physician(s): Clent Demark  Supervising Physician: Daryll Brod  Patient Status:  [facility] - In-pt  Chief Complaint:  cholecystitis  Subjective: Pt doing well; awaiting d/c home   Allergies: Lisinopril  Medications: Prior to Admission medications   Medication Sig Start Date End Date Taking? Authorizing Provider  acetaminophen (TYLENOL) 325 MG tablet Take 325-650 mg by mouth every 6 (six) hours as needed for mild pain or headache.   Yes [provider]  amiodarone (PACERONE) 200 MG tablet Take 100 mg by mouth every Monday, Wednesday, and Friday.  02/22/15  Yes [provider]  amLODipine (NORVASC) 5 MG tablet Take 1 tablet (5 mg total) by mouth daily. 07/06/16  Yes Micheline Chapman, NP  aspirin 81 MG chewable tablet Chew 81 mg by mouth daily.   Yes [provider]  atorvastatin (LIPITOR) 40 MG tablet TAKE 1 TABLET( 40 MG TOTAL) BY MOUTH EVERY EVENING Patient taking differently: Take 40 mg by mouth daily at 6 PM. TAKE 1 TABLET( 40 MG TOTAL) BY MOUTH EVERY EVENING 07/26/16  Yes Micheline Chapman, NP  finasteride (PROSCAR) 5 MG tablet Take 5 mg by mouth daily.  10/04/14  Yes [provider]  furosemide (LASIX) 40 MG tablet Take 40 mg by mouth daily.  03/16/17  Yes [provider]  glimepiride (AMARYL) 4 MG tablet Take 4 mg by mouth daily with breakfast.  03/22/17  Yes [provider]  latanoprost (XALATAN) 0.005 % ophthalmic solution Place 1 drop into both eyes at bedtime.   Yes [provider]  losartan (COZAAR) 100 MG tablet Take 100 mg by mouth daily. 02/09/17  Yes [provider]  metFORMIN (GLUCOPHAGE) 500 MG tablet Take 1 tablet (500 mg total) by mouth 3 (three) times daily. Patient taking differently: Take 500-1,000 mg by mouth 2 (two) times daily. 1000mg  in the Morning. 500mg  in the afternoon. 03/19/15  Yes Micheline Chapman, NP  nitroGLYCERIN (NITROSTAT) 0.4 MG SL tablet Place 0.4 mg under the tongue  every 5 (five) minutes as needed. For chest pain   Yes [provider]  rivaroxaban (XARELTO) 20 MG TABS tablet Take 1 tablet (20 mg total) by mouth daily with supper. 12/18/14  Yes [name], FNP  cephALEXin (KEFLEX) 500 MG capsule Take 1 capsule (500 mg total) by mouth every 8 (eight) hours for 10 days. 09/18/18 09/28/18  Kayleen Memos, DO  hydrochlorothiazide (HYDRODIURIL) 25 MG tablet Take 1 tablet (25 mg total) by mouth daily. Patient not taking: Reported on 09/11/2018 04/09/16   Micheline Chapman, NP  metroNIDAZOLE (FLAGYL) 500 MG tablet Take 1 tablet (500 mg total) by mouth every 8 (eight) hours for 10 days. 09/18/18 09/28/18  Kayleen Memos, DO  oxyCODONE (OXY IR/ROXICODONE) 5 MG immediate release tablet Take 1 tablet (5 mg total) by mouth 2 (two) times daily as needed for severe pain or breakthrough pain. 09/18/18   Kayleen Memos, DO  polyethylene glycol (MIRALAX / GLYCOLAX) packet Take 17 g by mouth daily. 09/19/18   Kayleen Memos, DO  senna-docusate (SENOKOT-S) 8.6-50 MG tablet Take 2 tablets by mouth 2 (two) times daily. 09/18/18   Kayleen Memos, DO     Vital Signs: BP (!) 153/84 (BP Location: Left Arm)   Pulse (!) 51   Temp 98.5 F (36.9 C)   Resp 17   Ht 5' 10.5" (1.791 m)   Wt 289 lb 3.9 oz (131.2 kg)   SpO2 96%   BMI 40.92 kg/m  Physical Exam awake/alert; GB drain intact, insertion site ok, not sig tender, output 75 cc green bile; cx- e coli; drain flushed without difficulty  Imaging: Ir Perc Cholecystostomy  Result Date: 09/15/2018 INDICATION: High risk surgical candidate, acute calculus cholecystitis EXAM: Percutaneous cholecystostomy MEDICATIONS: 3.375 g Zosyn; The antibiotic was administered within an appropriate time frame prior to the initiation of the procedure. ANESTHESIA/SEDATION: Moderate (conscious) sedation was employed during this procedure. A total of Versed 2.0 mg and Fentanyl 50 mcg was administered intravenously. Moderate Sedation Time: 10  minutes. The patient's level of consciousness and vital signs were monitored continuously by radiology nursing throughout the procedure under my direct supervision. FLUOROSCOPY TIME:  Fluoroscopy Time: 0 minutes 18 seconds (25 mGy). COMPLICATIONS: None immediate. PROCEDURE: Informed written consent was obtained from the patient after a thorough discussion of the procedural risks, benefits and alternatives. All questions were addressed. Maximal Sterile Barrier Technique was utilized including caps, mask, sterile gowns, sterile gloves, sterile drape, hand hygiene and skin antiseptic. A timeout was performed prior to the initiation of the procedure. Previous imaging reviewed. Preliminary ultrasound performed. The gallbladder was localized in the right upper quadrant beneath the subcostal margin. Overlying skin marked. Under sterile conditions and local anesthesia, an 18 gauge 15 cm access needle was advanced percutaneously from a transhepatic approach into the gallbladder. Needle position confirmed with ultrasound. There was return of exudative bile. Sample sent for culture. Guidewire inserted followed by tract dilatation to insert a pigtail drain. Drain catheter position confirmed with fluoroscopy. Images obtained for documentation. 100 cc bile removed by syringe aspiration. Catheter connected to external gravity drainage bag. Catheter secured with Prolene suture and a sterile dressing. No immediate complication. Patient tolerated the procedure well. IMPRESSION: Successful ultrasound fluoroscopic percutaneous transhepatic cholecystostomy Electronically Signed   By: Jerilynn Mages.  Shick M.D.   On: 09/15/2018 15:53    Labs:  CBC: Recent Labs    09/13/18 1438 09/13/18 1445 09/14/18 0626 09/15/18 0718 09/16/18 0646  WBC 21.2*  --  18.3* 18.2* 12.1*  HGB 12.7* 12.9* 11.4* 10.5* 9.7*  HCT 35.9* 38.0* 31.8* 29.4* 27.0*  PLT 121*  --  97* 120* 152    COAGS: Recent Labs    09/15/18 1109  INR 1.29    BMP: Recent  Labs    09/14/18 0626 09/15/18 0718 09/16/18 0646 09/17/18 0537  NA 131* 134* 136 137  K 3.7 3.6 3.6 3.6  CL 100 104 103 104  CO2 20* 20* 23 24  GLUCOSE 186* 169* 151* 157*  BUN 37* 29* 26* 21  CALCIUM 8.0* 8.3* 8.3* 8.6*  CREATININE 2.04* 1.57* 1.40* 1.16  GFRNONAA 32* 44* 50* >60  GFRAA 37* 51* 58* >60    LIVER FUNCTION TESTS: Recent Labs    09/11/18 0622 09/13/18 1438 09/14/18 0626  BILITOT 0.7 1.4* 1.5*  AST 29 31 19   ALT 35 50* 37  ALKPHOS 38 46 45  PROT 8.1 7.7 7.1  ALBUMIN 4.6 3.8 3.7    Assessment and Plan: Pt with hx acute calculous cholecystitis; s/p perc GB drain 1/10; afebrile; no new labs; bile cx- e coli; for d/c home today per primary team; rec once daily flushing of drain with 5 cc sterile NS, output monitoring and dressing changes every 1-2 days; will schedule for IR drain clinic f/u in 4 weeks; saline flush syringe prescription and output cards given to pt   Electronically Signed: D. Rowe Robert, PA-C 09/18/2018, 2:49 PM   I spent a total of 20 minutes at the  the patient's bedside AND on the patient's hospital floor or unit, greater than 50% of which was counseling/coordinating care for gallbladder drain    Patient ID: Matthew Ochoa., male   DOB: Jan 06, 1946, 73 y.o.   MRN: [id number]

## 2018-09-18 NOTE — Discharge Summary (Signed)
Discharge Summary  [name] BOF:[id number] DOB: 10/28/1945  PCP: Seward Carol, MD  Admit date: 09/13/2018 Discharge date: 09/18/2018  Time spent: 35 minutes  Recommendations for Outpatient Follow-up:  1. Follow-up with GI 2. Follow-up with interventional radiology 3. Follow-up with your PCP 4. Take your medications as prescribed 5. Continue physical therapy 6. Fall precautions  Discharge Diagnoses:  Active Hospital Problems   Diagnosis Date Noted  . Acute cholecystitis 09/13/2018    Resolved Hospital Problems  No resolved problems to display.    Discharge Condition: Stable  Diet recommendation: Resume previous diet.  Heart healthy carb modified diet.  Vitals:   09/17/18 2110 09/18/18 0550  BP: (!) 148/88 (!) 159/84  Pulse: (!) 56 (!) 51  Resp: 15 15  Temp: 98.5 F (36.9 C) 98 F (36.7 C)  SpO2: 98% 97%    History of present illness:   Matthew Ochoais a 73 y.o.malewith medical history significant forhypertension, type 2 diabetes, paroxysmal A. fib on Xarelto, diastolic CHF, who presented to [facility] ED with complaints of persistent abdominal pain for 1 week duration. Went to his primary care provider today where Matthew Ochoa was found to be febrile and was advised to report to the ED for further evaluation.  Upon presentation to the ED,patient is febrile with T-max of 102.1. Lab studies remarkable for leukocytosis with WBC of 21,000,lactic acid 2.41, elevated ALT and total bilirubin. Right upper quadrant ultrasound revealed findings consistent with acute cholecystitis.TRH asked to admit for medical management.  Perc chole placed on 09/15/2018 by interventional radiology, Matthew Ochoa resumed that evening.   Gently reintroduced diet and advanced as tolerated.  Matthew Ochoa tolerated a solid diet well.  09/18/2018: Patient seen and examined at bedside.  No acute events overnight.  Matthew Ochoa has no new complaints.  Right upper quadrant abdominal pain present only upon movement with  exertion.  No new complaints.  On the day of discharge, the patient was hemodynamically stable.  Matthew Ochoa will need to follow-up with his primary care provider, general surgery, interventional radiology, posthospitalization.  Matthew Ochoa will also need to continue physical therapy at home.  Take his medications as prescribed.  Hospital Course:  Active Problems:   Acute cholecystitis  Sepsis secondary to acute cholecystitis POD #3 post Perc Chole by interventional radiology Presented with WBC 21,000, T-max of 102.1, lactic acid 2.41 Switched IV antibiotics to Zosyn Blood cultures x2 peripherally no growth to date Gallbladder abscess culture grew E. coli, pansensitive DC Zosyn and start oral Cipro and oral Flagyl 3 times daily x10 days Follow-up with general surgery and interventional radiology, call for an appointment within a week Pain management and bowel regimen in place  Resolving AKI suspect prerenal secondary to dehydration versus sepsis with hypotension Creatinine continues to improve to 1.1 from 1.40 from 2.69  Baseline creatinine 1.0 with GFR greater than 60 Presented with creatinine of 2.69 with GFR of 26 Continue to hold off HCTZ and losartan.  Defer restarting those 2 medications to your PCP Follow-up with your PCP posthospitalization  Microcytic anemia Iron deficiency anemia Continue ferrous sulfate Follow-up with your primary care provider No sign of overt bleeding  Resolved hypovolemic hyponatremia Continue to hold off HCTZ Defer to PCP to restart HCTZ Follow-up with your PCP posthospitalization  Chronic diastolic CHF Continue strict I's and O's and daily weight Continue Lasix 40 mg daily and spironolactone Continue to hold off losartan due to recent AKI  Type 2 diabetes with hyperglycemia Renal function is back to baseline GFR greater than 60  Resume metformin and glimepiride Follow-up with your PCP post authorization Most recent hemoglobin A1c 7.7    Hypertension Continue amlodipine 5 mg daily  Continue Lasix 40 mg daily  Continue spironolactone Continue to hold off losartan due to recent AKI Continue to hold off HCTZ due to recent hyponatremia F/u with your PCP  Hyperlipidemia Resume statin  Paroxysmal A. fib/history of DVT and PE on Xarelto Continue Xarelto tonight 09/15/2018 Continue spironolactone  Chronic constipation Continue Senokot 2 tablets twice daily and MiraLAX 17 g daily  OSA Continue CPAP at night  Coronary artery disease status post PCI with stenting x1 Continue home meds aspirin, statin     Code Status:Full code   Consults called:General surgery, interventional radiology, cardiology     Discharge Exam: BP (!) 159/84 (BP Location: Left Arm) Comment: nurse notified  Pulse (!) 51   Temp 98 F (36.7 C) (Oral)   Resp 15   Ht 5' 10.5" (1.791 m)   Wt 131.2 kg   SpO2 97%   BMI 40.92 kg/m  . General: 73 y.o. year-old male well developed well nourished in no acute distress.  Alert and oriented x3. . Cardiovascular: Regular rate and rhythm with no rubs or gallops.  No thyromegaly or JVD noted.   Marland Kitchen Respiratory: Clear to auscultation with no wheezes or rales. Good inspiratory effort. . Abdomen: Soft nontender nondistended with normal bowel sounds x4 quadrants.  Right upper quadrant drain in place. . Musculoskeletal: Trace lower extremity edema. 2/4 pulses in all 4 extremities. Marland Kitchen Psychiatry: Mood is appropriate for condition and setting  Discharge Instructions You were cared for by a hospitalist during your hospital stay. If you have any questions about your discharge medications or the care you received while you were in the hospital after you are discharged, you can call the unit and asked to speak with the hospitalist on call if the hospitalist that took care of you is not available. Once you are discharged, your primary care physician will handle any further medical issues. Please note  that NO REFILLS for any discharge medications will be authorized once you are discharged, as it is imperative that you return to your primary care physician (or establish a relationship with a primary care physician if you do not have one) for your aftercare needs so that they can reassess your need for medications and monitor your lab values.   Allergies as of 09/18/2018      Reactions   Lisinopril Cough      Medication List    STOP taking these medications   hydrochlorothiazide 25 MG tablet Commonly known as:  HYDRODIURIL   losartan 100 MG tablet Commonly known as:  COZAAR     TAKE these medications   acetaminophen 325 MG tablet Commonly known as:  TYLENOL Take 325-650 mg by mouth every 6 (six) hours as needed for mild pain or headache.   amiodarone 200 MG tablet Commonly known as:  PACERONE Take 100 mg by mouth every Monday, Wednesday, and Friday.   amLODipine 5 MG tablet Commonly known as:  NORVASC Take 1 tablet (5 mg total) by mouth daily.   aspirin 81 MG chewable tablet Chew 81 mg by mouth daily.   atorvastatin 40 MG tablet Commonly known as:  LIPITOR TAKE 1 TABLET( 40 MG TOTAL) BY MOUTH EVERY EVENING What changed:    how much to take  how to take this  when to take this   cephALEXin 500 MG capsule Commonly known as:  KEFLEX Take  1 capsule (500 mg total) by mouth every 8 (eight) hours for 10 days.   finasteride 5 MG tablet Commonly known as:  PROSCAR Take 5 mg by mouth daily.   furosemide 40 MG tablet Commonly known as:  LASIX Take 40 mg by mouth daily.   glimepiride 4 MG tablet Commonly known as:  AMARYL Take 4 mg by mouth daily with breakfast.   latanoprost 0.005 % ophthalmic solution Commonly known as:  XALATAN Place 1 drop into both eyes at bedtime.   metFORMIN 500 MG tablet Commonly known as:  GLUCOPHAGE Take 1 tablet (500 mg total) by mouth 3 (three) times daily. What changed:    how much to take  when to take this  additional  instructions   metroNIDAZOLE 500 MG tablet Commonly known as:  FLAGYL Take 1 tablet (500 mg total) by mouth every 8 (eight) hours for 10 days.   nitroGLYCERIN 0.4 MG SL tablet Commonly known as:  NITROSTAT Place 0.4 mg under the tongue every 5 (five) minutes as needed. For chest pain   oxyCODONE 5 MG immediate release tablet Commonly known as:  Oxy IR/ROXICODONE Take 1 tablet (5 mg total) by mouth 2 (two) times daily as needed for severe pain or breakthrough pain.   polyethylene glycol packet Commonly known as:  MIRALAX / GLYCOLAX Take 17 g by mouth daily. Start taking on:  September 19, 2018   rivaroxaban 20 MG Tabs tablet Commonly known as:  XARELTO Take 1 tablet (20 mg total) by mouth daily with supper.   senna-docusate 8.6-50 MG tablet Commonly known as:  Senokot-S Take 2 tablets by mouth 2 (two) times daily.      Allergies  Allergen Reactions  . Lisinopril Cough   Follow-up Information    [facility] Follow [facility].   Specialty:  Home Health Services Why:  Home Health RN and aide-agency will call to arrange visits Contact information: [address] [place] [address] [phone]        Armandina Gemma, MD. Go on 10/05/2018.   Specialty:  General Surgery Why:  Your appointment is 10/05/2018 @ 4pm Please arrive 30 minutes prior to your appointment to check in and fill out paperwork. Bring photo ID and insurance information. Contact information: [address] [place] [address] [phone]        Seward Carol, MD. Call in 1 day(s).   Specialty:  Internal Medicine Why:  Please call for a post hospital follow-up appointment. Contact information: [address] [place] [address] [phone]        Jacqulynn Cadet, MD. Call in 1 day(s).   Specialties:  Interventional Radiology, Radiology Why:  Please call for post hospital follow-up appointment within a week. Contact information: [place]  [address] [phone]            The results of significant diagnostics from this hospitalization (including imaging, microbiology, ancillary and laboratory) are listed below for reference.    Significant Diagnostic Studies: Dg Chest 1 View  Result Date: 09/13/2018 CLINICAL DATA:  Elevated white blood cell count today. EXAM: CHEST  1 VIEW COMPARISON:  Single-view of the chest 09/11/2018. PA and lateral chest 10/25/2015. CT chest 10/25/2015. FINDINGS: The lungs are clear. Heart size is upper normal. No pneumothorax or pleural effusion. Aortic atherosclerosis is noted. No acute or focal bony abnormality. IMPRESSION: No acute disease. Atherosclerosis. Electronically Signed   By: Inge Rise M.D.  On: 09/13/2018 15:11   Ct Abdomen Pelvis W Contrast  Result Date: 09/11/2018 CLINICAL DATA:  Abdominal distention. EXAM: CT ABDOMEN AND PELVIS WITH CONTRAST TECHNIQUE: Multidetector CT imaging of the abdomen and pelvis was performed using the standard protocol following bolus administration of intravenous contrast. CONTRAST:  175mL ISOVUE-300 IOPAMIDOL (ISOVUE-300) INJECTION 61% COMPARISON:  CT scan dated 06/11/2015 FINDINGS: Lower chest: No acute abnormality. Aortic and coronary atherosclerosis. Slight scarring in the right middle lobe, stable. Hepatobiliary: Hepatic steatosis. Extensive cholelithiasis. Slight new thickening and edema of the gallbladder wall. No dilated bile ducts. Pancreas: Unremarkable. No pancreatic ductal dilatation or surrounding inflammatory changes. Spleen: Chronic calcified granulomas. Otherwise normal. Adrenals/Urinary Tract: Adrenal glands are normal. Multiple stable bilateral renal cysts. No hydronephrosis. Bladder is normal. Stomach/Bowel: There are multiple diverticula scattered throughout the colon. There are also numerous diverticula in the terminal ileum. Small bowel is otherwise normal. Stomach and appendix are normal.  Vascular/Lymphatic: Aortic atherosclerosis. No enlarged abdominal or pelvic lymph nodes. Reproductive: Prostate is unremarkable. Other: Small right inguinal hernia containing only fat. Two tiny periumbilical hernias containing only fat. These are unchanged. No ascites. Musculoskeletal: No acute abnormality. Multilevel chronic degenerative disc disease in the lower lumbar spine. IMPRESSION: 1. Extensive cholelithiasis with new slight thickening and edema of the gallbladder wall. 2. Extensive diverticulosis of the colon and of the terminal ileum. Aortic Atherosclerosis (ICD10-I70.0). Electronically Signed   By: Lorriane Shire M.D.   On: 09/11/2018 10:47   Ir Perc Cholecystostomy  Result Date: 09/15/2018 INDICATION: High risk surgical candidate, acute calculus cholecystitis EXAM: Percutaneous cholecystostomy MEDICATIONS: 3.375 g Zosyn; The antibiotic was administered within an appropriate time frame prior to the initiation of the procedure. ANESTHESIA/SEDATION: Moderate (conscious) sedation was employed during this procedure. A total of Versed 2.0 mg and Fentanyl 50 mcg was administered intravenously. Moderate Sedation Time: 10 minutes. The patient's level of consciousness and vital signs were monitored continuously by radiology nursing throughout the procedure under my direct supervision. FLUOROSCOPY TIME:  Fluoroscopy Time: 0 minutes 18 seconds (25 mGy). COMPLICATIONS: None immediate. PROCEDURE: Informed written consent was obtained from the patient after a thorough discussion of the procedural risks, benefits and alternatives. All questions were addressed. Maximal Sterile Barrier Technique was utilized including caps, mask, sterile gowns, sterile gloves, sterile drape, hand hygiene and skin antiseptic. A timeout was performed prior to the initiation of the procedure. Previous imaging reviewed. Preliminary ultrasound performed. The gallbladder was localized in the right upper quadrant beneath the subcostal  margin. Overlying skin marked. Under sterile conditions and local anesthesia, an 18 gauge 15 cm access needle was advanced percutaneously from a transhepatic approach into the gallbladder. Needle position confirmed with ultrasound. There was return of exudative bile. Sample sent for culture. Guidewire inserted followed by tract dilatation to insert a pigtail drain. Drain catheter position confirmed with fluoroscopy. Images obtained for documentation. 100 cc bile removed by syringe aspiration. Catheter connected to external gravity drainage bag. Catheter secured with Prolene suture and a sterile dressing. No immediate complication. Patient tolerated the procedure well. IMPRESSION: Successful ultrasound fluoroscopic percutaneous transhepatic cholecystostomy Electronically Signed   By: Jerilynn Mages.  Shick M.D.   On: 09/15/2018 15:53   Dg Abdomen Acute W/chest  Result Date: 09/11/2018 CLINICAL DATA:  Abdominal pain and distension beginning today. EXAM: DG ABDOMEN ACUTE W/ 1V CHEST COMPARISON:  10/25/2015 FINDINGS: One-view chest is unremarkable without evidence of infiltrate, collapse or effusion. No free air seen under the diaphragm. Abdominal radiographs show a moderate amount of fecal material throughout the colon. No  sign of small bowel pathology. No acute calcifications or bone findings. There is chronic arterial calcification and chronic lumbar degenerative change. IMPRESSION: Negative acute abdominal series. Moderate amount of fecal material throughout the colon. Electronically Signed   By: Nelson Chimes M.D.   On: 09/11/2018 07:10   US Abdomen Limited Ruq  Result Date: 09/13/2018 CLINICAL DATA:  Right upper quadrant pain EXAM: ULTRASOUND ABDOMEN LIMITED RIGHT UPPER QUADRANT COMPARISON:  CT 09/11/2018 FINDINGS: Gallbladder: Multiple gallstones measuring up to 1.9 cm. Gallbladder wall thickening 5.5 mm. Positive sonographic Murphy sign. Mild pericholecystic fluid. Common bile duct: Diameter: Not adequately visualized  for measurement. No biliary dilatation on recent CT Liver: Liver is extremely echogenic and difficult to evaluate by ultrasound. Fatty liver on CT. No focal lesion in the liver. Portal vein is patent on color Doppler imaging with normal direction of blood flow towards the liver. IMPRESSION: Cholelithiasis. Findings suggestive of acute cholecystitis with gallbladder wall thickening, positive sonographic Murphy sign, and pericholecystic fluid. Very echogenic liver compatible with fatty infiltration. Electronically Signed   By: Franchot Gallo M.D.   On: 09/13/2018 16:01    Microbiology: Recent Results (from the past 240 hour(s))  Blood Culture (routine x 2)     Status: None   Collection Time: 09/13/18  2:30 PM  Result Value Ref Range Status   Specimen Description   Final    BLOOD LEFT ANTECUBITAL Performed at [facility] [address] [place], [place] [address]    Special Requests   Final    BOTTLES DRAWN AEROBIC AND ANAEROBIC Blood Culture adequate volume Performed at [facility] [address] [place], [place] [address]    Culture   Final    NO GROWTH 5 DAYS Performed at [facility], [place] [address] [place], [place] [address]    Report Status 09/18/2018 FINAL  Final  Blood Culture (routine x 2)     Status: None   Collection Time: 09/13/18  2:40 PM  Result Value Ref Range Status   Specimen Description   Final    BLOOD RIGHT HAND Performed at [facility] [address], [place], [place] [address]    Special Requests   Final    BOTTLES DRAWN AEROBIC AND ANAEROBIC Blood Culture adequate volume Performed at [facility] [address], [place], [place] [address]    Culture   Final    NO GROWTH 5 DAYS Performed at [facility], [place] [address] [place], [place] [address]    Report Status 09/18/2018 FINAL  Final  Culture, Urine     Status: None   Collection Time: 09/15/18  8:54 AM  Result  Value Ref Range Status   Specimen Description URINE, CLEAN CATCH  Final   Special Requests   Final    NONE Performed at [facility] [address], [place], [place] [address]    Culture NO GROWTH  Final   Report Status 09/16/2018 FINAL  Final  Aerobic/Anaerobic Culture (surgical/deep wound)     Status: None (Preliminary result)   Collection Time: 09/15/18  3:35 PM  Result Value Ref Range Status   Specimen Description   Final    GALL BLADDER ABSCESS Performed at [facility] [address], [place], [place] [address]    Special Requests   Final    Normal Performed at [facility], [place] [address], [place], [place] [address]    Gram Stain  Final    RARE WBC PRESENT, PREDOMINANTLY PMN FEW GRAM NEGATIVE RODS    Culture   Final    ABUNDANT ESCHERICHIA COLI NO ANAEROBES ISOLATED; CULTURE IN PROGRESS FOR 5 DAYS    Report Status PENDING  Incomplete   Organism ID, Bacteria ESCHERICHIA COLI  Final      Susceptibility   Escherichia coli - MIC*    AMPICILLIN <=2 SENSITIVE Sensitive     CEFAZOLIN <=4 SENSITIVE Sensitive     CEFEPIME <=1 SENSITIVE Sensitive     CEFTAZIDIME <=1 SENSITIVE Sensitive     CEFTRIAXONE <=1 SENSITIVE Sensitive     CIPROFLOXACIN <=0.25 SENSITIVE Sensitive     GENTAMICIN <=1 SENSITIVE Sensitive     IMIPENEM <=0.25 SENSITIVE Sensitive     TRIMETH/SULFA <=20 SENSITIVE Sensitive     AMPICILLIN/SULBACTAM <=2 SENSITIVE Sensitive     PIP/TAZO <=4 SENSITIVE Sensitive     Extended ESBL NEGATIVE Sensitive     * ABUNDANT ESCHERICHIA COLI     Labs: Basic Metabolic Panel: Recent Labs  Lab 09/13/18 1438 09/13/18 1445 09/14/18 0626 09/15/18 0718 09/16/18 0646 09/17/18 0537  NA 133* 132* 131* 134* 136 137  K 4.2 4.2 3.7 3.6 3.6 3.6  CL 99 99 100 104 103 104  CO2 22  --  20* 20* 23 24  GLUCOSE 194* 196* 186* 169* 151* 157*  BUN 33* 40* 37* 29* 26* 21  CREATININE 2.69* 2.70* 2.04* 1.57* 1.40* 1.16   CALCIUM 8.7*  --  8.0* 8.3* 8.3* 8.6*   Liver Function Tests: Recent Labs  Lab 09/13/18 1438 09/14/18 0626  AST 31 19  ALT 50* 37  ALKPHOS 46 45  BILITOT 1.4* 1.5*  PROT 7.7 7.1  ALBUMIN 3.8 3.7   Recent Labs  Lab 09/13/18 1438  LIPASE 32   No results for input(s): AMMONIA in the last 168 hours. CBC: Recent Labs  Lab 09/13/18 1438 09/13/18 1445 09/14/18 0626 09/15/18 0718 09/16/18 0646  WBC 21.2*  --  18.3* 18.2* 12.1*  NEUTROABS 17.9*  --   --   --   --   HGB 12.7* 12.9* 11.4* 10.5* 9.7*  HCT 35.9* 38.0* 31.8* 29.4* 27.0*  MCV 78.2*  --  76.8* 77.0* 75.8*  PLT 121*  --  97* 120* 152   Cardiac Enzymes: No results for input(s): CKTOTAL, CKMB, CKMBINDEX, TROPONINI in the last 168 hours. BNP: BNP (last 3 results) Recent Labs    09/11/18 0622  BNP 31.6    ProBNP (last 3 results) No results for input(s): PROBNP in the last 8760 hours.  CBG: Recent Labs  Lab 09/17/18 1141 09/17/18 1706 09/17/18 2113 09/18/18 0750 09/18/18 1135  GLUCAP 191* 150* 176* 177* 219*       Signed:  Kayleen Memos, MD [facility] 09/18/2018, 1:41 PM

## 2018-09-18 NOTE — Care Management Important Message (Signed)
Important Message  Patient Details  Name: Matthew Ochoa. MRN: [id number] Date of Birth: [birth year]   Medicare Important Message Given:  Yes    Kerin Salen 09/18/2018, 11:49 AMImportant Message  Patient Details  Name: Matthew Ochoa. MRN: [id number] Date of Birth: 04-Apr-1946   Medicare Important Message Given:  Yes    Kerin Salen 09/18/2018, 11:49 AM

## 2018-09-18 NOTE — Progress Notes (Addendum)
[facility] Progress Note     Subjective: CC-  Up in chair. Overall doing well. Abdominal pain improving. Tolerating diet. Denies n/v. Passing flatus. Last BM 6 days ago. States that he's going home today and wants to wait until he gets there to have BM, plans to pick up enema and Miralax on his way home. Feels comfortable flushing drain.  Objective: Vital signs in last 24 hours: Temp:  [98 F (36.7 C)-98.6 F (37 C)] 98 F (36.7 C) (01/13 0550) Pulse Rate:  [51-57] 51 (01/13 0550) Resp:  [15] 15 (01/13 0550) BP: (131-159)/(76-88) 159/84 (01/13 0550) SpO2:  [94 %-98 %] 97 % (01/13 0550) Weight:  [131.2 kg] 131.2 kg (01/13 0550) Last BM Date: 09/17/18  Intake/Output from previous day: 01/12 0701 - 01/13 0700 In: 690 [P.O.:690] Out: 650 [Urine:550; Drains:100] Intake/Output this shift: Total I/O In: -  Out: 75 [Drains:75]  PE: Gen:  Alert, NAD, pleasant HEENT: EOM's intact, pupils equal and round Pulm:  effort normal Abd: Soft, distended, +BS, perc chole tube with golden bile in drain (100cc output recorded last 24 hours) Psych: A&Ox3  Skin: no rashes noted, warm and dry  Lab Results:  Recent Labs    09/16/18 0646  WBC 12.1*  HGB 9.7*  HCT 27.0*  PLT 152   BMET Recent Labs    09/16/18 0646 09/17/18 0537  NA 136 137  K 3.6 3.6  CL 103 104  CO2 23 24  GLUCOSE 151* 157*  BUN 26* 21  CREATININE 1.40* 1.16  CALCIUM 8.3* 8.6*   PT/INR Recent Labs    09/15/18 1109  LABPROT 15.9*  INR 1.29   CMP     Component Value Date/Time   NA 137 09/17/2018 0537   K 3.6 09/17/2018 0537   CL 104 09/17/2018 0537   CO2 24 09/17/2018 0537   GLUCOSE 157 (H) 09/17/2018 0537   BUN 21 09/17/2018 0537   CREATININE 1.16 09/17/2018 0537   CREATININE 1.08 03/19/2015 1128   CALCIUM 8.6 (L) 09/17/2018 0537   PROT 7.1 09/14/2018 0626   ALBUMIN 3.7 09/14/2018 0626   AST 19 09/14/2018 0626   ALT 37 09/14/2018 0626   ALKPHOS 45 09/14/2018 0626   BILITOT 1.5  (H) 09/14/2018 0626   GFRNONAA >60 09/17/2018 0537   GFRNONAA 70 03/19/2015 1128   GFRAA >60 09/17/2018 0537   GFRAA 81 03/19/2015 1128   Lipase     Component Value Date/Time   LIPASE 32 09/13/2018 1438       Studies/Results: No results found.  Anti-infectives: Anti-infectives (From admission, onward)   Start     Dose/Rate Route Frequency Ordered Stop   09/15/18 2200  piperacillin-tazobactam (ZOSYN) IVPB 3.375 g     3.375 g 12.5 mL/hr over 240 Minutes Intravenous Every 8 hours 09/15/18 1636     09/15/18 1530  piperacillin-tazobactam (ZOSYN) IVPB 3.375 g     3.375 g 100 mL/hr over 30 Minutes Intravenous  Once 09/15/18 1517 09/15/18 1540   09/14/18 2000  metroNIDAZOLE (FLAGYL) IVPB 500 mg  Status:  Discontinued     500 mg 100 mL/hr over 60 Minutes Intravenous Every 8 hours 09/14/18 1233 09/15/18 1543   09/13/18 2200  ceFEPIme (MAXIPIME) 1 g in sodium chloride 0.9 % 100 mL IVPB  Status:  Discontinued     1 g 200 mL/hr over 30 Minutes Intravenous Every 12 hours 09/13/18 1806 09/15/18 1543   09/13/18 1500  metroNIDAZOLE (FLAGYL) IVPB 500 mg  Status:  Discontinued  500 mg 100 mL/hr over 60 Minutes Intravenous Every 8 hours 09/13/18 1452 09/14/18 1233   09/13/18 1500  ceFEPIme (MAXIPIME) 2 g in sodium chloride 0.9 % 100 mL IVPB     2 g 200 mL/hr over 30 Minutes Intravenous  Once 09/13/18 1455 09/13/18 1541       Assessment/Plan OSA DM HTN CAD s/p stent A fib on xarelto H/o DVT/PE COPD  Acute cholecystitis S/p percutaneous cholecystostomy tube 1/10 - cx grew E coli, pansensitive - Patient stable for discharge from surgical standpoint with drain and oral antibiotics. F/u info with Dr. Harlow Asa on AVS. Patient also needs f/u with IR.  ID - zosyn 1/10>>day#4, maxipime/flagyl 1/8>>1/100 FEN - HH/CM diet VTE - Xarelto Foley - none Follow up - Dr. Harlow Asa, IR, PCP   LOS: 5 days    [place] , [facility] 09/18/2018, 9:10 AM Pager:  [phone] Mon 7:00 am -11:30 AM Tues-Fri 7:00 am-4:30 pm Sat-Sun 7:00 am-11:30 am

## 2018-09-18 NOTE — Progress Notes (Signed)
Physical Therapy Treatment Patient Details Name: Matthew Ochoa. MRN: [id number] DOB: 07/05/1946 Today's Date: 09/18/2018    History of Present Illness 73 yo male admitted to ED on 09/11/18 with acute cholecystitis. s/p perc cholecystostomy 1/10. PMH includes COPD, CAD with stenting, afib, history of DVT and PE, HTN, DM, HLD, sleep apnea.     PT Comments    Pt with improved ambulation distance today, and performed bed mobility and stair navigation proficiently. Pt with some unsteadiness that required PT assist during ambulation, and pt states that he has never really had balance issues before. PT updated D/C recommendations to include HHPT, RN notified and RN paged MD to update pt's d/c plans. PT to continue to follow acutely.     Follow Up Recommendations  Supervision for mobility/OOB;Home health PT     Equipment Recommendations  None recommended by PT    Recommendations for Other Services       Precautions / Restrictions Precautions Precautions: Fall Restrictions Weight Bearing Restrictions: No    Mobility  Bed Mobility Overal bed mobility: Needs Assistance Bed Mobility: Rolling;Sidelying to Sit;Sit to Sidelying Rolling: Min guard Sidelying to sit: Supervision     Sit to sidelying: Supervision General bed mobility comments: PT instructed pt in log rolling and sidelying<>sit technique to improve abdominal comfort with mobility. Pt performed this well, required increased time and use of bed rails occasionally. Supervision to min guard for pt safety.   Transfers Overall transfer level: Needs assistance Equipment used: None Transfers: Sit to/from Stand Sit to Stand: Supervision         General transfer comment: Supervision for safety. Increased time to rise and use of arm rests to power up.   Ambulation/Gait Ambulation/Gait assistance: Min guard;Min assist Gait Distance (Feet): 350 Feet Assistive device: None Gait Pattern/deviations: Step-through  pattern;Decreased stride length;Wide base of support;Antalgic Gait velocity: decr    General Gait Details: Min guard for safety for majority of ambulation, occasional min assist for 2 LOB. Pt with wide base ambulation, and pain/fatigue limiting ambulation distance.    Stairs Stairs: Yes Stairs assistance: Min guard Stair Management: One rail Right;Alternating pattern;Forwards Number of Stairs: 5 General stair comments: Min guard for safety. Pt with use of both hands on railing at time for increased steadiness. Increased time and effort to perform, HR and RR increased with stairs to 116 bpm and 30s, respectively.    Wheelchair Mobility    Modified Rankin (Stroke Patients Only)       Balance Overall balance assessment: Needs assistance Sitting-balance support: No upper extremity supported Sitting balance-Leahy Scale: Good     Standing balance support: No upper extremity supported Standing balance-Leahy Scale: Fair Standing balance comment: Pt unable to accept challenge, 2 LOB corrected by pt and PT during ambulation                             Cognition Arousal/Alertness: Awake/alert Behavior During Therapy: WFL for tasks assessed/performed Overall Cognitive Status: Within Functional Limits for tasks assessed                                 General Comments: Pt pleasant with PT today       Exercises      General Comments        Pertinent Vitals/Pain Pain Assessment: 0-10 Pain Score: 5  Pain Location: abdomen, R side Pain Descriptors / Indicators: Sore;Aching  Pain Intervention(s): Limited activity within patient's tolerance;Monitored during session    Home Living                      Prior Function            PT Goals (current goals can now be found in the care plan section) Acute Rehab PT Goals Patient Stated Goal: decrease abdominal pain  PT Goal Formulation: With patient Time For Goal Achievement: 09/28/18 Potential  to Achieve Goals: Good Progress towards PT goals: Progressing toward goals    Frequency    Min 3X/week      PT Plan Discharge plan needs to be updated    Co-evaluation              AM-PAC PT "6 Clicks" Mobility   Outcome Measure  Help needed turning from your back to your side while in a flat bed without using bedrails?: A Little Help needed moving from lying on your back to sitting on the side of a flat bed without using bedrails?: A Little Help needed moving to and from a bed to a chair (including a wheelchair)?: A Little Help needed standing up from a chair using your arms (e.g., wheelchair or bedside chair)?: None Help needed to walk in hospital room?: A Little Help needed climbing 3-5 steps with a railing? : A Little 6 Click Score: 19    End of Session   Activity Tolerance: Patient limited by pain;Patient limited by fatigue Patient left: in chair;with call bell/phone within reach Nurse Communication: Mobility status PT Visit Diagnosis: Other abnormalities of gait and mobility (R26.89);Pain Pain - Right/Left: Right Pain - part of body: (abdomen)     Time: 7169-6789 PT Time Calculation (min) (ACUTE ONLY): 12 min  Charges:  $Gait Training: 8-22 mins                     Julien Girt, PT Acute Rehabilitation Services Pager [phone]  Office [phone]   Roxine Caddy D Elonda Husky 09/18/2018, 2:23 PM

## 2018-09-19 ENCOUNTER — Other Ambulatory Visit: Payer: PRIVATE HEALTH INSURANCE

## 2018-09-19 ENCOUNTER — Other Ambulatory Visit: Payer: Self-pay | Admitting: Surgery

## 2018-09-19 DIAGNOSIS — E1165 Type 2 diabetes mellitus with hyperglycemia: Secondary | ICD-10-CM | POA: Diagnosis not present

## 2018-09-19 DIAGNOSIS — I110 Hypertensive heart disease with heart failure: Secondary | ICD-10-CM | POA: Diagnosis not present

## 2018-09-19 DIAGNOSIS — Z7982 Long term (current) use of aspirin: Secondary | ICD-10-CM | POA: Diagnosis not present

## 2018-09-19 DIAGNOSIS — K810 Acute cholecystitis: Secondary | ICD-10-CM

## 2018-09-19 DIAGNOSIS — J449 Chronic obstructive pulmonary disease, unspecified: Secondary | ICD-10-CM | POA: Diagnosis not present

## 2018-09-19 DIAGNOSIS — I2510 Atherosclerotic heart disease of native coronary artery without angina pectoris: Secondary | ICD-10-CM | POA: Diagnosis not present

## 2018-09-19 DIAGNOSIS — Z7984 Long term (current) use of oral hypoglycemic drugs: Secondary | ICD-10-CM | POA: Diagnosis not present

## 2018-09-19 DIAGNOSIS — I5032 Chronic diastolic (congestive) heart failure: Secondary | ICD-10-CM | POA: Diagnosis not present

## 2018-09-19 DIAGNOSIS — Z7901 Long term (current) use of anticoagulants: Secondary | ICD-10-CM | POA: Diagnosis not present

## 2018-09-19 DIAGNOSIS — I480 Paroxysmal atrial fibrillation: Secondary | ICD-10-CM | POA: Diagnosis not present

## 2018-09-20 DIAGNOSIS — I110 Hypertensive heart disease with heart failure: Secondary | ICD-10-CM | POA: Diagnosis not present

## 2018-09-20 DIAGNOSIS — E1165 Type 2 diabetes mellitus with hyperglycemia: Secondary | ICD-10-CM | POA: Diagnosis not present

## 2018-09-20 DIAGNOSIS — K810 Acute cholecystitis: Secondary | ICD-10-CM | POA: Diagnosis not present

## 2018-09-20 DIAGNOSIS — I2510 Atherosclerotic heart disease of native coronary artery without angina pectoris: Secondary | ICD-10-CM | POA: Diagnosis not present

## 2018-09-20 DIAGNOSIS — J449 Chronic obstructive pulmonary disease, unspecified: Secondary | ICD-10-CM | POA: Diagnosis not present

## 2018-09-20 DIAGNOSIS — I5032 Chronic diastolic (congestive) heart failure: Secondary | ICD-10-CM | POA: Diagnosis not present

## 2018-09-20 LAB — AEROBIC/ANAEROBIC CULTURE W GRAM STAIN (SURGICAL/DEEP WOUND): Special Requests: NORMAL

## 2018-09-20 LAB — AEROBIC/ANAEROBIC CULTURE (SURGICAL/DEEP WOUND)

## 2018-09-21 DIAGNOSIS — I5032 Chronic diastolic (congestive) heart failure: Secondary | ICD-10-CM | POA: Diagnosis not present

## 2018-09-21 DIAGNOSIS — J449 Chronic obstructive pulmonary disease, unspecified: Secondary | ICD-10-CM | POA: Diagnosis not present

## 2018-09-21 DIAGNOSIS — I2510 Atherosclerotic heart disease of native coronary artery without angina pectoris: Secondary | ICD-10-CM | POA: Diagnosis not present

## 2018-09-21 DIAGNOSIS — I110 Hypertensive heart disease with heart failure: Secondary | ICD-10-CM | POA: Diagnosis not present

## 2018-09-21 DIAGNOSIS — K810 Acute cholecystitis: Secondary | ICD-10-CM | POA: Diagnosis not present

## 2018-09-21 DIAGNOSIS — E1165 Type 2 diabetes mellitus with hyperglycemia: Secondary | ICD-10-CM | POA: Diagnosis not present

## 2018-09-25 DIAGNOSIS — I2510 Atherosclerotic heart disease of native coronary artery without angina pectoris: Secondary | ICD-10-CM | POA: Diagnosis not present

## 2018-09-25 DIAGNOSIS — J449 Chronic obstructive pulmonary disease, unspecified: Secondary | ICD-10-CM | POA: Diagnosis not present

## 2018-09-25 DIAGNOSIS — I110 Hypertensive heart disease with heart failure: Secondary | ICD-10-CM | POA: Diagnosis not present

## 2018-09-25 DIAGNOSIS — K810 Acute cholecystitis: Secondary | ICD-10-CM | POA: Diagnosis not present

## 2018-09-25 DIAGNOSIS — E1165 Type 2 diabetes mellitus with hyperglycemia: Secondary | ICD-10-CM | POA: Diagnosis not present

## 2018-09-25 DIAGNOSIS — I5032 Chronic diastolic (congestive) heart failure: Secondary | ICD-10-CM | POA: Diagnosis not present

## 2018-09-26 DIAGNOSIS — I509 Heart failure, unspecified: Secondary | ICD-10-CM | POA: Diagnosis not present

## 2018-09-26 DIAGNOSIS — N179 Acute kidney failure, unspecified: Secondary | ICD-10-CM | POA: Diagnosis not present

## 2018-09-26 DIAGNOSIS — E1169 Type 2 diabetes mellitus with other specified complication: Secondary | ICD-10-CM | POA: Diagnosis not present

## 2018-09-26 DIAGNOSIS — I480 Paroxysmal atrial fibrillation: Secondary | ICD-10-CM | POA: Diagnosis not present

## 2018-09-26 DIAGNOSIS — K819 Cholecystitis, unspecified: Secondary | ICD-10-CM | POA: Diagnosis not present

## 2018-09-26 MED FILL — NORMAL SALINE FLUSH SYRINGE: 0.9 | 15 days supply | Qty: 150 | Fill #0

## 2018-09-28 DIAGNOSIS — I5032 Chronic diastolic (congestive) heart failure: Secondary | ICD-10-CM | POA: Diagnosis not present

## 2018-09-28 DIAGNOSIS — K810 Acute cholecystitis: Secondary | ICD-10-CM | POA: Diagnosis not present

## 2018-09-28 DIAGNOSIS — J449 Chronic obstructive pulmonary disease, unspecified: Secondary | ICD-10-CM | POA: Diagnosis not present

## 2018-09-28 DIAGNOSIS — I2510 Atherosclerotic heart disease of native coronary artery without angina pectoris: Secondary | ICD-10-CM | POA: Diagnosis not present

## 2018-09-28 DIAGNOSIS — I110 Hypertensive heart disease with heart failure: Secondary | ICD-10-CM | POA: Diagnosis not present

## 2018-09-28 DIAGNOSIS — E1165 Type 2 diabetes mellitus with hyperglycemia: Secondary | ICD-10-CM | POA: Diagnosis not present

## 2018-10-03 DIAGNOSIS — J449 Chronic obstructive pulmonary disease, unspecified: Secondary | ICD-10-CM | POA: Diagnosis not present

## 2018-10-03 DIAGNOSIS — I5032 Chronic diastolic (congestive) heart failure: Secondary | ICD-10-CM | POA: Diagnosis not present

## 2018-10-03 DIAGNOSIS — I2510 Atherosclerotic heart disease of native coronary artery without angina pectoris: Secondary | ICD-10-CM | POA: Diagnosis not present

## 2018-10-03 DIAGNOSIS — K810 Acute cholecystitis: Secondary | ICD-10-CM | POA: Diagnosis not present

## 2018-10-03 DIAGNOSIS — I110 Hypertensive heart disease with heart failure: Secondary | ICD-10-CM | POA: Diagnosis not present

## 2018-10-03 DIAGNOSIS — E1165 Type 2 diabetes mellitus with hyperglycemia: Secondary | ICD-10-CM | POA: Diagnosis not present

## 2018-10-04 DIAGNOSIS — Z434 Encounter for attention to other artificial openings of digestive tract: Secondary | ICD-10-CM | POA: Diagnosis not present

## 2018-10-04 DIAGNOSIS — K8000 Calculus of gallbladder with acute cholecystitis without obstruction: Secondary | ICD-10-CM | POA: Diagnosis not present

## 2018-10-10 ENCOUNTER — Ambulatory Visit
Admission: RE | Admit: 2018-10-10 | Discharge: 2018-10-10 | Disposition: A | Discharge status: Home / Self Care | Payer: Medicare Other | Source: Ambulatory Visit | Attending: Surgery | Admitting: Surgery

## 2018-10-10 ENCOUNTER — Ambulatory Visit: Payer: Self-pay | Admitting: Surgery

## 2018-10-10 ENCOUNTER — Encounter: Payer: Self-pay | Admitting: Radiology

## 2018-10-10 ENCOUNTER — Ambulatory Visit
Admission: RE | Admit: 2018-10-10 | Discharge: 2018-10-10 | Disposition: A | Discharge status: Home / Self Care | Payer: Medicare Other | Source: Ambulatory Visit | Attending: Radiology | Admitting: Radiology

## 2018-10-10 DIAGNOSIS — K810 Acute cholecystitis: Secondary | ICD-10-CM

## 2018-10-10 DIAGNOSIS — Z434 Encounter for attention to other artificial openings of digestive tract: Secondary | ICD-10-CM | POA: Diagnosis not present

## 2018-10-10 DIAGNOSIS — K8020 Calculus of gallbladder without cholecystitis without obstruction: Secondary | ICD-10-CM | POA: Diagnosis not present

## 2018-10-10 DIAGNOSIS — Z4659 Encounter for fitting and adjustment of other gastrointestinal appliance and device: Secondary | ICD-10-CM | POA: Diagnosis not present

## 2018-10-10 HISTORY — PX: IR RADIOLOGIST EVAL & MGMT: IMG5224

## 2018-10-10 NOTE — Progress Notes (Signed)
Chief Complaint: Patient was seen in consultation today for cholangiogram  Referring Physician(s): Dr. Harlow Asa  Supervising Physician: Sandi Mariscal  History of Present Illness: Matthew Drumwright. is a 73 y.o. male with past medical history significant for COPD, DM, HTN, HLD, paroxysmal a.fib on Xarelto, CHF, PE, DVT, OSA and recent acute cholecystitis s/p cholecystostomy placement 09/15/18 by Dr. Annamaria Boots who presents today for routine follow up. Patient reports that he has been flushing his drain once daily and recording the output once daily as instructed. He reports that some days nothing is present in the bag and other days it seems that there is quite a bit present, the color has remanied the same since placement and he has not noted any blood. He denies any purulent drainage or bleeding from insertion site. He reports midline abdominal pain that waxes and wanes but no RUQ pain. Appetite has been good, he denies nausea, vomiting or diarrhea. He has not noticed any fevers or chills. He has not yet seen Dr. Harlow Asa to discuss possible intervention - he is concerned about the timing of possible surgical intervention as his wife is unfortunately very ill and will require a procedure in [place] on 3/5 which he needs to be healthy for.   Past Medical History:  Diagnosis Date  . Abdominal pain, right upper quadrant   . Allergic rhinitis   . Asthma    as child  . Atrial enlargement, left y-2  . Benign localized hyperplasia of prostate with urinary obstruction and other lower urinary tract symptoms (LUTS)(600.21)   . Calculus of gallbladder without mention of cholecystitis or obstruction   . COPD (chronic obstructive pulmonary disease) ([place])   . Diabetes mellitus   . DVT (deep venous thrombosis) ([place])   . Hematuria, unspecified   . HTN (hypertension)   . Hyperlipidemia   . Hypertension   . Pulmonary embolism ([place])   . Sleep apnea    NPSG 03-17-98, cpap 14  . Type 2 diabetes mellitus ([place])      Past Surgical History:  Procedure Laterality Date  . ANGIOPLASTY     stent  . COLONOSCOPY    . CORONARY ANGIOPLASTY WITH STENT PLACEMENT    . IR PERC CHOLECYSTOSTOMY  09/15/2018  . IR RADIOLOGIST EVAL & MGMT  10/10/2018  . POLYPECTOMY      Allergies: Lisinopril  Medications: Prior to Admission medications   Medication Sig Start Date End Date Taking? Authorizing Provider  acetaminophen (TYLENOL) 325 MG tablet Take 325-650 mg by mouth every 6 (six) hours as needed for mild pain or headache.    [provider]  amiodarone (PACERONE) 200 MG tablet Take 100 mg by mouth every Monday, Wednesday, and Friday.  02/22/15   [provider]  amLODipine (NORVASC) 5 MG tablet Take 1 tablet (5 mg total) by mouth daily. 07/06/16   Micheline Chapman, NP  aspirin 81 MG chewable tablet Chew 81 mg by mouth daily.    [provider]  atorvastatin (LIPITOR) 40 MG tablet TAKE 1 TABLET( 40 MG TOTAL) BY MOUTH EVERY EVENING Patient taking differently: Take 40 mg by mouth daily at 6 PM. TAKE 1 TABLET( 40 MG TOTAL) BY MOUTH EVERY EVENING 07/26/16   Micheline Chapman, NP  finasteride (PROSCAR) 5 MG tablet Take 5 mg by mouth daily.  10/04/14   [provider]  furosemide (LASIX) 40 MG tablet Take 40 mg by mouth daily.  03/16/17   [provider]  glimepiride (AMARYL) 4 MG tablet  Take 4 mg by mouth daily with breakfast.  03/22/17   [provider]  latanoprost (XALATAN) 0.005 % ophthalmic solution Place 1 drop into both eyes at bedtime.    [provider]  metFORMIN (GLUCOPHAGE) 500 MG tablet Take 1 tablet (500 mg total) by mouth 3 (three) times daily. Patient taking differently: Take 500-1,000 mg by mouth 2 (two) times daily. 1000mg  in the Morning. 500mg  in the afternoon. 03/19/15   Micheline Chapman, NP  nitroGLYCERIN (NITROSTAT) 0.4 MG SL tablet Place 0.4 mg under the tongue every 5 (five) minutes as needed. For chest pain    [provider]    oxyCODONE (OXY IR/ROXICODONE) 5 MG immediate release tablet Take 1 tablet (5 mg total) by mouth 2 (two) times daily as needed for severe pain or breakthrough pain. 09/18/18   Kayleen Memos, DO  polyethylene glycol (MIRALAX / GLYCOLAX) packet Take 17 g by mouth daily. 09/19/18   Kayleen Memos, DO  rivaroxaban (XARELTO) 20 MG TABS tablet Take 1 tablet (20 mg total) by mouth daily with supper. 12/18/14   [name], FNP  senna-docusate (SENOKOT-S) 8.6-50 MG tablet Take 2 tablets by mouth 2 (two) times daily. 09/18/18   Kayleen Memos, DO     Family History  Problem Relation Age of Onset  . Lung cancer Father   . Asthma Sister   . Breast cancer Sister   . Colon cancer Neg Hx   . Rectal cancer Neg Hx   . Stomach cancer Neg Hx     Social History   Socioeconomic History  . Marital status: Married    Spouse name: Not on file  . Number of children: 1  . Years of education: Not on file  . Highest education level: Not on file  Occupational History  . Occupation: retired  Scientific laboratory technician  . Financial resource strain: Not on file  . Food insecurity:    Worry: Not on file    Inability: Not on file  . Transportation needs:    Medical: Not on file    Non-medical: Not on file  Tobacco Use  . Smoking status: Former Smoker    Packs/day: 2.00    Years: 30.00    Pack years: 60.00    Types: Cigarettes    Last attempt to quit: 04/13/2011    Years since quitting: 7.4  . Smokeless tobacco: Never Used  Substance and Sexual Activity  . Alcohol use: Not Currently  . Drug use: No  . Sexual activity: Yes  Lifestyle  . Physical activity:    Days per week: Not on file    Minutes per session: Not on file  . Stress: Not on file  Relationships  . Social connections:    Talks on phone: Not on file    Gets together: Not on file    Attends religious service: Not on file    Active member of club or organization: Not on file    Attends meetings of clubs or organizations: Not on file     Relationship status: Not on file  Other Topics Concern  . Not on file  Social History Narrative  . Not on file    Review of Systems: A 12 point ROS discussed and pertinent positives are indicated in the HPI above.  All other systems are negative.  Review of Systems  Constitutional: Negative for appetite change, chills and fever.  Respiratory: Negative for cough and shortness of breath.   Cardiovascular: Negative for  chest pain.  Gastrointestinal: Positive for abdominal pain (intermittent - midline). Negative for blood in stool, constipation, diarrhea, nausea and vomiting.  Skin: Negative for color change.  Neurological: Negative for dizziness, syncope and headaches.  Psychiatric/Behavioral: Negative for confusion.    Vital Signs: BP (!) 151/88   Pulse 76   Temp 98.1 F (36.7 C)   SpO2 95%   Physical Exam Vitals signs reviewed.  Constitutional:      General: He is not in acute distress.    Appearance: He is obese. He is not ill-appearing.  HENT:     Head: Normocephalic.  Cardiovascular:     Rate and Rhythm: Normal rate.  Pulmonary:     Effort: Pulmonary effort is normal.  Abdominal:     Tenderness: There is no abdominal tenderness.     Comments: (+) cholecystostomy; insertion site clean, dry, intact, dressed appropriately. No purulent drainage or bleeding noted around insertion site or on dressing. No erythema, edema or pain on palpation. No leakage of contrast during injection.   Skin:    General: Skin is warm and dry.  Neurological:     Mental Status: He is alert and oriented to person, place, and time.  Psychiatric:        Mood and Affect: Mood normal.        Behavior: Behavior normal.        Thought Content: Thought content normal.        Judgment: Judgment normal.      Imaging: Dg Chest 1 View  Result Date: 09/13/2018 CLINICAL DATA:  Elevated white blood cell count today. EXAM: CHEST  1 VIEW COMPARISON:  Single-view of the chest 09/11/2018. PA and lateral  chest 10/25/2015. CT chest 10/25/2015. FINDINGS: The lungs are clear. Heart size is upper normal. No pneumothorax or pleural effusion. Aortic atherosclerosis is noted. No acute or focal bony abnormality. IMPRESSION: No acute disease. Atherosclerosis. Electronically Signed   By: Inge Rise M.D.   On: 09/13/2018 15:11   Ct Abdomen Pelvis W Contrast  Result Date: 09/11/2018 CLINICAL DATA:  Abdominal distention. EXAM: CT ABDOMEN AND PELVIS WITH CONTRAST TECHNIQUE: Multidetector CT imaging of the abdomen and pelvis was performed using the standard protocol following bolus administration of intravenous contrast. CONTRAST:  161mL ISOVUE-300 IOPAMIDOL (ISOVUE-300) INJECTION 61% COMPARISON:  CT scan dated 06/11/2015 FINDINGS: Lower chest: No acute abnormality. Aortic and coronary atherosclerosis. Slight scarring in the right middle lobe, stable. Hepatobiliary: Hepatic steatosis. Extensive cholelithiasis. Slight new thickening and edema of the gallbladder wall. No dilated bile ducts. Pancreas: Unremarkable. No pancreatic ductal dilatation or surrounding inflammatory changes. Spleen: Chronic calcified granulomas. Otherwise normal. Adrenals/Urinary Tract: Adrenal glands are normal. Multiple stable bilateral renal cysts. No hydronephrosis. Bladder is normal. Stomach/Bowel: There are multiple diverticula scattered throughout the colon. There are also numerous diverticula in the terminal ileum. Small bowel is otherwise normal. Stomach and appendix are normal. Vascular/Lymphatic: Aortic atherosclerosis. No enlarged abdominal or pelvic lymph nodes. Reproductive: Prostate is unremarkable. Other: Small right inguinal hernia containing only fat. Two tiny periumbilical hernias containing only fat. These are unchanged. No ascites. Musculoskeletal: No acute abnormality. Multilevel chronic degenerative disc disease in the lower lumbar spine. IMPRESSION: 1. Extensive cholelithiasis with new slight thickening and edema of the  gallbladder wall. 2. Extensive diverticulosis of the colon and of the terminal ileum. Aortic Atherosclerosis (ICD10-I70.0). Electronically Signed   By: Lorriane Shire M.D.   On: 09/11/2018 10:47   Ir Perc Cholecystostomy  Result Date: 09/15/2018 INDICATION: High risk surgical candidate, acute  calculus cholecystitis EXAM: Percutaneous cholecystostomy MEDICATIONS: 3.375 g Zosyn; The antibiotic was administered within an appropriate time frame prior to the initiation of the procedure. ANESTHESIA/SEDATION: Moderate (conscious) sedation was employed during this procedure. A total of Versed 2.0 mg and Fentanyl 50 mcg was administered intravenously. Moderate Sedation Time: 10 minutes. The patient's level of consciousness and vital signs were monitored continuously by radiology nursing throughout the procedure under my direct supervision. FLUOROSCOPY TIME:  Fluoroscopy Time: 0 minutes 18 seconds (25 mGy). COMPLICATIONS: None immediate. PROCEDURE: Informed written consent was obtained from the patient after a thorough discussion of the procedural risks, benefits and alternatives. All questions were addressed. Maximal Sterile Barrier Technique was utilized including caps, mask, sterile gowns, sterile gloves, sterile drape, hand hygiene and skin antiseptic. A timeout was performed prior to the initiation of the procedure. Previous imaging reviewed. Preliminary ultrasound performed. The gallbladder was localized in the right upper quadrant beneath the subcostal margin. Overlying skin marked. Under sterile conditions and local anesthesia, an 18 gauge 15 cm access needle was advanced percutaneously from a transhepatic approach into the gallbladder. Needle position confirmed with ultrasound. There was return of exudative bile. Sample sent for culture. Guidewire inserted followed by tract dilatation to insert a pigtail drain. Drain catheter position confirmed with fluoroscopy. Images obtained for documentation. 100 cc bile  removed by syringe aspiration. Catheter connected to external gravity drainage bag. Catheter secured with Prolene suture and a sterile dressing. No immediate complication. Patient tolerated the procedure well. IMPRESSION: Successful ultrasound fluoroscopic percutaneous transhepatic cholecystostomy Electronically Signed   By: Jerilynn Mages.  Shick M.D.   On: 09/15/2018 15:53   Dg Abdomen Acute W/chest  Result Date: 09/11/2018 CLINICAL DATA:  Abdominal pain and distension beginning today. EXAM: DG ABDOMEN ACUTE W/ 1V CHEST COMPARISON:  10/25/2015 FINDINGS: One-view chest is unremarkable without evidence of infiltrate, collapse or effusion. No free air seen under the diaphragm. Abdominal radiographs show a moderate amount of fecal material throughout the colon. No sign of small bowel pathology. No acute calcifications or bone findings. There is chronic arterial calcification and chronic lumbar degenerative change. IMPRESSION: Negative acute abdominal series. Moderate amount of fecal material throughout the colon. Electronically Signed   By: Nelson Chimes M.D.   On: 09/11/2018 07:10   Ir Radiologist Eval & Mgmt  Result Date: 10/10/2018 Please refer to notes tab for details about interventional procedure. (Op Note)  US Abdomen Limited Ruq  Result Date: 09/13/2018 CLINICAL DATA:  Right upper quadrant pain EXAM: ULTRASOUND ABDOMEN LIMITED RIGHT UPPER QUADRANT COMPARISON:  CT 09/11/2018 FINDINGS: Gallbladder: Multiple gallstones measuring up to 1.9 cm. Gallbladder wall thickening 5.5 mm. Positive sonographic Murphy sign. Mild pericholecystic fluid. Common bile duct: Diameter: Not adequately visualized for measurement. No biliary dilatation on recent CT Liver: Liver is extremely echogenic and difficult to evaluate by ultrasound. Fatty liver on CT. No focal lesion in the liver. Portal vein is patent on color Doppler imaging with normal direction of blood flow towards the liver. IMPRESSION: Cholelithiasis. Findings suggestive of  acute cholecystitis with gallbladder wall thickening, positive sonographic Murphy sign, and pericholecystic fluid. Very echogenic liver compatible with fatty infiltration. Electronically Signed   By: Franchot Gallo M.D.   On: 09/13/2018 16:01    Labs:  CBC: Recent Labs    09/13/18 1438 09/13/18 1445 09/14/18 0626 09/15/18 0718 09/16/18 0646  WBC 21.2*  --  18.3* 18.2* 12.1*  HGB 12.7* 12.9* 11.4* 10.5* 9.7*  HCT 35.9* 38.0* 31.8* 29.4* 27.0*  PLT 121*  --  97*  120* 152    COAGS: Recent Labs    09/15/18 1109  INR 1.29    BMP: Recent Labs    09/14/18 0626 09/15/18 0718 09/16/18 0646 09/17/18 0537  NA 131* 134* 136 137  K 3.7 3.6 3.6 3.6  CL 100 104 103 104  CO2 20* 20* 23 24  GLUCOSE 186* 169* 151* 157*  BUN 37* 29* 26* 21  CALCIUM 8.0* 8.3* 8.3* 8.6*  CREATININE 2.04* 1.57* 1.40* 1.16  GFRNONAA 32* 44* 50* >60  GFRAA 37* 51* 58* >60    LIVER FUNCTION TESTS: Recent Labs    09/11/18 0622 09/13/18 1438 09/14/18 0626  BILITOT 0.7 1.4* 1.5*  AST 29 31 19   ALT 35 50* 37  ALKPHOS 38 46 45  PROT 8.1 7.7 7.1  ALBUMIN 4.6 3.8 3.7    TUMOR MARKERS: No results for input(s): AFPTM, CEA, CA199, CHROMGRNA in the last 8760 hours.  Assessment:  Patient s/p percutaneous cholecystostomy placement 09/15/18 by Dr. Annamaria Boots due to acute calculus cholecystitis and the patient not being an operative candidate at the time of presentation. Per drain card OP highly variable with recordings between 0 - 140 mL, average appears to be ~15 mL QD. He reports flushing daily as instructed without issue. No change in appearance of OP. He denies any symptoms similar to what brought him to the hospital.   Cholangiogram performed today and reviewed by Dr. Pascal Lux. Patient was also discussed with Dr. Harlow Asa by Dr. Pascal Lux following cholangiogram - patient has been deemed a surgical candidate although given his wife's unfortunate health issues it is uncertain when he will be able to undergo surgical  intervention. He will follow up with Dr. Gala Lewandowsky office regarding surgery timing.  Cholecystostomy was capped today, patient was sent home with a new gravity bag and instructions to reattach his drain to the gravity bag if he began to experience symptoms such as fever, chills, abdominal pain or leakage of fluid from the catheter site. Plan for cholecystostomy to be removed during surgical intervention with Dr. Harlow Asa - however if this is to be delayed for a significant amount of time we will see him again in IR clinic.   Patient encouraged to maintain follow up with all providers and to call IR clinic with any questions or concerns.    Electronically Signed: Joaquim Nam PA-C 10/10/2018, 1:44 PM   Please refer to Dr. Pascal Lux attestation of this note for management and plan.

## 2018-10-11 DIAGNOSIS — I2510 Atherosclerotic heart disease of native coronary artery without angina pectoris: Secondary | ICD-10-CM | POA: Diagnosis not present

## 2018-10-11 DIAGNOSIS — I5032 Chronic diastolic (congestive) heart failure: Secondary | ICD-10-CM | POA: Diagnosis not present

## 2018-10-11 DIAGNOSIS — E1165 Type 2 diabetes mellitus with hyperglycemia: Secondary | ICD-10-CM | POA: Diagnosis not present

## 2018-10-11 DIAGNOSIS — I110 Hypertensive heart disease with heart failure: Secondary | ICD-10-CM | POA: Diagnosis not present

## 2018-10-11 DIAGNOSIS — K810 Acute cholecystitis: Secondary | ICD-10-CM | POA: Diagnosis not present

## 2018-10-11 DIAGNOSIS — J449 Chronic obstructive pulmonary disease, unspecified: Secondary | ICD-10-CM | POA: Diagnosis not present

## 2018-10-15 ENCOUNTER — Encounter: Payer: Self-pay | Admitting: Surgery

## 2018-10-15 NOTE — H&P (Signed)
General Surgery [facility]  Matthew Ochoa DOB: 12/17/1945 Married / Language: English / Race: Black or African American Male   History of Present Illness   The patient is a 73 year old male who presents for evaluation of gall stones.  CHIEF COMPLAINT: cholecystitis, cholelithiasis  Patient returns for surgical follow-up having been seen in consultation during his hospitalization in early January. Patient presented with a one-week history of abdominal pain and symptoms related to acute cholecystitis and cholelithiasis. Percutaneous drainage was employed. The patient has dramatically improved and presents today for evaluation and further recommendations. He is feeling well and is anxious to get back to his normal activities. Patient is scheduled to be seen by interventional radiology on February 4 for catheter injection and cholangiography.   Problem List/Past Medical CHOLECYSTITIS, ACUTE WITH CHOLELITHIASIS (K80.00)  CHOLECYSTOSTOMY CARE (Z43.4)   Past Surgical History Colon Polyp Removal - Colonoscopy   Allergies No Known Allergies [10/04/2018]: No Known Drug Allergies [10/04/2018]: Allergies Reconciled   Medication History Amiodarone HCl (200MG  Tablet, Oral) Active. amLODIPine Besylate (5MG  Tablet, Oral) Active. Atorvastatin Calcium (40MG  Tablet, Oral) Active. Cephalexin (500MG  Capsule, Oral) Active. Glimepiride (4MG  Tablet, Oral) Active. Latanoprost (0.005% Solution, Ophthalmic) Active. Losartan Potassium (100MG  Tablet, Oral) Active. Furosemide (40MG  Tablet, Oral) Active. metFORMIN HCl (500MG  Tablet, Oral) Active. metroNIDAZOLE (500MG  Tablet, Oral) Active. Xarelto (20MG  Tablet, Oral) Active. Medications Reconciled  Social History  Alcohol use  Moderate alcohol use. No drug use  Tobacco use  Former smoker.  Family History Hypertension  Father. Respiratory Condition  Father.  Other Problems Back Pain  Bladder  Problems  Enlarged Prostate  High blood pressure  Hypercholesterolemia  Sleep Apnea   Review of Systems  Breast Not Present- Breast Mass, Breast Pain, Nipple Discharge and Skin Changes. Male Genitourinary Not Present- Blood in Urine, Change in Urinary Stream, Frequency, Impotence, Nocturia, Painful Urination, Urgency and Urine Leakage.  Vitals Weight: 277.5 lb Height: 70in Body Surface Area: 2.4 m Body Mass Index: 39.82 kg/m  Temp.: 97.40F(Temporal)  Pulse: 81 (Regular)  P.OX: 90% (Room air) BP: 142/78 (Sitting, Left Arm, Standard)  Physical Exam  See vital signs recorded above  GENERAL APPEARANCE Development: normal Nutritional status: normal Gross deformities: none  SKIN Rash, lesions, ulcers: none Induration, erythema: none Nodules: none palpable  EYES Conjunctiva and lids: normal Pupils: equal and reactive Iris: normal bilaterally  EARS, NOSE, MOUTH, THROAT External ears: no lesion or deformity External nose: no lesion or deformity Hearing: grossly normal Lips: no lesion or deformity Dentition: normal for age Oral mucosa: moist  NECK Symmetric: yes Trachea: midline Thyroid: no palpable nodules in the thyroid bed  CHEST Respiratory effort: normal Retraction or accessory muscle use: no Breath sounds: normal bilaterally Rales, rhonchi, wheeze: none  CARDIOVASCULAR Auscultation: regular rhythm, normal rate Murmurs: none Pulses: carotid and radial pulse 2+ palpable Lower extremity edema: none Lower extremity varicosities: none  ABDOMEN Distension: none Masses: none palpable Tenderness: Mild tenderness to deep palpation right upper quadrant, no palpable mass Hepatosplenomegaly: not present Hernia: not present Percutaneous drain right lateral abdominal wall with thin normal appearing bilious fluid in the collection bag  MUSCULOSKELETAL Station and gait: normal Digits and nails: no clubbing or cyanosis Muscle strength: grossly  normal all extremities Range of motion: grossly normal all extremities Deformity: none  LYMPHATIC Cervical: none palpable Supraclavicular: none palpable  PSYCHIATRIC Oriented to person, place, and time: yes Mood and affect: normal for situation Judgment and insight: appropriate for situation    Assessment & Plan  CHOLECYSTITIS, ACUTE WITH CHOLELITHIASIS (K80.00)  Pt Education - Pamphlet Given - Laparoscopic Gallbladder Surgery: discussed with patient and provided information.  CHOLECYSTOSTOMY CARE (Z43.4)  Patient has recovered nicely since his hospitalization earlier this month with acute cholecystitis and cholelithiasis. Cholecystostomy percutaneous drain remains in place. There is normal-appearing bile in the drainage bag.  Patient is scheduled for cholecystostomy tube injection by interventional radiology on February 4. We will await the results of that study before planning further operative interventions. Hopefully, the patient will be able to proceed with laparoscopic cholecystectomy in approximately 2-3 weeks as a scheduled procedure. We will contact the patient with the results of his cholecystostomy tube injection when they are available next week.  Patient is provided with literature on gallbladder surgery. We discussed laparoscopic cholecystectomy today. We discussed the potential for conversion to open surgery. We discussed his hospital stay and postoperative recovery and return to normal activities. Patient would like to proceed in the near future.   Armandina Gemma, [place] [facility] Office: [phone]

## 2018-10-17 NOTE — Progress Notes (Signed)
EKG, CXR, HGBA1C 09-13-2018 Epic   LOV PULMONOLOGY 01-20-18 Epic   LOV CARDS 2012 EPIC

## 2018-10-17 NOTE — Patient Instructions (Addendum)
Matthew Ochoa.  10/17/2018   Your procedure is scheduled on: 10-20-2018    Report to [facility] Main  Entrance     Report to admitting at 9:30AM    Call this number if you have problems the morning of surgery [phone]    PLEASE BRING CPAP MASK AND  TUBING ONLY. DEVICE WILL BE PROVIDED!   Remember: Do not eat food or drink liquids :After Midnight. BRUSH YOUR TEETH MORNING OF SURGERY AND RINSE YOUR MOUTH OUT, NO CHEWING GUM CANDY OR MINTS.     Take these medicines the morning of surgery with A SIP OF WATER:  AMIODARONE, AMLODIPINE, FINASTERIDE   DO NOT TAKE ANY DIABETIC MEDICATIONS DAY OF YOUR SURGERY YOU MUST CHECK YOUR BLOOD SUGAR THE MORNING OF SURGERY. REPORT TO YOUR NURSE ON ARRIVAL                You may not have any metal on your body including hair pins and              piercings  Do not wear jewelry, make-up, lotions, powders or perfumes, deodorant                Men may shave face and neck.   Do not bring valuables to the hospital. [place].  Contacts, dentures or bridgework may not be worn into surgery.  Leave suitcase in the car. After surgery it may be brought to your room.                  Please read over the following fact sheets you were given: _____________________________________________________________________             [facility] - Preparing for Surgery Before surgery, you can play an important role.  Because skin is not sterile, your skin needs to be as free of germs as possible.  You can reduce the number of germs on your skin by washing with CHG (chlorahexidine gluconate) soap before surgery.  CHG is an antiseptic cleaner which kills germs and bonds with the skin to continue killing germs even after washing. Please DO NOT use if you have an allergy to CHG or antibacterial soaps.  If your skin becomes reddened/irritated stop using the CHG and inform your nurse when  you arrive at Short Stay. Do not shave (including legs and underarms) for at least 48 hours prior to the first CHG shower.  You may shave your face/neck. Please follow these instructions carefully:  1.  Shower with CHG Soap the night before surgery and the  morning of Surgery.  2.  If you choose to wash your hair, wash your hair first as usual with your  normal  shampoo.  3.  After you shampoo, rinse your hair and body thoroughly to remove the  shampoo.                           4.  Use CHG as you would any other liquid soap.  You can apply chg directly  to the skin and wash                       Gently with a scrungie or clean washcloth.  5.  Apply the CHG  Soap to your body ONLY FROM THE NECK DOWN.   Do not use on face/ open                           Wound or open sores. Avoid contact with eyes, ears mouth and genitals (private parts).                       Wash face,  Genitals (private parts) with your normal soap.             6.  Wash thoroughly, paying special attention to the area where your surgery  will be performed.  7.  Thoroughly rinse your body with warm water from the neck down.  8.  DO NOT shower/wash with your normal soap after using and rinsing off  the CHG Soap.                9.  Pat yourself dry with a clean towel.            10.  Wear clean pajamas.            11.  Place clean sheets on your bed the night of your first shower and do not  sleep with pets. Day of Surgery : Do not apply any lotions/deodorants the morning of surgery.  Please wear clean clothes to the hospital/surgery center.  FAILURE TO FOLLOW THESE INSTRUCTIONS MAY RESULT IN THE CANCELLATION OF YOUR SURGERY PATIENT SIGNATURE_________________________________  NURSE SIGNATURE__________________________________  ________________________________________________________________________

## 2018-10-18 ENCOUNTER — Other Ambulatory Visit: Payer: Self-pay

## 2018-10-18 ENCOUNTER — Encounter: Payer: Medicare Other | Attending: Internal Medicine | Admitting: *Deleted

## 2018-10-18 ENCOUNTER — Encounter (HOSPITAL_COMMUNITY)
Admission: RE | Admit: 2018-10-18 | Discharge: 2018-10-18 | Disposition: A | Discharge status: Home / Self Care | Payer: Medicare Other | Source: Ambulatory Visit | Attending: Surgery | Admitting: Surgery

## 2018-10-18 ENCOUNTER — Encounter (HOSPITAL_COMMUNITY): Payer: Self-pay

## 2018-10-18 DIAGNOSIS — K811 Chronic cholecystitis: Secondary | ICD-10-CM | POA: Insufficient documentation

## 2018-10-18 DIAGNOSIS — Z01818 Encounter for other preprocedural examination: Secondary | ICD-10-CM | POA: Diagnosis not present

## 2018-10-18 DIAGNOSIS — E118 Type 2 diabetes mellitus with unspecified complications: Secondary | ICD-10-CM | POA: Diagnosis not present

## 2018-10-18 DIAGNOSIS — Z9049 Acquired absence of other specified parts of digestive tract: Secondary | ICD-10-CM | POA: Insufficient documentation

## 2018-10-18 DIAGNOSIS — E119 Type 2 diabetes mellitus without complications: Secondary | ICD-10-CM | POA: Diagnosis not present

## 2018-10-18 HISTORY — DX: Heart failure, unspecified: I50.9

## 2018-10-18 LAB — BASIC METABOLIC PANEL
Anion gap: 10 (ref 5–15)
BUN: 12 mg/dL (ref 8–23)
CO2: 25 mmol/L (ref 22–32)
Calcium: 9.4 mg/dL (ref 8.9–10.3)
Chloride: 104 mmol/L (ref 98–111)
Creatinine, Ser: 1.14 mg/dL (ref 0.61–1.24)
GFR calc Af Amer: >60 mL/min (ref >60)
GFR calc non Af Amer: >60 mL/min (ref >60)
Glucose, Bld: 109 mg/dL — ABNORMAL HIGH (ref 70–99)
Potassium: 3.2 mmol/L — ABNORMAL LOW (ref 3.5–5.1)
Sodium: 139 mmol/L (ref 135–145)

## 2018-10-18 LAB — CBC
HCT: 35.4 % — ABNORMAL LOW (ref 39.0–52.0)
Hemoglobin: 12.2 g/dL — ABNORMAL LOW (ref 13.0–17.0)
MCH: 28.1 pg (ref 26.0–34.0)
MCHC: 34.5 g/dL (ref 30.0–36.0)
MCV: 81.6 fL (ref 80.0–100.0)
Platelets: 181 10*3/uL (ref 150–400)
RBC: 4.34 MIL/uL (ref 4.22–5.81)
RDW: 17.4 % — ABNORMAL HIGH (ref 11.5–15.5)
WBC: 7.5 10*3/uL (ref 4.0–10.5)
nRBC: 0 % (ref 0.0–0.2)

## 2018-10-18 LAB — GLUCOSE, CAPILLARY: Glucose-Capillary: 110 mg/dL — ABNORMAL HIGH (ref 70–99)

## 2018-10-18 NOTE — Progress Notes (Signed)
Diabetes Self-Management Education  Visit Type: First/Initial  Appt. Start Time: 0800 Appt. End Time: 0930  10/18/2018  [name], identified by name and date of birth, is a 73 y.o. male with a diagnosis of Diabetes: Type 2. Patient states history of Diabetes for past 10 years or more. He is having gall bladder surgery this Friday and has questions as to whether to take his diabetes medications that morning since he will be NPO until 11 AM. He has a meter but is not testing currently stating he was not asked to by his MD. Current A1c is 7.5%  ASSESSMENT  There were no vitals taken for this visit. There is no height or weight on file to calculate BMI.  Diabetes Self-Management Education - 10/18/18 7673      Visit Information   Visit Type  First/Initial      Initial Visit   Diabetes Type  Type 2    Are you currently following a meal plan?  No    Are you taking your medications as prescribed?  Yes    Date Diagnosed  10 years ago      Health Coping   How would you rate your overall health?  Good      Psychosocial Assessment   Patient Belief/Attitude about Diabetes  Denial    Self-care barriers  None    Other persons present  Patient    Patient Concerns  Nutrition/Meal planning    Special Needs  None    Preferred Learning Style  No preference indicated    Learning Readiness  Contemplating    How often do you need to have someone help you when you read instructions, pamphlets, or other written materials from your doctor or pharmacy?  1 - Never    What is the last grade level you completed in school?  14      Pre-Education Assessment   Patient understands the diabetes disease and treatment process.  Needs Instruction    Patient understands incorporating nutritional management into lifestyle.  Needs Instruction    Patient undertands incorporating physical activity into lifestyle.  Needs Instruction    Patient understands using medications safely.  Needs Review    Patient  understands monitoring blood glucose, interpreting and using results  Needs Instruction    Patient understands prevention, detection, and treatment of acute complications.  Needs Review    Patient understands prevention, detection, and treatment of chronic complications.  Needs Review    Patient understands how to develop strategies to address psychosocial issues.  Needs Instruction    Patient understands how to develop strategies to promote health/change behavior.  Needs Instruction      Complications   Last HgB A1C per patient/outside source  7.5 %    How often do you check your blood sugar?  0 times/day (not testing)    Have you had a dilated eye exam in the past 12 months?  Yes    Have you had a dental exam in the past 12 months?  No    Are you checking your feet?  No      Dietary Intake   Breakfast  skips twice a week OR bacon and eggs OR flavored oatmeal x 2 OR boiled egg and 2 pieces toast    Snack (morning)  PNB crackers OR whatever is in the kitchen OR hot dog if playing golf    Lunch  sandwich with chips    Snack (afternoon)  same as morning  Dinner  meat, starch, vegetables, bread, occasionally salad    Snack (evening)  cookies and ice cream    Beverage(s)  OJ, juice, water, beer infrequently (twice a month),       Exercise   Exercise Type  Light (walking / raking leaves)   yard work, golf, house chores   How many days per week to you exercise?  4    How many minutes per day do you exercise?  30    Total minutes per week of exercise  120      Patient Education   Previous Diabetes Education  Yes (please comment)    Disease state   Definition of diabetes, type 1 and 2, and the diagnosis of diabetes;Factors that contribute to the development of diabetes    Nutrition management   Role of diet in the treatment of diabetes and the relationship between the three main macronutrients and blood glucose level;Food label reading, portion sizes and measuring food.;Carbohydrate  counting;Reviewed blood glucose goals for pre and post meals and how to evaluate the patients' food intake on their blood glucose level.    Physical activity and exercise   Role of exercise on diabetes management, blood pressure control and cardiac health.;Helped patient identify appropriate exercises in relation to his/her diabetes, diabetes complications and other health issue.    Medications  Reviewed patients medication for diabetes, action, purpose, timing of dose and side effects.    Monitoring  Purpose and frequency of SMBG.;Identified appropriate SMBG and/or A1C goals.    Chronic complications  Relationship between chronic complications and blood glucose control      Individualized Goals (developed by patient)   Nutrition  Follow meal plan discussed    Physical Activity  Exercise 3-5 times per week    Medications  take my medication as prescribed    Monitoring   test blood glucose pre and post meals as discussed      Post-Education Assessment   Patient understands the diabetes disease and treatment process.  Demonstrates understanding / competency    Patient understands incorporating nutritional management into lifestyle.  Demonstrates understanding / competency    Patient undertands incorporating physical activity into lifestyle.  Demonstrates understanding / competency    Patient understands using medications safely.  Demonstrates understanding / competency    Patient understands monitoring blood glucose, interpreting and using results  Demonstrates understanding / competency    Patient understands prevention, detection, and treatment of acute complications.  Demonstrates understanding / competency    Patient understands prevention, detection, and treatment of chronic complications.  Demonstrates understanding / competency    Patient understands how to develop strategies to address psychosocial issues.  Demonstrates understanding / competency    Patient understands how to develop  strategies to promote health/change behavior.  Demonstrates understanding / competency      Outcomes   Expected Outcomes  Demonstrated interest in learning. Expect positive outcomes    Future DMSE  PRN    Program Status  Completed       Individualized Plan for Diabetes Self-Management Training:   Learning Objective:  Patient will have a greater understanding of diabetes self-management. Patient education plan is to attend individual and/or group sessions per assessed needs and concerns.   Plan:   Patient Instructions  Plan:  Aim for 3 Carb Choices per meal (45 grams) +/- 1 either way  Aim for 0-2 Carbs per snack if hungry  Include protein in moderation with your meals and snacks Consider reading food labels for  Total Carbohydrate of foods Continue with your activity level by walking and yard work daily as tolerated Consider checking BG at alternate times per day   Consider taking medication as directed by MD  Expected Outcomes:  Demonstrated interest in learning. Expect positive outcomes  Education material provided: Food label handouts, A1C conversion sheet, Meal plan card and Carbohydrate counting sheet  If problems or questions, patient to contact team via:  Phone  Future DSME appointment: PRN

## 2018-10-18 NOTE — Patient Instructions (Signed)
Plan:  Aim for 3 Carb Choices per meal (45 grams) +/- 1 either way  Aim for 0-2 Carbs per snack if hungry  Include protein in moderation with your meals and snacks Consider reading food labels for Total Carbohydrate of foods Continue with your activity level by walking and yard work daily as tolerated Consider checking BG at alternate times per day   Consider taking medication as directed by MD

## 2018-10-19 ENCOUNTER — Encounter (HOSPITAL_COMMUNITY): Payer: Self-pay | Admitting: Physician Assistant

## 2018-10-19 DIAGNOSIS — I480 Paroxysmal atrial fibrillation: Secondary | ICD-10-CM | POA: Diagnosis not present

## 2018-10-19 DIAGNOSIS — Z7984 Long term (current) use of oral hypoglycemic drugs: Secondary | ICD-10-CM | POA: Diagnosis not present

## 2018-10-19 DIAGNOSIS — J449 Chronic obstructive pulmonary disease, unspecified: Secondary | ICD-10-CM | POA: Diagnosis not present

## 2018-10-19 DIAGNOSIS — I2510 Atherosclerotic heart disease of native coronary artery without angina pectoris: Secondary | ICD-10-CM | POA: Diagnosis not present

## 2018-10-19 DIAGNOSIS — K810 Acute cholecystitis: Secondary | ICD-10-CM | POA: Diagnosis not present

## 2018-10-19 DIAGNOSIS — Z7901 Long term (current) use of anticoagulants: Secondary | ICD-10-CM | POA: Diagnosis not present

## 2018-10-19 DIAGNOSIS — Z7982 Long term (current) use of aspirin: Secondary | ICD-10-CM | POA: Diagnosis not present

## 2018-10-19 DIAGNOSIS — I5032 Chronic diastolic (congestive) heart failure: Secondary | ICD-10-CM | POA: Diagnosis not present

## 2018-10-19 DIAGNOSIS — I110 Hypertensive heart disease with heart failure: Secondary | ICD-10-CM | POA: Diagnosis not present

## 2018-10-19 DIAGNOSIS — E1165 Type 2 diabetes mellitus with hyperglycemia: Secondary | ICD-10-CM | POA: Diagnosis not present

## 2018-10-19 MED ORDER — DEXTROSE 5 % IV SOLN
3.0000 g | INTRAVENOUS | Status: DC
  Filled 2018-10-19: qty 3000

## 2018-10-19 NOTE — Progress Notes (Signed)
Anesthesia Chart Review   Case:  [id number] Date/Time:  10/20/18 1115   Procedure:  LAPAROSCOPIC CHOLECYSTECTOMY WITH INTRAOPERATIVE CHOLANGIOGRAM (N/A )   Anesthesia type:  General   Pre-op diagnosis:  chronic cholecystitis  cholelithiasis, percutaneous cholecystostomy tube in place   Location:  [facility] / [facility]   Surgeon:  Armandina Gemma, MD      DISCUSSION: 73 yo former smoker (60 pack years, quit 04/13/11) with h/o HTN, BPH, DM II, DVT, PE, A-fib (on Xarelto), OSA w/CPAP, CHF, HLD, COPD, chronic cholecystitis with percutaneous cholecystostomy tube in place scheduled for above procedure 10/20/18 with Dr. Armandina Gemma.   Pt with recent hospitalization 1/8-1/13/20 due to acute cholecystitis.  Perc chole placed 09/15/18 by IR.  Cardiology consulted during hospital admission.  Her consult note 09/14/18 Dr. Terrence Dupont states, "Acceptable risk for laparoscopic cholecystectomy from cardiac point of view.  Agree with present management.  Monitor renal function closely."  Pt has been off Xarelto since 10/15/2018. Pt remains asymptomatic.   EKG at PST with new T wave changes concerning for ischemia when compared to EKG dated 09/14/18.  Discussed with Dr. Glennon Mac.  Due to new findings will need cardiac input before proceeding.  Discussed with Dr. Harlow Asa MA.  VS: BP 135/78 (BP Location: Left Arm)   Pulse 60   Temp 36.9 C (Oral)   Resp 18   Ht 5\' 11"  (1.803 m)   Wt 123.1 kg   SpO2 99%   BMI 37.84 kg/m   PROVIDERS: Seward Carol, MD is PCP   Baird Lyons, MD is Pulmonologist last seen 01/21/19 LABS: Labs reviewed: Acceptable for surgery. (all labs ordered are listed, but only abnormal results are displayed)  Labs Reviewed  GLUCOSE, CAPILLARY - Abnormal; Notable for the following components:      Result Value   Glucose-Capillary 110 (*)    All other components within normal limits  CBC - Abnormal; Notable for the following components:   Hemoglobin 12.2 (*)    HCT 35.4 (*)    RDW 17.4 (*)    All  other components within normal limits  BASIC METABOLIC PANEL - Abnormal; Notable for the following components:   Potassium 3.2 (*)    Glucose, Bld 109 (*)    All other components within normal limits     IMAGES: Chest Xray 09/13/2018  FINDINGS: The lungs are clear. Heart size is upper normal. No pneumothorax or pleural effusion. Aortic atherosclerosis is noted. No acute or focal bony abnormality.  IMPRESSION: No acute disease.  EKG: 10/18/2018 Rate 54 bpm Sinus bradycardia Left axis deviation Minimal voltage criteria for LVH, may be normal variant Inferior infarct, age undetermined Possible Anterior infarct, age undetermined When compared to prior tracing, lateral t wave changes are new and concerning for ischemia   CV: Echo 07/05/2012 Study Conclusions  - Left ventricle: The cavity size was normal. Wall thickness was increased in a pattern of mild LVH. Systolic function was normal. The estimated ejection fraction was in the range of 50% to 55%. Regional wall motion abnormalities cannot be excluded. Doppler parameters are consistent with abnormal left ventricular relaxation (grade 1 diastolic dysfunction). - Aortic valve: Trivial regurgitation. - Left atrium: The atrium was moderately dilated. - Atrial septum: No defect or patent foramen ovale was identified.  Past Medical History:  Diagnosis Date  . Abdominal pain, right upper quadrant   . Allergic rhinitis   . Asthma    as child  . Atrial enlargement, left y-2  . Benign localized hyperplasia  of prostate with urinary obstruction and other lower urinary tract symptoms (LUTS)(600.21)   . Calculus of gallbladder without mention of cholecystitis or obstruction   . CHF (congestive heart failure) ([place])    managed by Dr Terrence Dupont   . COPD (chronic obstructive pulmonary disease) ([place])   . Diabetes mellitus   . DVT (deep venous thrombosis) ([place])   . Hematuria, unspecified   . HTN (hypertension)   .  Hyperlipidemia   . Hypertension   . Pulmonary embolism ([place])    on xarelto   . Sleep apnea    NPSG 03-17-98, cpap 14  . Type 2 diabetes mellitus ([place])     Past Surgical History:  Procedure Laterality Date  . ANGIOPLASTY  2011 or 2012   stent  . COLONOSCOPY    . CORONARY ANGIOPLASTY WITH STENT PLACEMENT    . IR PERC CHOLECYSTOSTOMY  09/15/2018  . IR RADIOLOGIST EVAL & MGMT  10/10/2018  . POLYPECTOMY      MEDICATIONS: . acetaminophen (TYLENOL) 325 MG tablet  . amiodarone (PACERONE) 200 MG tablet  . amLODipine (NORVASC) 5 MG tablet  . aspirin 81 MG tablet  . atorvastatin (LIPITOR) 40 MG tablet  . finasteride (PROSCAR) 5 MG tablet  . furosemide (LASIX) 40 MG tablet  . glimepiride (AMARYL) 4 MG tablet  . latanoprost (XALATAN) 0.005 % ophthalmic solution  . metFORMIN (GLUCOPHAGE) 500 MG tablet  . nitroGLYCERIN (NITROSTAT) 0.4 MG SL tablet  . oxyCODONE (OXY IR/ROXICODONE) 5 MG immediate release tablet  . polyethylene glycol (MIRALAX / GLYCOLAX) packet  . rivaroxaban (XARELTO) 20 MG TABS tablet  . senna-docusate (SENOKOT-S) 8.6-50 MG tablet   No current facility-administered medications for this encounter.     [name] [facility] [place] Pre-Surgical Testing [phone] 10/19/18 11:56 AM

## 2018-10-20 ENCOUNTER — Encounter (HOSPITAL_COMMUNITY): Admission: RE | Payer: Self-pay | Source: Home / Self Care

## 2018-10-20 ENCOUNTER — Ambulatory Visit (HOSPITAL_COMMUNITY): Admission: RE | Admit: 2018-10-20 | Payer: Medicare Other | Source: Home / Self Care | Admitting: Surgery

## 2018-10-20 DIAGNOSIS — E785 Hyperlipidemia, unspecified: Secondary | ICD-10-CM | POA: Diagnosis not present

## 2018-10-20 DIAGNOSIS — G4733 Obstructive sleep apnea (adult) (pediatric): Secondary | ICD-10-CM | POA: Diagnosis not present

## 2018-10-20 DIAGNOSIS — E119 Type 2 diabetes mellitus without complications: Secondary | ICD-10-CM | POA: Diagnosis not present

## 2018-10-20 DIAGNOSIS — I10 Essential (primary) hypertension: Secondary | ICD-10-CM | POA: Diagnosis not present

## 2018-10-20 DIAGNOSIS — Z01810 Encounter for preprocedural cardiovascular examination: Secondary | ICD-10-CM | POA: Diagnosis not present

## 2018-10-20 DIAGNOSIS — M1990 Unspecified osteoarthritis, unspecified site: Secondary | ICD-10-CM | POA: Diagnosis not present

## 2018-10-20 DIAGNOSIS — I2510 Atherosclerotic heart disease of native coronary artery without angina pectoris: Secondary | ICD-10-CM | POA: Diagnosis not present

## 2018-10-20 DIAGNOSIS — Z86711 Personal history of pulmonary embolism: Secondary | ICD-10-CM | POA: Diagnosis not present

## 2018-10-20 SURGERY — LAPAROSCOPIC CHOLECYSTECTOMY WITH INTRAOPERATIVE CHOLANGIOGRAM
Anesthesia: General

## 2018-11-02 DIAGNOSIS — J449 Chronic obstructive pulmonary disease, unspecified: Secondary | ICD-10-CM | POA: Diagnosis not present

## 2018-11-02 DIAGNOSIS — E1165 Type 2 diabetes mellitus with hyperglycemia: Secondary | ICD-10-CM | POA: Diagnosis not present

## 2018-11-02 DIAGNOSIS — E119 Type 2 diabetes mellitus without complications: Secondary | ICD-10-CM | POA: Diagnosis not present

## 2018-11-02 DIAGNOSIS — I2510 Atherosclerotic heart disease of native coronary artery without angina pectoris: Secondary | ICD-10-CM | POA: Diagnosis not present

## 2018-11-02 DIAGNOSIS — K810 Acute cholecystitis: Secondary | ICD-10-CM | POA: Diagnosis not present

## 2018-11-02 DIAGNOSIS — H04123 Dry eye syndrome of bilateral lacrimal glands: Secondary | ICD-10-CM | POA: Diagnosis not present

## 2018-11-02 DIAGNOSIS — H2513 Age-related nuclear cataract, bilateral: Secondary | ICD-10-CM | POA: Diagnosis not present

## 2018-11-02 DIAGNOSIS — I5032 Chronic diastolic (congestive) heart failure: Secondary | ICD-10-CM | POA: Diagnosis not present

## 2018-11-02 DIAGNOSIS — H40023 Open angle with borderline findings, high risk, bilateral: Secondary | ICD-10-CM | POA: Diagnosis not present

## 2018-11-02 DIAGNOSIS — I110 Hypertensive heart disease with heart failure: Secondary | ICD-10-CM | POA: Diagnosis not present

## 2018-11-04 ENCOUNTER — Encounter: Payer: Self-pay | Admitting: Gastroenterology

## 2018-11-13 ENCOUNTER — Ambulatory Visit: Payer: Self-pay | Admitting: Surgery

## 2018-11-17 DIAGNOSIS — T148XXA Other injury of unspecified body region, initial encounter: Secondary | ICD-10-CM | POA: Diagnosis not present

## 2018-11-17 DIAGNOSIS — Z634 Disappearance and death of family member: Secondary | ICD-10-CM | POA: Diagnosis not present

## 2018-11-23 DIAGNOSIS — T148XXA Other injury of unspecified body region, initial encounter: Secondary | ICD-10-CM | POA: Diagnosis not present

## 2018-11-27 DIAGNOSIS — F4321 Adjustment disorder with depressed mood: Secondary | ICD-10-CM | POA: Diagnosis not present

## 2018-11-27 DIAGNOSIS — I2699 Other pulmonary embolism without acute cor pulmonale: Secondary | ICD-10-CM | POA: Diagnosis not present

## 2018-11-27 DIAGNOSIS — T148XXA Other injury of unspecified body region, initial encounter: Secondary | ICD-10-CM | POA: Diagnosis not present

## 2018-11-28 NOTE — Progress Notes (Signed)
Pt called per pre-operative screening for COVID-19. Pt denies cough, fever>100.4, runny nose, sore throat, difficulty breathing/shortness of breath, domestic or international travel.

## 2018-11-28 NOTE — Patient Instructions (Addendum)
Matthew Flake Jr.  11/28/2018   Your procedure is scheduled on: 12-08-18    Report to [facility] Main  Entrance    Report to Admitting at 7:30 AM    Call this number if you have problems the morning of surgery [phone]    Remember: Do not eat food or drink liquids :After Midnight.    BRUSH YOUR TEETH MORNING OF SURGERY AND RINSE YOUR MOUTH OUT, NO CHEWING GUM CANDY OR MINTS.     Take these medicines the morning of surgery with A SIP OF WATER: Amiodarone (Pacerone), Amlodipine (Norvasc), and Finasteride (Proscar)    DO NOT TAKE ANY DIABETIC MEDICATIONS DAY OF YOUR SURGERY                               You may not have any metal on your body including hair pins and              piercings  Do not wear jewelry, cologne, lotions, powders or deodorant             Men may shave face and neck.   Do not bring valuables to the hospital. [place].  Contacts, dentures or bridgework may not be worn into surgery.  Leave suitcase in the car. After surgery it may be brought to your room.     Patients discharged the day of surgery will not be allowed to drive home. IF YOU ARE HAVING SURGERY AND GOING HOME THE SAME DAY, YOU MUST HAVE AN ADULT TO DRIVE YOU HOME AND BE WITH YOU FOR 24 HOURS. YOU MAY GO HOME BY TAXI OR UBER OR ORTHERWISE, BUT AN ADULT MUST ACCOMPANY YOU HOME AND STAY WITH YOU FOR 24 HOURS.    Special Instructions: Please bring your mask and tubing              Please read over the following fact sheets you were given: _____________________________________________________________________ How to Manage Your Diabetes Before and After Surgery  Why is it important to control my blood sugar before and after surgery? . Improving blood sugar levels before and after surgery helps healing and can limit problems. . A way of improving blood sugar control is eating a healthy diet by: o  Eating less sugar and  carbohydrates o  Increasing activity/exercise o  Talking with your doctor about reaching your blood sugar goals . High blood sugars (greater than 180 mg/dL) can raise your risk of infections and slow your recovery, so you will need to focus on controlling your diabetes during the weeks before surgery. . Make sure that the doctor who takes care of your diabetes knows about your planned surgery including the date and location.  How do I manage my blood sugar before surgery? . Check your blood sugar at least 4 times a day, starting 2 days before surgery, to make sure that the level is not too high or low. o Check your blood sugar the morning of your surgery when you wake up and every 2 hours until you get to the Short Stay unit. . If your blood sugar is less than 70 mg/dL, you will need to treat for low blood sugar: o Do not take insulin. o Treat a low blood sugar (  less than 70 mg/dL) with  cup of clear juice (cranberry or apple), 4 glucose tablets, OR glucose gel. o Recheck blood sugar in 15 minutes after treatment (to make sure it is greater than 70 mg/dL). If your blood sugar is not greater than 70 mg/dL on recheck, call [phone] for further instructions. . Report your blood sugar to the short stay nurse when you get to Short Stay.  . If you are admitted to the hospital after surgery: o Your blood sugar will be checked by the staff and you will probably be given insulin after surgery (instead of oral diabetes medicines) to make sure you have good blood sugar levels. o The goal for blood sugar control after surgery is 80-180 mg/dL.   WHAT DO I DO ABOUT MY DIABETES MEDICATION?  Marland Kitchen Do not take oral diabetes medicines (pills) the morning of surgery.  THE  BEFORE SURGERY, take only your morning/lunch dose of Glimepiride (Amaryl). Take your usual dose of Metformin         Reviewed and Endorsed by [facility] Patient Education Committee, August 2015           [facility] - Preparing for  Surgery Before surgery, you can play an important role.  Because skin is not sterile, your skin needs to be as free of germs as possible.  You can reduce the number of germs on your skin by washing with CHG (chlorahexidine gluconate) soap before surgery.  CHG is an antiseptic cleaner which kills germs and bonds with the skin to continue killing germs even after washing. Please DO NOT use if you have an allergy to CHG or antibacterial soaps.  If your skin becomes reddened/irritated stop using the CHG and inform your nurse when you arrive at Short Stay. Do not shave (including legs and underarms) for at least 48 hours prior to the first CHG shower.  You may shave your face/neck. Please follow these instructions carefully:  1.  Shower with CHG Soap the night before surgery and the  morning of Surgery.  2.  If you choose to wash your hair, wash your hair first as usual with your  normal  shampoo.  3.  After you shampoo, rinse your hair and body thoroughly to remove the  shampoo.                           4.  Use CHG as you would any other liquid soap.  You can apply chg directly  to the skin and wash                       Gently with a scrungie or clean washcloth.  5.  Apply the CHG Soap to your body ONLY FROM THE NECK DOWN.   Do not use on face/ open                           Wound or open sores. Avoid contact with eyes, ears mouth and genitals (private parts).                       Wash face,  Genitals (private parts) with your normal soap.             6.  Wash thoroughly, paying special attention to the area where your surgery  will be performed.  7.  Thoroughly rinse your body with warm  water from the neck down.  8.  DO NOT shower/wash with your normal soap after using and rinsing off  the CHG Soap.                9.  Pat yourself dry with a clean towel.            10.  Wear clean pajamas.            11.  Place clean sheets on your bed the night of your first shower and do not  sleep with pets. Day  of Surgery : Do not apply any lotions/deodorants the morning of surgery.  Please wear clean clothes to the hospital/surgery center.  FAILURE TO FOLLOW THESE INSTRUCTIONS MAY RESULT IN THE CANCELLATION OF YOUR SURGERY PATIENT SIGNATURE_________________________________  NURSE SIGNATURE__________________________________  ________________________________________________________________________

## 2018-11-28 NOTE — Progress Notes (Signed)
10-18-18 (Epic) EKG  09-13-18 (Epic) CXR

## 2018-11-29 ENCOUNTER — Other Ambulatory Visit: Payer: Self-pay

## 2018-11-29 ENCOUNTER — Encounter (HOSPITAL_COMMUNITY): Payer: Self-pay

## 2018-11-29 ENCOUNTER — Encounter (HOSPITAL_COMMUNITY)
Admission: RE | Admit: 2018-11-29 | Discharge: 2018-11-29 | Disposition: A | Discharge status: Home / Self Care | Payer: Medicare Other | Source: Ambulatory Visit | Attending: Surgery | Admitting: Surgery

## 2018-11-29 DIAGNOSIS — Z01812 Encounter for preprocedural laboratory examination: Secondary | ICD-10-CM | POA: Diagnosis not present

## 2018-11-29 LAB — CBC
HCT: 42.2 % (ref 39.0–52.0)
Hemoglobin: 14.3 g/dL (ref 13.0–17.0)
MCH: 27.6 pg (ref 26.0–34.0)
MCHC: 33.9 g/dL (ref 30.0–36.0)
MCV: 81.5 fL (ref 80.0–100.0)
Platelets: 200 10*3/uL (ref 150–400)
RBC: 5.18 MIL/uL (ref 4.22–5.81)
RDW: 16.4 % — ABNORMAL HIGH (ref 11.5–15.5)
WBC: 8.8 10*3/uL (ref 4.0–10.5)
nRBC: 0 % (ref 0.0–0.2)

## 2018-11-29 LAB — BASIC METABOLIC PANEL
Anion gap: 8 (ref 5–15)
BUN: 16 mg/dL (ref 8–23)
CO2: 27 mmol/L (ref 22–32)
Calcium: 9.8 mg/dL (ref 8.9–10.3)
Chloride: 104 mmol/L (ref 98–111)
Creatinine, Ser: 1.03 mg/dL (ref 0.61–1.24)
GFR calc Af Amer: >60 mL/min (ref >60)
GFR calc non Af Amer: >60 mL/min (ref >60)
Glucose, Bld: 106 mg/dL — ABNORMAL HIGH (ref 70–99)
Potassium: 3.8 mmol/L (ref 3.5–5.1)
Sodium: 139 mmol/L (ref 135–145)

## 2018-11-29 LAB — HEMOGLOBIN A1C
Hgb A1c MFr Bld: 6.3 % — ABNORMAL HIGH (ref 4.8–5.6)
Mean Plasma Glucose: 134.11 mg/dL

## 2018-11-29 LAB — GLUCOSE, CAPILLARY: Glucose-Capillary: 128 mg/dL — ABNORMAL HIGH (ref 70–99)

## 2018-11-30 NOTE — Anesthesia Preprocedure Evaluation (Addendum)
Anesthesia Evaluation    Reviewed: Allergy & Precautions, Patient's Chart, lab work & pertinent test results  History of Anesthesia Complications Negative for: history of anesthetic complications  Airway Mallampati: II  TM Distance: >3 FB Neck ROM: Full    Dental  (+) Partial Upper   Pulmonary asthma , sleep apnea and Continuous Positive Airway Pressure Ventilation , COPD, former smoker, PE   breath sounds clear to auscultation       Cardiovascular hypertension, Pt. on medications (-) angina+ CAD, + Cardiac Stents and + DVT   Rhythm:Regular Rate:Normal   '13 TTE - mild LVH. EF 50% to 55%. Grade 1 diastolic dysfunction. Trivial AI. Moderately dilated LA.    Neuro/Psych negative neurological ROS  negative psych ROS   GI/Hepatic negative GI ROS, Neg liver ROS,   Endo/Other  diabetes, Type 2, Oral Hypoglycemic Agents Obesity   Renal/GU negative Renal ROS     Musculoskeletal  (+) Arthritis ,   Abdominal   Peds  Hematology negative hematology ROS (+)   Anesthesia Other Findings   Reproductive/Obstetrics                           Anesthesia Physical Anesthesia Plan  ASA: III  Anesthesia Plan: General   Post-op Pain Management:    Induction: Intravenous  PONV Risk Score and Plan: 4 or greater and Treatment may vary due to age or medical condition, Ondansetron and Dexamethasone  Airway Management Planned: Oral ETT  Additional Equipment: None  Intra-op Plan:   Post-operative Plan: Extubation in OR  Informed Consent: I have reviewed the patients History and Physical, chart, labs and discussed the procedure including the risks, benefits and alternatives for the proposed anesthesia with the patient or authorized representative who has indicated his/her understanding and acceptance.     Dental advisory given  Plan Discussed with: CRNA and Anesthesiologist  Anesthesia Plan  Comments:       Anesthesia Quick Evaluation

## 2018-11-30 NOTE — Progress Notes (Signed)
Anesthesia Chart Review   Case:  [id number] Date/Time:  12/08/18 0900   Procedure:  LAPAROSCOPIC CHOLECYSTECTOMY WITH INTRAOPERATIVE CHOLANGIOGRAM (N/A )   Anesthesia type:  General   Pre-op diagnosis:  CHRONIC CHOLECYSTITIS, CHOLELITHIASIS   Location:  [facility] ROOM 01 / [facility]   Surgeon:  Armandina Gemma, MD      DISCUSSION:73 yo former smoker (60 pack years, quit 04/13/11) with h/o HTN, BPH, DM II, DVT, PE, A-fib (on Xarelto), OSA w/CPAP, CHF, HLD, COPD, chronic cholecystitis with percutaneous cholecystostomy tube in place scheduled for above procedure 10/20/18 with Dr. Armandina Gemma.   Pt with recent hospitalization 1/8-1/13/20 due to acute cholecystitis.  Perc chole placed 09/15/18 by IR.   EKG at PST 10/18/18 with new T wave changes concerning for ischemia when compared to EKG dated 09/14/18.  Discussed with Dr. Glennon Mac.  Cardiac input requested prior to proceeding.  Subsequently seen by cardiologist, Dr. Charolette Forward, 10/20/18.  Per OV note, "Patient is an accetable risk for laparoscopic cholecystectomy from cardiac point of view.  Okay to stop Xarelto 2 days prior to the surgery."  Pt can proceed with planned procedure barring acute status change.  VS: BP (!) 152/89 (BP Location: Right Arm)   Pulse (!) 54   Temp 36.6 C (Oral)   Resp 18   Ht 5\' 10"  (1.778 m)   Wt 118.2 kg   SpO2 98%   BMI 37.38 kg/m   PROVIDERS: Seward Carol, MD is PCP   Alphonsus Sias, MD is Cardiologist  LABS: Labs reviewed: Acceptable for surgery. (all labs ordered are listed, but only abnormal results are displayed)  Labs Reviewed  BASIC METABOLIC PANEL - Abnormal; Notable for the following components:      Result Value   Glucose, Bld 106 (*)    All other components within normal limits  CBC - Abnormal; Notable for the following components:   RDW 16.4 (*)    All other components within normal limits  HEMOGLOBIN A1C - Abnormal; Notable for the following components:   Hgb A1c MFr Bld 6.3 (*)    All other  components within normal limits  GLUCOSE, CAPILLARY - Abnormal; Notable for the following components:   Glucose-Capillary 128 (*)    All other components within normal limits     IMAGES: Chest Xray 09/13/2018  FINDINGS: The lungs are clear. Heart size is upper normal. No pneumothorax or pleural effusion. Aortic atherosclerosis is noted. No acute or focal bony abnormality.  IMPRESSION: No acute disease.  EKG: 10/18/2018 Rate 54 bpm Sinus bradycardia Left axis deviation Minimal voltage criteria for LVH, may be normal variant Inferior infarct, age undetermined Possible Anterior infarct, age undetermined When compared to prior tracing, lateral t wave changes are new and concerning for ischemia   CV: Echo 07/05/2012 Study Conclusions  - Left ventricle: The cavity size was normal. Wall thickness was increased in a pattern of mild LVH. Systolic function was normal. The estimated ejection fraction was in the range of 50% to 55%. Regional wall motion abnormalities cannot be excluded. Doppler parameters are consistent with abnormal left ventricular relaxation (grade 1 diastolic dysfunction). - Aortic valve: Trivial regurgitation. - Left atrium: The atrium was moderately dilated. - Atrial septum: No defect or patent foramen ovale was identified. Past Medical History:  Diagnosis Date  . Abdominal pain, right upper quadrant   . Allergic rhinitis   . Asthma    as child  . Atrial enlargement, left y-2  . Benign localized hyperplasia of prostate with urinary obstruction  and other lower urinary tract symptoms (LUTS)(600.21)   . Calculus of gallbladder without mention of cholecystitis or obstruction   . CHF (congestive heart failure) ([place])    managed by Dr Terrence Dupont   . COPD (chronic obstructive pulmonary disease) ([place])   . Diabetes mellitus   . DVT (deep venous thrombosis) ([place])   . Hematuria, unspecified   . HTN (hypertension)   . Hyperlipidemia   .  Hypertension   . Pulmonary embolism ([place])    on xarelto   . Sleep apnea    NPSG 03-17-98, cpap 14  . Type 2 diabetes mellitus ([place])     Past Surgical History:  Procedure Laterality Date  . ANGIOPLASTY  2011 or 2012   stent  . COLONOSCOPY    . CORONARY ANGIOPLASTY WITH STENT PLACEMENT    . IR PERC CHOLECYSTOSTOMY  09/15/2018  . IR RADIOLOGIST EVAL & MGMT  10/10/2018  . POLYPECTOMY      MEDICATIONS: . acetaminophen (TYLENOL) 325 MG tablet  . amiodarone (PACERONE) 200 MG tablet  . amLODipine (NORVASC) 5 MG tablet  . aspirin 81 MG tablet  . atorvastatin (LIPITOR) 40 MG tablet  . finasteride (PROSCAR) 5 MG tablet  . furosemide (LASIX) 40 MG tablet  . glimepiride (AMARYL) 4 MG tablet  . latanoprost (XALATAN) 0.005 % ophthalmic solution  . metFORMIN (GLUCOPHAGE) 500 MG tablet  . nitroGLYCERIN (NITROSTAT) 0.4 MG SL tablet  . oxyCODONE (OXY IR/ROXICODONE) 5 MG immediate release tablet  . polyethylene glycol (MIRALAX / GLYCOLAX) packet  . rivaroxaban (XARELTO) 20 MG TABS tablet  . senna-docusate (SENOKOT-S) 8.6-50 MG tablet   No current facility-administered medications for this encounter.     [name] [facility] Pre-Surgical Testing [phone] 11/30/18 10:49 AM

## 2018-12-04 ENCOUNTER — Encounter (HOSPITAL_COMMUNITY): Payer: Self-pay | Admitting: Surgery

## 2018-12-04 NOTE — H&P (Signed)
General [facility]  Matthew Ochoa DOB: Feb 07, 1946 Married / Language: English / Race: Black or African American Male   History of Present Illness  The patient is a 73 year old male who presents for evaluation of gall stones.  CHIEF COMPLAINT: cholecystitis, cholelithiasis, perc drain  Patient returns for preoperative visit. He had been scheduled to undergo laparoscopic and possible open cholecystectomy for chronic cholecystitis and cholelithiasis with a percutaneous drain in place. Unfortunately at his preoperative visit he was noted to have EKG changes which were of concern to the anesthesiologist. Patient has been evaluated by his cardiologist, Dr. Charolette Ochoa, and the patient has been cleared for cholecystectomy by his cardiologist on October 20, 2018. Patient does have intermittent discomfort from the percutaneous drain which remains in place in the right upper quadrant. The drain has been capped. Patient returns today for preoperative assessment in preparation for surgery on December 08, 2018.   Allergies No Known Allergies [10/04/2018]: No Known Drug Allergies [10/04/2018]:  Medication History  Amiodarone HCl (200MG  Tablet, Oral) Active. amLODIPine Besylate (5MG  Tablet, Oral) Active. Atorvastatin Calcium (40MG  Tablet, Oral) Active. Cephalexin (500MG  Capsule, Oral) Active. Glimepiride (4MG  Tablet, Oral) Active. Latanoprost (0.005% Solution, Ophthalmic) Active. Losartan Potassium (100MG  Tablet, Oral) Active. Furosemide (40MG  Tablet, Oral) Active. metFORMIN HCl (500MG  Tablet, Oral) Active. metroNIDAZOLE (500MG  Tablet, Oral) Active. Xarelto (20MG  Tablet, Oral) Active. Finasteride (5MG  Tablet, Oral) Active. Medications Reconciled  Vitals  Weight: 262.6 lb Height: 70in Body Surface Area: 2.34 m Body Mass Index: 37.68 kg/m  Temp.: 25F(Temporal)  Pulse: 68 (Regular)  BP: 136/64 (Sitting, Left Arm,  Standard)   Physical Exam  Limited examination  Abdomen soft and nontender. There are no palpable masses. The percutaneous drain in the right upper quadrant is secured with a blue suture. There is no sign of infection. There is no drainage. Catheter is taped into a secure position.    Assessment & Plan  CHOLECYSTITIS, ACUTE WITH CHOLELITHIASIS (K80.00) CHOLECYSTOSTOMY CARE (Z43.4)  Instructed to keep follow-up appointment as scheduled  Patient has been cleared by his cardiologist for his upcoming cholecystectomy. We plan laparoscopic cholecystectomy but given his clinical history and the presence of a percutaneous drain, conversion to open surgery as a possibility.  We will arrange for preoperative anesthesia clinic evaluation so that they may review the notes from the patient's cardiologist prior to surgery. Patient will need to hold his anticoagulation prior to surgery.  The risks and benefits of the procedure have been discussed at length with the patient. The patient understands the proposed procedure, potential alternative treatments, and the course of recovery to be expected. All of the patient's questions have been answered at this time. The patient wishes to proceed with surgery.  Matthew Gemma, MD [facility] Office: [phone]  PREVIOUS OFFICE NOTE:  Matthew Ochoa Documented: 10/04/2018 11:16 AM Location: [facility] Patient #: [id number] DOB: 01-Nov-1945 Married / Language: English / Race: Black or African American Male   History of Present Illness Matthew Regal MD; 10/04/2018 11:39 AM) The patient is a 73 year old male who presents for evaluation of gall stones.  CHIEF COMPLAINT: cholecystitis, cholelithiasis  Patient returns for surgical follow-up having been seen in consultation during his hospitalization in early January. Patient presented with a one-week history of abdominal pain and symptoms related to acute cholecystitis and  cholelithiasis. Percutaneous drainage was employed. The patient has dramatically improved and presents today for evaluation and further recommendations. He is feeling well and is  anxious to get back to his normal activities. Patient is scheduled to be seen by interventional radiology on February 4 for catheter injection and cholangiography.   Problem List/Past Medical Matthew Regal, MD; 10/04/2018 11:42 AM) CHOLECYSTITIS, ACUTE WITH CHOLELITHIASIS (K80.00)  CHOLECYSTOSTOMY CARE (Z43.4)   Past Surgical History Matthew Ochoa, CMA; 10/04/2018 11:16 AM) Colon Polyp Removal - Colonoscopy   Allergies (Matthew Ochoa, CMA; 10/04/2018 11:17 AM) No Known Allergies [10/04/2018]: No Known Drug Allergies [10/04/2018]: Allergies Reconciled   Medication History Matthew Ochoa, CMA; 10/04/2018 11:17 AM) Amiodarone HCl (200MG  Tablet, Oral) Active. amLODIPine Besylate (5MG  Tablet, Oral) Active. Atorvastatin Calcium (40MG  Tablet, Oral) Active. Cephalexin (500MG  Capsule, Oral) Active. Glimepiride (4MG  Tablet, Oral) Active. Latanoprost (0.005% Solution, Ophthalmic) Active. Losartan Potassium (100MG  Tablet, Oral) Active. Furosemide (40MG  Tablet, Oral) Active. metFORMIN HCl (500MG  Tablet, Oral) Active. metroNIDAZOLE (500MG  Tablet, Oral) Active. Xarelto (20MG  Tablet, Oral) Active. Medications Reconciled  Social History Matthew Ochoa, CMA; 10/04/2018 11:16 AM) Alcohol use  Moderate alcohol use. No drug use  Tobacco use  Former smoker.  Family History Matthew Ochoa, [place]; 10/04/2018 11:16 AM) Hypertension  Father. Respiratory Condition  Father.  Other Problems Matthew Regal, MD; 10/04/2018 11:42 AM) Back Pain  Bladder Problems  Enlarged Prostate  High blood pressure  Hypercholesterolemia  Sleep Apnea     Review of Systems (Matthew Ochoa CMA; 10/04/2018 11:16 AM) Breast Not Present- Breast Mass, Breast Pain, Nipple Discharge and Skin Changes. Male Genitourinary  Not Present- Blood in Urine, Change in Urinary Stream, Frequency, Impotence, Nocturia, Painful Urination, Urgency and Urine Leakage.  Vitals (Matthew Ochoa CMA; 10/04/2018 11:18 AM) 10/04/2018 11:17 AM Weight: 277.5 lb Height: 70in Body Surface Area: 2.4 m Body Mass Index: 39.82 kg/m  Temp.: 97.41F(Temporal)  Pulse: 81 (Regular)  P.OX: 90% (Room air) BP: 142/78 (Sitting, Left Arm, Standard)       Physical Exam Matthew Regal MD; 10/04/2018 11:40 AM) The physical exam findings are as follows: Note:See vital signs recorded above  GENERAL APPEARANCE Development: normal Nutritional status: normal Gross deformities: none  SKIN Rash, lesions, ulcers: none Induration, erythema: none Nodules: none palpable  EYES Conjunctiva and lids: normal Pupils: equal and reactive Iris: normal bilaterally  EARS, NOSE, MOUTH, THROAT External ears: no lesion or deformity External nose: no lesion or deformity Hearing: grossly normal Lips: no lesion or deformity Dentition: normal for age Oral mucosa: moist  NECK Symmetric: yes Trachea: midline Thyroid: no palpable nodules in the thyroid bed  CHEST Respiratory effort: normal Retraction or accessory muscle use: no Breath sounds: normal bilaterally Rales, rhonchi, wheeze: none  CARDIOVASCULAR Auscultation: regular rhythm, normal rate Murmurs: none Pulses: carotid and radial pulse 2+ palpable Lower extremity edema: none Lower extremity varicosities: none  ABDOMEN Distension: none Masses: none palpable Tenderness: Mild tenderness to deep palpation right upper quadrant, no palpable mass Hepatosplenomegaly: not present Hernia: not present Percutaneous drain right lateral abdominal wall with thin normal appearing bilious fluid in the collection bag  MUSCULOSKELETAL Station and gait: normal Digits and nails: no clubbing or cyanosis Muscle strength: grossly normal all extremities Range of motion: grossly normal all  extremities Deformity: none  LYMPHATIC Cervical: none palpable Supraclavicular: none palpable  PSYCHIATRIC Oriented to person, place, and time: yes Mood and affect: normal for situation Judgment and insight: appropriate for situation    Assessment & Plan Matthew Regal MD; 10/04/2018 11:43 AM) CHOLECYSTITIS, ACUTE WITH CHOLELITHIASIS (K80.00) Current Plans Pt Education - Pamphlet Given - Laparoscopic Gallbladder Surgery: discussed with patient and provided information. CHOLECYSTOSTOMY CARE (Z43.4)  Current Plans Follow Up - Call [facility] office after tests / studies doneto discuss further plans  Patient has recovered nicely since his hospitalization earlier this month with acute cholecystitis and cholelithiasis. Cholecystostomy percutaneous drain remains in place. There is normal-appearing bile in the drainage bag.  Patient is scheduled for cholecystostomy tube injection by interventional radiology on February 4. We will await the results of that study before planning further operative interventions. Hopefully, the patient will be able to proceed with laparoscopic cholecystectomy in approximately 2-3 weeks as a scheduled procedure. We will contact the patient with the results of his cholecystostomy tube injection when they are available next week.  Patient is provided with literature on gallbladder surgery. We discussed laparoscopic cholecystectomy today. We discussed the potential for conversion to open surgery. We discussed his Ochoa stay and postoperative recovery and return to normal activities. Patient would like to proceed in the near future.  Signed by Matthew Regal, MD (10/04/2018 11:44 AM)

## 2018-12-08 ENCOUNTER — Ambulatory Visit (HOSPITAL_COMMUNITY): Payer: Medicare Other | Admitting: Physician Assistant

## 2018-12-08 ENCOUNTER — Ambulatory Visit (HOSPITAL_COMMUNITY): Payer: Medicare Other | Admitting: Anesthesiology

## 2018-12-08 ENCOUNTER — Encounter (HOSPITAL_COMMUNITY)
Admission: RE | Disposition: A | Discharge status: Home / Self Care | Payer: Self-pay | Source: Home / Self Care | Attending: Surgery

## 2018-12-08 ENCOUNTER — Observation Stay (HOSPITAL_COMMUNITY)
Admission: RE | Admit: 2018-12-08 | Discharge: 2018-12-09 | Disposition: A | Discharge status: Home / Self Care | Payer: Medicare Other | Attending: Surgery | Admitting: Surgery

## 2018-12-08 ENCOUNTER — Other Ambulatory Visit: Payer: Self-pay

## 2018-12-08 ENCOUNTER — Telehealth (HOSPITAL_COMMUNITY): Payer: Self-pay | Admitting: *Deleted

## 2018-12-08 ENCOUNTER — Encounter (HOSPITAL_COMMUNITY): Payer: Self-pay | Admitting: *Deleted

## 2018-12-08 DIAGNOSIS — Z86711 Personal history of pulmonary embolism: Secondary | ICD-10-CM | POA: Insufficient documentation

## 2018-12-08 DIAGNOSIS — J449 Chronic obstructive pulmonary disease, unspecified: Secondary | ICD-10-CM | POA: Diagnosis not present

## 2018-12-08 DIAGNOSIS — K810 Acute cholecystitis: Secondary | ICD-10-CM

## 2018-12-08 DIAGNOSIS — E119 Type 2 diabetes mellitus without complications: Secondary | ICD-10-CM | POA: Insufficient documentation

## 2018-12-08 DIAGNOSIS — Z955 Presence of coronary angioplasty implant and graft: Secondary | ICD-10-CM | POA: Diagnosis not present

## 2018-12-08 DIAGNOSIS — K8010 Calculus of gallbladder with chronic cholecystitis without obstruction: Principal | ICD-10-CM | POA: Insufficient documentation

## 2018-12-08 DIAGNOSIS — I10 Essential (primary) hypertension: Secondary | ICD-10-CM | POA: Insufficient documentation

## 2018-12-08 DIAGNOSIS — G4730 Sleep apnea, unspecified: Secondary | ICD-10-CM | POA: Diagnosis not present

## 2018-12-08 DIAGNOSIS — Z87891 Personal history of nicotine dependence: Secondary | ICD-10-CM | POA: Insufficient documentation

## 2018-12-08 DIAGNOSIS — Z86718 Personal history of other venous thrombosis and embolism: Secondary | ICD-10-CM | POA: Insufficient documentation

## 2018-12-08 DIAGNOSIS — Z6836 Body mass index (BMI) 36.0-36.9, adult: Secondary | ICD-10-CM | POA: Insufficient documentation

## 2018-12-08 DIAGNOSIS — E669 Obesity, unspecified: Secondary | ICD-10-CM | POA: Insufficient documentation

## 2018-12-08 DIAGNOSIS — Z7901 Long term (current) use of anticoagulants: Secondary | ICD-10-CM | POA: Diagnosis not present

## 2018-12-08 DIAGNOSIS — Z79899 Other long term (current) drug therapy: Secondary | ICD-10-CM | POA: Insufficient documentation

## 2018-12-08 DIAGNOSIS — Z7984 Long term (current) use of oral hypoglycemic drugs: Secondary | ICD-10-CM | POA: Insufficient documentation

## 2018-12-08 DIAGNOSIS — I2510 Atherosclerotic heart disease of native coronary artery without angina pectoris: Secondary | ICD-10-CM | POA: Diagnosis not present

## 2018-12-08 DIAGNOSIS — K8000 Calculus of gallbladder with acute cholecystitis without obstruction: Secondary | ICD-10-CM | POA: Diagnosis present

## 2018-12-08 HISTORY — PX: CHOLECYSTECTOMY: SHX55

## 2018-12-08 LAB — GLUCOSE, CAPILLARY
Glucose-Capillary: 98 mg/dL (ref 70–99)
Glucose-Capillary: 188 mg/dL — ABNORMAL HIGH (ref 70–99)

## 2018-12-08 SURGERY — LAPAROSCOPIC CHOLECYSTECTOMY WITH INTRAOPERATIVE CHOLANGIOGRAM
Anesthesia: General | Site: Abdomen

## 2018-12-08 MED ORDER — CHLORHEXIDINE GLUCONATE CLOTH 2 % EX PADS: 6.0000 | MEDICATED_PAD | Freq: Once | CUTANEOUS | Status: DC

## 2018-12-08 MED ORDER — FENTANYL CITRATE (PF) 100 MCG/2ML IJ SOLN
INTRAMUSCULAR | Status: DC | PRN
  Administered 2018-12-08 (×4): 50 ug via INTRAVENOUS

## 2018-12-08 MED ORDER — LIDOCAINE 2% (20 MG/ML) 5 ML SYRINGE
INTRAMUSCULAR | Status: AC
  Filled 2018-12-08: qty 5

## 2018-12-08 MED ORDER — GLIMEPIRIDE 4 MG PO TABS
4.0000 mg | ORAL_TABLET | Freq: Every day | ORAL | Status: DC
  Administered 2018-12-09: 09:00:00 4 mg via ORAL
  Filled 2018-12-08: qty 1

## 2018-12-08 MED ORDER — SUCCINYLCHOLINE CHLORIDE 200 MG/10ML IV SOSY
PREFILLED_SYRINGE | INTRAVENOUS | Status: DC | PRN
  Administered 2018-12-08: 120 mg via INTRAVENOUS

## 2018-12-08 MED ORDER — PROPOFOL 10 MG/ML IV BOLUS
INTRAVENOUS | Status: AC
  Filled 2018-12-08: qty 20

## 2018-12-08 MED ORDER — PROPOFOL 10 MG/ML IV BOLUS
INTRAVENOUS | Status: DC | PRN
  Administered 2018-12-08: 120 mg via INTRAVENOUS
  Administered 2018-12-08: 20 mg via INTRAVENOUS

## 2018-12-08 MED ORDER — ACETAMINOPHEN 650 MG RE SUPP: 650.0000 mg | Freq: Four times a day (QID) | RECTAL | Status: DC | PRN

## 2018-12-08 MED ORDER — DEXAMETHASONE SODIUM PHOSPHATE 10 MG/ML IJ SOLN
INTRAMUSCULAR | Status: DC | PRN
  Administered 2018-12-08: 6 mg via INTRAVENOUS

## 2018-12-08 MED ORDER — GLYCOPYRROLATE PF 0.2 MG/ML IJ SOSY
PREFILLED_SYRINGE | INTRAMUSCULAR | Status: AC
  Filled 2018-12-08: qty 2

## 2018-12-08 MED ORDER — ONDANSETRON HCL 4 MG/2ML IJ SOLN: 4.0000 mg | Freq: Four times a day (QID) | INTRAMUSCULAR | Status: DC | PRN

## 2018-12-08 MED ORDER — FENTANYL CITRATE (PF) 100 MCG/2ML IJ SOLN
INTRAMUSCULAR | Status: AC
  Filled 2018-12-08: qty 2

## 2018-12-08 MED ORDER — ROCURONIUM BROMIDE 10 MG/ML (PF) SYRINGE
PREFILLED_SYRINGE | INTRAVENOUS | Status: DC | PRN
  Administered 2018-12-08 (×2): 10 mg via INTRAVENOUS
  Administered 2018-12-08: 50 mg via INTRAVENOUS

## 2018-12-08 MED ORDER — ONDANSETRON HCL 4 MG/2ML IJ SOLN
INTRAMUSCULAR | Status: AC
  Filled 2018-12-08: qty 6

## 2018-12-08 MED ORDER — HYDROMORPHONE HCL 1 MG/ML IJ SOLN
INTRAMUSCULAR | Status: AC
  Filled 2018-12-08: qty 1

## 2018-12-08 MED ORDER — LACTATED RINGERS IV SOLN
INTRAVENOUS | Status: DC
  Administered 2018-12-08 (×2): via INTRAVENOUS

## 2018-12-08 MED ORDER — SUCCINYLCHOLINE CHLORIDE 200 MG/10ML IV SOSY
PREFILLED_SYRINGE | INTRAVENOUS | Status: AC
  Filled 2018-12-08: qty 10

## 2018-12-08 MED ORDER — FENTANYL CITRATE (PF) 100 MCG/2ML IJ SOLN
25.0000 ug | INTRAMUSCULAR | Status: DC | PRN
  Administered 2018-12-08 (×3): 50 ug via INTRAVENOUS

## 2018-12-08 MED ORDER — BUPIVACAINE-EPINEPHRINE 0.25% -1:200000 IJ SOLN
INTRAMUSCULAR | Status: DC | PRN
  Administered 2018-12-08: 20 mL

## 2018-12-08 MED ORDER — SUGAMMADEX SODIUM 200 MG/2ML IV SOLN
INTRAVENOUS | Status: DC | PRN
  Administered 2018-12-08: 250 mg via INTRAVENOUS

## 2018-12-08 MED ORDER — ROCURONIUM BROMIDE 100 MG/10ML IV SOLN
INTRAVENOUS | Status: AC
  Filled 2018-12-08: qty 2

## 2018-12-08 MED ORDER — BUPIVACAINE-EPINEPHRINE (PF) 0.25% -1:200000 IJ SOLN
INTRAMUSCULAR | Status: AC
  Filled 2018-12-08: qty 30

## 2018-12-08 MED ORDER — GLYCOPYRROLATE 0.2 MG/ML IJ SOLN
INTRAMUSCULAR | Status: DC | PRN
  Administered 2018-12-08: 0.3 mg via INTRAVENOUS

## 2018-12-08 MED ORDER — SUGAMMADEX SODIUM 500 MG/5ML IV SOLN
INTRAVENOUS | Status: AC
  Filled 2018-12-08: qty 5

## 2018-12-08 MED ORDER — AMLODIPINE BESYLATE 5 MG PO TABS: 5.0000 mg | ORAL_TABLET | Freq: Every day | ORAL | Status: DC

## 2018-12-08 MED ORDER — ONDANSETRON HCL 4 MG/2ML IJ SOLN
INTRAMUSCULAR | Status: DC | PRN
  Administered 2018-12-08: 4 mg via INTRAVENOUS

## 2018-12-08 MED ORDER — TRAMADOL HCL 50 MG PO TABS
50.0000 mg | ORAL_TABLET | Freq: Four times a day (QID) | ORAL | Status: DC | PRN
  Administered 2018-12-09: 06:00:00 50 mg via ORAL
  Filled 2018-12-08 (×2): qty 1

## 2018-12-08 MED ORDER — OXYCODONE HCL 5 MG PO TABS
5.0000 mg | ORAL_TABLET | ORAL | Status: DC | PRN
  Administered 2018-12-08 (×3): 5 mg via ORAL
  Filled 2018-12-08 (×3): qty 1

## 2018-12-08 MED ORDER — FINASTERIDE 5 MG PO TABS
5.0000 mg | ORAL_TABLET | Freq: Every day | ORAL | Status: DC
  Administered 2018-12-08: 5 mg via ORAL
  Filled 2018-12-08: qty 1

## 2018-12-08 MED ORDER — DEXAMETHASONE SODIUM PHOSPHATE 10 MG/ML IJ SOLN
INTRAMUSCULAR | Status: AC
  Filled 2018-12-08: qty 3

## 2018-12-08 MED ORDER — ONDANSETRON 4 MG PO TBDP: 4.0000 mg | ORAL_TABLET | Freq: Four times a day (QID) | ORAL | Status: DC | PRN

## 2018-12-08 MED ORDER — ONDANSETRON HCL 4 MG/2ML IJ SOLN: 4.0000 mg | Freq: Once | INTRAMUSCULAR | Status: DC | PRN

## 2018-12-08 MED ORDER — ACETAMINOPHEN 325 MG PO TABS: 650.0000 mg | ORAL_TABLET | Freq: Four times a day (QID) | ORAL | Status: DC | PRN

## 2018-12-08 MED ORDER — METFORMIN HCL 500 MG PO TABS
1000.0000 mg | ORAL_TABLET | Freq: Two times a day (BID) | ORAL | Status: DC
  Administered 2018-12-08 – 2018-12-09 (×2): 1000 mg via ORAL
  Filled 2018-12-08 (×2): qty 2

## 2018-12-08 MED ORDER — OXYCODONE HCL 5 MG PO TABS: 5.0000 mg | ORAL_TABLET | Freq: Once | ORAL | Status: DC | PRN

## 2018-12-08 MED ORDER — LATANOPROST 0.005 % OP SOLN
1.0000 [drp] | Freq: Every day | OPHTHALMIC | Status: DC
  Administered 2018-12-08: 1 [drp] via OPHTHALMIC
  Filled 2018-12-08: qty 2.5

## 2018-12-08 MED ORDER — OXYCODONE HCL 5 MG/5ML PO SOLN: 5.0000 mg | Freq: Once | ORAL | Status: DC | PRN

## 2018-12-08 MED ORDER — HYDROMORPHONE HCL 1 MG/ML IJ SOLN
1.0000 mg | INTRAMUSCULAR | Status: DC | PRN
  Administered 2018-12-08 (×2): 0.5 mg via INTRAVENOUS

## 2018-12-08 MED ORDER — DEXTROSE 5 % IV SOLN
3.0000 g | INTRAVENOUS | Status: AC
  Administered 2018-12-08: 3 g via INTRAVENOUS
  Filled 2018-12-08: qty 3

## 2018-12-08 MED ORDER — LACTATED RINGERS IV SOLN
INTRAVENOUS | Status: AC | PRN
  Administered 2018-12-08 (×2): 1000 mL

## 2018-12-08 MED ORDER — POTASSIUM CHLORIDE IN NACL 20-0.45 MEQ/L-% IV SOLN
INTRAVENOUS | Status: DC
  Administered 2018-12-08: 14:00:00 via INTRAVENOUS
  Filled 2018-12-08 (×2): qty 1000

## 2018-12-08 MED ORDER — BACITRACIN ZINC 500 UNIT/GM EX OINT
TOPICAL_OINTMENT | CUTANEOUS | Status: AC
  Filled 2018-12-08: qty 28.35

## 2018-12-08 MED ORDER — FUROSEMIDE 20 MG PO TABS
20.0000 mg | ORAL_TABLET | Freq: Every day | ORAL | Status: DC
  Administered 2018-12-08: 14:00:00 20 mg via ORAL
  Filled 2018-12-08: qty 1

## 2018-12-08 MED ORDER — LIDOCAINE 2% (20 MG/ML) 5 ML SYRINGE
INTRAMUSCULAR | Status: DC | PRN
  Administered 2018-12-08: 40 mg via INTRAVENOUS
  Administered 2018-12-08: 60 mg via INTRAVENOUS

## 2018-12-08 MED ORDER — AMIODARONE HCL 100 MG PO TABS: 100.0000 mg | ORAL_TABLET | ORAL | Status: DC

## 2018-12-08 SURGICAL SUPPLY — 38 items
ADH SKN CLS APL DERMABOND .7 (GAUZE/BANDAGES/DRESSINGS) ×1
APL PRP STRL LF DISP 70% ISPRP (MISCELLANEOUS) ×2
APPLIER CLIP ROT 10 11.4 M/L (STAPLE) ×2
APR CLP MED LRG 11.4X10 (STAPLE) ×1
BAG SPEC RTRVL LRG 6X4 10 (ENDOMECHANICALS) ×1
CABLE HIGH FREQUENCY MONO STRZ (ELECTRODE) ×2 IMPLANT
CHLORAPREP W/TINT 26 (MISCELLANEOUS) ×4 IMPLANT
CLIP APPLIE ROT 10 11.4 M/L (STAPLE) ×1 IMPLANT
COVER MAYO STAND STRL (DRAPES) ×2 IMPLANT
COVER SURGICAL LIGHT HANDLE (MISCELLANEOUS) ×2 IMPLANT
COVER WAND RF STERILE (DRAPES) IMPLANT
DECANTER SPIKE VIAL GLASS SM (MISCELLANEOUS) ×2 IMPLANT
DERMABOND ADVANCED (GAUZE/BANDAGES/DRESSINGS) ×1
DERMABOND ADVANCED .7 DNX12 (GAUZE/BANDAGES/DRESSINGS) IMPLANT
DRAPE C-ARM 42X120 X-RAY (DRAPES) ×2 IMPLANT
ELECT REM PT RETURN 15FT ADLT (MISCELLANEOUS) ×2 IMPLANT
GAUZE SPONGE 2X2 8PLY STRL LF (GAUZE/BANDAGES/DRESSINGS) ×1 IMPLANT
GLOVE SURG ORTHO 8.0 STRL STRW (GLOVE) ×2 IMPLANT
GOWN STRL REUS W/TWL XL LVL3 (GOWN DISPOSABLE) ×4 IMPLANT
HEMOSTAT SURGICEL 4X8 (HEMOSTASIS) IMPLANT
KIT BASIN OR (CUSTOM PROCEDURE TRAY) ×2 IMPLANT
KIT TURNOVER KIT A (KITS) IMPLANT
POUCH SPECIMEN RETRIEVAL 10MM (ENDOMECHANICALS) ×2 IMPLANT
SCISSORS LAP 5X35 DISP (ENDOMECHANICALS) ×2 IMPLANT
SET CHOLANGIOGRAPH MIX (MISCELLANEOUS) ×2 IMPLANT
SET IRRIG TUBING LAPAROSCOPIC (IRRIGATION / IRRIGATOR) ×2 IMPLANT
SET TUBE SMOKE EVAC HIGH FLOW (TUBING) ×1 IMPLANT
SLEEVE XCEL OPT CAN 5 100 (ENDOMECHANICALS) ×2 IMPLANT
SPONGE GAUZE 2X2 STER 10/PKG (GAUZE/BANDAGES/DRESSINGS) ×1
STRIP CLOSURE SKIN 1/2X4 (GAUZE/BANDAGES/DRESSINGS) ×2 IMPLANT
SUT MNCRL AB 4-0 PS2 18 (SUTURE) ×2 IMPLANT
SUT VICRYL 0 UR6 27IN ABS (SUTURE) ×1 IMPLANT
TOWEL OR 17X26 10 PK STRL BLUE (TOWEL DISPOSABLE) ×2 IMPLANT
TOWEL OR NON WOVEN STRL DISP B (DISPOSABLE) ×2 IMPLANT
TRAY LAPAROSCOPIC (CUSTOM PROCEDURE TRAY) ×2 IMPLANT
TROCAR BLADELESS OPT 5 100 (ENDOMECHANICALS) ×2 IMPLANT
TROCAR XCEL BLUNT TIP 100MML (ENDOMECHANICALS) ×2 IMPLANT
TROCAR XCEL NON-BLD 11X100MML (ENDOMECHANICALS) ×2 IMPLANT

## 2018-12-08 NOTE — Op Note (Signed)
Procedure Note  Pre-operative Diagnosis:  Cholecystitis, cholelithiasis, percutaneous cholecystostomy in place  Post-operative Diagnosis:  same  Surgeon:  Armandina Gemma, MD  Assistant:  Alphonsa Overall, MD   Procedure:  Laparoscopic cholecystectomy  Anesthesia:  General  Estimated Blood Loss:  minimal  Drains: none         Specimen: gallbladder to pathology  Indications:  Patient is a 73 yo BM with hx of acute cholecystitis with percutaneous cholecystostomy drain.  Now for drain removal and cholecystectomy.  Procedure Details:  The patient was seen in the pre-op holding area. The risks, benefits, complications, treatment options, and expected outcomes were previously discussed with the patient. The patient agreed with the proposed plan and has signed the informed consent form.  The patient was transported to operating room # 1 at the [facility]. The patient was placed in the supine position on the operating room table. Following induction of general anesthesia, the abdomen was prepped and draped in the usual aseptic fashion.  An incision was made in the skin near the umbilicus. The midline fascia was incised and the peritoneal cavity was entered and a Hasson cannula was introduced under direct vision. The cannula was secured with a 0-Vicryl pursestring suture. Pneumoperitoneum was established with carbon dioxide. Additional cannulae were introduced under direct vision along the right costal margin in the midline, mid-clavicular line, and anterior axillary line.   The gallbladder was identified.  The cholecystostomy drain went through the omentum and into the liver.  Drain was divided and removed.  The omentum was mobilized off of the gallbladder and the fundus grasped and retracted cephalad. Adhesions were taken down bluntly and the electrocautery was utilized as needed, taking care not to involve any adjacent structures. The infundibulum was grasped and retracted laterally, exposing  the peritoneum overlying the triangle of Calot. The peritoneum was incised and structures exposed with blunt dissection. There was diffuse thickening of the gallbladder wall and peritoneum.  Structures were carefully dissected out.  The cystic duct was clearly identified, bluntly dissected circumferentially, and clipped at the neck of the gallbladder.  An incision was made in the cystic duct, but a lumen was never definitely identified.  Given normal LFT's, the cholangiogram was cancelled and the duct triply clipped and divided. The cystic artery was identified, dissected circumferentially, ligated with ligaclips, and divided.  The gallbladder was dissected away from the gallbladder bed using the electrocautery for hemostasis.  There was not a clear plain of dissection.  The gallbladder was completely removed from the liver and placed into an endocatch bag. The gallbladder was removed in the endocatch bag through the umbilical port site and submitted to pathology for review.  It contained multiple stones and debris.  The right upper quadrant was irrigated and the gallbladder bed was inspected. Hemostasis was achieved with the electrocautery.  Cannulae were removed under direct vision and good hemostasis was noted. Pneumoperitoneum was released and the majority of the carbon dioxide evacuated. The umbilical wound was irrigated and the fascia was then closed with the pursestring suture.  Local anesthetic was infiltrated at all port sites. Skin incisions were closed with 4-0 Monocril subcuticular sutures and Dermabond was applied.  Bacitracin and a bandaid were placed over the previous drain site.  Instrument, sponge, and needle counts were correct at the conclusion of the case.  The patient was awakened from anesthesia and brought to the recovery room in stable condition.  The patient tolerated the procedure well.   Armandina Gemma, MD [facility] Office: [phone]

## 2018-12-08 NOTE — Interval H&P Note (Signed)
History and Physical Interval Note:  12/08/2018 9:11 AM  Matthew Ochoa.  has presented today for surgery, with the diagnosis of CHRONIC CHOLECYSTITIS, CHOLELITHIASIS.  The various methods of treatment have been discussed with the patient and family. After consideration of risks, benefits and other options for treatment, the patient has consented to  Procedure(s): LAPAROSCOPIC CHOLECYSTECTOMY WITH INTRAOPERATIVE CHOLANGIOGRAM (N/A) as a surgical intervention.  The patient's history has been reviewed, patient examined, no change in status, stable for surgery.  I have reviewed the patient's chart and labs.  Questions were answered to the patient's satisfaction.     Armandina Gemma

## 2018-12-08 NOTE — Anesthesia Postprocedure Evaluation (Signed)
Anesthesia Post Note  Patient: Matthew Ochoa.  Procedure(s) Performed: LAPAROSCOPIC CHOLECYSTECTOMY WITH INTRAOPERATIVE CHOLANGIOGRAM (N/A Abdomen)     Patient location during evaluation: PACU Anesthesia Type: General Level of consciousness: awake and alert Pain management: pain level controlled Vital Signs Assessment: post-procedure vital signs reviewed and stable Respiratory status: spontaneous breathing, nonlabored ventilation and respiratory function stable Cardiovascular status: blood pressure returned to baseline and stable Postop Assessment: no apparent nausea or vomiting Anesthetic complications: no    Last Vitals:  Vitals:   12/08/18 1215 12/08/18 1240  BP:  (!) 154/82  Pulse: 80 88  Resp: 18 19  Temp:    SpO2: 99% 93%    Last Pain:  Vitals:   12/08/18 1240  TempSrc:   PainSc: [place]

## 2018-12-08 NOTE — Transfer of Care (Signed)
Immediate Anesthesia Transfer of Care Note  Patient: Matthew Ochoa.  Procedure(s) Performed: Procedure(s): LAPAROSCOPIC CHOLECYSTECTOMY WITH INTRAOPERATIVE CHOLANGIOGRAM (N/A)  Patient Location: PACU  Anesthesia Type:General  Level of Consciousness:  sedated, patient cooperative and responds to stimulation  Airway & Oxygen Therapy:Patient Spontanous Breathing and Patient connected to face mask oxgen  Post-op Assessment:  Report given to PACU RN and Post -op Vital signs reviewed and stable  Post vital signs:  Reviewed and stable  Last Vitals:  Vitals:   12/08/18 0703  BP: (!) 149/100  Pulse: (!) 58  Resp: 18  Temp: 37.2 C  SpO2: 02%    Complications: No apparent anesthesia complications

## 2018-12-08 NOTE — Anesthesia Procedure Notes (Signed)
Procedure Name: Intubation Date/Time: 12/08/2018 9:40 AM Performed by: Lavina Hamman, CRNA Pre-anesthesia Checklist: Patient identified, Emergency Drugs available, Suction available, Patient being monitored and Timeout performed Patient Re-evaluated:Patient Re-evaluated prior to induction Oxygen Delivery Method: Circle system utilized Preoxygenation: Pre-oxygenation with 100% oxygen Induction Type: IV induction Ventilation: Mask ventilation without difficulty Laryngoscope Size: Mac and 4 Grade View: Grade II Tube type: Oral Tube size: 7.0 mm Number of attempts: 1 Airway Equipment and Method: Stylet Placement Confirmation: ETT inserted through vocal cords under direct vision,  positive ETCO2,  CO2 detector and breath sounds checked- equal and bilateral Secured at: 23 cm Tube secured with: Tape Dental Injury: Teeth and Oropharynx as per pre-operative assessment  Comments: ATOI.  RSI due to risk of Covid 19.  Patient asymptomatic.

## 2018-12-09 DIAGNOSIS — E669 Obesity, unspecified: Secondary | ICD-10-CM | POA: Diagnosis not present

## 2018-12-09 DIAGNOSIS — K8010 Calculus of gallbladder with chronic cholecystitis without obstruction: Secondary | ICD-10-CM | POA: Diagnosis not present

## 2018-12-09 DIAGNOSIS — I10 Essential (primary) hypertension: Secondary | ICD-10-CM | POA: Diagnosis not present

## 2018-12-09 DIAGNOSIS — G4730 Sleep apnea, unspecified: Secondary | ICD-10-CM | POA: Diagnosis not present

## 2018-12-09 DIAGNOSIS — E119 Type 2 diabetes mellitus without complications: Secondary | ICD-10-CM | POA: Diagnosis not present

## 2018-12-09 DIAGNOSIS — J449 Chronic obstructive pulmonary disease, unspecified: Secondary | ICD-10-CM | POA: Diagnosis not present

## 2018-12-09 LAB — CBC
HCT: 34.9 % — ABNORMAL LOW (ref 39.0–52.0)
Hemoglobin: 12.2 g/dL — ABNORMAL LOW (ref 13.0–17.0)
MCH: 28.2 pg (ref 26.0–34.0)
MCHC: 35 g/dL (ref 30.0–36.0)
MCV: 80.6 fL (ref 80.0–100.0)
Platelets: 155 10*3/uL (ref 150–400)
RBC: 4.33 MIL/uL (ref 4.22–5.81)
RDW: 15.9 % — ABNORMAL HIGH (ref 11.5–15.5)
WBC: 14.3 10*3/uL — ABNORMAL HIGH (ref 4.0–10.5)
nRBC: 0 % (ref 0.0–0.2)

## 2018-12-09 LAB — COMPREHENSIVE METABOLIC PANEL
ALT: 36 U/L (ref 0–44)
AST: 30 U/L (ref 15–41)
Albumin: 3.5 g/dL (ref 3.5–5.0)
Alkaline Phosphatase: 37 U/L — ABNORMAL LOW (ref 38–126)
Anion gap: 9 (ref 5–15)
BUN: 13 mg/dL (ref 8–23)
CO2: 24 mmol/L (ref 22–32)
Calcium: 8.9 mg/dL (ref 8.9–10.3)
Chloride: 103 mmol/L (ref 98–111)
Creatinine, Ser: 1.01 mg/dL (ref 0.61–1.24)
GFR calc Af Amer: >60 mL/min (ref >60)
GFR calc non Af Amer: >60 mL/min (ref >60)
Glucose, Bld: 155 mg/dL — ABNORMAL HIGH (ref 70–99)
Potassium: 4.1 mmol/L (ref 3.5–5.1)
Sodium: 136 mmol/L (ref 135–145)
Total Bilirubin: 0.9 mg/dL (ref 0.3–1.2)
Total Protein: 6.8 g/dL (ref 6.5–8.1)

## 2018-12-09 MED ORDER — TRIPLE ANTIBIOTIC 5-400-5000 EX OINT: TOPICAL_OINTMENT | Freq: Four times a day (QID) | CUTANEOUS | 0 refills | Status: DC

## 2018-12-09 MED ORDER — TRAMADOL HCL 50 MG PO TABS: 50.0000 mg | ORAL_TABLET | Freq: Four times a day (QID) | ORAL | 0 refills | Status: DC | PRN

## 2018-12-09 MED ORDER — MUPIROCIN 2 % EX OINT
TOPICAL_OINTMENT | CUTANEOUS | Status: AC
  Filled 2018-12-09: qty 22

## 2018-12-09 NOTE — Discharge Summary (Signed)
Physician Discharge Summary [facility]  Patient ID: Matthew Ochoa. MRN: [id number] DOB/AGE: August 05, 1946 73 y.o.  Admit date: 12/08/2018 Discharge date: 12/09/2018  Admission Diagnoses:  Cholecystitis, cholelithiasis, percutaneous cholecystostomy tube in place  Discharge Diagnoses:  Principal Problem:   Acute cholecystitis Active Problems:   Cholecystitis with cholelithiasis   Discharged Condition: good  Hospital Course: Patient was admitted for observation following gallbladder surgery.  Post op course was uncomplicated.  Pain was well controlled.  Tolerated diet.  Patient was prepared for discharge home on POD#1.  Consults: None  Treatments: surgery: lap cholecystectomy  Discharge Exam: Blood pressure 117/74, pulse 68, temperature 98.8 F (37.1 C), temperature source Oral, resp. rate 15, height 5\' 11"  (1.803 m), weight 117.9 kg, SpO2 92 %. HEENT - clear Neck - soft Chest - clear bilaterally Cor - RRR Abd - soft, protuberant; wounds dry and intact  Disposition: Home   Allergies as of 12/09/2018      Reactions   Lisinopril Cough      Medication List    TAKE these medications   acetaminophen 325 MG tablet Commonly known as:  TYLENOL Take 325-650 mg by mouth every 6 (six) hours as needed for mild pain or headache.   amiodarone 200 MG tablet Commonly known as:  PACERONE Take 100 mg by mouth every Monday, Wednesday, and Friday.   amLODipine 5 MG tablet Commonly known as:  NORVASC Take 1 tablet (5 mg total) by mouth daily.   aspirin 81 MG tablet Take 81 mg by mouth daily.   atorvastatin 40 MG tablet Commonly known as:  LIPITOR TAKE 1 TABLET( 40 MG TOTAL) BY MOUTH EVERY EVENING What changed:    how much to take  how to take this  when to take this   finasteride 5 MG tablet Commonly known as:  PROSCAR Take 5 mg by mouth daily.   furosemide 40 MG tablet Commonly known as:  LASIX Take 20 mg by mouth daily.   glimepiride 4 MG  tablet Commonly known as:  AMARYL Take 4 mg by mouth daily with breakfast.   latanoprost 0.005 % ophthalmic solution Commonly known as:  XALATAN Place 1 drop into both eyes at bedtime.   metFORMIN 500 MG tablet Commonly known as:  GLUCOPHAGE Take 1 tablet (500 mg total) by mouth 3 (three) times daily. What changed:    how much to take  when to take this   neomycin-bacitracin-polymyxin [phone] ointment Apply topically 4 (four) times daily.   nitroGLYCERIN 0.4 MG SL tablet Commonly known as:  NITROSTAT Place 0.4 mg under the tongue every 5 (five) minutes as needed for chest pain.   oxyCODONE 5 MG immediate release tablet Commonly known as:  Oxy IR/ROXICODONE Take 1 tablet (5 mg total) by mouth 2 (two) times daily as needed for severe pain or breakthrough pain.   polyethylene glycol packet Commonly known as:  MIRALAX / GLYCOLAX Take 17 g by mouth daily. What changed:    when to take this  reasons to take this   rivaroxaban 20 MG Tabs tablet Commonly known as:  XARELTO Take 1 tablet (20 mg total) by mouth daily with supper.   senna-docusate 8.6-50 MG tablet Commonly known as:  Senokot-S Take 2 tablets by mouth 2 (two) times daily.   traMADol 50 MG tablet Commonly known as:  ULTRAM Take 1-2 tablets (50-100 mg total) by mouth every 6 (six) hours as needed.      Follow-up Information  Armandina Gemma, MD. Schedule an appointment as soon as possible for a visit in 3 week(s).   Specialty:  General Surgery Contact information: [address] [place] [address] [phone]           [name], MD, [facility] Office: [phone]   Signed: Armandina Gemma 12/09/2018, 8:09 AM

## 2018-12-11 ENCOUNTER — Encounter (HOSPITAL_COMMUNITY): Payer: Self-pay | Admitting: Surgery

## 2018-12-13 DIAGNOSIS — E1169 Type 2 diabetes mellitus with other specified complication: Secondary | ICD-10-CM | POA: Diagnosis not present

## 2018-12-13 DIAGNOSIS — I10 Essential (primary) hypertension: Secondary | ICD-10-CM | POA: Diagnosis not present

## 2018-12-13 DIAGNOSIS — K8067 Calculus of gallbladder and bile duct with acute and chronic cholecystitis with obstruction: Secondary | ICD-10-CM | POA: Diagnosis not present

## 2018-12-13 DIAGNOSIS — F4321 Adjustment disorder with depressed mood: Secondary | ICD-10-CM | POA: Diagnosis not present

## 2019-01-10 DIAGNOSIS — K8000 Calculus of gallbladder with acute cholecystitis without obstruction: Secondary | ICD-10-CM | POA: Diagnosis not present

## 2019-01-10 DIAGNOSIS — Z434 Encounter for attention to other artificial openings of digestive tract: Secondary | ICD-10-CM | POA: Diagnosis not present

## 2019-01-11 DIAGNOSIS — Z20828 Contact with and (suspected) exposure to other viral communicable diseases: Secondary | ICD-10-CM | POA: Diagnosis not present

## 2019-01-26 ENCOUNTER — Ambulatory Visit (INDEPENDENT_AMBULATORY_CARE_PROVIDER_SITE_OTHER): Payer: Medicare Other | Admitting: Internal Medicine

## 2019-01-26 ENCOUNTER — Other Ambulatory Visit: Payer: Self-pay

## 2019-01-26 ENCOUNTER — Ambulatory Visit (INDEPENDENT_AMBULATORY_CARE_PROVIDER_SITE_OTHER): Payer: Medicare Other

## 2019-01-26 ENCOUNTER — Encounter: Payer: Self-pay | Admitting: Internal Medicine

## 2019-01-26 VITALS — BP 126/72 | HR 80 | Temp 98.4°F | Ht 71.0 in | Wt 258.0 lb

## 2019-01-26 DIAGNOSIS — I2609 Other pulmonary embolism with acute cor pulmonale: Secondary | ICD-10-CM | POA: Diagnosis not present

## 2019-01-26 DIAGNOSIS — G4733 Obstructive sleep apnea (adult) (pediatric): Secondary | ICD-10-CM

## 2019-01-26 DIAGNOSIS — J449 Chronic obstructive pulmonary disease, unspecified: Secondary | ICD-10-CM

## 2019-01-26 MED ORDER — UMECLIDINIUM-VILANTEROL 62.5-25 MCG/INH IN AEPB: 1.0000 | INHALATION_SPRAY | Freq: Every day | RESPIRATORY_TRACT | 0 refills | Status: DC

## 2019-01-26 NOTE — Patient Instructions (Signed)
We can continue CPAP auto 5-20, mask of choice, humidifier, supplies, airView card  Our goal, to get the best medical help from CPAP, is to use it all night, every night and any time you sleep. Try to use it at least 4 hours per night.  Order- CXR  Dx COPD mixed type  Sample Anoro inhaler    inhale 1 puff, once every day    The sample will last a week. See if you find you are breathing more easily with it.  If you like it, please let us know and we can send you a prescription

## 2019-01-26 NOTE — Progress Notes (Signed)
Patient seen in the office today and instructed on use of Anoro.  Patient expressed understanding and demonstrated technique. 

## 2019-01-26 NOTE — Progress Notes (Signed)
HPI male former smoker followed for OSA, history PE/ Xarelto, COPD, complicated by allergic rhinitis, DM 2, A. Fib/amiodarone, glaucoma He had smoked one pack per day for 30 years, quitting in June of 2012. Office PFT 11/05/2002 had recorded FVC 3500 (74%) FEV1 2800 (75%) FEV1/FVC 0.80 with no response to bronchodilator. OSA- NPSG 03/17/98- AHI 32/hr; weight was 270 lbs. he has worn CPAP with variable long-term compliance and control. He says sometimes the mask is "bothersome" because of postnasal drainage and he is evasive about whether he has been using it recently. Pressure had been set at 14. PFT-08/09/2011-mild obstructive airways disease with insignificant response to bronchodilator, mild restriction, mild reduction of DLCO FEV1/FVC 0.65, DLCO 69%. TLC 71%. 6 minute walk test 08/09/2011-96% on room air to start, falling to 88% on room air by the end of his walk. Blood pressure rose to 170/110, pulse 112. After 2 minutes recovery oxygen saturation on room air at rest was 94%. He walked 11 m CT chest 07/01/12-Positive study for bilateral central and segmental pulmonary  emboli.  PFT 07/22/17-moderate obstructive airways disease, insignificant response -----------------------------------------------------------------------------------------------------  01/20/2018- 73 year old male former smoker followed for COPD, history PE/ Xarelto, OSA, complicated by allergic rhinitis, DM 2, A. Fib/amiodarone, HBP, glaucoma CPAP auto 5-20/ [facility] ----OSA: DME: [facility]. Pt wears CPAP nightly for almost 6 hours. AHI of 0.5. DL attached. No new supplies needed at this time. Pt has slight congestion-sinus; believes this is due to CPAP not being cleaned. He sleeps very well with his CPAP and likes it-sleeps better and prevent snoring.  We discussed cleaning- SoClean, etc. Download 87% compliance AHI 0.5/hour.  Pressure range 13.2-17.4 with moderate leak which does not bother him.  Nasal pillows  mask.  01/26/2019- 73 year old male former smoker followed for COPD, history PE/ Xarelto, OSA, complicated by allergic rhinitis, DM 2, A. Fib/amiodarone, HBP, glaucoma CPAP auto 5-20/ [facility] Download 53%, AHI 0.4/ hr -----OSA on CPAP; DME: [facility], pt states CPAP is going well for him, using it every night; no issues w/ pressure settings or mask; DL attached Xarelto, Amiodarone,  I reviewed download with him, documenting only 53% compliance with goals, but good control. He is getting an Clinical research associate. Feels well now, with no acute illness during the winter. Denies wheeze, cough, dyspnea.Marland Kitchen  ROS-see HPI   + = positive Constitutional:   No-   weight loss, night sweats, fevers, chills, fatigue, lassitude. HEENT:   No-  headaches, difficulty swallowing, tooth/dental problems, sore throat,       No-  sneezing, itching, ear ache, +nasal congestion, post nasal drip,  CV:  No-   chest pain, orthopnea, PND, swelling in lower extremities, anasarca, dizziness, palpitations Resp:  shortness of breath with exertion or at rest.              No-  productive cough,   non-productive cough,  No- coughing up of blood.              No-   change in color of mucus.  No- wheezing.   Skin: No-   rash or lesions. GI:  No-   heartburn, indigestion, abdominal pain, nausea, vomiting,  GU: . MS:  No-   joint pain or swelling.  . Neuro-     nothing unusual Psych:  No- change in mood or affect. No depression or anxiety.  No memory loss.  OBJ- Physical Exam General- Alert, Oriented, Affect-appropriate, Distress- none acute, overweight,  + obese Skin- rash-none, lesions- none, excoriation- none Lymphadenopathy- none Head- atraumatic  Eyes- Gross vision intact, PERRLA, conjunctivae and secretions clear            Ears- Hearing, canals-normal            Nose- clear, no-Septal dev, mucus, polyps, erosion, perforation             Throat- Mallampati III , mucosa clear , drainage- none, tonsils- atrophic,   + own teeth Neck- flexible , trachea midline, no stridor , thyroid nl, carotid no bruit Chest - symmetrical excursion , unlabored           Heart/CV- RR at this visit , no murmur , no gallop  , no rub, nl s1 s2                           - JVD- none , edema- none, stasis changes- none, varices- none           Lung- wheeze+ mild, cough- none , dullness-none, rub- none           Chest wall-  Abd-  Br/ Gen/ Rectal- Not done, not indicated Extrem- cyanosis- none, clubbing, none, atrophy- none, strength- nl Neuro- grossly intact to observation

## 2019-01-30 ENCOUNTER — Telehealth: Payer: Self-pay | Admitting: Internal Medicine

## 2019-01-30 NOTE — Telephone Encounter (Signed)
Call returned to patient, requesting results of his CXR.   Notes recorded by Deneise Lever, MD on 01/26/2019 at 4:19 PM EDT CXR- lungs are stable and clear with no active process seen.   Made aware of results. Voiced understanding. Nothing further is needed at this time.

## 2019-02-05 NOTE — Assessment & Plan Note (Signed)
Chronic anticoagulation w/o bleeding

## 2019-02-05 NOTE — Assessment & Plan Note (Signed)
He doesn't notice wheeze.  Plan- sample trial Anoro, CXR

## 2019-02-05 NOTE — Assessment & Plan Note (Signed)
He benefits from CPAP but needs better compliance. Education done. Plan- continue CPAP auto 5-20

## 2019-04-27 ENCOUNTER — Ambulatory Visit
Admission: RE | Admit: 2019-04-27 | Discharge: 2019-04-27 | Disposition: A | Discharge status: Home / Self Care | Payer: Medicare Other | Source: Ambulatory Visit | Attending: Internal Medicine | Admitting: Internal Medicine

## 2019-04-27 ENCOUNTER — Other Ambulatory Visit: Payer: Self-pay | Admitting: Internal Medicine

## 2019-04-27 DIAGNOSIS — R1032 Left lower quadrant pain: Secondary | ICD-10-CM | POA: Diagnosis not present

## 2019-05-04 DIAGNOSIS — H40023 Open angle with borderline findings, high risk, bilateral: Secondary | ICD-10-CM | POA: Diagnosis not present

## 2019-05-07 DIAGNOSIS — Z125 Encounter for screening for malignant neoplasm of prostate: Secondary | ICD-10-CM | POA: Diagnosis not present

## 2019-05-09 ENCOUNTER — Encounter: Payer: Self-pay | Admitting: Gastroenterology

## 2019-05-11 DIAGNOSIS — G4733 Obstructive sleep apnea (adult) (pediatric): Secondary | ICD-10-CM | POA: Diagnosis not present

## 2019-05-11 DIAGNOSIS — E785 Hyperlipidemia, unspecified: Secondary | ICD-10-CM | POA: Diagnosis not present

## 2019-05-11 DIAGNOSIS — I10 Essential (primary) hypertension: Secondary | ICD-10-CM | POA: Diagnosis not present

## 2019-05-11 DIAGNOSIS — E119 Type 2 diabetes mellitus without complications: Secondary | ICD-10-CM | POA: Diagnosis not present

## 2019-05-11 DIAGNOSIS — S83242A Other tear of medial meniscus, current injury, left knee, initial encounter: Secondary | ICD-10-CM | POA: Diagnosis not present

## 2019-05-11 DIAGNOSIS — I2699 Other pulmonary embolism without acute cor pulmonale: Secondary | ICD-10-CM | POA: Diagnosis not present

## 2019-05-11 DIAGNOSIS — I471 Supraventricular tachycardia: Secondary | ICD-10-CM | POA: Diagnosis not present

## 2019-05-11 DIAGNOSIS — M1990 Unspecified osteoarthritis, unspecified site: Secondary | ICD-10-CM | POA: Diagnosis not present

## 2019-05-16 DIAGNOSIS — E119 Type 2 diabetes mellitus without complications: Secondary | ICD-10-CM | POA: Diagnosis not present

## 2019-05-16 DIAGNOSIS — I10 Essential (primary) hypertension: Secondary | ICD-10-CM | POA: Diagnosis not present

## 2019-05-16 DIAGNOSIS — E785 Hyperlipidemia, unspecified: Secondary | ICD-10-CM | POA: Diagnosis not present

## 2019-05-21 DIAGNOSIS — N401 Enlarged prostate with lower urinary tract symptoms: Secondary | ICD-10-CM | POA: Diagnosis not present

## 2019-05-21 DIAGNOSIS — R351 Nocturia: Secondary | ICD-10-CM | POA: Diagnosis not present

## 2019-05-27 IMAGING — DX DG CHEST 1V
1 series · 1 of 1 positions shown · non-contrast
Comparison: Single-view of the chest 09/11/2018. PA and lateral
chest 10/25/2015. CT chest 10/25/2015.

CLINICAL DATA: Elevated white blood cell count today.

EXAM:
CHEST  1 VIEW

[chest ap]
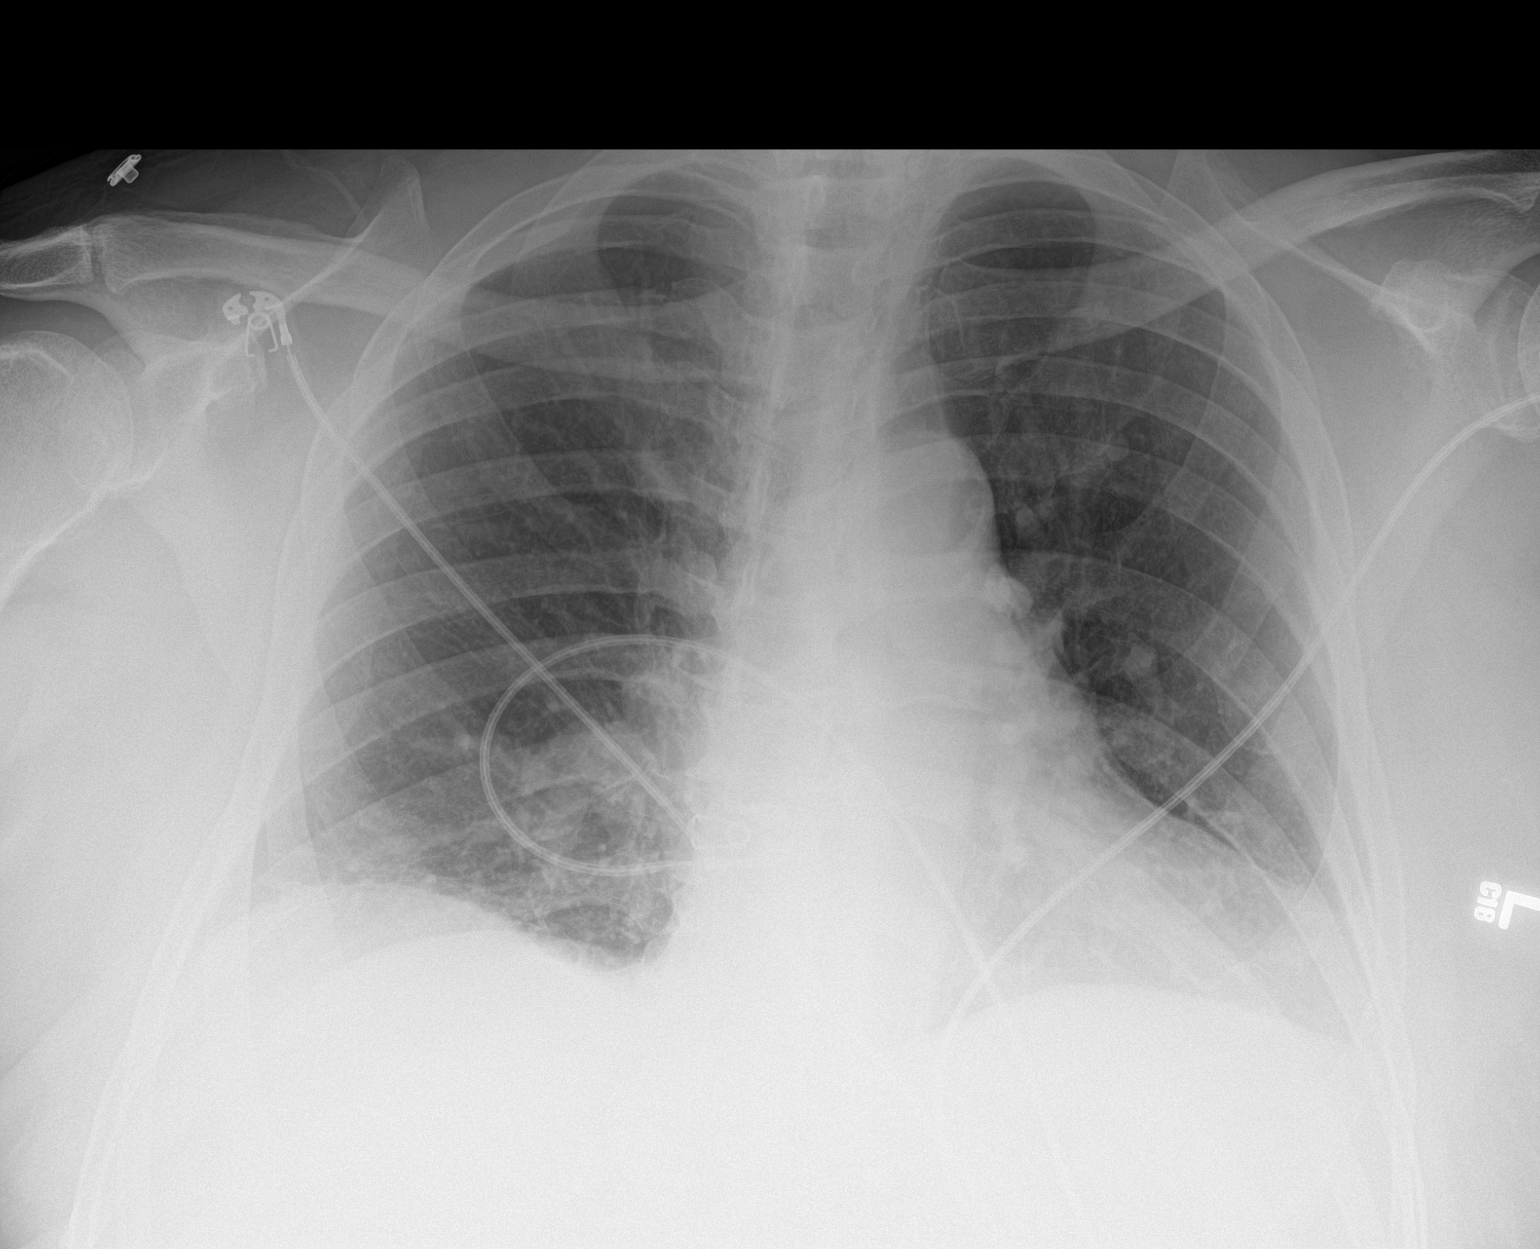

[1 of 1 positions shown; findings below may reference images not displayed]

FINDINGS: The lungs are clear. Heart size is upper normal. No pneumothorax or
pleural effusion. Aortic atherosclerosis is noted. No acute or focal
bony abnormality.
IMPRESSION: No acute disease.

Atherosclerosis.

## 2019-06-06 DIAGNOSIS — E7800 Pure hypercholesterolemia, unspecified: Secondary | ICD-10-CM | POA: Diagnosis not present

## 2019-06-06 DIAGNOSIS — I509 Heart failure, unspecified: Secondary | ICD-10-CM | POA: Diagnosis not present

## 2019-06-06 DIAGNOSIS — N400 Enlarged prostate without lower urinary tract symptoms: Secondary | ICD-10-CM | POA: Diagnosis not present

## 2019-06-06 DIAGNOSIS — I4891 Unspecified atrial fibrillation: Secondary | ICD-10-CM | POA: Diagnosis not present

## 2019-06-06 DIAGNOSIS — I480 Paroxysmal atrial fibrillation: Secondary | ICD-10-CM | POA: Diagnosis not present

## 2019-06-06 DIAGNOSIS — E1169 Type 2 diabetes mellitus with other specified complication: Secondary | ICD-10-CM | POA: Diagnosis not present

## 2019-06-06 DIAGNOSIS — E119 Type 2 diabetes mellitus without complications: Secondary | ICD-10-CM | POA: Diagnosis not present

## 2019-06-06 DIAGNOSIS — F4321 Adjustment disorder with depressed mood: Secondary | ICD-10-CM | POA: Diagnosis not present

## 2019-06-06 DIAGNOSIS — I10 Essential (primary) hypertension: Secondary | ICD-10-CM | POA: Diagnosis not present

## 2019-06-20 ENCOUNTER — Encounter: Payer: Self-pay | Admitting: Gastroenterology

## 2019-06-20 ENCOUNTER — Ambulatory Visit (INDEPENDENT_AMBULATORY_CARE_PROVIDER_SITE_OTHER): Payer: Medicare Other | Admitting: Gastroenterology

## 2019-06-20 VITALS — BP 138/68 | HR 64 | Temp 97.8°F | Ht 71.0 in | Wt 264.6 lb

## 2019-06-20 DIAGNOSIS — Z86010 Personal history of colonic polyps: Secondary | ICD-10-CM | POA: Diagnosis not present

## 2019-06-20 DIAGNOSIS — Z1159 Encounter for screening for other viral diseases: Secondary | ICD-10-CM | POA: Diagnosis not present

## 2019-06-20 DIAGNOSIS — Z7901 Long term (current) use of anticoagulants: Secondary | ICD-10-CM

## 2019-06-20 DIAGNOSIS — Z6836 Body mass index (BMI) 36.0-36.9, adult: Secondary | ICD-10-CM

## 2019-06-20 MED ORDER — NA SULFATE-K SULFATE-MG SULF 17.5-3.13-1.6 GM/177ML PO SOLN: 1.0000 | Freq: Once | ORAL | 0 refills | Status: AC

## 2019-06-20 NOTE — Progress Notes (Addendum)
[facility] Consult Note:  History: Matthew Ochoa. 06/20/2019  Referring provider: Seward Carol, MD  Reason for consult/chief complaint: History of colon polyps  Subjective  HPI:  Colonoscopy with Dr. Deatra Ochoa February 2011, adenomatous and hyperplastic polyps, poor preparation Colonoscopy January 2015 with hepatic flexure adenomatous polyps and poor preparation.  Exam noted to be difficult due to poor prep and redundant colon.  On chronic [place] for pulmonary embolism diagnosed in 2013 (most recent pulmonary clinic note reviewed)  Matthew Ochoa denies abdominal pain or rectal bleeding.  He tends toward constipation, takes occasional MiraLAX.  He was surprised to hear that his bowel prep was not good the last 2 times.  Also reports he saw his cardiologist, Dr. Terrence Ochoa within the last few months.  On direct questioning, thinks that he may have had an echocardiogram within the last year.  ROS:  Review of Systems  Constitutional: Positive for fatigue. Negative for appetite change and unexpected weight change.  HENT: Negative for mouth sores and voice change.   Eyes: Negative for pain and redness.  Respiratory: Negative for cough and shortness of breath.   Cardiovascular: Positive for leg swelling. Negative for chest pain and palpitations.  Genitourinary: Negative for dysuria and hematuria.  Musculoskeletal: Positive for arthralgias. Negative for myalgias.  Skin: Negative for pallor and rash.  Neurological: Negative for weakness and headaches.  Hematological: Negative for adenopathy.     Past Medical History: Past Medical History:  Diagnosis Date  . Abdominal pain, right upper quadrant   . Allergic rhinitis   . Asthma    as child  . Atrial enlargement, left y-2  . Benign localized hyperplasia of prostate with urinary obstruction and other lower urinary tract symptoms (LUTS)(600.21)   . Calculus of gallbladder without mention of cholecystitis or obstruction    . CHF (congestive heart failure) ([place])    managed by Dr Matthew Ochoa   . COPD (chronic obstructive pulmonary disease) ([place])   . Diabetes mellitus   . DVT (deep venous thrombosis) ([place])   . Hematuria, unspecified   . HTN (hypertension)   . Hyperlipidemia   . Hypertension   . Pulmonary embolism ([place])    on xarelto   . Sleep apnea    NPSG 03-17-98, cpap 14  . Type 2 diabetes mellitus ([place])      Past Surgical History: Past Surgical History:  Procedure Laterality Date  . ANGIOPLASTY  2011 or 2012   stent  . CHOLECYSTECTOMY N/A 12/08/2018   Procedure: LAPAROSCOPIC CHOLECYSTECTOMY WITH INTRAOPERATIVE CHOLANGIOGRAM;  Surgeon: Matthew Gemma, MD;  Location: [facility];  Service: General;  Laterality: N/A;  . COLONOSCOPY    . CORONARY ANGIOPLASTY WITH STENT PLACEMENT    . IR PERC CHOLECYSTOSTOMY  09/15/2018  . IR RADIOLOGIST EVAL & MGMT  10/10/2018  . POLYPECTOMY       Family History: Family History  Problem Relation Age of Onset  . Lung cancer Father   . Asthma Sister   . Breast cancer Sister   . Colon cancer Neg Hx   . Rectal cancer Neg Hx   . Stomach cancer Neg Hx     Social History: Social History   Socioeconomic History  . Marital status: Married    Spouse name: Not on file  . Number of children: 1  . Years of education: Not on file  . Highest education level: Not on file  Occupational History  . Occupation: retired  Scientific laboratory technician  . Financial resource strain: Not on file  .  Food insecurity    Worry: Not on file    Inability: Not on file  . Transportation needs    Medical: Not on file    Non-medical: Not on file  Tobacco Use  . Smoking status: Former Smoker    Packs/day: 2.00    Years: 30.00    Pack years: 60.00    Types: Cigarettes    Quit date: 04/13/2011    Years since quitting: 8.1  . Smokeless tobacco: Never Used  Substance and Sexual Activity  . Alcohol use: Yes    Comment: occ   . Drug use: No  . Sexual activity: Yes  Lifestyle  . Physical activity    Days  per week: Not on file    Minutes per session: Not on file  . Stress: Not on file  Relationships  . Social Herbalist on phone: Not on file    Gets together: Not on file    Attends religious service: Not on file    Active member of club or organization: Not on file    Attends meetings of clubs or organizations: Not on file    Relationship status: Not on file  Other Topics Concern  . Not on file  Social History Narrative  . Not on file    Allergies: Allergies  Allergen Reactions  . Lisinopril Cough    Outpatient Meds: Current Outpatient Medications  Medication Sig Dispense Refill  . acetaminophen (TYLENOL) 325 MG tablet Take 325-650 mg by mouth every 6 (six) hours as needed for mild pain or headache.    Marland Kitchen amiodarone (PACERONE) 200 MG tablet Take 100 mg by mouth every Monday, Wednesday, and Friday.   2  . amLODipine (NORVASC) 5 MG tablet Take 1 tablet (5 mg total) by mouth daily. 30 tablet 2  . atorvastatin (LIPITOR) 40 MG tablet TAKE 1 TABLET( 40 MG TOTAL) BY MOUTH EVERY EVENING (Patient taking differently: Take 40 mg by mouth daily at 6 PM. TAKE 1 TABLET( 40 MG TOTAL) BY MOUTH EVERY EVENING) 30 tablet 3  . finasteride (PROSCAR) 5 MG tablet Take 5 mg by mouth daily.   5  . furosemide (LASIX) 40 MG tablet Take 20 mg by mouth daily.   3  . glimepiride (AMARYL) 4 MG tablet Take 4 mg by mouth daily with breakfast.   8  . latanoprost (XALATAN) 0.005 % ophthalmic solution Place 1 drop into both eyes at bedtime.    . metFORMIN (GLUCOPHAGE) 500 MG tablet Take 1 tablet (500 mg total) by mouth 3 (three) times daily. (Patient taking differently: Take 1,000 mg by mouth 2 (two) times daily with a meal. ) 90 tablet 3  . nitroGLYCERIN (NITROSTAT) 0.4 MG SL tablet Place 0.4 mg under the tongue every 5 (five) minutes as needed for chest pain.     . polyethylene glycol (MIRALAX / GLYCOLAX) packet Take 17 g by mouth daily. (Patient taking differently: Take 17 g by mouth daily as needed for  moderate constipation. ) 14 each 0  . rivaroxaban (XARELTO) 20 MG TABS tablet Take 1 tablet (20 mg total) by mouth daily with supper. (Patient taking differently: Take 20 mg by mouth daily with supper. ) 30 tablet 5  . Na Sulfate-K Sulfate-Mg Sulf 17.5-3.13-1.6 GM/177ML SOLN Take 1 kit by mouth once for 1 dose. 354 mL 0   No current facility-administered medications for this visit.       ___________________________________________________________________ Objective   Exam:  BP 138/68   Pulse 64  Temp 97.8 F (36.6 C)   Ht 5' 11"  (1.803 m)   Wt 264 lb 9.6 oz (120 kg)   BMI 36.90 kg/m      Eyes: sclera anicteric, no redness  ENT: oral mucosa moist without lesions, no cervical or supraclavicular lymphadenopathy  CV: RRR without murmur, S1/S2, no JVD, mild bilateral pretibial edema  Resp: clear to auscultation bilaterally, normal RR and effort noted  GI: soft, obese, no tenderness, with active bowel sounds. No guarding or palpable organomegaly noted, by body habitus skin; warm and dry, no rash or jaundice noted  Neuro: awake, alert and oriented x 3. Normal gross motor function and fluent speech   Assessment: Encounter Diagnoses  Name Primary?  . Personal history of colonic polyps Yes  . Current use of long term anticoagulation   . Class 2 severe obesity with serious comorbidity and body mass index (BMI) of 36.0 to 36.9 in adult, unspecified obesity type ([place])   . Screening for viral disease     Due for surveillance colonoscopy, especially given poor prep on prior exams. Multiple medical issues such as obesity, sleep apnea, history of pulmonary embolism and chronic use of oral anticoagulation increasing risk of procedure.  The benefits and risks of the planned procedure were described in detail with the patient or (when appropriate) their health care proxy.  Risks were outlined as including, but not limited to, bleeding, infection, perforation, adverse medication reaction  leading to cardiac or pulmonary decompensation, pancreatitis (if ERCP).  The limitation of incomplete mucosal visualization was also discussed.  No guarantees or warranties were given.  He was agreeable after discussion of procedure and risks.  We will contact his cardiologist for clearance on holding [place] 2 days prior, and to get copy of last echocardiogram report to be sure he is able to have his procedure in our outpatient center.  I also explained the need for 2-day extended bowel preparation, about which he was somewhat upset but ultimately agreeable.   Thank you for the courtesy of this consult.  Please call me with any questions or concerns.  Nelida Meuse III  CC: Referring provider noted above  Addendum 06/27/2019  Records from Dr. Terrence Ochoa include last echocardiogram dated October 2013 showing LVEF 50 to 55% with moderate left atrial dilation. Dr. Terrence Ochoa also agreed to have patient stop Xarelto 3 days prior to procedure, but we will instruct patient to take his last dose 2 days prior to the procedure.   Wilfrid Lund, MD    [facility]

## 2019-06-20 NOTE — Patient Instructions (Addendum)
If you are age 72 or older, your body mass index should be between 23-30. Your Body mass index is 36.9 kg/m. If this is out of the aforementioned range listed, please consider follow up with your Primary Care Provider.  If you are age 63 or younger, your body mass index should be between 19-25. Your Body mass index is 36.9 kg/m. If this is out of the aformentioned range listed, please consider follow up with your Primary Care Provider.   You have been scheduled for a colonoscopy. Please follow written instructions given to you at your visit today.  Please pick up your prep supplies at the pharmacy within the next 1-3 days. If you use inhalers (even only as needed), please bring them with you on the day of your procedure. You will be contacted by our office prior to your procedure for directions on holding your XARELTO.  If you do not hear from our office 1 week prior to your scheduled procedure, please call [phone] to discuss.   Due to recent COVID-19 restrictions implemented by [employer] and state authorities and in an effort to keep both patients and staff as safe as possible, [facility] requires COVID-19 testing prior to any scheduled endoscopic procedure. The testing center is located at [address] [place], [place] [address] in the [facility] suite.  Your appointment has been scheduled for 08-06-19 at 10am.   Please bring your insurance cards to this appointment. You will require your COVID screen 2 business days prior to your endoscopic procedure.  You are not required to quarantine after your screening.  You will only receive a phone call with the results if it is POSITIVE.  If you do not receive a call the day before your procedure you should begin your prep, if ordered, and you should report to the endo center for your procedure at your designated appointment arrival time ( one hour prior to the procedure time). There is no cost  to you for the screening on the day of the swab.  [facility] will file with your insurance company for the testing.    You may receive an automated phone call prior to your procedure or have a message in your MyChart that you have an appointment for a BP/15 at the [facility], please disregard this message.  Your testing will be at the [address] , [facility] location.   It was a pleasure to see you today!  Dr. Loletha Carrow

## 2019-06-21 DIAGNOSIS — Z23 Encounter for immunization: Secondary | ICD-10-CM | POA: Diagnosis not present

## 2019-06-23 IMAGING — RF DG CHOLANGIOGRAM VIA EXIST CATHETER
2 series · 9 of 9 positions shown · non-contrast
Comparison: Image guided cholecystostomy tube placement-09/15/2018

INDICATION: History of acute cholecystitis, post ultrasound fluoroscopic guided
cholecystostomy tube placement on 09/15/2018 by Ronlor. Laura Sell Page Rubin.

[Series 1: sequence · 4 of 56 frames shown]
[frame 9/56]
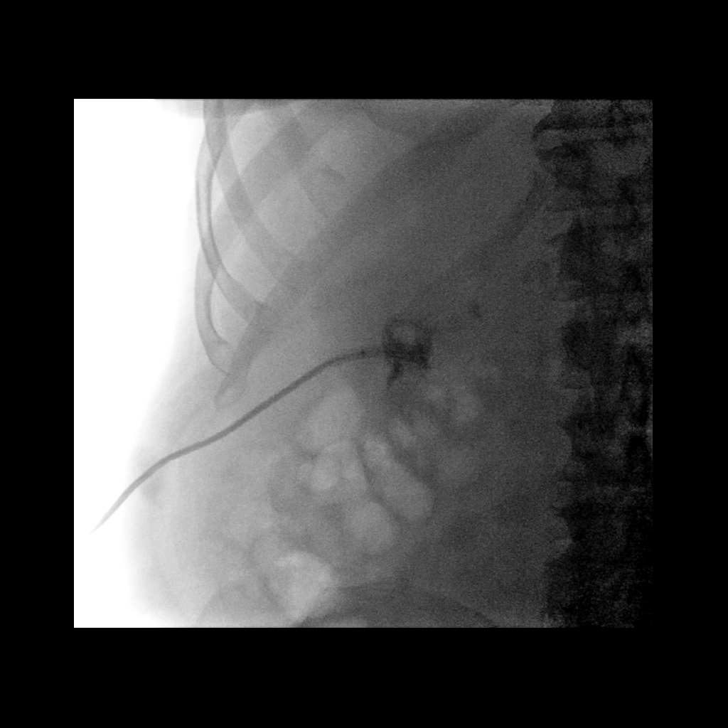
[frame 29/56]
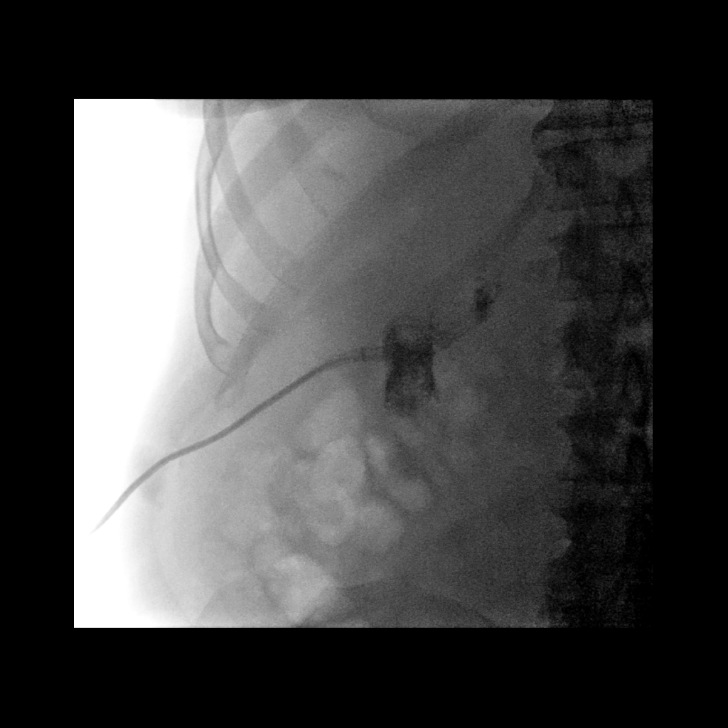
[frame 38/56]
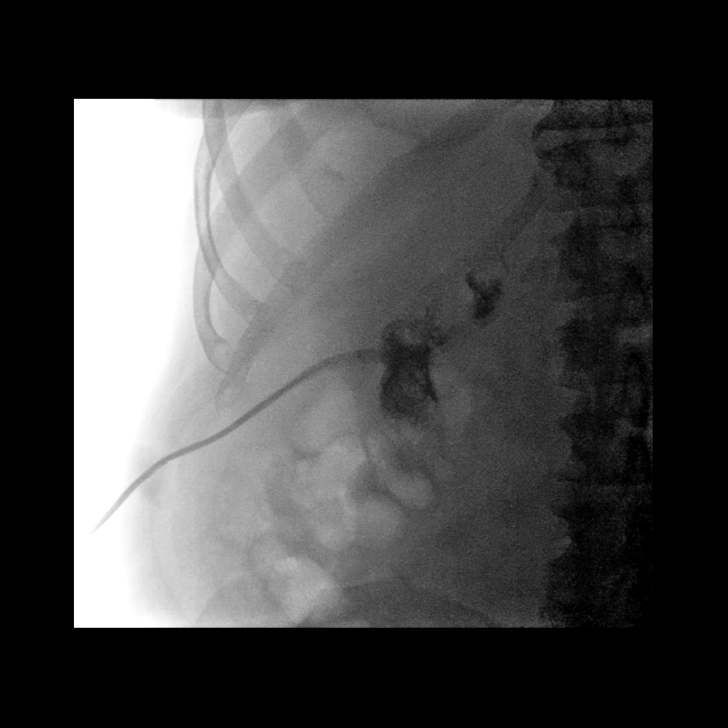
[frame 48/56]
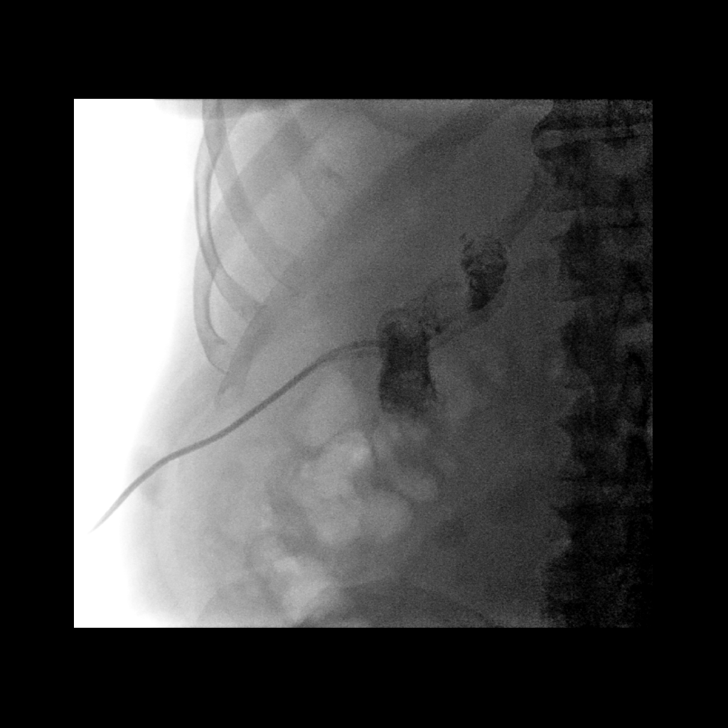

[Series 2: one shot · 5 of 5 slices shown]
[im 1/5]
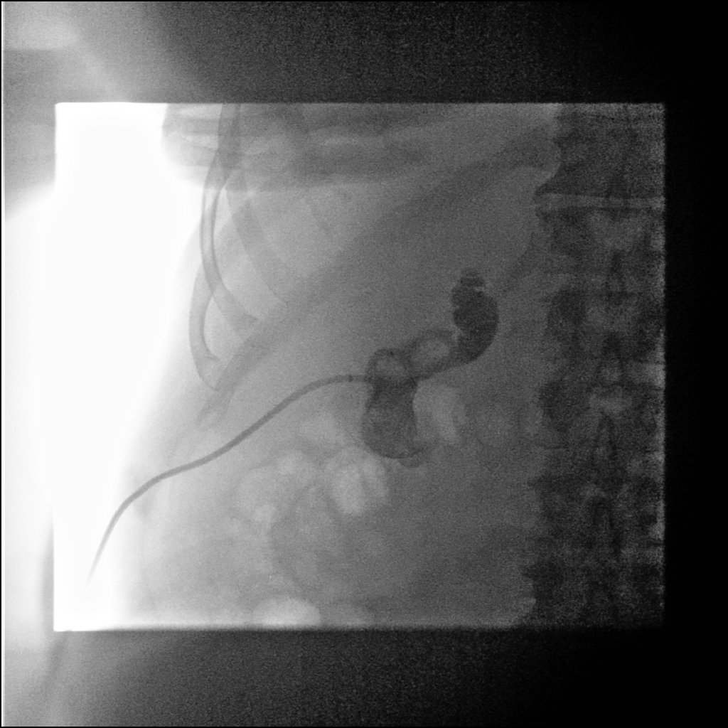
[im 2/5]
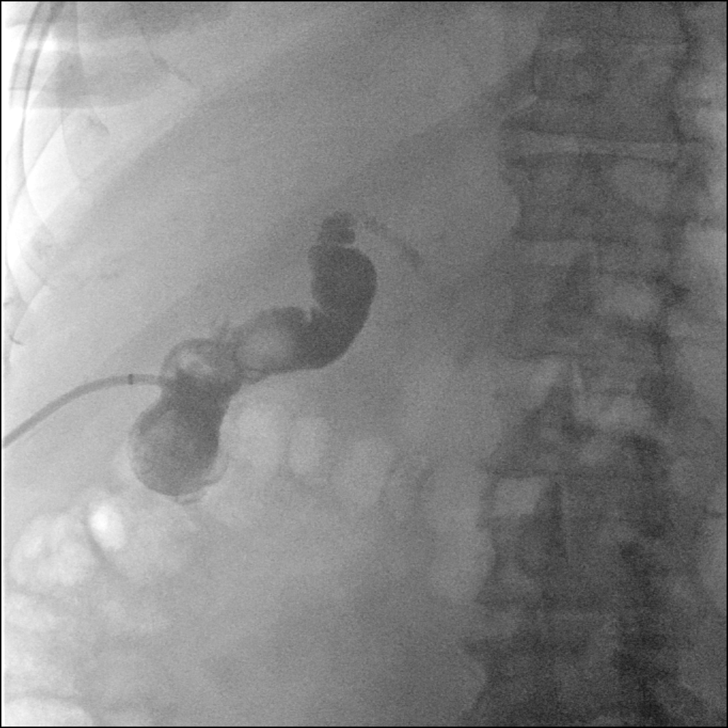
[im 3/5]
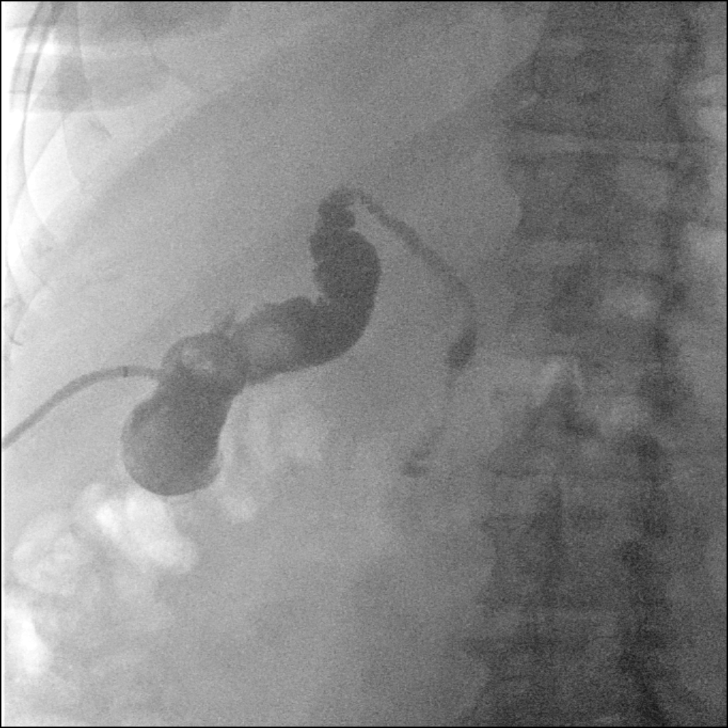
[im 4/5]
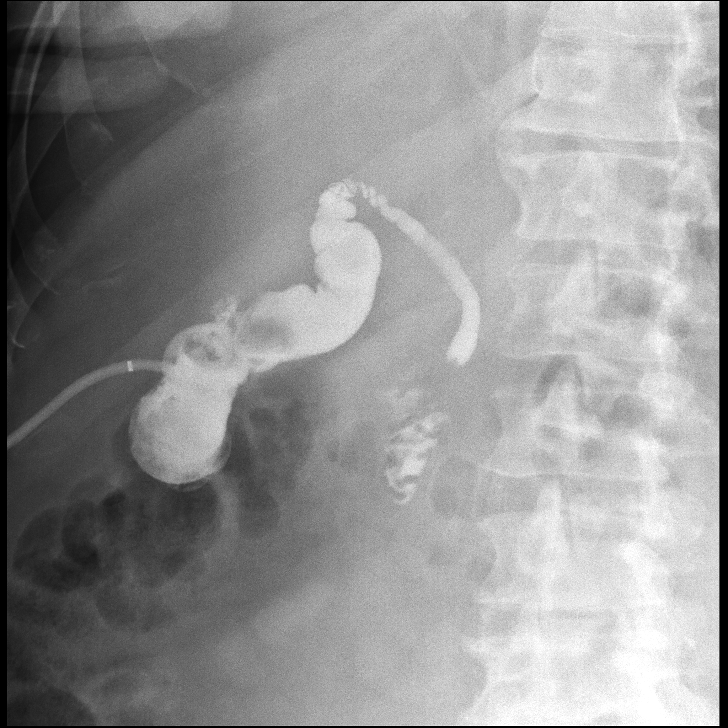
[im 5/5]
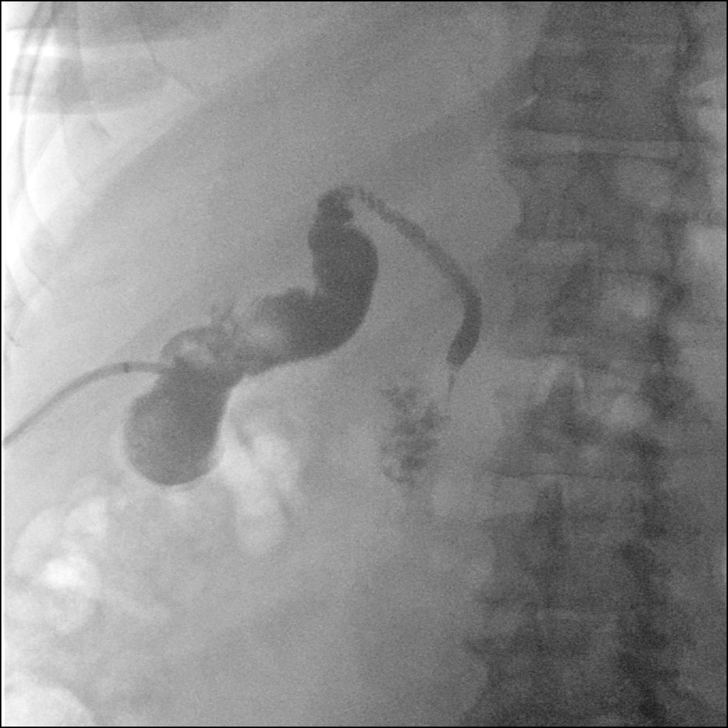

[9 of 9 positions shown; findings below may reference images not displayed]

While the patient would want is cholecystostomy tube removed, he is
tolerating the tube well without recurrence of right upper quadrant
abdominal pain. Patient denies fever or chills.

Patient reports inconsistent output from the cholecystostomy tube
ranging between 50 - to 100 cc per day.

EXAM:
FLUOROSCOPIC GUIDED CHOLECYSTOSTOMY TUBE INJECTION
MEDICATIONS:
None

ANESTHESIA/SEDATION:
None

CONTRAST:  30 cc Omnipaque 300 - administered into the gallbladder
lumen.

FLUOROSCOPY TIME:  1 minute (117 mGy)

COMPLICATIONS:
None immediate.

PROCEDURE:
The patient was positioned supine on the fluoroscopy table.

A preprocedural spot fluoroscopic image was obtained of the right
upper abdominal quadrant existing cholecystostomy tube.

Multiple spot fluoroscopic radiographic images were obtained of the
right upper abdominal quadrant an existing cholecystostomy tube
following injection of a small amount of contrast. Images were
reviewed and discussed with the patient.

The cholecystostomy tube was flushed with a small amount of saline
and capped. A dressing was placed. The patient tolerated the
procedure well without immediate postprocedural complication.
FINDINGS: Preprocedural spot fluoroscopic image of the right upper abdominal
quadrant demonstrates grossly unchanged positioning of the
cholecystostomy tube with end coiled and locked overlying the
expected location of the gallbladder fundus.

Subsequent contrast injection demonstrates appropriate functionality
of the cholecystostomy tube with brisk opacification of the
gallbladder. There is passage of contrast from the gallbladder
through the cystic and common bile ducts the level duodenum.

Multiple filling defects are seen within the gallbladder lumen
compatible with known choledocholithiasis.

No discrete internal filling defects are seen within biliary tree to
suggest the presence of choledocholithiasis.
IMPRESSION: 1. Appropriately positioned and functioning cholecystostomy tube. No
exchange performed.
2. Extensive cholelithiasis without evidence of choledocholithiasis.

PLAN:
The patient's cholecystostomy tube was capped for a trial of
internalization. The patient was instructed to no longer flush the
cholecystostomy tube.

The patient was given an extra gravity bag and instructed to
reconnect the cholecystostomy tube to the gravity bag if she were to
experience recurrent right upper quadrant obstructive symptoms. The
patient demonstrated fair understanding of this discussion.

Per my discussion with referring surgeon, Ronlor. Gutesa, the patient is
an operative candidate and given his extensive cholelithiasis, I
would recommend maintaining the cholecystostomy tube until the time
of definitive operative repair.

Note, the patient states that his wife is ill and she is seeing a
medical specialist early next month.

If the patient's cholecystostomy is delayed, would recommend
fluoroscopic guided exchange of the cholecystostomy tube 2 months
following the initial placement (mid/[REDACTED]).

## 2019-06-27 ENCOUNTER — Telehealth: Payer: Self-pay

## 2019-06-27 NOTE — Telephone Encounter (Signed)
-----   Message from McAdoo, MD sent at 06/27/2019 12:59 PM EDT ----- I received records from Dr. Terrence Dupont, and made an addendum to this patient's note reflecting that.  His last LV ejection fraction on echocardiogram allows patient to have his procedure in the [place]. Dr. Terrence Dupont was also agreeable to holding Xarelto prior to procedure.  Please give patient usual instructions to take last dose 2 days prior to procedure.  - HD

## 2019-06-27 NOTE — Telephone Encounter (Signed)
Left message to return call 

## 2019-06-28 NOTE — Telephone Encounter (Signed)
Left message to return call 

## 2019-06-28 NOTE — Telephone Encounter (Signed)
Patient has been notified and aware to hold his Xarelto 2 days before his colonoscopy.

## 2019-08-06 ENCOUNTER — Ambulatory Visit (INDEPENDENT_AMBULATORY_CARE_PROVIDER_SITE_OTHER): Payer: Medicare Other

## 2019-08-06 ENCOUNTER — Other Ambulatory Visit: Payer: Self-pay | Admitting: Gastroenterology

## 2019-08-06 DIAGNOSIS — Z1159 Encounter for screening for other viral diseases: Secondary | ICD-10-CM

## 2019-08-07 LAB — SARS CORONAVIRUS 2 (TAT 6-24 HRS): SARS Coronavirus 2: NEGATIVE

## 2019-08-08 ENCOUNTER — Telehealth: Payer: Self-pay | Admitting: Gastroenterology

## 2019-08-08 NOTE — Telephone Encounter (Signed)
Spoke to the patient and gave the patient Dr. Loletha Carrow' recommendations. The patient will go to the drug store and get the magnesium citrate and will consume half the bottle then move the suprep time up to 7 pm and keep the 330 am dose. Verbalized understanding.

## 2019-08-08 NOTE — Telephone Encounter (Signed)
Take four more of the 5 mg ducolax tablets before noon today.  If no result from that, take 4 more at 3pm today and remainder of bowel prep this evening and tomorrow AM as ordered.

## 2019-08-08 NOTE — Telephone Encounter (Signed)
Spoke to the patient who is aware to take four more 5 mg Ducolax tablet prior to noon today and if he still has not had a bowel movement to take another four tablets at 3 pm. The patient aware to continue with prep as previous directed at 6 pm this evening and 3:30 am.

## 2019-08-08 NOTE — Telephone Encounter (Signed)
FYI-Dr. Loletha Carrow,   Spoke to the patient again who reported he has taken a total of twelve 5 mg Ducolax tablets (4 at the start of his 2 day prep, 4 at 11 am and 4 at 3 pm) and still has not had a BM. The patient ensure this RN that he was drinking more that adequate fluid and is maintaining diet restrictions.The patient was told to continue with his su-prep at 6 pm and 3:30 pm. Patient verbalized understanding. The call was to ensure Dr. Loletha Carrow was aware of the absence of any bowel movements since Monday despite laxative usage as directed.

## 2019-08-08 NOTE — Telephone Encounter (Signed)
FYI Dr. Loletha Carrow,  Spoke to the patient who called to report after starting 2 day prep (four 5mg  tablets of Ducolax [biscodyl] with water at 3pm and 119 g Miralax in 32 oz gatorade at 7pm) on 12/1, he has not produced a bowel movement as of 9 am this morning. The patient has been on clears since 7 pm yesterday and continues to follow the instructions for the prep as written. He reported with his last colonoscopy the prep was poor even though he prep per the instructions. He made the call to inform Dr. Loletha Carrow of this since he know he is "hard to clean out."

## 2019-08-08 NOTE — Telephone Encounter (Signed)
That is surprising.  Please have him go to his local pharmacy and get a bottle of magnesium citrate.  Consume half the bottle when he gets home.  Then move the evening start time for Suprep to 7pm, keep 330 AM time for 2nd Suprep dose.

## 2019-08-08 NOTE — Telephone Encounter (Signed)
Pt said he has took a total of 12 Ducolax today and still NO BM.  Wants to know what to do

## 2019-08-09 ENCOUNTER — Other Ambulatory Visit: Payer: Self-pay

## 2019-08-09 ENCOUNTER — Ambulatory Visit (AMBULATORY_SURGERY_CENTER): Payer: Medicare Other | Admitting: Gastroenterology

## 2019-08-09 ENCOUNTER — Encounter: Payer: Self-pay | Admitting: Gastroenterology

## 2019-08-09 VITALS — BP 135/77 | HR 48 | Temp 97.9°F | Resp 18 | Ht 71.0 in | Wt 264.0 lb

## 2019-08-09 DIAGNOSIS — D123 Benign neoplasm of transverse colon: Secondary | ICD-10-CM

## 2019-08-09 DIAGNOSIS — Z86010 Personal history of colonic polyps: Secondary | ICD-10-CM

## 2019-08-09 DIAGNOSIS — Z1211 Encounter for screening for malignant neoplasm of colon: Secondary | ICD-10-CM | POA: Diagnosis not present

## 2019-08-09 MED ORDER — SODIUM CHLORIDE 0.9 % IV SOLN: 500.0000 mL | Freq: Once | INTRAVENOUS | Status: DC

## 2019-08-09 NOTE — Progress Notes (Signed)
A and O x3. Report to RN. Tolerated MAC anesthesia well.

## 2019-08-09 NOTE — Progress Notes (Signed)
History reviewed today  Temp JB VS CW

## 2019-08-09 NOTE — Patient Instructions (Signed)
     Handouts given:  Diverticulosis and polyps REsume Xaralto tomorrow at previous dose   YOU HAD AN ENDOSCOPIC PROCEDURE TODAY AT [facility]:   Refer to the procedure report that was given to you for any specific questions about what was found during the examination.  If the procedure report does not answer your questions, please call your gastroenterologist to clarify.  If you requested that your care partner not be given the details of your procedure findings, then the procedure report has been included in a sealed envelope for you to review at your convenience later.  YOU SHOULD EXPECT: Some feelings of bloating in the abdomen. Passage of more gas than usual.  Walking can help get rid of the air that was put into your GI tract during the procedure and reduce the bloating. If you had a lower endoscopy (such as a colonoscopy or flexible sigmoidoscopy) you may notice spotting of blood in your stool or on the toilet paper. If you underwent a bowel prep for your procedure, you may not have a normal bowel movement for a few days.  Please Note:  You might notice some irritation and congestion in your nose or some drainage.  This is from the oxygen used during your procedure.  There is no need for concern and it should clear up in a day or so.  SYMPTOMS TO REPORT IMMEDIATELY:   Following lower endoscopy (colonoscopy or flexible sigmoidoscopy):  Excessive amounts of blood in the stool  Significant tenderness or worsening of abdominal pains  Swelling of the abdomen that is new, acute  Fever of 100F or higher   For urgent or emergent issues, a gastroenterologist can be reached at any hour by calling [phone].   DIET:  We do recommend a small meal at first, but then you may proceed to your regular diet.  Drink plenty of fluids but you should avoid alcoholic beverages for 24 hours.  ACTIVITY:  You should plan to take it easy for the rest of today and you should NOT DRIVE  or use heavy machinery until tomorrow (because of the sedation medicines used during the test).    FOLLOW UP: Our staff will call the number listed on your records 48-72 hours following your procedure to check on you and address any questions or concerns that you may have regarding the information given to you following your procedure. If we do not reach you, we will leave a message.  We will attempt to reach you two times.  During this call, we will ask if you have developed any symptoms of COVID 19. If you develop any symptoms (ie: fever, flu-like symptoms, shortness of breath, cough etc.) before then, please call [phone].  If you test positive for Covid 19 in the 2 weeks post procedure, please call and report this information to [place].    If any biopsies were taken you will be contacted by phone or by letter within the next 1-3 weeks.  Please call us at [phone] if you have not heard about the biopsies in 3 weeks.    SIGNATURES/CONFIDENTIALITY: You and/or your care partner have signed paperwork which will be entered into your electronic medical record.  These signatures attest to the fact that that the information above on your After Visit Summary has been reviewed and is understood.  Full responsibility of the confidentiality of this discharge information lies with you and/or your care-partner.

## 2019-08-09 NOTE — Op Note (Signed)
[facility] Patient Name: Matthew Ochoa Procedure Date: 08/09/2019 8:13 AM MRN: [id number] Endoscopist: [place]. Loletha Carrow , MD Age: 73 Referring MD:  Date of Birth: 1945/11/04 Gender: Male Account #: [id number] Procedure:                Colonoscopy Indications:              Surveillance: Personal history of adenomatous                            polyps on last colonoscopy 5 years ago (TA and HP                            polyps 09/2013 - fair prep) Medicines:                Monitored Anesthesia Care Procedure:                Pre-Anesthesia Assessment:                           - Prior to the procedure, a History and Physical                            was performed, and patient medications and                            allergies were reviewed. The patient's tolerance of                            previous anesthesia was also reviewed. The risks                            and benefits of the procedure and the sedation                            options and risks were discussed with the patient.                            All questions were answered, and informed consent                            was obtained. Prior Anticoagulants: The patient has                            taken Xarelto (rivaroxaban), last dose was 2 days                            prior to procedure. ASA Grade Assessment: III - A                            patient with severe systemic disease. After                            reviewing the risks and benefits, the patient was  deemed in satisfactory condition to undergo the                            procedure.                           After obtaining informed consent, the colonoscope                            was passed under direct vision. Throughout the                            procedure, the patient's blood pressure, pulse, and                            oxygen saturations were monitored continuously. The        Colonoscope was introduced through the anus and                            advanced to the the cecum, identified by                            appendiceal orifice and ileocecal valve. The                            colonoscopy was performed without difficulty. The                            patient tolerated the procedure well. The quality                            of the bowel preparation was good. The ileocecal                            valve, appendiceal orifice, and rectum were                            photographed. The bowel preparation used was 2 day                            Suprep/Miralax (plus additional ducolax and mag                            citrate day prior). Scope In: 8:27:55 AM Scope Out: 8:47:10 AM Scope Withdrawal Time: 0 hours 16 minutes 37 seconds  Total Procedure Duration: 0 hours 19 minutes 15 seconds  Findings:                 The perianal and digital rectal examinations were                            normal.                           Multiple diverticula were found in the left colon  and right colon.                           Two sessile polyps were found in the mid transverse                            colon and distal transverse colon. The polyps were                            3 to 6 mm in size. These polyps were removed with a                            cold snare. Resection and retrieval were complete.                           A 4 mm polyp was found in the mid transverse colon.                            The polyp was pedunculated. The polyp was removed                            with a hot snare. Resection and retrieval were                            complete.                           The exam was otherwise without abnormality on                            direct and retroflexion views. Complications:            No immediate complications. Estimated Blood Loss:     Estimated blood loss was minimal. Impression:                - Diverticulosis in the left colon and in the right                            colon.                           - Two 3 to 6 mm polyps in the mid transverse colon                            and in the distal transverse colon, removed with a                            cold snare. Resected and retrieved.                           - One 4 mm polyp in the mid transverse colon,                            removed with a hot snare.  Resected and retrieved.                           - The examination was otherwise normal on direct                            and retroflexion views. Recommendation:           - Patient has a contact number available for                            emergencies. The signs and symptoms of potential                            delayed complications were discussed with the                            patient. Return to normal activities tomorrow.                            Written discharge instructions were provided to the                            patient.                           - Resume previous diet.                           - Continue present medications.                           - Await pathology results.                           - Resume Xarelto (rivaroxaban) at prior dose                            tomorrow.                           - No repeat colonoscopy. Trusten Hume L. Loletha Carrow, MD 08/09/2019 8:54:33 AM This report has been signed electronically.

## 2019-08-09 NOTE — Progress Notes (Signed)
Called to room to assist during endoscopic procedure.  Patient ID and intended procedure confirmed with present staff. Received instructions for my participation in the procedure from the performing physician.  

## 2019-08-13 ENCOUNTER — Telehealth: Payer: Self-pay

## 2019-08-13 NOTE — Telephone Encounter (Signed)
  Follow up Call-  Call back number 08/09/2019  Post procedure Call Back phone  # [phone]  Permission to leave phone message Yes  Some recent data might be hidden     Patient questions:  Do you have a fever, pain , or abdominal swelling? No. Pain Score  0 *  Have you tolerated food without any problems? Yes.    Have you been able to return to your normal activities? Yes.    Do you have any questions about your discharge instructions: Diet   No. Medications  No. Follow up visit  No.  Do you have questions or concerns about your Care? No.  Actions: * If pain score is 4 or above: No action needed, pain <4.  1. Have you developed a fever since your procedure? no  2.   Have you had an respiratory symptoms (SOB or cough) since your procedure? no  3.   Have you tested positive for COVID 19 since your procedure no  4.   Have you had any family members/close contacts diagnosed with the COVID 19 since your procedure?  no   If yes to any of these questions please route to Joylene John, RN and Alphonsa Gin, Therapist, sports.

## 2019-08-14 ENCOUNTER — Encounter: Payer: Self-pay | Admitting: Gastroenterology

## 2019-09-25 ENCOUNTER — Ambulatory Visit: Payer: Medicare Other | Attending: Internal Medicine

## 2019-09-25 DIAGNOSIS — Z23 Encounter for immunization: Secondary | ICD-10-CM | POA: Insufficient documentation

## 2019-09-25 NOTE — Progress Notes (Signed)
   [id number]  Name:  Matthew Ochoa.    MRN: [id number] DOB: [birth year]  09/25/2019  Matthew Ochoa was observed post Covid-19 immunization for 15 minutes without incidence. He was provided with Vaccine Information Sheet and instruction to access the V-Safe system.   Matthew Ochoa was instructed to call 911 with any severe reactions post vaccine: Marland Kitchen Difficulty breathing  . Swelling of your face and throat  . A fast heartbeat  . A bad rash all over your body  . Dizziness and weakness    Immunizations Administered    Name Date Dose VIS Date Route   Pfizer COVID-19 Vaccine 09/25/2019  9:30 AM 0.3 mL 08/17/2019 Intramuscular   Manufacturer: [place]   Lot: [id number]   [place]: [id number]

## 2019-10-09 IMAGING — DX CHEST - 2 VIEW
2 series · 2 of 2 positions shown · non-contrast
Comparison: 09/13/2018 and older exams.

CLINICAL DATA: COPD follow-up. History of asthma, hypertension and
diabetes.

EXAM:
CHEST - 2 VIEW

[chest pa]
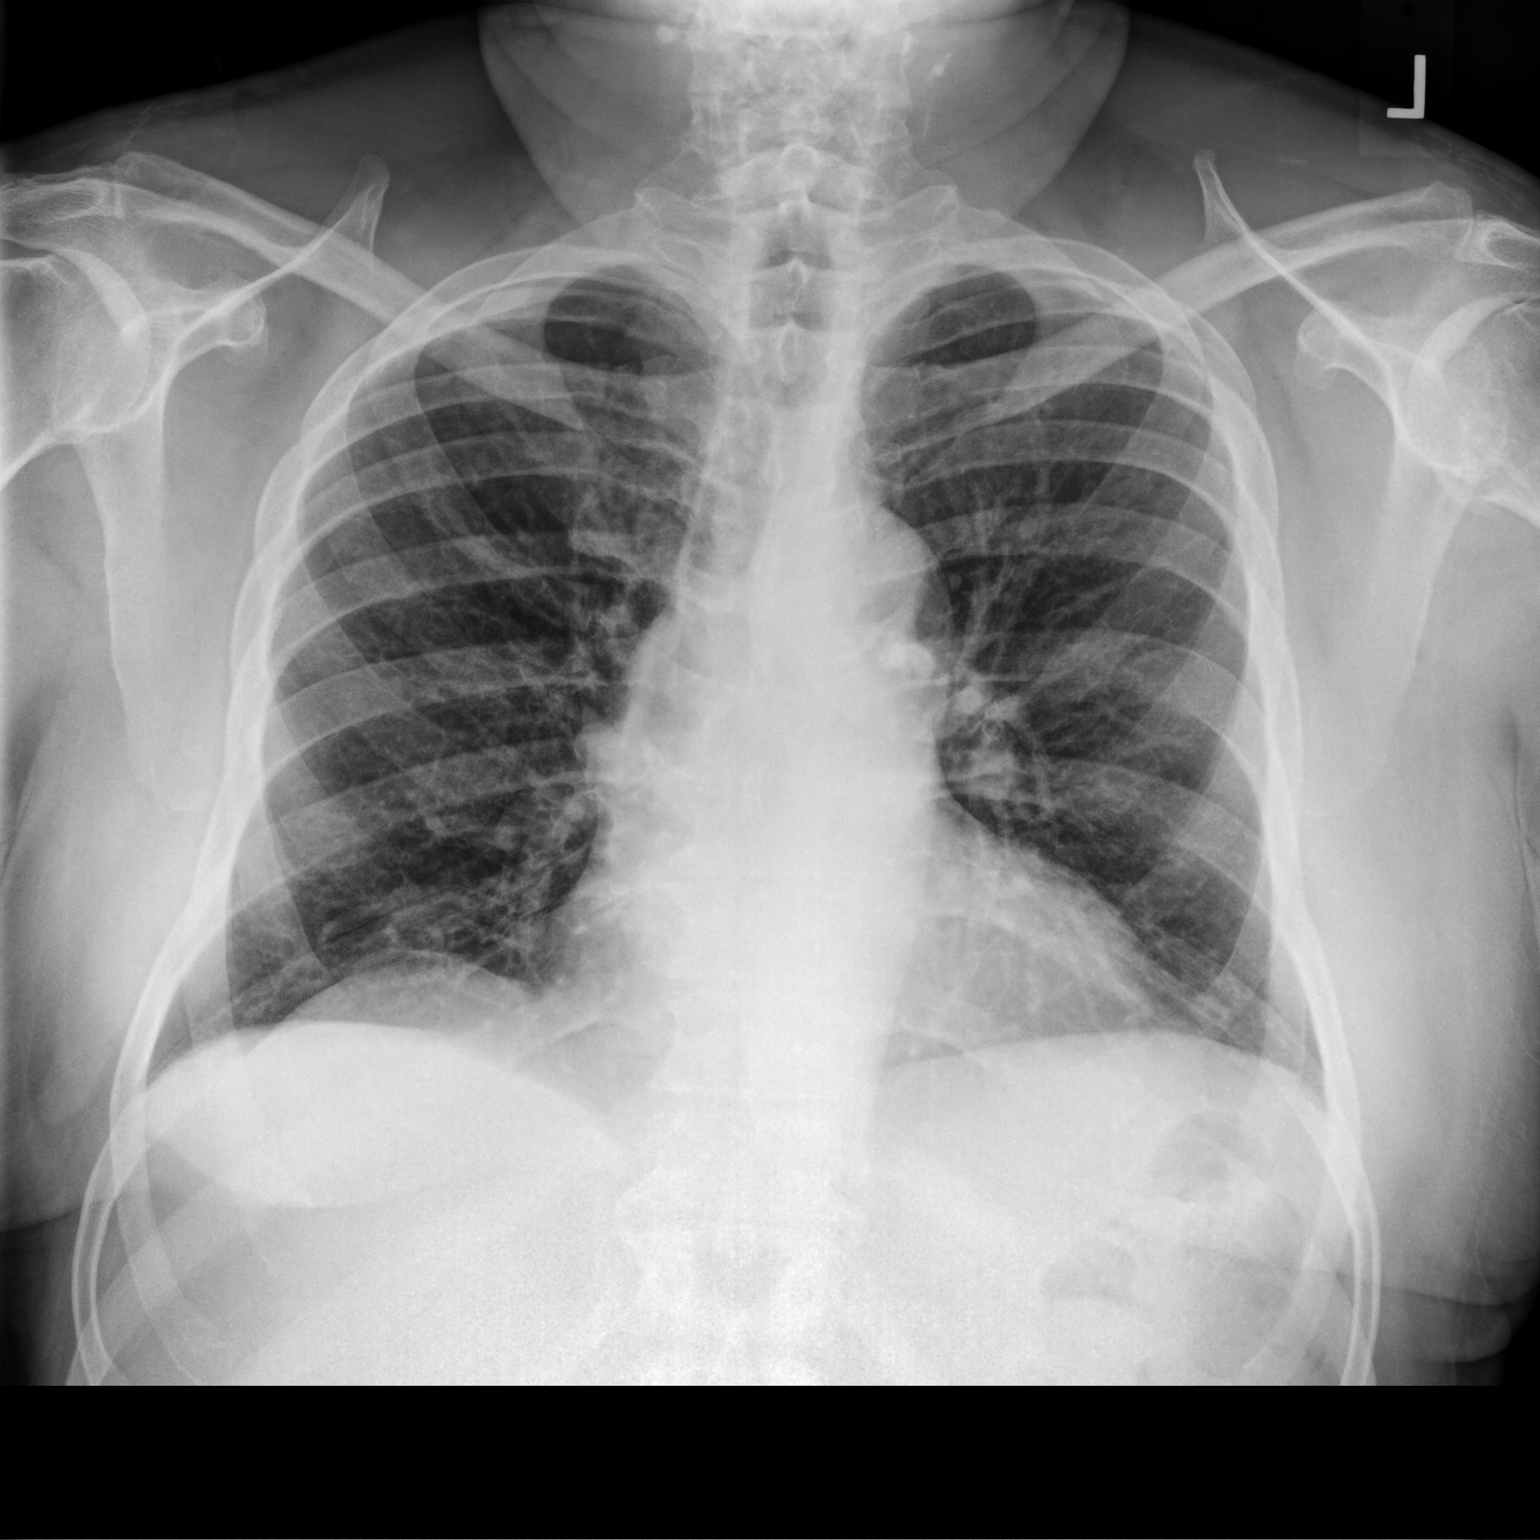

[chest lat]
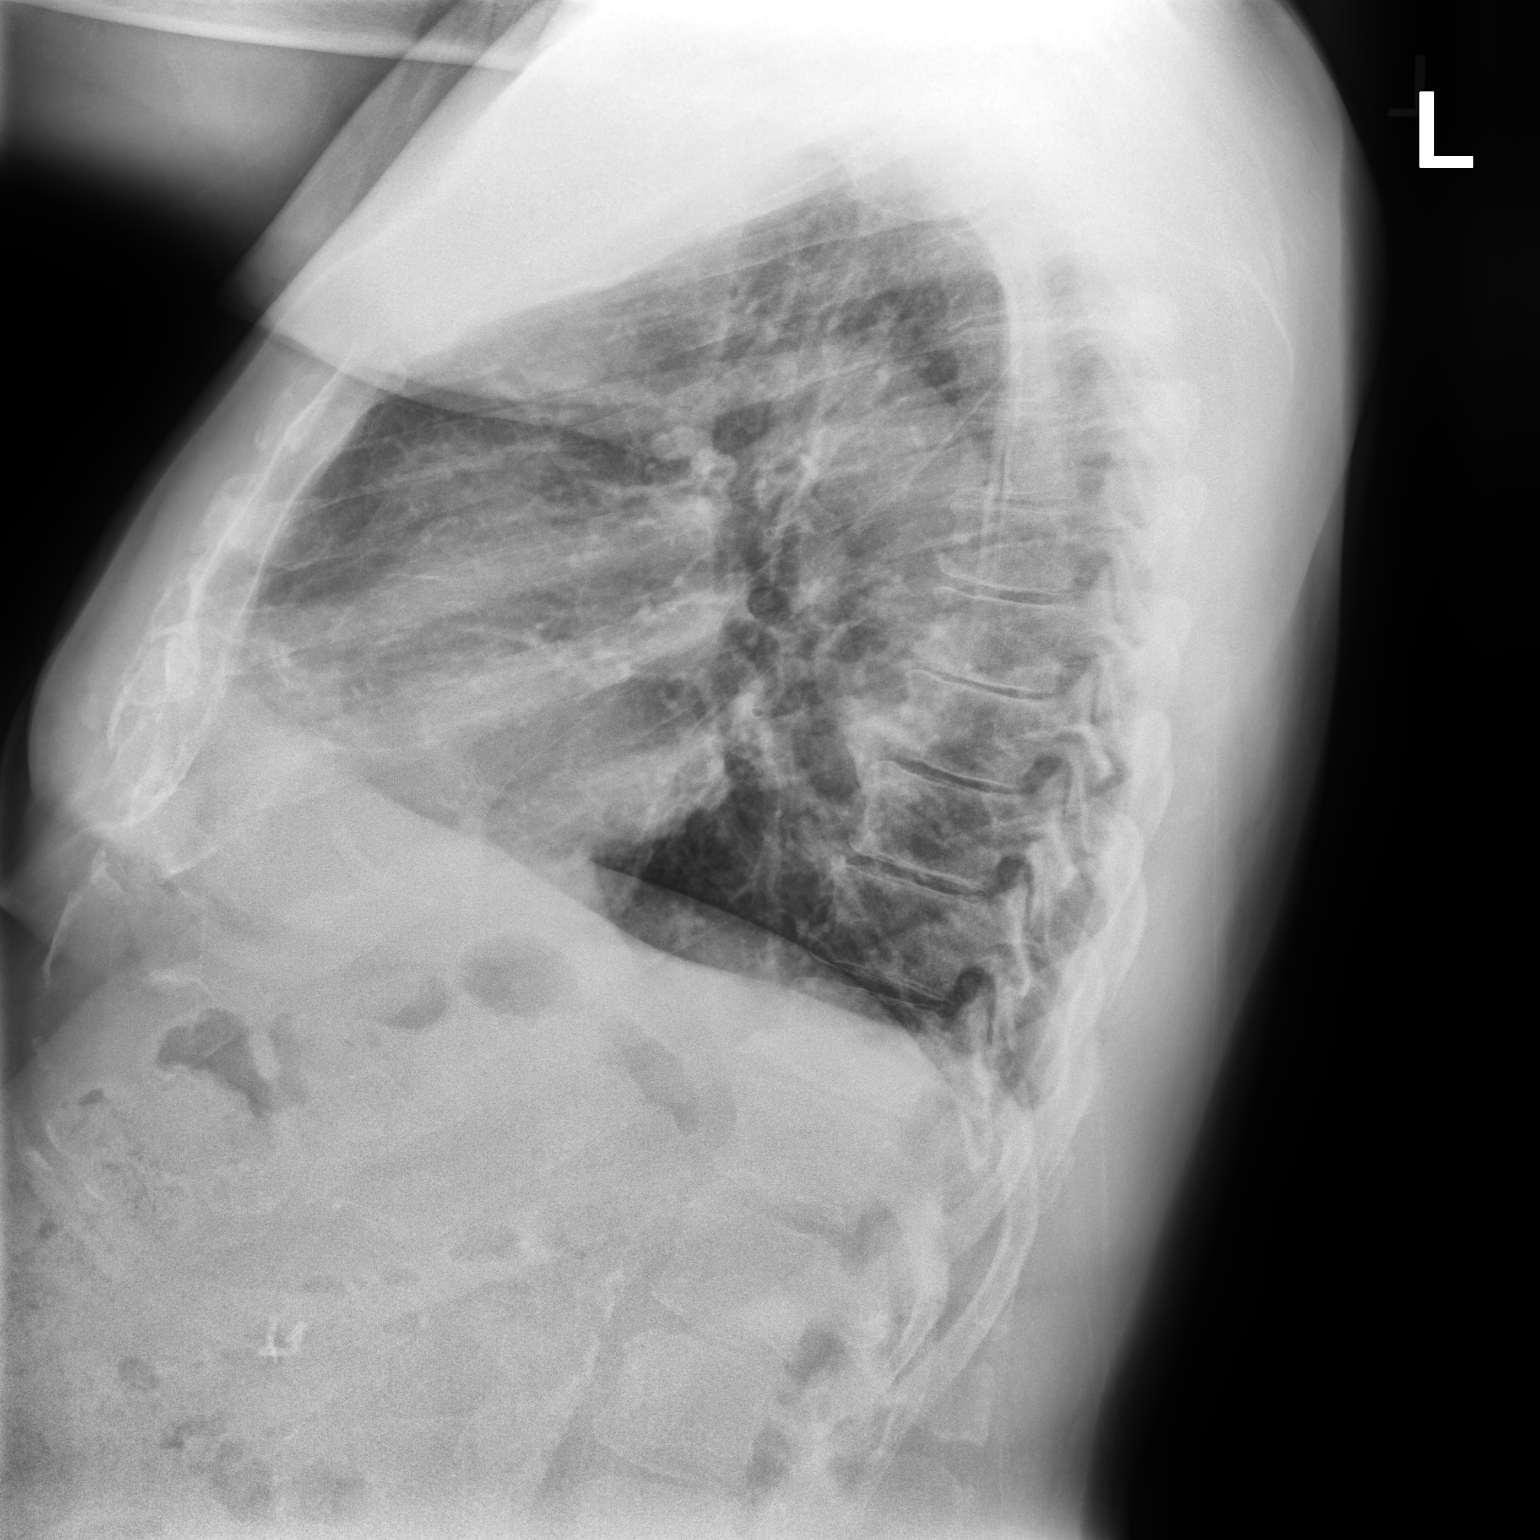

[2 of 2 positions shown; findings below may reference images not displayed]

FINDINGS: Cardiac silhouette is normal in size. No mediastinal or hilar
masses. No evidence of adenopathy.

Lungs are clear.  No pleural effusion or pneumothorax.

Skeletal structures are intact.
IMPRESSION: No active cardiopulmonary disease.

## 2019-10-16 ENCOUNTER — Ambulatory Visit: Payer: Medicare Other | Attending: Internal Medicine

## 2019-10-16 DIAGNOSIS — Z23 Encounter for immunization: Secondary | ICD-10-CM | POA: Insufficient documentation

## 2019-10-16 NOTE — Progress Notes (Signed)
   [id number]  Name:  Matthew Ochoa.    MRN: [id number] DOB: 12-Jun-1946  10/16/2019  Matthew Ochoa was observed post Covid-19 immunization for 15 minutes without incidence. He was provided with Vaccine Information Sheet and instruction to access the V-Safe system.   Matthew Ochoa was instructed to call 911 with any severe reactions post vaccine: Marland Kitchen Difficulty breathing  . Swelling of your face and throat  . A fast heartbeat  . A bad rash all over your body  . Dizziness and weakness    Immunizations Administered    Name Date Dose VIS Date Route   Pfizer COVID-19 Vaccine 10/16/2019  8:35 AM 0.3 mL 08/17/2019 Intramuscular   Manufacturer: [place]   Lot: [id number]   [place]: [id number]

## 2019-10-29 DIAGNOSIS — E119 Type 2 diabetes mellitus without complications: Secondary | ICD-10-CM | POA: Diagnosis not present

## 2019-10-29 DIAGNOSIS — H25013 Cortical age-related cataract, bilateral: Secondary | ICD-10-CM | POA: Diagnosis not present

## 2019-10-29 DIAGNOSIS — H40023 Open angle with borderline findings, high risk, bilateral: Secondary | ICD-10-CM | POA: Diagnosis not present

## 2019-10-29 DIAGNOSIS — H2513 Age-related nuclear cataract, bilateral: Secondary | ICD-10-CM | POA: Diagnosis not present

## 2019-11-01 DIAGNOSIS — N5201 Erectile dysfunction due to arterial insufficiency: Secondary | ICD-10-CM | POA: Diagnosis not present

## 2019-11-14 DIAGNOSIS — N5201 Erectile dysfunction due to arterial insufficiency: Secondary | ICD-10-CM | POA: Diagnosis not present

## 2019-12-05 DIAGNOSIS — E7800 Pure hypercholesterolemia, unspecified: Secondary | ICD-10-CM | POA: Diagnosis not present

## 2019-12-05 DIAGNOSIS — E119 Type 2 diabetes mellitus without complications: Secondary | ICD-10-CM | POA: Diagnosis not present

## 2019-12-05 DIAGNOSIS — F4321 Adjustment disorder with depressed mood: Secondary | ICD-10-CM | POA: Diagnosis not present

## 2019-12-05 DIAGNOSIS — E1169 Type 2 diabetes mellitus with other specified complication: Secondary | ICD-10-CM | POA: Diagnosis not present

## 2019-12-05 DIAGNOSIS — I480 Paroxysmal atrial fibrillation: Secondary | ICD-10-CM | POA: Diagnosis not present

## 2019-12-05 DIAGNOSIS — I10 Essential (primary) hypertension: Secondary | ICD-10-CM | POA: Diagnosis not present

## 2019-12-05 DIAGNOSIS — I4891 Unspecified atrial fibrillation: Secondary | ICD-10-CM | POA: Diagnosis not present

## 2019-12-05 DIAGNOSIS — I509 Heart failure, unspecified: Secondary | ICD-10-CM | POA: Diagnosis not present

## 2019-12-05 DIAGNOSIS — N400 Enlarged prostate without lower urinary tract symptoms: Secondary | ICD-10-CM | POA: Diagnosis not present

## 2019-12-24 DIAGNOSIS — N5201 Erectile dysfunction due to arterial insufficiency: Secondary | ICD-10-CM | POA: Diagnosis not present

## 2019-12-24 DIAGNOSIS — R310 Gross hematuria: Secondary | ICD-10-CM | POA: Diagnosis not present

## 2019-12-26 DIAGNOSIS — I2699 Other pulmonary embolism without acute cor pulmonale: Secondary | ICD-10-CM | POA: Diagnosis not present

## 2019-12-26 DIAGNOSIS — I10 Essential (primary) hypertension: Secondary | ICD-10-CM | POA: Diagnosis not present

## 2019-12-26 DIAGNOSIS — E785 Hyperlipidemia, unspecified: Secondary | ICD-10-CM | POA: Diagnosis not present

## 2019-12-26 DIAGNOSIS — E119 Type 2 diabetes mellitus without complications: Secondary | ICD-10-CM | POA: Diagnosis not present

## 2019-12-26 DIAGNOSIS — M1990 Unspecified osteoarthritis, unspecified site: Secondary | ICD-10-CM | POA: Diagnosis not present

## 2019-12-26 DIAGNOSIS — I2510 Atherosclerotic heart disease of native coronary artery without angina pectoris: Secondary | ICD-10-CM | POA: Diagnosis not present

## 2019-12-26 DIAGNOSIS — I471 Supraventricular tachycardia: Secondary | ICD-10-CM | POA: Diagnosis not present

## 2019-12-26 DIAGNOSIS — G4733 Obstructive sleep apnea (adult) (pediatric): Secondary | ICD-10-CM | POA: Diagnosis not present

## 2020-01-28 ENCOUNTER — Ambulatory Visit: Payer: Medicare Other | Admitting: Internal Medicine

## 2020-01-30 ENCOUNTER — Encounter: Payer: Self-pay | Admitting: Internal Medicine

## 2020-03-03 ENCOUNTER — Encounter: Payer: Self-pay | Admitting: Internal Medicine

## 2020-03-03 ENCOUNTER — Other Ambulatory Visit: Payer: Self-pay

## 2020-03-03 ENCOUNTER — Ambulatory Visit (INDEPENDENT_AMBULATORY_CARE_PROVIDER_SITE_OTHER): Payer: Medicare Other | Admitting: Internal Medicine

## 2020-03-03 DIAGNOSIS — G4733 Obstructive sleep apnea (adult) (pediatric): Secondary | ICD-10-CM

## 2020-03-03 DIAGNOSIS — J449 Chronic obstructive pulmonary disease, unspecified: Secondary | ICD-10-CM | POA: Diagnosis not present

## 2020-03-03 NOTE — Patient Instructions (Signed)
We can continue CPAP auto 5-20, mask of choice, humidifier, supplies, AirView/ card  Please call if we can help 

## 2020-03-03 NOTE — Progress Notes (Signed)
HPI male former smoker followed for OSA, history PE/ Xarelto, COPD, complicated by allergic rhinitis, DM 2, A. Fib/amiodarone, glaucoma He had smoked one pack per day for 30 years, quitting in June of 2012. Office PFT 11/05/2002 had recorded FVC 3500 (74%) FEV1 2800 (75%) FEV1/FVC 0.80 with no response to bronchodilator. OSA- NPSG 03/17/98- AHI 32/hr; weight was 270 lbs. he has worn CPAP with variable long-term compliance and control. He says sometimes the mask is "bothersome" because of postnasal drainage and he is evasive about whether he has been using it recently. Pressure had been set at 14. PFT-08/09/2011-mild obstructive airways disease with insignificant response to bronchodilator, mild restriction, mild reduction of DLCO FEV1/FVC 0.65, DLCO 69%. TLC 71%. 6 minute walk test 08/09/2011-96% on room air to start, falling to 88% on room air by the end of his walk. Blood pressure rose to 170/110, pulse 112. After 2 minutes recovery oxygen saturation on room air at rest was 94%. He walked 46 m CT chest 07/01/12-Positive study for bilateral central and segmental pulmonary  emboli.  PFT 07/22/17-moderate obstructive airways disease, insignificant response -----------------------------------------------------------------------------------------------------   01/26/2019- 74 year old male former smoker followed for COPD, history PE/ Xarelto, OSA, complicated by allergic rhinitis, DM 2, A. Fib/amiodarone, HBP, glaucoma CPAP auto 5-20/ APS-[facility] Download 53%, AHI 0.4/ hr -----OSA on CPAP; DME: [facility], pt states CPAP is going well for him, using it every night; no issues w/ pressure settings or mask; DL attached Xarelto, Amiodarone,  I reviewed download with him, documenting only 53% compliance with goals, but good control. He is getting an Clinical research associate. Feels well now, with no acute illness during the winter. Denies wheeze, cough, dyspnea..  03/03/20- 74 year old male former smoker followed for  COPD, history PE/ Xarelto, OSA, complicated by allergic rhinitis, DM 2, A. Fib/amiodarone, HBP, glaucoma CPAP auto 5-20/ [facility]PS-[facility] Download compliance 77%, AHI 0.5/ hr Body weight today - 280 lbs Says sometimes he sleeps poorly. Sometimes mask bothers. Discussed comfort goals and sleep habits.  samples had little effect. Denies cough, wheeze.  CXR 01/26/2019- No active cardiopulmonary disease.  ROS-see HPI   + = positive Constitutional:   No-   weight loss, night sweats, fevers, chills, fatigue, lassitude. HEENT:   No-  headaches, difficulty swallowing, tooth/dental problems, sore throat,       No-  sneezing, itching, ear ache, +nasal congestion, post nasal drip,  CV:  No-   chest pain, orthopnea, PND, swelling in lower extremities, anasarca, dizziness, palpitations Resp:  shortness of breath with exertion or at rest.              No-  productive cough,   non-productive cough,  No- coughing up of blood.              No-   change in color of mucus.  No- wheezing.   Skin: No-   rash or lesions. GI:  No-   heartburn, indigestion, abdominal pain, nausea, vomiting,  GU: . MS:  No-   joint pain or swelling.  . Neuro-     nothing unusual Psych:  No- change in mood or affect. No depression or anxiety.  No memory loss.  OBJ- Physical Exam General- Alert, Oriented, Affect-appropriate, Distress- none acute, overweight,  + obese Skin- rash-none, lesions- none, excoriation- none Lymphadenopathy- none Head- atraumatic            Eyes- Gross vision intact, PERRLA, conjunctivae and secretions clear            Ears- Hearing, canals-normal  Nose- clear, no-Septal dev, mucus, polyps, erosion, perforation             Throat- Mallampati III , mucosa clear , drainage- none, tonsils- atrophic,  + own teeth Neck- flexible , trachea midline, no stridor , thyroid nl, carotid no bruit Chest - symmetrical excursion , unlabored           Heart/CV- RR at this visit , no murmur , no gallop  , no rub,  nl s1 s2                           - JVD- none , edema- none, stasis changes- none, varices- none           Lung- wheeze-none, cough- none , dullness-none, rub- none           Chest wall-  Abd-  Br/ Gen/ Rectal- Not done, not indicated Extrem- cyanosis- none, clubbing, none, atrophy- none, strength- nl Neuro- grossly intact to observation

## 2020-03-08 NOTE — Assessment & Plan Note (Signed)
No active bronchitis symptoms. Moderate persistent, uncomplicated Plan- watch for cardiac/ pulmonary symptom overlap. Watch off maintenance inhalers since he denies any effec.

## 2020-03-08 NOTE — Assessment & Plan Note (Signed)
Benefits from CPAP with adequate compliance and conytrol. Discused goal, comfort and sleep habits. Plan- continue auto 5-20

## 2020-05-07 DIAGNOSIS — E119 Type 2 diabetes mellitus without complications: Secondary | ICD-10-CM | POA: Diagnosis not present

## 2020-05-07 DIAGNOSIS — I2510 Atherosclerotic heart disease of native coronary artery without angina pectoris: Secondary | ICD-10-CM | POA: Diagnosis not present

## 2020-05-07 DIAGNOSIS — I471 Supraventricular tachycardia: Secondary | ICD-10-CM | POA: Diagnosis not present

## 2020-05-07 DIAGNOSIS — I2699 Other pulmonary embolism without acute cor pulmonale: Secondary | ICD-10-CM | POA: Diagnosis not present

## 2020-05-07 DIAGNOSIS — I10 Essential (primary) hypertension: Secondary | ICD-10-CM | POA: Diagnosis not present

## 2020-05-07 DIAGNOSIS — E785 Hyperlipidemia, unspecified: Secondary | ICD-10-CM | POA: Diagnosis not present

## 2020-05-08 DIAGNOSIS — Z125 Encounter for screening for malignant neoplasm of prostate: Secondary | ICD-10-CM | POA: Diagnosis not present

## 2020-05-16 DIAGNOSIS — H40023 Open angle with borderline findings, high risk, bilateral: Secondary | ICD-10-CM | POA: Diagnosis not present

## 2020-05-29 DIAGNOSIS — Z23 Encounter for immunization: Secondary | ICD-10-CM | POA: Diagnosis not present

## 2020-06-24 DIAGNOSIS — N5201 Erectile dysfunction due to arterial insufficiency: Secondary | ICD-10-CM | POA: Diagnosis not present

## 2020-10-01 ENCOUNTER — Other Ambulatory Visit: Payer: Self-pay | Admitting: Internal Medicine

## 2020-10-01 DIAGNOSIS — I739 Peripheral vascular disease, unspecified: Secondary | ICD-10-CM

## 2020-10-01 DIAGNOSIS — I700 Atherosclerosis of aorta: Secondary | ICD-10-CM | POA: Diagnosis not present

## 2020-10-01 DIAGNOSIS — I10 Essential (primary) hypertension: Secondary | ICD-10-CM | POA: Diagnosis not present

## 2020-10-01 DIAGNOSIS — E7800 Pure hypercholesterolemia, unspecified: Secondary | ICD-10-CM | POA: Diagnosis not present

## 2020-10-01 DIAGNOSIS — Z7984 Long term (current) use of oral hypoglycemic drugs: Secondary | ICD-10-CM | POA: Diagnosis not present

## 2020-10-01 DIAGNOSIS — G4730 Sleep apnea, unspecified: Secondary | ICD-10-CM | POA: Diagnosis not present

## 2020-10-01 DIAGNOSIS — E1169 Type 2 diabetes mellitus with other specified complication: Secondary | ICD-10-CM | POA: Diagnosis not present

## 2020-10-03 ENCOUNTER — Ambulatory Visit
Admission: RE | Admit: 2020-10-03 | Discharge: 2020-10-03 | Disposition: A | Discharge status: Home / Self Care | Payer: Medicare Other | Source: Ambulatory Visit | Attending: Internal Medicine | Admitting: Internal Medicine

## 2020-10-03 DIAGNOSIS — I739 Peripheral vascular disease, unspecified: Secondary | ICD-10-CM | POA: Diagnosis not present

## 2020-12-01 DIAGNOSIS — E119 Type 2 diabetes mellitus without complications: Secondary | ICD-10-CM | POA: Diagnosis not present

## 2020-12-01 DIAGNOSIS — H2513 Age-related nuclear cataract, bilateral: Secondary | ICD-10-CM | POA: Diagnosis not present

## 2020-12-01 DIAGNOSIS — H40023 Open angle with borderline findings, high risk, bilateral: Secondary | ICD-10-CM | POA: Diagnosis not present

## 2020-12-01 DIAGNOSIS — H25013 Cortical age-related cataract, bilateral: Secondary | ICD-10-CM | POA: Diagnosis not present

## 2020-12-23 ENCOUNTER — Other Ambulatory Visit: Payer: Self-pay

## 2020-12-23 ENCOUNTER — Ambulatory Visit: Payer: Medicare Other | Attending: Internal Medicine

## 2020-12-23 DIAGNOSIS — Z23 Encounter for immunization: Secondary | ICD-10-CM

## 2020-12-23 NOTE — Progress Notes (Signed)
   [id number]  Name:  Matthew Ochoa.    MRN: [id number] DOB: May 12, 1946  12/23/2020  [name] was observed post Covid-19 immunization for 15 minutes without incident. He was provided with Vaccine Information Sheet and instruction to access the V-Safe system.   [name] was instructed to call 911 with any severe reactions post vaccine: Marland Kitchen Difficulty breathing  . Swelling of face and throat  . A fast heartbeat  . A bad rash all over body  . Dizziness and weakness   Immunizations Administered    Name Date Dose VIS Date Route   PFIZER Comrnaty(Gray TOP) Covid-19 Vaccine 12/23/2020  3:45 PM 0.3 mL 08/14/2020 Intramuscular   Manufacturer: [employer], [employer]   Lot: [id number]   NDC: [phone]

## 2021-01-28 DIAGNOSIS — R040 Epistaxis: Secondary | ICD-10-CM | POA: Diagnosis not present

## 2021-01-29 DIAGNOSIS — I471 Supraventricular tachycardia: Secondary | ICD-10-CM | POA: Diagnosis not present

## 2021-01-29 DIAGNOSIS — I10 Essential (primary) hypertension: Secondary | ICD-10-CM | POA: Diagnosis not present

## 2021-01-29 DIAGNOSIS — E119 Type 2 diabetes mellitus without complications: Secondary | ICD-10-CM | POA: Diagnosis not present

## 2021-01-29 DIAGNOSIS — E785 Hyperlipidemia, unspecified: Secondary | ICD-10-CM | POA: Diagnosis not present

## 2021-01-29 DIAGNOSIS — I2699 Other pulmonary embolism without acute cor pulmonale: Secondary | ICD-10-CM | POA: Diagnosis not present

## 2021-01-29 DIAGNOSIS — I2510 Atherosclerotic heart disease of native coronary artery without angina pectoris: Secondary | ICD-10-CM | POA: Diagnosis not present

## 2021-02-04 DIAGNOSIS — E119 Type 2 diabetes mellitus without complications: Secondary | ICD-10-CM | POA: Diagnosis not present

## 2021-02-04 DIAGNOSIS — I10 Essential (primary) hypertension: Secondary | ICD-10-CM | POA: Diagnosis not present

## 2021-02-04 DIAGNOSIS — E785 Hyperlipidemia, unspecified: Secondary | ICD-10-CM | POA: Diagnosis not present

## 2021-03-03 ENCOUNTER — Ambulatory Visit: Payer: Medicare Other | Admitting: Internal Medicine

## 2021-03-24 ENCOUNTER — Ambulatory Visit: Payer: Medicare Other | Admitting: Internal Medicine

## 2021-03-25 DIAGNOSIS — Z7984 Long term (current) use of oral hypoglycemic drugs: Secondary | ICD-10-CM | POA: Diagnosis not present

## 2021-03-25 DIAGNOSIS — E1165 Type 2 diabetes mellitus with hyperglycemia: Secondary | ICD-10-CM | POA: Diagnosis not present

## 2021-03-25 DIAGNOSIS — Z1389 Encounter for screening for other disorder: Secondary | ICD-10-CM | POA: Diagnosis not present

## 2021-03-25 DIAGNOSIS — Z0000 Encounter for general adult medical examination without abnormal findings: Secondary | ICD-10-CM | POA: Diagnosis not present

## 2021-03-25 DIAGNOSIS — I10 Essential (primary) hypertension: Secondary | ICD-10-CM | POA: Diagnosis not present

## 2021-03-25 DIAGNOSIS — E1169 Type 2 diabetes mellitus with other specified complication: Secondary | ICD-10-CM | POA: Diagnosis not present

## 2021-03-25 DIAGNOSIS — I2699 Other pulmonary embolism without acute cor pulmonale: Secondary | ICD-10-CM | POA: Diagnosis not present

## 2021-03-25 DIAGNOSIS — I700 Atherosclerosis of aorta: Secondary | ICD-10-CM | POA: Diagnosis not present

## 2021-03-25 DIAGNOSIS — E7800 Pure hypercholesterolemia, unspecified: Secondary | ICD-10-CM | POA: Diagnosis not present

## 2021-03-25 DIAGNOSIS — G4730 Sleep apnea, unspecified: Secondary | ICD-10-CM | POA: Diagnosis not present

## 2021-03-25 DIAGNOSIS — R0989 Other specified symptoms and signs involving the circulatory and respiratory systems: Secondary | ICD-10-CM | POA: Diagnosis not present

## 2021-04-02 DIAGNOSIS — I4891 Unspecified atrial fibrillation: Secondary | ICD-10-CM | POA: Diagnosis not present

## 2021-04-02 DIAGNOSIS — I480 Paroxysmal atrial fibrillation: Secondary | ICD-10-CM | POA: Diagnosis not present

## 2021-04-02 DIAGNOSIS — E7800 Pure hypercholesterolemia, unspecified: Secondary | ICD-10-CM | POA: Diagnosis not present

## 2021-04-02 DIAGNOSIS — E119 Type 2 diabetes mellitus without complications: Secondary | ICD-10-CM | POA: Diagnosis not present

## 2021-04-02 DIAGNOSIS — F4321 Adjustment disorder with depressed mood: Secondary | ICD-10-CM | POA: Diagnosis not present

## 2021-04-02 DIAGNOSIS — N400 Enlarged prostate without lower urinary tract symptoms: Secondary | ICD-10-CM | POA: Diagnosis not present

## 2021-04-02 DIAGNOSIS — E1169 Type 2 diabetes mellitus with other specified complication: Secondary | ICD-10-CM | POA: Diagnosis not present

## 2021-04-02 DIAGNOSIS — I10 Essential (primary) hypertension: Secondary | ICD-10-CM | POA: Diagnosis not present

## 2021-04-02 DIAGNOSIS — I509 Heart failure, unspecified: Secondary | ICD-10-CM | POA: Diagnosis not present

## 2021-04-07 ENCOUNTER — Ambulatory Visit: Payer: Medicare Other | Admitting: Internal Medicine

## 2021-05-04 ENCOUNTER — Encounter: Payer: Self-pay | Admitting: Internal Medicine

## 2021-05-05 ENCOUNTER — Ambulatory Visit: Payer: Medicare Other | Admitting: Internal Medicine

## 2021-05-05 NOTE — Progress Notes (Signed)
HPI male former smoker followed for OSA, history PE/ Xarelto, COPD, complicated by allergic rhinitis, DM 2, A. Fib/amiodarone, glaucoma He had smoked one pack per day for 30 years, quitting in June of 2012. Office PFT 11/05/2002 had recorded FVC 3500 (74%) FEV1 2800 (75%) FEV1/FVC 0.80 with no response to bronchodilator. OSA- NPSG 03/17/98- AHI 32/hr; weight was 270 lbs. he has worn CPAP with variable long-term compliance and control. He says sometimes the mask is "bothersome" because of postnasal drainage and he is evasive about whether he has been using it recently. Pressure had been set at 14. PFT-08/09/2011-mild obstructive airways disease with insignificant response to bronchodilator, mild restriction, mild reduction of DLCO FEV1/FVC 0.65, DLCO 69%. TLC 71%. 6 minute walk test 08/09/2011-96% on room air to start, falling to 88% on room air by the end of his walk. Blood pressure rose to 170/110, pulse 112. After 2 minutes recovery oxygen saturation on room air at rest was 94%. He walked 71 m CT chest 07/01/12-Positive study for bilateral central and segmental pulmonary  emboli.  PFT 07/22/17-moderate obstructive airways disease, insignificant response -----------------------------------------------------------------------------------------------------   03/03/20- 75 year old male former smoker followed for COPD, history PE/ Xarelto, OSA, complicated by allergic rhinitis, DM 2, A. Fib/amiodarone, HBP, glaucoma CPAP auto 5-20/ [facility] Download compliance 77%, AHI 0.5/ hr Body weight today - 280 lbs Says sometimes he sleeps poorly. Sometimes mask bothers. Discussed comfort goals and sleep habits.  samples had little effect. Denies cough, wheeze.  CXR 01/26/2019- No active cardiopulmonary disease.  05/06/21- 75 year old male former smoker(60 pk yrs) followed for COPD, history PE/ Xarelto, OSA, complicated by allergic rhinitis, DM 2, ASCVD, A. Fib/ amiodarone, HBP, Glaucoma, -No inhalers CPAP  auto 5-20/ [facility] Download-compliance 83%, AHI 0.9/ hr Body weight today-274 lbs Covid vax-3 [employer] Reviewed download. CPAP is comfortable  but machine is old.  Not needing his inhaler- admits minimal occ dry cough w/o wheeze. No acute events.  ROS-see HPI   + = positive Constitutional:   No-   weight loss, night sweats, fevers, chills, fatigue, lassitude. HEENT:   No-  headaches, difficulty swallowing, tooth/dental problems, sore throat,       No-  sneezing, itching, ear ache, +nasal congestion, post nasal drip,  CV:  No-   chest pain, orthopnea, PND, swelling in lower extremities, anasarca, dizziness, palpitations Resp:  shortness of breath with exertion or at rest.              No-  productive cough,   +non-productive cough,  No- coughing up of blood.              No-   change in color of mucus.  No- wheezing.   Skin: No-   rash or lesions. GI:  No-   heartburn, indigestion, abdominal pain, nausea, vomiting,  GU: . MS:  No-   joint pain or swelling.  . Neuro-     nothing unusual Psych:  No- change in mood or affect. No depression or anxiety.  No memory loss.  OBJ- Physical Exam General- Alert, Oriented, Affect-appropriate, Distress- none acute, overweight,  + obese Skin- rash-none, lesions- none, excoriation- none Lymphadenopathy- none Head- atraumatic            Eyes- Gross vision intact, PERRLA, conjunctivae and secretions clear            Ears- Hearing, canals-normal            Nose- clear, no-Septal dev, mucus, polyps, erosion, perforation  Throat- Mallampati III , mucosa clear , drainage- none, tonsils- atrophic,  + own teeth Neck- flexible , trachea midline, no stridor , thyroid nl, carotid no bruit Chest - symmetrical excursion , unlabored           Heart/CV- RR at this visit , no murmur , no gallop  , no rub, nl s1 s2                           - JVD- none , edema- none, stasis changes- none, varices- none           Lung- wheeze-none, cough- none ,  dullness-none, rub- none           Chest wall-  Abd-  Br/ Gen/ Rectal- Not done, not indicated Extrem- cyanosis- none, clubbing, none, atrophy- none, strength- nl Neuro- grossly intact to observation

## 2021-05-06 ENCOUNTER — Other Ambulatory Visit: Payer: Self-pay

## 2021-05-06 ENCOUNTER — Ambulatory Visit (INDEPENDENT_AMBULATORY_CARE_PROVIDER_SITE_OTHER): Payer: Medicare Other | Admitting: Internal Medicine

## 2021-05-06 ENCOUNTER — Ambulatory Visit (INDEPENDENT_AMBULATORY_CARE_PROVIDER_SITE_OTHER): Payer: Medicare Other

## 2021-05-06 ENCOUNTER — Encounter: Payer: Self-pay | Admitting: Internal Medicine

## 2021-05-06 VITALS — BP 136/64 | HR 58 | Temp 98.1°F | Ht 71.0 in | Wt 274.0 lb

## 2021-05-06 DIAGNOSIS — G4733 Obstructive sleep apnea (adult) (pediatric): Secondary | ICD-10-CM | POA: Diagnosis not present

## 2021-05-06 DIAGNOSIS — Z87891 Personal history of nicotine dependence: Secondary | ICD-10-CM

## 2021-05-06 DIAGNOSIS — J449 Chronic obstructive pulmonary disease, unspecified: Secondary | ICD-10-CM

## 2021-05-06 DIAGNOSIS — J9811 Atelectasis: Secondary | ICD-10-CM | POA: Diagnosis not present

## 2021-05-06 NOTE — Patient Instructions (Addendum)
Order- DME [facility] please replace old CPAP machine,  auto 5-20, mask of choice, humidifier, supplies, please install AirView/ card  Order- CXR  dx former smoker  Please call if we can help

## 2021-05-12 ENCOUNTER — Encounter: Payer: Self-pay | Admitting: *Deleted

## 2021-05-24 ENCOUNTER — Encounter: Payer: Self-pay | Admitting: Internal Medicine

## 2021-05-24 NOTE — Assessment & Plan Note (Signed)
Benefits with good compliance and control Plan- replace old CPAP machine

## 2021-05-24 NOTE — Assessment & Plan Note (Signed)
Minimizes symptoms, denies acute episodes. Plan- update CXR

## 2021-06-05 ENCOUNTER — Other Ambulatory Visit (HOSPITAL_BASED_OUTPATIENT_CLINIC_OR_DEPARTMENT_OTHER): Payer: Self-pay

## 2021-06-05 ENCOUNTER — Ambulatory Visit: Payer: Medicare Other | Attending: Internal Medicine

## 2021-06-05 DIAGNOSIS — E119 Type 2 diabetes mellitus without complications: Secondary | ICD-10-CM | POA: Diagnosis not present

## 2021-06-05 DIAGNOSIS — H40023 Open angle with borderline findings, high risk, bilateral: Secondary | ICD-10-CM | POA: Diagnosis not present

## 2021-06-05 DIAGNOSIS — H2513 Age-related nuclear cataract, bilateral: Secondary | ICD-10-CM | POA: Diagnosis not present

## 2021-06-05 DIAGNOSIS — H25013 Cortical age-related cataract, bilateral: Secondary | ICD-10-CM | POA: Diagnosis not present

## 2021-06-05 DIAGNOSIS — Z23 Encounter for immunization: Secondary | ICD-10-CM

## 2021-06-05 MED ORDER — PFIZER COVID-19 VAC BIVALENT 30 MCG/0.3ML IM SUSP
INTRAMUSCULAR | 0 refills | Status: DC
  Filled 2021-06-05: qty 0.3, 1d supply, fill #0

## 2021-06-05 NOTE — Progress Notes (Signed)
   [id number]  Name:  Dorn Hartshorne.    MRN: [id number] DOB: 02-Dec-1945  06/05/2021  Mr. Stclair was observed post Covid-19 immunization for 15 minutes without incident. He was provided with Vaccine Information Sheet and instruction to access the V-Safe system.   Mr. Fantroy was instructed to call 911 with any severe reactions post vaccine: Difficulty breathing  Swelling of face and throat  A fast heartbeat  A bad rash all over body  Dizziness and weakness

## 2021-06-24 ENCOUNTER — Telehealth: Payer: Self-pay | Admitting: Internal Medicine

## 2021-06-24 NOTE — Telephone Encounter (Signed)
Called and spoke with pt and he is aware that he will have to wait to get a new machine until November 2023.  Pt is aware and nothing further is needed.

## 2021-07-05 DIAGNOSIS — Z23 Encounter for immunization: Secondary | ICD-10-CM | POA: Diagnosis not present

## 2021-07-24 DIAGNOSIS — I2510 Atherosclerotic heart disease of native coronary artery without angina pectoris: Secondary | ICD-10-CM | POA: Diagnosis not present

## 2021-07-24 DIAGNOSIS — I471 Supraventricular tachycardia: Secondary | ICD-10-CM | POA: Diagnosis not present

## 2021-07-24 DIAGNOSIS — E119 Type 2 diabetes mellitus without complications: Secondary | ICD-10-CM | POA: Diagnosis not present

## 2021-07-24 DIAGNOSIS — I10 Essential (primary) hypertension: Secondary | ICD-10-CM | POA: Diagnosis not present

## 2021-07-24 DIAGNOSIS — I2699 Other pulmonary embolism without acute cor pulmonale: Secondary | ICD-10-CM | POA: Diagnosis not present

## 2021-07-24 DIAGNOSIS — E785 Hyperlipidemia, unspecified: Secondary | ICD-10-CM | POA: Diagnosis not present

## 2021-09-23 DIAGNOSIS — E7800 Pure hypercholesterolemia, unspecified: Secondary | ICD-10-CM | POA: Diagnosis not present

## 2021-09-23 DIAGNOSIS — I480 Paroxysmal atrial fibrillation: Secondary | ICD-10-CM | POA: Diagnosis not present

## 2021-09-23 DIAGNOSIS — I2699 Other pulmonary embolism without acute cor pulmonale: Secondary | ICD-10-CM | POA: Diagnosis not present

## 2021-09-23 DIAGNOSIS — I10 Essential (primary) hypertension: Secondary | ICD-10-CM | POA: Diagnosis not present

## 2021-09-23 DIAGNOSIS — Z7984 Long term (current) use of oral hypoglycemic drugs: Secondary | ICD-10-CM | POA: Diagnosis not present

## 2021-09-23 DIAGNOSIS — E1169 Type 2 diabetes mellitus with other specified complication: Secondary | ICD-10-CM | POA: Diagnosis not present

## 2021-09-23 DIAGNOSIS — G4730 Sleep apnea, unspecified: Secondary | ICD-10-CM | POA: Diagnosis not present

## 2021-09-23 DIAGNOSIS — I700 Atherosclerosis of aorta: Secondary | ICD-10-CM | POA: Diagnosis not present

## 2021-09-28 DIAGNOSIS — E1169 Type 2 diabetes mellitus with other specified complication: Secondary | ICD-10-CM | POA: Diagnosis not present

## 2021-09-28 DIAGNOSIS — Z7984 Long term (current) use of oral hypoglycemic drugs: Secondary | ICD-10-CM | POA: Diagnosis not present

## 2021-12-07 DIAGNOSIS — H35033 Hypertensive retinopathy, bilateral: Secondary | ICD-10-CM | POA: Diagnosis not present

## 2021-12-07 DIAGNOSIS — H25813 Combined forms of age-related cataract, bilateral: Secondary | ICD-10-CM | POA: Diagnosis not present

## 2021-12-07 DIAGNOSIS — H40023 Open angle with borderline findings, high risk, bilateral: Secondary | ICD-10-CM | POA: Diagnosis not present

## 2021-12-07 DIAGNOSIS — E119 Type 2 diabetes mellitus without complications: Secondary | ICD-10-CM | POA: Diagnosis not present

## 2022-01-14 DIAGNOSIS — M25572 Pain in left ankle and joints of left foot: Secondary | ICD-10-CM | POA: Diagnosis not present

## 2022-01-14 DIAGNOSIS — T148XXA Other injury of unspecified body region, initial encounter: Secondary | ICD-10-CM | POA: Diagnosis not present

## 2022-01-15 DIAGNOSIS — M25572 Pain in left ankle and joints of left foot: Secondary | ICD-10-CM | POA: Diagnosis not present

## 2022-01-15 DIAGNOSIS — E119 Type 2 diabetes mellitus without complications: Secondary | ICD-10-CM | POA: Diagnosis not present

## 2022-01-15 DIAGNOSIS — E7800 Pure hypercholesterolemia, unspecified: Secondary | ICD-10-CM | POA: Diagnosis not present

## 2022-01-15 DIAGNOSIS — I10 Essential (primary) hypertension: Secondary | ICD-10-CM | POA: Diagnosis not present

## 2022-01-15 DIAGNOSIS — S93402A Sprain of unspecified ligament of left ankle, initial encounter: Secondary | ICD-10-CM | POA: Diagnosis not present

## 2022-01-25 DIAGNOSIS — S93409A Sprain of unspecified ligament of unspecified ankle, initial encounter: Secondary | ICD-10-CM | POA: Diagnosis not present

## 2022-02-19 DIAGNOSIS — S93492A Sprain of other ligament of left ankle, initial encounter: Secondary | ICD-10-CM | POA: Diagnosis not present

## 2022-02-23 DIAGNOSIS — M25572 Pain in left ankle and joints of left foot: Secondary | ICD-10-CM | POA: Diagnosis not present

## 2022-02-24 DIAGNOSIS — I2699 Other pulmonary embolism without acute cor pulmonale: Secondary | ICD-10-CM | POA: Diagnosis not present

## 2022-02-24 DIAGNOSIS — E119 Type 2 diabetes mellitus without complications: Secondary | ICD-10-CM | POA: Diagnosis not present

## 2022-02-24 DIAGNOSIS — I2510 Atherosclerotic heart disease of native coronary artery without angina pectoris: Secondary | ICD-10-CM | POA: Diagnosis not present

## 2022-02-24 DIAGNOSIS — I471 Supraventricular tachycardia: Secondary | ICD-10-CM | POA: Diagnosis not present

## 2022-02-24 DIAGNOSIS — I10 Essential (primary) hypertension: Secondary | ICD-10-CM | POA: Diagnosis not present

## 2022-02-26 DIAGNOSIS — E785 Hyperlipidemia, unspecified: Secondary | ICD-10-CM | POA: Diagnosis not present

## 2022-02-26 DIAGNOSIS — I10 Essential (primary) hypertension: Secondary | ICD-10-CM | POA: Diagnosis not present

## 2022-02-26 DIAGNOSIS — E119 Type 2 diabetes mellitus without complications: Secondary | ICD-10-CM | POA: Diagnosis not present

## 2022-03-01 DIAGNOSIS — M25572 Pain in left ankle and joints of left foot: Secondary | ICD-10-CM | POA: Diagnosis not present

## 2022-03-03 DIAGNOSIS — M25572 Pain in left ankle and joints of left foot: Secondary | ICD-10-CM | POA: Diagnosis not present

## 2022-03-04 ENCOUNTER — Other Ambulatory Visit: Payer: Self-pay | Admitting: Orthopaedic Surgery

## 2022-03-04 DIAGNOSIS — M25572 Pain in left ankle and joints of left foot: Secondary | ICD-10-CM

## 2022-03-08 DIAGNOSIS — N433 Hydrocele, unspecified: Secondary | ICD-10-CM | POA: Diagnosis not present

## 2022-03-08 DIAGNOSIS — N5201 Erectile dysfunction due to arterial insufficiency: Secondary | ICD-10-CM | POA: Diagnosis not present

## 2022-03-16 ENCOUNTER — Ambulatory Visit
Admission: RE | Admit: 2022-03-16 | Discharge: 2022-03-16 | Disposition: A | Discharge status: Home / Self Care | Payer: Medicare Other | Source: Ambulatory Visit | Attending: Orthopaedic Surgery | Admitting: Orthopaedic Surgery

## 2022-03-16 DIAGNOSIS — M19072 Primary osteoarthritis, left ankle and foot: Secondary | ICD-10-CM | POA: Diagnosis not present

## 2022-03-16 DIAGNOSIS — I739 Peripheral vascular disease, unspecified: Secondary | ICD-10-CM | POA: Diagnosis not present

## 2022-03-16 DIAGNOSIS — M25572 Pain in left ankle and joints of left foot: Secondary | ICD-10-CM

## 2022-03-16 DIAGNOSIS — M7732 Calcaneal spur, left foot: Secondary | ICD-10-CM | POA: Diagnosis not present

## 2022-03-16 DIAGNOSIS — R600 Localized edema: Secondary | ICD-10-CM | POA: Diagnosis not present

## 2022-03-19 DIAGNOSIS — M19072 Primary osteoarthritis, left ankle and foot: Secondary | ICD-10-CM | POA: Diagnosis not present

## 2022-03-19 DIAGNOSIS — S93402A Sprain of unspecified ligament of left ankle, initial encounter: Secondary | ICD-10-CM | POA: Diagnosis not present

## 2022-03-19 DIAGNOSIS — M7662 Achilles tendinitis, left leg: Secondary | ICD-10-CM | POA: Diagnosis not present

## 2022-03-22 DIAGNOSIS — K4020 Bilateral inguinal hernia, without obstruction or gangrene, not specified as recurrent: Secondary | ICD-10-CM | POA: Diagnosis not present

## 2022-03-22 DIAGNOSIS — N433 Hydrocele, unspecified: Secondary | ICD-10-CM | POA: Diagnosis not present

## 2022-03-22 DIAGNOSIS — K429 Umbilical hernia without obstruction or gangrene: Secondary | ICD-10-CM | POA: Diagnosis not present

## 2022-03-22 DIAGNOSIS — K5790 Diverticulosis of intestine, part unspecified, without perforation or abscess without bleeding: Secondary | ICD-10-CM | POA: Diagnosis not present

## 2022-03-29 DIAGNOSIS — R21 Rash and other nonspecific skin eruption: Secondary | ICD-10-CM | POA: Diagnosis not present

## 2022-03-31 DIAGNOSIS — Z5181 Encounter for therapeutic drug level monitoring: Secondary | ICD-10-CM | POA: Diagnosis not present

## 2022-03-31 DIAGNOSIS — Z0000 Encounter for general adult medical examination without abnormal findings: Secondary | ICD-10-CM | POA: Diagnosis not present

## 2022-03-31 DIAGNOSIS — Z23 Encounter for immunization: Secondary | ICD-10-CM | POA: Diagnosis not present

## 2022-03-31 DIAGNOSIS — E7800 Pure hypercholesterolemia, unspecified: Secondary | ICD-10-CM | POA: Diagnosis not present

## 2022-03-31 DIAGNOSIS — I700 Atherosclerosis of aorta: Secondary | ICD-10-CM | POA: Diagnosis not present

## 2022-03-31 DIAGNOSIS — Z1331 Encounter for screening for depression: Secondary | ICD-10-CM | POA: Diagnosis not present

## 2022-03-31 DIAGNOSIS — E1169 Type 2 diabetes mellitus with other specified complication: Secondary | ICD-10-CM | POA: Diagnosis not present

## 2022-03-31 DIAGNOSIS — I10 Essential (primary) hypertension: Secondary | ICD-10-CM | POA: Diagnosis not present

## 2022-03-31 DIAGNOSIS — Z7984 Long term (current) use of oral hypoglycemic drugs: Secondary | ICD-10-CM | POA: Diagnosis not present

## 2022-04-05 ENCOUNTER — Other Ambulatory Visit: Payer: Medicare Other

## 2022-04-16 DIAGNOSIS — K4020 Bilateral inguinal hernia, without obstruction or gangrene, not specified as recurrent: Secondary | ICD-10-CM | POA: Diagnosis not present

## 2022-04-16 DIAGNOSIS — N433 Hydrocele, unspecified: Secondary | ICD-10-CM | POA: Diagnosis not present

## 2022-04-19 DIAGNOSIS — S93402A Sprain of unspecified ligament of left ankle, initial encounter: Secondary | ICD-10-CM | POA: Diagnosis not present

## 2022-05-11 DIAGNOSIS — K4020 Bilateral inguinal hernia, without obstruction or gangrene, not specified as recurrent: Secondary | ICD-10-CM | POA: Diagnosis not present

## 2022-05-11 DIAGNOSIS — N433 Hydrocele, unspecified: Secondary | ICD-10-CM | POA: Diagnosis not present

## 2022-05-11 DIAGNOSIS — K429 Umbilical hernia without obstruction or gangrene: Secondary | ICD-10-CM | POA: Diagnosis not present

## 2022-05-28 DIAGNOSIS — I2510 Atherosclerotic heart disease of native coronary artery without angina pectoris: Secondary | ICD-10-CM | POA: Diagnosis not present

## 2022-05-28 DIAGNOSIS — I2699 Other pulmonary embolism without acute cor pulmonale: Secondary | ICD-10-CM | POA: Diagnosis not present

## 2022-05-28 DIAGNOSIS — I471 Supraventricular tachycardia: Secondary | ICD-10-CM | POA: Diagnosis not present

## 2022-05-28 DIAGNOSIS — I10 Essential (primary) hypertension: Secondary | ICD-10-CM | POA: Diagnosis not present

## 2022-06-02 DIAGNOSIS — Z23 Encounter for immunization: Secondary | ICD-10-CM | POA: Diagnosis not present

## 2022-06-14 DIAGNOSIS — N433 Hydrocele, unspecified: Secondary | ICD-10-CM | POA: Diagnosis not present

## 2022-06-25 DIAGNOSIS — Z23 Encounter for immunization: Secondary | ICD-10-CM | POA: Diagnosis not present

## 2022-08-04 ENCOUNTER — Encounter: Payer: Self-pay | Admitting: Primary Care

## 2022-08-04 ENCOUNTER — Ambulatory Visit (INDEPENDENT_AMBULATORY_CARE_PROVIDER_SITE_OTHER): Payer: Medicare Other | Admitting: Primary Care

## 2022-08-04 VITALS — BP 136/90 | HR 69 | Ht 70.0 in | Wt 275.4 lb

## 2022-08-04 DIAGNOSIS — J449 Chronic obstructive pulmonary disease, unspecified: Secondary | ICD-10-CM | POA: Diagnosis not present

## 2022-08-04 DIAGNOSIS — G4733 Obstructive sleep apnea (adult) (pediatric): Secondary | ICD-10-CM | POA: Diagnosis not present

## 2022-08-04 NOTE — Assessment & Plan Note (Addendum)
-   Overall minimally symptomatic. Not on maintenance inhalers. He keeps a chronic cough which is not bothersome. No purulence. Lungs were clear on exam without rhonchi or wheezing. Advised patient notify office if he develops worsening or acute respiratory symptoms. Patient received influenza and covid vaccine this year.

## 2022-08-04 NOTE — Progress Notes (Signed)
$'@Patient'M$  ID: Illene Ochoa., male    DOB: December 16, 1945, 76 y.o.   MRN: [id number]  Chief Complaint  Patient presents with   Follow-up    Referring provider: Seward Carol, MD  HPI: 76 year old male, former smoker quit in 2012 (60-pack-year history).  Past medical history significant for hypertension, coronary artery disease, pulmonary embolism, chronic diastolic heart failure, obstructive sleep apnea, COPD mixed type, type 2 diabetes.  Patient of Dr. Annamaria Boots, last seen in office on 05/06/2021.  08/04/2022 Patient presents today for overdue follow-up for OSA/COPD mixed.  He is compliant with CPAP use.  No issues with mask fit or pressure settings.  He gets 6-7 hours of sleep a night. He needs new CPAP machine as current one is greater than 84 years old.  DME company is [facility].   Patient is asymptomatic respiratory wise. He has chronic cough that is not bothersome to him. No purulence. He is not on maintenance bronchodilator regimen.  Chest x-ray in August 2022 showed chronic bronchitic changes with bibasilar atelectasis.  Old granulomatous/ scarring disease. Denies shortness of breath or wheezing.   Airview download 07/05/22-08/03/22 Usage 29/30 days; 93% > 4 hours  Average usage 6 hours 46 mins Pressure 5-20cm h20 (13.5cm h20-95%) Airleaks 28.5L/min (95%) AHI 0.6   Allergies  Allergen Reactions   Lisinopril Cough    Immunization History  Administered Date(s) Administered   Influenza Split 06/07/2011, 07/20/2012, 06/21/2017   Influenza,inj,Quad PF,6+ Mos 07/18/2013, 06/04/2014   Influenza-Unspecified 06/27/2018   PFIZER Comirnaty(Gray Top)Covid-19 Tri-Sucrose Vaccine 12/23/2020   PFIZER(Purple Top)SARS-COV-2 Vaccination 09/25/2019, 10/16/2019   Pfizer Covid-19 Vaccine Bivalent Booster 53yr & up 06/05/2021   Pneumococcal Polysaccharide-23 02/07/2014    Past Medical History:  Diagnosis Date   Abdominal pain, right upper quadrant    Allergic rhinitis    Arthritis     Asthma    as child   Atrial enlargement, left y-2   Benign localized hyperplasia of prostate with urinary obstruction and other lower urinary tract symptoms (LUTS)(600.21)    Calculus of gallbladder without mention of cholecystitis or obstruction    CHF ([place]estive heart failure) [place]    managed by Dr HTerrence Dupont   COPD (chronic obstructive pulmonary disease) ([facility]    Diabetes mellitus    DVT (deep venous thrombosis) (HCC)    Hematuria, unspecified    HTN (hypertension)    Hyperlipidemia    Hypertension    Pulmonary embolism (HCC)    on xarelto    Sleep apnea    NPSG 03-17-98, cpap 14   Type 2 diabetes mellitus ([place]     Tobacco History: Social History   Tobacco Use  Smoking Status Former   Packs/day: 2.00   Years: 30.00   Total pack years: 60.00   Types: Cigarettes   Quit date: 04/13/2011   Years since quitting: 11.3  Smokeless Tobacco Never   Counseling given: Not Answered   Outpatient Medications Prior to Visit  Medication Sig Dispense Refill   acetaminophen (TYLENOL) 325 MG tablet Take 325-650 mg by mouth every 6 (six) hours as needed for mild pain or headache.     amiodarone (PACERONE) 200 MG tablet Take 100 mg by mouth every Monday, Wednesday, and Friday.   2   amLODipine (NORVASC) 5 MG tablet Take 1 tablet (5 mg total) by mouth daily. 30 tablet 2   atorvastatin (LIPITOR) 40 MG tablet TAKE 1 TABLET( 40 MG TOTAL) BY MOUTH EVERY EVENING (Patient taking differently: Take 40 mg by mouth daily at 6 PM.  TAKE 1 TABLET( 40 MG TOTAL) BY MOUTH EVERY EVENING) 30 tablet 3   COVID-19 mRNA bivalent vaccine, Pfizer, (PFIZER COVID-19 VAC BIVALENT) injection Inject into the muscle. 0.3 mL 0   finasteride (PROSCAR) 5 MG tablet Take 5 mg by mouth daily.   5   furosemide (LASIX) 40 MG tablet Take 20 mg by mouth daily.   3   glimepiride (AMARYL) 4 MG tablet Take 4 mg by mouth daily with breakfast.   8   latanoprost (XALATAN) 0.005 % ophthalmic solution Place 1 drop into both eyes at  bedtime.     metFORMIN (GLUCOPHAGE) 500 MG tablet Take 1 tablet (500 mg total) by mouth 3 (three) times daily. (Patient taking differently: Take 1,000 mg by mouth 2 (two) times daily with a meal.) 90 tablet 3   nitroGLYCERIN (NITROSTAT) 0.4 MG SL tablet Place 0.4 mg under the tongue every 5 (five) minutes as needed for chest pain.      polyethylene glycol (MIRALAX / GLYCOLAX) packet Take 17 g by mouth daily. (Patient taking differently: Take 17 g by mouth daily as needed for moderate constipation.) 14 each 0   rivaroxaban (XARELTO) 20 MG TABS tablet Take 1 tablet (20 mg total) by mouth daily with supper. (Patient taking differently: Take 20 mg by mouth daily with supper.) 30 tablet 5   No facility-administered medications prior to visit.    Review of Systems  Review of Systems  Constitutional: Negative.   HENT: Negative.    Respiratory:  Negative for apnea, chest tightness and shortness of breath.   Cardiovascular: Negative.    Physical Exam  BP (!) 136/90 (BP Location: Left Arm, Cuff Size: Normal)   Pulse 69   Ht '5\' 10"'$  (1.778 m)   Wt 275 lb 6.4 oz (124.9 kg)   SpO2 96%   BMI 39.52 kg/m  Physical Exam Constitutional:      Appearance: Normal appearance.  HENT:     Head: Normocephalic and atraumatic.     Mouth/Throat:     Mouth: Mucous membranes are moist.     Pharynx: Oropharynx is clear.  Cardiovascular:     Rate and Rhythm: Normal rate and regular rhythm.  Pulmonary:     Effort: Pulmonary effort is normal.     Breath sounds: Normal breath sounds.     Comments: CTA Musculoskeletal:        General: Normal range of motion.  Skin:    General: Skin is warm and dry.  Neurological:     General: No focal deficit present.     Mental Status: He is alert and oriented to person, place, and time. Mental status is at baseline.  Psychiatric:        Mood and Affect: Mood normal.        Behavior: Behavior normal.        Thought Content: Thought content normal.        Judgment:  Judgment normal.      Lab Results:  CBC    Component Value Date/Time   WBC 14.3 (H) 12/09/2018 0406   RBC 4.33 12/09/2018 0406   HGB 12.2 (L) 12/09/2018 0406   HCT 34.9 (L) 12/09/2018 0406   PLT 155 12/09/2018 0406   MCV 80.6 12/09/2018 0406   MCH 28.2 12/09/2018 0406   MCHC 35.0 12/09/2018 0406   RDW 15.9 (H) 12/09/2018 0406   LYMPHSABS 1.3 09/13/2018 1438   MONOABS 1.7 (H) 09/13/2018 1438   EOSABS 0.0 09/13/2018 1438   BASOSABS 0.0 09/13/2018  1438    BMET    Component Value Date/Time   NA 136 12/09/2018 0406   K 4.1 12/09/2018 0406   CL 103 12/09/2018 0406   CO2 24 12/09/2018 0406   GLUCOSE 155 (H) 12/09/2018 0406   BUN 13 12/09/2018 0406   CREATININE 1.01 12/09/2018 0406   CREATININE 1.08 03/19/2015 1128   CALCIUM 8.9 12/09/2018 0406   GFRNONAA >60 12/09/2018 0406   GFRNONAA 70 03/19/2015 1128   GFRAA >60 12/09/2018 0406   GFRAA 81 03/19/2015 1128    BNP    Component Value Date/Time   BNP 31.6 09/11/2018 0622    ProBNP    Component Value Date/Time   PROBNP 124.7 07/01/2012 2032    Imaging: No results found.   Assessment & Plan:   OBSTRUCTIVE SLEEP APNEA - Patient is compliant with CPAP use and reports benefit from wearing. Events are well controlled on current pressure settings 5-20cm h20. Needs order for replacement CPAP machine sent to [place]. FU in 1 year or sooner as needed.   COPD mixed type ([place]) - Overall minimally symptomatic. Not on maintenance inhalers. He keeps a chronic cough which is not bothersome. No purulence. Lungs were clear on exam without rhonchi or wheezing. Advised patient notify office if he develops worsening or acute respiratory symptoms. Patient received influenza and covid vaccine this year.    Martyn Ehrich, NP 08/04/2022

## 2022-08-04 NOTE — Patient Instructions (Addendum)
Blood pressure was elevated today, please contact Dr. Delfina Redwood   Recommendations: - Continue to wear CPAP every night for minimum 4 to 6 hours or longer - Work on weight loss efforts, do not drive if tired, avoid alcohol or sedating medication at bedtime  - Contact primary care doctor to discuss blood pressure medication  Orders - New CPAP auto 5 to 20 cm H2O  Follow-up - 1 year with Dr. Annamaria Boots or sooner if needed  CPAP and BIPAP Information CPAP and BIPAP are methods that use air pressure to keep your airways open and to help you breathe well. CPAP and BIPAP use different amounts of pressure. Your health care provider will tell you whether CPAP or BIPAP would be more helpful for you. CPAP stands for "continuous positive airway pressure." With CPAP, the amount of pressure stays the same while you breathe in (inhale) and out (exhale). BIPAP stands for "bi-level positive airway pressure." With BIPAP, the amount of pressure will be higher when you inhale and lower when you exhale. This allows you to take larger breaths. CPAP or BIPAP may be used in the hospital, or your health care provider may want you to use it at home. You may need to have a sleep study before your health care provider can order a machine for you to use at home. What are the advantages? CPAP or BIPAP can be helpful if you have: Sleep apnea. Chronic obstructive pulmonary disease (COPD). Heart failure. Medical conditions that cause muscle weakness, including muscular dystrophy or amyotrophic lateral sclerosis (ALS). Other problems that cause breathing to be shallow, weak, abnormal, or difficult. CPAP and BIPAP are most commonly used for obstructive sleep apnea (OSA) to keep the airways from collapsing when the muscles relax during sleep. What are the risks? Generally, this is a safe treatment. However, problems may occur, including: Irritated skin or skin sores if the mask does not fit properly. Dry or stuffy nose or  nosebleeds. Dry mouth. Feeling gassy or bloated. Sinus or lung infection if the equipment is not cleaned properly. When should CPAP or BIPAP be used? In most cases, the mask only needs to be worn during sleep. Generally, the mask needs to be worn throughout the night and during any daytime naps. People with certain medical conditions may also need to wear the mask at other times, such as when they are awake. Follow instructions from your health care provider about when to use the machine. What happens during CPAP or BIPAP?  Both CPAP and BIPAP are provided by a small machine with a flexible plastic tube that attaches to a plastic mask that you wear. Air is blown through the mask into your nose or mouth. The amount of pressure that is used to blow the air can be adjusted on the machine. Your health care provider will set the pressure setting and help you find the best mask for you. Tips for using the mask Because the mask needs to be snug, some people feel trapped or closed-in (claustrophobic) when first using the mask. If you feel this way, you may need to get used to the mask. One way to do this is to hold the mask loosely over your nose or mouth and then gradually apply the mask more snugly. You can also gradually increase the amount of time that you use the mask. Masks are available in various types and sizes. If your mask does not fit well, talk with your health care provider about getting a different one. Some  common types of masks include: Full face masks, which fit over the mouth and nose. Nasal masks, which fit over the nose. Nasal pillow or prong masks, which fit into the nostrils. If you are using a mask that fits over your nose and you tend to breathe through your mouth, a chin strap may be applied to help keep your mouth closed. Use a skin barrier to protect your skin as told by your health care provider. Some CPAP and BIPAP machines have alarms that may sound if the mask comes off or  develops a leak. If you have trouble with the mask, it is very important that you talk with your health care provider about finding a way to make the mask easier to tolerate. Do not stop using the mask. There could be a negative impact on your health if you stop using the mask. Tips for using the machine Place your CPAP or BIPAP machine on a secure table or stand near an electrical outlet. Know where the on/off switch is on the machine. Follow instructions from your health care provider about how to set the pressure on your machine and when you should use it. Do not eat or drink while the CPAP or BIPAP machine is on. Food or fluids could get pushed into your lungs by the pressure of the CPAP or BIPAP. For home use, CPAP and BIPAP machines can be rented or purchased through home health care companies. Many different brands of machines are available. Renting a machine before purchasing may help you find out which particular machine works well for you. Your health insurance company may also decide which machine you may get. Keep the CPAP or BIPAP machine and attachments clean. Ask your health care provider for specific instructions. Check the humidifier if you have a dry stuffy nose or nosebleeds. Make sure it is working correctly. Follow these instructions at home: Take over-the-counter and prescription medicines only as told by your health care provider. Ask if you can take sinus medicine if your sinuses are blocked. Do not use any products that contain nicotine or tobacco. These products include cigarettes, chewing tobacco, and vaping devices, such as e-cigarettes. If you need help quitting, ask your health care provider. Keep all follow-up visits. This is important. Contact a health care provider if: You have redness or pressure sores on your head, face, mouth, or nose from the mask or head gear. You have trouble using the CPAP or BIPAP machine. You cannot tolerate wearing the CPAP or BIPAP  mask. Someone tells you that you snore even when wearing your CPAP or BIPAP. Get help right away if: You have trouble breathing. You feel confused. Summary CPAP and BIPAP are methods that use air pressure to keep your airways open and to help you breathe well. If you have trouble with the mask, it is very important that you talk with your health care provider about finding a way to make the mask easier to tolerate. Do not stop using the mask. There could be a negative impact to your health if you stop using the mask. Follow instructions from your health care provider about when to use the machine. This information is not intended to replace advice given to you by your health care provider. Make sure you discuss any questions you have with your health care provider. Document Revised: 04/01/2021 Document Reviewed: 08/01/2020 Elsevier Patient Education  [place].

## 2022-08-04 NOTE — Assessment & Plan Note (Signed)
-   Patient is compliant with CPAP use and reports benefit from wearing. Events are well controlled on current pressure settings 5-20cm h20. Needs order for replacement CPAP machine sent to [place]. FU in 1 year or sooner as needed.

## 2022-08-10 DIAGNOSIS — Z03818 Encounter for observation for suspected exposure to other biological agents ruled out: Secondary | ICD-10-CM | POA: Diagnosis not present

## 2022-08-10 DIAGNOSIS — J069 Acute upper respiratory infection, unspecified: Secondary | ICD-10-CM | POA: Diagnosis not present

## 2022-08-24 DIAGNOSIS — L282 Other prurigo: Secondary | ICD-10-CM | POA: Diagnosis not present

## 2022-08-24 DIAGNOSIS — E1169 Type 2 diabetes mellitus with other specified complication: Secondary | ICD-10-CM | POA: Diagnosis not present

## 2022-09-15 DIAGNOSIS — L299 Pruritus, unspecified: Secondary | ICD-10-CM | POA: Diagnosis not present

## 2022-09-15 DIAGNOSIS — Q800 Ichthyosis vulgaris: Secondary | ICD-10-CM | POA: Diagnosis not present

## 2022-09-15 DIAGNOSIS — L853 Xerosis cutis: Secondary | ICD-10-CM | POA: Diagnosis not present

## 2022-09-29 DIAGNOSIS — I739 Peripheral vascular disease, unspecified: Secondary | ICD-10-CM | POA: Diagnosis not present

## 2022-09-29 DIAGNOSIS — Z23 Encounter for immunization: Secondary | ICD-10-CM | POA: Diagnosis not present

## 2022-09-29 DIAGNOSIS — Z86711 Personal history of pulmonary embolism: Secondary | ICD-10-CM | POA: Diagnosis not present

## 2022-09-29 DIAGNOSIS — E1169 Type 2 diabetes mellitus with other specified complication: Secondary | ICD-10-CM | POA: Diagnosis not present

## 2022-09-29 DIAGNOSIS — I700 Atherosclerosis of aorta: Secondary | ICD-10-CM | POA: Diagnosis not present

## 2022-09-29 DIAGNOSIS — E7800 Pure hypercholesterolemia, unspecified: Secondary | ICD-10-CM | POA: Diagnosis not present

## 2022-09-29 DIAGNOSIS — D6869 Other thrombophilia: Secondary | ICD-10-CM | POA: Diagnosis not present

## 2022-09-29 DIAGNOSIS — D649 Anemia, unspecified: Secondary | ICD-10-CM | POA: Diagnosis not present

## 2022-09-29 DIAGNOSIS — I10 Essential (primary) hypertension: Secondary | ICD-10-CM | POA: Diagnosis not present

## 2022-09-29 DIAGNOSIS — I480 Paroxysmal atrial fibrillation: Secondary | ICD-10-CM | POA: Diagnosis not present

## 2022-09-29 DIAGNOSIS — G4730 Sleep apnea, unspecified: Secondary | ICD-10-CM | POA: Diagnosis not present

## 2022-10-01 DIAGNOSIS — I2699 Other pulmonary embolism without acute cor pulmonale: Secondary | ICD-10-CM | POA: Diagnosis not present

## 2022-10-01 DIAGNOSIS — I10 Essential (primary) hypertension: Secondary | ICD-10-CM | POA: Diagnosis not present

## 2022-10-01 DIAGNOSIS — I2510 Atherosclerotic heart disease of native coronary artery without angina pectoris: Secondary | ICD-10-CM | POA: Diagnosis not present

## 2022-10-01 DIAGNOSIS — I4589 Other specified conduction disorders: Secondary | ICD-10-CM | POA: Diagnosis not present

## 2022-10-11 DIAGNOSIS — N433 Hydrocele, unspecified: Secondary | ICD-10-CM | POA: Diagnosis not present

## 2022-10-29 DIAGNOSIS — N430 Encysted hydrocele: Secondary | ICD-10-CM | POA: Diagnosis not present

## 2022-11-03 DIAGNOSIS — N433 Hydrocele, unspecified: Secondary | ICD-10-CM | POA: Diagnosis not present

## 2022-11-05 ENCOUNTER — Encounter (HOSPITAL_COMMUNITY)
Admission: RE | Disposition: A | Discharge status: Home / Self Care | Payer: Self-pay | Source: Home / Self Care | Attending: Urology

## 2022-11-05 ENCOUNTER — Encounter (HOSPITAL_COMMUNITY): Payer: Self-pay | Admitting: Urology

## 2022-11-05 ENCOUNTER — Ambulatory Visit (HOSPITAL_COMMUNITY)
Admission: RE | Admit: 2022-11-05 | Discharge: 2022-11-05 | Disposition: A | Discharge status: Home / Self Care | Payer: Medicare Other | Attending: Urology | Admitting: Urology

## 2022-11-05 ENCOUNTER — Other Ambulatory Visit: Payer: Self-pay | Admitting: Urology

## 2022-11-05 ENCOUNTER — Ambulatory Visit (HOSPITAL_BASED_OUTPATIENT_CLINIC_OR_DEPARTMENT_OTHER): Payer: Medicare Other | Admitting: Certified Registered Nurse Anesthetist

## 2022-11-05 ENCOUNTER — Ambulatory Visit (HOSPITAL_COMMUNITY): Payer: Medicare Other | Admitting: Certified Registered Nurse Anesthetist

## 2022-11-05 DIAGNOSIS — Z9889 Other specified postprocedural states: Secondary | ICD-10-CM | POA: Insufficient documentation

## 2022-11-05 DIAGNOSIS — Z7901 Long term (current) use of anticoagulants: Secondary | ICD-10-CM | POA: Insufficient documentation

## 2022-11-05 DIAGNOSIS — S3022XA Contusion of scrotum and testes, initial encounter: Secondary | ICD-10-CM | POA: Diagnosis not present

## 2022-11-05 DIAGNOSIS — G4730 Sleep apnea, unspecified: Secondary | ICD-10-CM | POA: Diagnosis not present

## 2022-11-05 DIAGNOSIS — E119 Type 2 diabetes mellitus without complications: Secondary | ICD-10-CM | POA: Insufficient documentation

## 2022-11-05 DIAGNOSIS — I10 Essential (primary) hypertension: Secondary | ICD-10-CM | POA: Insufficient documentation

## 2022-11-05 DIAGNOSIS — X58XXXA Exposure to other specified factors, initial encounter: Secondary | ICD-10-CM | POA: Insufficient documentation

## 2022-11-05 DIAGNOSIS — Z87891 Personal history of nicotine dependence: Secondary | ICD-10-CM | POA: Insufficient documentation

## 2022-11-05 DIAGNOSIS — I2510 Atherosclerotic heart disease of native coronary artery without angina pectoris: Secondary | ICD-10-CM | POA: Insufficient documentation

## 2022-11-05 DIAGNOSIS — Z01818 Encounter for other preprocedural examination: Secondary | ICD-10-CM

## 2022-11-05 DIAGNOSIS — J449 Chronic obstructive pulmonary disease, unspecified: Secondary | ICD-10-CM | POA: Diagnosis not present

## 2022-11-05 DIAGNOSIS — Z955 Presence of coronary angioplasty implant and graft: Secondary | ICD-10-CM | POA: Insufficient documentation

## 2022-11-05 DIAGNOSIS — Z7984 Long term (current) use of oral hypoglycemic drugs: Secondary | ICD-10-CM | POA: Diagnosis not present

## 2022-11-05 DIAGNOSIS — N430 Encysted hydrocele: Secondary | ICD-10-CM

## 2022-11-05 DIAGNOSIS — N99840 Postprocedural hematoma of a genitourinary system organ or structure following a genitourinary system procedure: Secondary | ICD-10-CM | POA: Diagnosis not present

## 2022-11-05 HISTORY — PX: ORCHIECTOMY: SHX2116

## 2022-11-05 LAB — COMPREHENSIVE METABOLIC PANEL
ALT: 14 U/L (ref 0–44)
AST: 18 U/L (ref 15–41)
Albumin: 3.7 g/dL (ref 3.5–5.0)
Alkaline Phosphatase: 41 U/L (ref 38–126)
Anion gap: 11 (ref 5–15)
BUN: 15 mg/dL (ref 8–23)
CO2: 24 mmol/L (ref 22–32)
Calcium: 9.1 mg/dL (ref 8.9–10.3)
Chloride: 106 mmol/L (ref 98–111)
Creatinine, Ser: 1.11 mg/dL (ref 0.61–1.24)
GFR, Estimated: >60 mL/min (ref >60)
Glucose, Bld: 78 mg/dL (ref 70–99)
Potassium: 3.8 mmol/L (ref 3.5–5.1)
Sodium: 141 mmol/L (ref 135–145)
Total Bilirubin: 1 mg/dL (ref 0.3–1.2)
Total Protein: 7.8 g/dL (ref 6.5–8.1)

## 2022-11-05 LAB — CBC
HCT: 33.6 % — ABNORMAL LOW (ref 39.0–52.0)
Hemoglobin: 11.5 g/dL — ABNORMAL LOW (ref 13.0–17.0)
MCH: 26.7 pg (ref 26.0–34.0)
MCHC: 34.2 g/dL (ref 30.0–36.0)
MCV: 78.1 fL — ABNORMAL LOW (ref 80.0–100.0)
Platelets: 224 10*3/uL (ref 150–400)
RBC: 4.3 MIL/uL (ref 4.22–5.81)
RDW: 16.7 % — ABNORMAL HIGH (ref 11.5–15.5)
WBC: 8.9 10*3/uL (ref 4.0–10.5)
nRBC: 0 % (ref 0.0–0.2)

## 2022-11-05 LAB — GLUCOSE, CAPILLARY
Glucose-Capillary: 79 mg/dL (ref 70–99)
Glucose-Capillary: 87 mg/dL (ref 70–99)

## 2022-11-05 SURGERY — ORCHIECTOMY
Anesthesia: Regional

## 2022-11-05 MED ORDER — BUPIVACAINE HCL (PF) 0.5 % IJ SOLN
INTRAMUSCULAR | Status: AC
  Filled 2022-11-05: qty 30

## 2022-11-05 MED ORDER — ONDANSETRON HCL 4 MG/2ML IJ SOLN
INTRAMUSCULAR | Status: DC | PRN
  Administered 2022-11-05: 4 mg via INTRAVENOUS

## 2022-11-05 MED ORDER — CEFAZOLIN SODIUM-DEXTROSE 2-4 GM/100ML-% IV SOLN
2.0000 g | INTRAVENOUS | Status: AC
  Administered 2022-11-05: 3 g via INTRAVENOUS
  Filled 2022-11-05: qty 100

## 2022-11-05 MED ORDER — FENTANYL CITRATE (PF) 100 MCG/2ML IJ SOLN
INTRAMUSCULAR | Status: AC
  Filled 2022-11-05: qty 2

## 2022-11-05 MED ORDER — PROPOFOL 10 MG/ML IV BOLUS
INTRAVENOUS | Status: DC | PRN
  Administered 2022-11-05: 160 mg via INTRAVENOUS

## 2022-11-05 MED ORDER — LIDOCAINE 2% (20 MG/ML) 5 ML SYRINGE
INTRAMUSCULAR | Status: DC | PRN
  Administered 2022-11-05: 80 mg via INTRAVENOUS

## 2022-11-05 MED ORDER — BUPIVACAINE HCL 0.5 % IJ SOLN
INTRAMUSCULAR | Status: DC | PRN
  Administered 2022-11-05: 20 mL

## 2022-11-05 MED ORDER — DEXAMETHASONE SODIUM PHOSPHATE 10 MG/ML IJ SOLN
INTRAMUSCULAR | Status: DC | PRN
  Administered 2022-11-05: 10 mg via INTRAVENOUS

## 2022-11-05 MED ORDER — BUPIVACAINE-EPINEPHRINE (PF) 0.25% -1:200000 IJ SOLN
INTRAMUSCULAR | Status: AC
  Filled 2022-11-05: qty 30

## 2022-11-05 MED ORDER — ACETAMINOPHEN 10 MG/ML IV SOLN: 1000.0000 mg | Freq: Once | INTRAVENOUS | Status: DC | PRN

## 2022-11-05 MED ORDER — FENTANYL CITRATE (PF) 100 MCG/2ML IJ SOLN
INTRAMUSCULAR | Status: DC | PRN
  Administered 2022-11-05 (×2): 50 ug via INTRAVENOUS

## 2022-11-05 MED ORDER — FENTANYL CITRATE PF 50 MCG/ML IJ SOSY
25.0000 ug | PREFILLED_SYRINGE | INTRAMUSCULAR | Status: DC | PRN
  Administered 2022-11-05: 50 ug via INTRAVENOUS

## 2022-11-05 MED ORDER — PROPOFOL 10 MG/ML IV BOLUS
INTRAVENOUS | Status: AC
  Filled 2022-11-05: qty 20

## 2022-11-05 MED ORDER — ONDANSETRON HCL 4 MG/2ML IJ SOLN: 4.0000 mg | Freq: Once | INTRAMUSCULAR | Status: DC | PRN

## 2022-11-05 MED ORDER — FENTANYL CITRATE PF 50 MCG/ML IJ SOSY
PREFILLED_SYRINGE | INTRAMUSCULAR | Status: AC
  Filled 2022-11-05: qty 1

## 2022-11-05 MED ORDER — LIDOCAINE HCL (PF) 2 % IJ SOLN
INTRAMUSCULAR | Status: AC
  Filled 2022-11-05: qty 5

## 2022-11-05 MED ORDER — CEFAZOLIN SODIUM-DEXTROSE 1-4 GM/50ML-% IV SOLN
1.0000 g | INTRAVENOUS | Status: DC
  Filled 2022-11-05: qty 50

## 2022-11-05 MED ORDER — OXYCODONE HCL 5 MG PO TABS
5.0000 mg | ORAL_TABLET | Freq: Once | ORAL | Status: AC | PRN
  Administered 2022-11-05: 5 mg via ORAL

## 2022-11-05 MED ORDER — CHLORHEXIDINE GLUCONATE 0.12 % MT SOLN: 15.0000 mL | Freq: Once | OROMUCOSAL | Status: DC

## 2022-11-05 MED ORDER — FENTANYL CITRATE PF 50 MCG/ML IJ SOSY
PREFILLED_SYRINGE | INTRAMUSCULAR | Status: AC
  Administered 2022-11-05: 50 ug via INTRAVENOUS
  Filled 2022-11-05: qty 1

## 2022-11-05 MED ORDER — OXYCODONE HCL 5 MG/5ML PO SOLN: 5.0000 mg | Freq: Once | ORAL | Status: AC | PRN

## 2022-11-05 MED ORDER — OXYCODONE HCL 5 MG PO TABS
ORAL_TABLET | ORAL | Status: AC
  Filled 2022-11-05: qty 1

## 2022-11-05 MED ORDER — LACTATED RINGERS IV SOLN: INTRAVENOUS | Status: DC

## 2022-11-05 SURGICAL SUPPLY — 42 items
ADH SKN CLS APL DERMABOND .7 (GAUZE/BANDAGES/DRESSINGS) ×1
APL SKNCLS STERI-STRIP NONHPOA (GAUZE/BANDAGES/DRESSINGS)
BAG COUNTER SPONGE SURGICOUNT (BAG) IMPLANT
BAG SPNG CNTER NS LX DISP (BAG)
BENZOIN TINCTURE PRP APPL 2/3 (GAUZE/BANDAGES/DRESSINGS) IMPLANT
BNDG GAUZE DERMACEA FLUFF 4 (GAUZE/BANDAGES/DRESSINGS) IMPLANT
BNDG GZE DERMACEA 4 6PLY (GAUZE/BANDAGES/DRESSINGS)
DERMABOND ADVANCED .7 DNX12 (GAUZE/BANDAGES/DRESSINGS) ×2 IMPLANT
DISSECTOR ROUND CHERRY 3/8 STR (MISCELLANEOUS) IMPLANT
DRAIN PENROSE 0.25X18 (DRAIN) IMPLANT
DRAPE LAPAROTOMY T 98X78 PEDS (DRAPES) ×2 IMPLANT
ELECT NDL TIP 2.8 STRL (NEEDLE) ×2 IMPLANT
ELECT NEEDLE TIP 2.8 STRL (NEEDLE) ×1 IMPLANT
GAUZE 4X4 16PLY ~~LOC~~+RFID DBL (SPONGE) IMPLANT
GAUZE SPONGE 4X4 12PLY STRL (GAUZE/BANDAGES/DRESSINGS) IMPLANT
GLOVE SURG LX STRL 7.5 STRW (GLOVE) ×2 IMPLANT
GOWN STRL REUS W/ TWL LRG LVL3 (GOWN DISPOSABLE) ×4 IMPLANT
GOWN STRL REUS W/ TWL XL LVL3 (GOWN DISPOSABLE) ×4 IMPLANT
GOWN STRL REUS W/TWL LRG LVL3 (GOWN DISPOSABLE) ×2
GOWN STRL REUS W/TWL XL LVL3 (GOWN DISPOSABLE) ×2
HEMOSTAT ARISTA ABSORB 3G PWDR (HEMOSTASIS) IMPLANT
KIT BASIN OR (CUSTOM PROCEDURE TRAY) ×2 IMPLANT
NDL HYPO 22X1.5 SAFETY MO (MISCELLANEOUS) ×2 IMPLANT
NEEDLE HYPO 22X1.5 SAFETY MO (MISCELLANEOUS) ×1 IMPLANT
NEEDLE SAFETY HYPO 22GAX1.5 (MISCELLANEOUS) ×1
NS IRRIG 1000ML POUR BTL (IV SOLUTION) ×2 IMPLANT
PACK GENERAL/GYN (CUSTOM PROCEDURE TRAY) ×2 IMPLANT
SPIKE FLUID TRANSFER (MISCELLANEOUS) ×2 IMPLANT
STRIP CLOSURE SKIN 1/2X4 (GAUZE/BANDAGES/DRESSINGS) IMPLANT
SUPPORT SCROTAL LG STRP (MISCELLANEOUS) ×2 IMPLANT
SUT MNCRL AB 4-0 PS2 18 (SUTURE) ×2 IMPLANT
SUT SILK 0 (SUTURE)
SUT SILK 0 30XBRD TIE 6 (SUTURE) IMPLANT
SUT SILK 2 0 (SUTURE) ×1
SUT SILK 2 0 SH CR/8 (SUTURE) ×2 IMPLANT
SUT SILK 2-0 18XBRD TIE 12 (SUTURE) ×2 IMPLANT
SUT VIC AB 2-0 SH 27 (SUTURE) ×3
SUT VIC AB 2-0 SH 27X BRD (SUTURE) IMPLANT
SUT VIC AB 3-0 SH 27 (SUTURE) ×2
SUT VIC AB 3-0 SH 27XBRD (SUTURE) ×4 IMPLANT
TOWEL OR 17X26 10 PK STRL BLUE (TOWEL DISPOSABLE) ×2 IMPLANT
TOWEL OR NON WOVEN STRL DISP B (DISPOSABLE) ×2 IMPLANT

## 2022-11-05 NOTE — Anesthesia Postprocedure Evaluation (Signed)
Anesthesia Post Note  Patient: Matthew Ochoa.  Procedure(s) Performed: scrotal exploration and washout     Patient location during evaluation: PACU Anesthesia Type: General Level of consciousness: awake and alert Pain management: pain level controlled Vital Signs Assessment: post-procedure vital signs reviewed and stable Respiratory status: spontaneous breathing, nonlabored ventilation and respiratory function stable Cardiovascular status: blood pressure returned to baseline and stable Postop Assessment: no apparent nausea or vomiting Anesthetic complications: no   No notable events documented.  Last Vitals:  Vitals:   11/05/22 2015 11/05/22 2030  BP: (!) 185/102   Pulse: 84 80  Resp:    Temp:    SpO2: 92% 93%    Last Pain:  Vitals:   11/05/22 2015  TempSrc:   PainSc: [place]

## 2022-11-05 NOTE — Anesthesia Preprocedure Evaluation (Addendum)
Anesthesia Evaluation  Patient identified by MRN, date of birth, ID band Patient awake    Reviewed: Allergy & Precautions, NPO status , Patient's Chart, lab work & pertinent test results  Airway Mallampati: II  TM Distance: >3 FB Neck ROM: Full    Dental no notable dental hx. (+) Partial Lower, Partial Upper, Missing, Dental Advisory Given, Poor Dentition,    Pulmonary sleep apnea and Continuous Positive Airway Pressure Ventilation , COPD, former smoker (60 pack years quit 2012), PE   Pulmonary exam normal breath sounds clear to auscultation       Cardiovascular hypertension, + CAD  Normal cardiovascular exam Rhythm:Regular Rate:Normal     Neuro/Psych    GI/Hepatic   Endo/Other  diabetes, Type 2    Renal/GU Lab Results      Component                Value               Date                      CREATININE               1.11                11/05/2022                BUN                      15                  11/05/2022                NA                       141                 11/05/2022                K                        3.8                 11/05/2022                CL                       106                 11/05/2022                CO2                      24                  11/05/2022                Musculoskeletal  (+) Arthritis ,    Abdominal   Peds  Hematology Lab Results      Component                Value               Date                      WBC  8.9                 11/05/2022                HGB                      11.5 (L)            11/05/2022                HCT                      33.6 (L)            11/05/2022                MCV                      78.1 (L)            11/05/2022                PLT                      224                 11/05/2022              Anesthesia Other Findings All Lisinopril  Reproductive/Obstetrics                              Anesthesia Physical Anesthesia Plan  ASA: 3  Anesthesia Plan: Regional   Post-op Pain Management: Tylenol PO (pre-op)*   Induction: Intravenous  PONV Risk Score and Plan: Treatment may vary due to age or medical condition and Ondansetron  Airway Management Planned: LMA  Additional Equipment: None  Intra-op Plan:   Post-operative Plan: Extubation in OR  Informed Consent: I have reviewed the patients History and Physical, chart, labs and discussed the procedure including the risks, benefits and alternatives for the proposed anesthesia with the patient or authorized representative who has indicated his/her understanding and acceptance.     Dental advisory given  Plan Discussed with:   Anesthesia Plan Comments:        Anesthesia Quick Evaluation

## 2022-11-05 NOTE — Discharge Instructions (Signed)
Discharge instructions following scrotal surgery  Call your doctor for: Fever is greater than 100.5 Severe nausea or vomiting Increasing pain not controlled by pain medication Increasing redness or drainage from incisions  The number for questions or concerns is [phone]  Activity level: No lifting greater than 10 pounds (about equal to milk) for the next 2 weeks or until cleared to do so at follow-up appointment.  Otherwise activity as tolerated by comfort level.  Diet: May resume your regular diet as tolerated  Driving: No driving while still taking opiate pain medications (weight at least 6-8 hours after last dose).  No driving if you still sore from surgery as it may limit her ability to react quickly if necessary.   Shower/bath: May shower and get incision wet pad dry immediately following.  Do not scrub vigorously for the next 2-3 weeks.  Do not soak incision (ID soaking in bath or swimming) until told he may do so by Dr., as this may promote a wound infection.  Wound care: He may cover wounds with sterile gauze as needed to prevent incisions rubbing on close follow-up in any seepage.  Where tight fitting underpants for at least 2 weeks.  He should apply cold compresses (ice or sac of frozen peas/corn) to your scrotum for at least 48 hours to reduce the swelling.  You should expect that his scrotum will swell up initially and then get smaller over the next 2-4 weeks.  Follow-up appointments: Follow-up appointment will be scheduled with Dr. Louis Meckel in 6 weeks for a wound check.

## 2022-11-05 NOTE — H&P (Signed)
77 year old man who presents today for reevaluation. His last office visit we increased his Trimix dose to what I would consider the maximum strength. We also discussed his enlarged scrotum.   The patient states in the interval that the Trimix has not been successful. He is having less success with the higher dose. He also was having significant pain with it. He is not interested in a penile prosthesis.   The patient is having significant bother from his large scrotum. He also has noted that he has a bulge in his midline above his bellybutton.   8/23: He has undergone a CT scan of the pelvis that demonstrated bilateral inguinal hernias as well as a ventral hernia. He also has bilateral hydroceles, left greater than right. He is not having any significant pain, but is quite bothered by the scrotal swelling. He would like to get both his hernia and his hydroceles fixed at the same time.   The patient had a cardiac stent placed 10 years ago and has not had any difficulty with that since. He sees Dr. Terrence Dupont from cardiology, recently saw him in May. He had an echo within the last year according to the patient, which was normal. He has no difficulty playing golf, plays golf several times a week. Denies any chest pain with exertion or at any other time. He has no shortness of breath.   The patient had a laparoscopic cholecystectomy within the last several years and did well from that.   Interval: Today the patient is here for preop for his bilateral hydroceles. He did see general surgery, they opted not to fix any of his hernias at this time. The patient is here with his wife, and have lots of questions about the upcoming surgery. He denies any significant progression of his scrotal swelling or pain. Denies any voiding symptoms.   10/11/22: He is scheduled to undergo bilateral hydrocelectomy with Dr. Louis Meckel on 10/29/22. He denies changes to his medications or new allergies. He deines new health issues. He  understands the preoperative instructions. He denies fevers, chills and chest pain.   2/27/24Tyrone Nine presents for postoperative office visit and concerns of scrotal swelling and pain. He has a drain in place. This has been draining small amounts of blood. He is not having fevers, but is very uncomfortable. He has been using ice, elevation and good scrotal support. He is very uncomfortable. He rates his pain a 6/10. He is able to void despite significant foreskin edema.   11/05/2022: I spoke with Mr. Schiesser today regarding scrotal explanation and clot evacuation that was discussed with his urologist due to persistent postoperative swelling, pain and clot burden within the scrotum. He has not been running any fevers and has held his Xarelto. He continues to have pain rated 6 out of 10. He understands to remain NPO.     ALLERGIES: No Allergies    MEDICATIONS: Doxycycline Hyclate 100 mg tablet 1 tablet PO BID  Sildenafil Citrate 100 mg tablet 1 tablet PO Daily PRN  AmLODIPine Besylate 5 MG Oral Tablet Oral  Atorvastatin Calcium 40 mg tablet Oral  Finasteride 5 mg tablet 0 Oral  MetFORMIN HCl - 500 MG Oral Tablet Oral  Nitrostat 0.4 mg tablet, sublingual Sublingual  Oxycodone Hcl 5 mg tablet 1 tablet PO Q 8 H PRN for post operative pain  Sildenafil Citrate 100 mg tablet 1 tablet PO PRN  Xarelto 20 mg tablet Oral     GU PSH: Excision Bilateral Hydroceles - 10/29/2022 Penile  Injection - 2021     NON-GU PSH: No Non-GU PSH    GU PMH: Hydrocele, Unspec - 11/03/2022, - 10/11/2022, - 06/14/2022, - 04/16/2022, - 03/08/2022, Hydrocele, right, - 2014, Hydrocele, left, - 2014 Bilat Inguinal Hernia W/O obst or gang, non recurrent - 04/16/2022, - 03/22/2022 ED due to arterial insufficiency - 03/08/2022, - 06/24/2020, - 2019 Encounter for Prostate Cancer screening - 2019 Urinary Tract Inf, Unspec site, Urinary tract infection - 2017 Gross hematuria, Gross hematuria - 2016 Renal cyst, Renal cyst, acquired -  2016 BPH w/LUTS, Benign localized prostatic hyperplasia with lower urinary tract symptoms (LUTS) - 2016 Nocturia, Nocturia - 2016 Urinary Retention, Unspec, Incomplete bladder emptying - 2016 Epididymitis, Epididymitis - 2014 Hematospermia, Hematospermia - 2014 RUQ pain, Abdominal pain, RUQ (right upper quadrant) - 2014 Varicocele, Varicocele - 2014      PMH Notes:  2013-06-30 05:18:09 - Note: Cholelithiasis, unspecified acuity, unspecified acuity  2013-06-11 13:30:08 - Note: Thrombophlebitis Of Deep Vessels Of The Lower Extremity   NON-GU PMH: Encounter for general adult medical examination without abnormal findings, Encounter for preventive health examination - 2016 Congenital single renal cyst, Congenital renal cyst, single - 2014 Personal history of other diseases of the nervous system and sense organs, History of sleep apnea - 2014 Personal history of other endocrine, nutritional and metabolic disease, History of diabetes mellitus - 2014 Personal history of other specified conditions, History of heartburn - 2014    FAMILY HISTORY: Family Health Status - Mother's Age - Mother Father Deceased At Age7 ___ - Mother Lung Cancer - Runs In Family Transient Ischemic Attack - Father   SOCIAL HISTORY: Marital Status: Married Current Smoking Status: Patient does not smoke anymore. Has not smoked since 12/05/2009. Smoked for 25 years. Smoked 1 pack per day.   Tobacco Use Assessment Completed: Used Tobacco in last 30 days? Drinks 4 drinks per year. Types of alcohol consumed: Liquor.  Patient's occupation is/was retired.     Notes: Former smoker, Caffeine Use, Marital History - Currently Married, Alcohol Use, Tobacco Use   VITAL SIGNS:      11/05/2022 10:18 AM  BP 152/71 mmHg  Pulse 73 /min  Temperature 97.3 F / 36.2 C  NAD Nonlabored breathing RRR Large scrotal hematoma  Complexity of Data:  Source Of History:  Patient  Records Review:   Previous Patient Records   05/08/20  05/07/19 01/16/18 01/02/16 12/01/11 06/15/10  PSA  Total PSA 0.64 ng/mL 0.83 ng/mL 0.83 ng/mL 1.22  1.50  3.09     PROCEDURES: None   ASSESSMENT:      ICD-10 Details  1 GU:   Hydrocele, Unspec - N43.3 Chronic, Exacerbation     PLAN:           Document Letter(s):  Created for Patient: Clinical Summary         Notes:   We discussed scrotal exploration and clot evacuation surgery today as well as what to expect over the course of the next few days. I discussed this in detail with his family and his nephew who is a surgical PA. They voiced their understanding to the upcoming procedure. They were agreeable to plan of care moving forward. He is concerned about restarting Xarelto, but understands to remain off of Xarelto until Dr. Louis Meckel tells him to return to taking it.

## 2022-11-05 NOTE — Transfer of Care (Signed)
Immediate Anesthesia Transfer of Care Note  Patient: Matthew Ochoa.  Procedure(s) Performed: scrotal exploration and washout  Patient Location: PACU  Anesthesia Type:General  Level of Consciousness: drowsy and patient cooperative  Airway & Oxygen Therapy: Patient Spontanous Breathing and Patient connected to face mask oxygen  Post-op Assessment: Report given to RN and Post -op Vital signs reviewed and stable  Post vital signs: Reviewed and stable  Last Vitals:  Vitals Value Taken Time  BP 184//83   Temp 97.5   Pulse 72   Resp 12   SpO2 100     Last Pain:  Vitals:   11/05/22 1431  TempSrc:   PainSc: 0-No pain         Complications: No notable events documented.

## 2022-11-05 NOTE — Interval H&P Note (Signed)
History and Physical Interval Note:  11/05/2022 5:04 PM  Matthew Ochoa.  has presented today for surgery, with the diagnosis of scrotal hematoma.  The various methods of treatment have been discussed with the patient and family. After consideration of risks, benefits and other options for treatment, the patient has consented to  Procedure(s): scrotal exploration and washout (N/A) as a surgical intervention.  The patient's history has been reviewed, patient examined, no change in status, stable for surgery.  I have reviewed the patient's chart and labs.  Questions were answered to the patient's satisfaction.     Ardis Hughs

## 2022-11-05 NOTE — Anesthesia Procedure Notes (Signed)
Procedure Name: LMA Insertion Date/Time: 11/05/2022 5:15 PM  Performed by: Gean Maidens, CRNAPre-anesthesia Checklist: Patient identified, Emergency Drugs available, Suction available, Patient being monitored and Timeout performed Patient Re-evaluated:Patient Re-evaluated prior to induction Oxygen Delivery Method: Circle system utilized Preoxygenation: Pre-oxygenation with 100% oxygen Induction Type: IV induction Ventilation: Mask ventilation without difficulty LMA: LMA inserted LMA Size: 5.0 Number of attempts: 1 Placement Confirmation: positive ETCO2 and breath sounds checked- equal and bilateral Tube secured with: Tape Dental Injury: Teeth and Oropharynx as per pre-operative assessment

## 2022-11-05 NOTE — Op Note (Signed)
Preoperative diagnosis:  Scrotal hematoma   Postoperative diagnosis:  same   Procedure: Scrotal exploration hematoma evacuation  Surgeon: Ardis Hughs, MD  Anesthesia: General  Complications: None  Intraoperative findings:  There was some fluid and clot within each hemiscrotum that I evacuated, irrigated out, fulgurated any areas of concern and sprinkled Arista around.  I redrained it with 2 Penrose drains. Testicles are both viable.  There was some epididymal swelling bilaterally.  EBL: Minimal  Specimens: None  Indication: Matthew Ochoa. is a 77 y.o. patient with history of bilateral hydroceles status post hydrocelectomy.  Over the course of the last 5 days he had progressively worsening scrotal swelling and pain.  He also started on his Xarelto within 24 hours of his surgery, ill-advised.  After reviewing the management options for treatment, he elected to proceed with the above surgical procedure(s). We have discussed the potential benefits and risks of the procedure, side effects of the proposed treatment, the likelihood of the patient achieving the goals of the procedure, and any potential problems that might occur during the procedure or recuperation. Informed consent has been obtained.  Description of procedure:  The patient was taken to the operating room and general anesthesia was induced.  The patient was prepped and draped in the usual sterile fashion, and preoperative antibiotics were administered. A preoperative time-out was performed.   I reopened the incision at the scrotal raphae and entered into the patient's left hemiscrotum.  I reopened the suture line and through the dartos and down into the testicular pocket.  I pulled out the testicle and inspected.  It appeared to be viable.  The Jabaley a repair from his hydrocele did appear to be somewhat tight because of the edema.  Given this I opted to open up this area.  There was some hematoma underneath it.  There  was a little bit of hematoma around the testicle as well as some fluid that we drained.  I subsequently opened up the right hemiscrotum in a similar fashion.  The testicle was delivered.  I reopened the [facility] repair and left it open.  I irrigated out the hemiscrotum.  I evacuated some fluid as well as some hematoma.  I then placed Penrose drains in each hemiscrotum and the most dependent portion of the scrotum secured with 3-0 nylon.  I then irrigated out again and established hemostasis within each hemiscrotum.  I sprinkled Arista into both hemiscrotal pockets.  I closed the dartos layer again with 3-0 Vicryl in a running fashion bilaterally.  I then reapproximated the scrotal septum and closed the space between the 2 hemiscrotal incisions.  I then closed the subcutaneous layer with a 3-0 Vicryl.  I injected 20 cc of half percent plain Marcaine in and around the incision as well as around the drains.  I then closed the skin with a 4-0 Monocryl in a vertical mattress running suture.  I then applied 4 x 4 gauze to the area and unfurled a 4 x 4 Kerlix before applying a jockstrap.  We left his scrotum elevated.  At the end of the case I passed a 16 French red rubber catheter and emptied the patient's bladder of approximately 400 cc.  The patient was subsequently extubated and returned to the PACU in good condition.  Ardis Hughs, M.D.

## 2022-11-06 ENCOUNTER — Encounter (HOSPITAL_COMMUNITY): Payer: Self-pay | Admitting: Urology

## 2022-11-09 DIAGNOSIS — N433 Hydrocele, unspecified: Secondary | ICD-10-CM | POA: Diagnosis not present

## 2022-11-17 DIAGNOSIS — D649 Anemia, unspecified: Secondary | ICD-10-CM | POA: Diagnosis not present

## 2022-12-10 DIAGNOSIS — H25813 Combined forms of age-related cataract, bilateral: Secondary | ICD-10-CM | POA: Diagnosis not present

## 2022-12-10 DIAGNOSIS — H04123 Dry eye syndrome of bilateral lacrimal glands: Secondary | ICD-10-CM | POA: Diagnosis not present

## 2022-12-10 DIAGNOSIS — H40023 Open angle with borderline findings, high risk, bilateral: Secondary | ICD-10-CM | POA: Diagnosis not present

## 2022-12-10 DIAGNOSIS — E119 Type 2 diabetes mellitus without complications: Secondary | ICD-10-CM | POA: Diagnosis not present

## 2023-03-02 NOTE — Progress Notes (Signed)
HPI male former smoker followed for OSA, history PE/ Xarelto, COPD, complicated by allergic rhinitis, DM 2, A. Fib/amiodarone, glaucoma He had smoked one pack per day for 30 years, quitting in June of 2012. Office PFT 11/05/2002 had recorded FVC 3500 (74%) FEV1 2800 (75%) FEV1/FVC 0.80 with no response to bronchodilator. OSA- NPSG 03/17/98- AHI 32/hr; weight was 270 lbs. he has worn CPAP with variable long-term compliance and control. He says sometimes the mask is "bothersome" because of postnasal drainage and he is evasive about whether he has been using it recently. Pressure had been set at 14. PFT-08/09/2011-mild obstructive airways disease with insignificant response to bronchodilator, mild restriction, mild reduction of DLCO FEV1/FVC 0.65, DLCO 69%. TLC 71%. 6 minute walk test 08/09/2011-96% on room air to start, falling to 88% on room air by the end of his walk. Blood pressure rose to 170/110, pulse 112. After 2 minutes recovery oxygen saturation on room air at rest was 94%. He walked 420 m CT chest 07/01/12-Positive study for bilateral central and segmental pulmonary  emboli.  PFT 07/22/17-moderate obstructive airways disease, insignificant response -----------------------------------------------------------------------------------------------------  05/06/21- 77 year old male former smoker(60 pk yrs) followed for COPD, history PE/ Xarelto, OSA, complicated by allergic rhinitis, DM 2, ASCVD, A. Fib/ amiodarone, HBP, Glaucoma, -No inhalers CPAP auto 5-20/ [facility]PS-[employer] Download-compliance 83%, AHI 0.9/ hr Body weight today-274 lbs Covid vax-3 [employer] Reviewed download. CPAP is comfortable  but machine is old.  Not needing his inhaler- admits minimal occ dry cough w/o wheeze. No acute events.  03/04/23- 77 year old male former smoker(60 pk yrs) followed for COPD, history PE/ Xarelto, OSA, complicated by allergic rhinitis, DM 2, ASCVD, A. Fib/ amiodarone, HBP, Glaucoma, -No inhalers CPAP auto  5-20/ [facility] Download-compliance 83%, AHI 0.6/ hr Body weight today-273 lbs Download reviewed. Machine replaced this year. Takes it off if his nose runs.  ROS-see HPI   + = positive Constitutional:   No-   weight loss, night sweats, fevers, chills, fatigue, lassitude. HEENT:   No-  headaches, difficulty swallowing, tooth/dental problems, sore throat,       No-  sneezing, itching, ear ache, +nasal congestion, post nasal drip,  CV:  No-   chest pain, orthopnea, PND, swelling in lower extremities, anasarca, dizziness, palpitations Resp:  shortness of breath with exertion or at rest.              No-  productive cough,   +non-productive cough,  No- coughing up of blood.              No-   change in color of mucus.  No- wheezing.   Skin: No-   rash or lesions. GI:  No-   heartburn, indigestion, abdominal pain, nausea, vomiting,  GU: . MS:  No-   joint pain or swelling.  . Neuro-     nothing unusual Psych:  No- change in mood or affect. No depression or anxiety.  No memory loss.  OBJ- Physical Exam General- Alert, Oriented, Affect-appropriate, Distress- none acute, +obese   Skin- rash-none, lesions- none, excoriation- none Lymphadenopathy- none Head- atraumatic            Eyes- Gross vision intact, PERRLA, conjunctivae and secretions clear            Ears- Hearing, canals-normal            Nose- clear, no-Septal dev, mucus, polyps, erosion, perforation             Throat- Mallampati III , mucosa clear , drainage- none, tonsils-  atrophic,  + own teeth Neck- flexible , trachea midline, no stridor , thyroid nl, carotid no bruit Chest - symmetrical excursion , unlabored           Heart/CV- RR at this visit , no murmur , no gallop  , no rub, nl s1 s2                           - JVD- none , edema- none, stasis changes- none, varices- none           Lung- wheeze-none, cough- none , dullness-none, rub- none           Chest wall-  Abd-  Br/ Gen/ Rectal- Not done, not indicated Extrem-  cyanosis- none, clubbing, none, atrophy- none, strength- nl Neuro- grossly intact to observation

## 2023-03-04 ENCOUNTER — Ambulatory Visit (INDEPENDENT_AMBULATORY_CARE_PROVIDER_SITE_OTHER): Payer: Medicare Other | Admitting: Internal Medicine

## 2023-03-04 ENCOUNTER — Encounter: Payer: Self-pay | Admitting: Internal Medicine

## 2023-03-04 VITALS — BP 122/76 | HR 63 | Ht 71.0 in | Wt 273.0 lb

## 2023-03-04 DIAGNOSIS — G4733 Obstructive sleep apnea (adult) (pediatric): Secondary | ICD-10-CM

## 2023-03-04 DIAGNOSIS — I5032 Chronic diastolic (congestive) heart failure: Secondary | ICD-10-CM | POA: Diagnosis not present

## 2023-03-04 NOTE — Patient Instructions (Signed)
Glad you are doing well. We can continue CPAP auto 5-20.  Please call if we can help 

## 2023-04-01 DIAGNOSIS — I10 Essential (primary) hypertension: Secondary | ICD-10-CM | POA: Diagnosis not present

## 2023-04-01 DIAGNOSIS — E119 Type 2 diabetes mellitus without complications: Secondary | ICD-10-CM | POA: Diagnosis not present

## 2023-04-01 DIAGNOSIS — E785 Hyperlipidemia, unspecified: Secondary | ICD-10-CM | POA: Diagnosis not present

## 2023-04-17 ENCOUNTER — Encounter: Payer: Self-pay | Admitting: Internal Medicine

## 2023-04-17 NOTE — Assessment & Plan Note (Signed)
Continues under cardiology supervision. Denies acute events since last here.

## 2023-04-17 NOTE — Assessment & Plan Note (Signed)
Benefits from CPAP with satisfactory compliance and control Plan- discussed goals, continue auto 5-20

## 2023-05-03 ENCOUNTER — Encounter: Payer: Self-pay | Admitting: Internal Medicine

## 2023-05-19 DIAGNOSIS — Z23 Encounter for immunization: Secondary | ICD-10-CM | POA: Diagnosis not present

## 2023-05-30 ENCOUNTER — Other Ambulatory Visit: Payer: Self-pay | Admitting: Internal Medicine

## 2023-05-30 DIAGNOSIS — G4730 Sleep apnea, unspecified: Secondary | ICD-10-CM | POA: Diagnosis not present

## 2023-05-30 DIAGNOSIS — R0989 Other specified symptoms and signs involving the circulatory and respiratory systems: Secondary | ICD-10-CM | POA: Diagnosis not present

## 2023-05-30 DIAGNOSIS — Z0000 Encounter for general adult medical examination without abnormal findings: Secondary | ICD-10-CM | POA: Diagnosis not present

## 2023-05-30 DIAGNOSIS — E7800 Pure hypercholesterolemia, unspecified: Secondary | ICD-10-CM | POA: Diagnosis not present

## 2023-05-30 DIAGNOSIS — I509 Heart failure, unspecified: Secondary | ICD-10-CM | POA: Diagnosis not present

## 2023-05-30 DIAGNOSIS — D6869 Other thrombophilia: Secondary | ICD-10-CM | POA: Diagnosis not present

## 2023-05-30 DIAGNOSIS — Z1331 Encounter for screening for depression: Secondary | ICD-10-CM | POA: Diagnosis not present

## 2023-05-30 DIAGNOSIS — E1169 Type 2 diabetes mellitus with other specified complication: Secondary | ICD-10-CM | POA: Diagnosis not present

## 2023-05-30 DIAGNOSIS — I480 Paroxysmal atrial fibrillation: Secondary | ICD-10-CM | POA: Diagnosis not present

## 2023-05-30 DIAGNOSIS — I10 Essential (primary) hypertension: Secondary | ICD-10-CM | POA: Diagnosis not present

## 2023-05-30 DIAGNOSIS — I700 Atherosclerosis of aorta: Secondary | ICD-10-CM | POA: Diagnosis not present

## 2023-06-06 ENCOUNTER — Ambulatory Visit
Admission: RE | Admit: 2023-06-06 | Discharge: 2023-06-06 | Disposition: A | Discharge status: Home / Self Care | Payer: Medicare Other | Source: Ambulatory Visit | Attending: Internal Medicine | Admitting: Internal Medicine

## 2023-06-06 DIAGNOSIS — R0989 Other specified symptoms and signs involving the circulatory and respiratory systems: Secondary | ICD-10-CM | POA: Diagnosis not present

## 2023-06-15 DIAGNOSIS — H40023 Open angle with borderline findings, high risk, bilateral: Secondary | ICD-10-CM | POA: Diagnosis not present

## 2023-07-25 DIAGNOSIS — H52213 Irregular astigmatism, bilateral: Secondary | ICD-10-CM | POA: Diagnosis not present

## 2023-07-25 DIAGNOSIS — H25813 Combined forms of age-related cataract, bilateral: Secondary | ICD-10-CM | POA: Diagnosis not present

## 2023-07-25 DIAGNOSIS — H25811 Combined forms of age-related cataract, right eye: Secondary | ICD-10-CM | POA: Diagnosis not present

## 2023-08-05 ENCOUNTER — Ambulatory Visit: Payer: Medicare Other | Admitting: Internal Medicine

## 2023-09-09 DIAGNOSIS — E119 Type 2 diabetes mellitus without complications: Secondary | ICD-10-CM | POA: Diagnosis not present

## 2023-09-09 DIAGNOSIS — E782 Mixed hyperlipidemia: Secondary | ICD-10-CM | POA: Diagnosis not present

## 2023-09-09 DIAGNOSIS — I10 Essential (primary) hypertension: Secondary | ICD-10-CM | POA: Diagnosis not present

## 2023-09-09 DIAGNOSIS — I2699 Other pulmonary embolism without acute cor pulmonale: Secondary | ICD-10-CM | POA: Diagnosis not present

## 2023-09-27 DIAGNOSIS — H25813 Combined forms of age-related cataract, bilateral: Secondary | ICD-10-CM | POA: Diagnosis not present

## 2023-09-27 DIAGNOSIS — H268 Other specified cataract: Secondary | ICD-10-CM | POA: Diagnosis not present

## 2023-10-31 ENCOUNTER — Encounter: Payer: Self-pay | Admitting: Podiatry

## 2023-10-31 ENCOUNTER — Ambulatory Visit (INDEPENDENT_AMBULATORY_CARE_PROVIDER_SITE_OTHER): Payer: Medicare Other | Admitting: Podiatry

## 2023-10-31 DIAGNOSIS — M79675 Pain in left toe(s): Secondary | ICD-10-CM

## 2023-10-31 DIAGNOSIS — B351 Tinea unguium: Secondary | ICD-10-CM

## 2023-10-31 DIAGNOSIS — E1142 Type 2 diabetes mellitus with diabetic polyneuropathy: Secondary | ICD-10-CM

## 2023-10-31 DIAGNOSIS — M79674 Pain in right toe(s): Secondary | ICD-10-CM

## 2023-10-31 NOTE — Addendum Note (Signed)
 Addended by: Daryel November on: 10/31/2023 02:45 PM   Modules accepted: Orders

## 2023-10-31 NOTE — Progress Notes (Signed)
  Subjective:  Patient ID: Matthew Ochoa., male    DOB: Aug 05, 1946,   MRN: [id number]  No chief complaint on file.   78 y.o. male presents for concern of thickened elongated and painful nails that are difficult to trim. Requesting to have them trimmed today. Denies burning and tingling in their feet. Patient is diabetic and last A1c was  Lab Results  Component Value Date   HGBA1C 6.3 (H) 11/29/2018   .   PCP:  Renford Dills, MD    . Denies any other pedal complaints. Denies n/v/f/c.   Past Medical History:  Diagnosis Date   Abdominal pain, right upper quadrant    Allergic rhinitis    Arthritis    Asthma    as child   Atrial enlargement, left y-2   Benign localized hyperplasia of prostate with urinary obstruction and other lower urinary tract symptoms (LUTS)(600.21)    Calculus of gallbladder without mention of cholecystitis or obstruction    CHF (congestive heart failure) (HCC)    managed by Dr Sharyn Lull    COPD (chronic obstructive pulmonary disease) (HCC)    Diabetes mellitus    DVT (deep venous thrombosis) (HCC)    Hematuria, unspecified    HTN (hypertension)    Hyperlipidemia    Hypertension    Pulmonary embolism (HCC)    on xarelto    Sleep apnea    NPSG 03-17-98, cpap 14   Type 2 diabetes mellitus (HCC)     Objective:  Physical Exam: Vascular: DP/PT pulses 2/4 bilateral. CFT <3 seconds. Absent hair growth on digits. Edema noted to bilateral lower extremities. Xerosis noted bilaterally.  Skin. No lacerations or abrasions bilateral feet. Nails 1-5 bilateral  are thickened discolored and elongated with subungual debris.  Musculoskeletal: MMT 5/5 bilateral lower extremities in DF, PF, Inversion and Eversion. Deceased ROM in DF of ankle joint.  Neurological: Sensation intact to light touch. Protective sensation diminished bilateral.    Assessment:   1. Pain due to onychomycosis of toenails of both feet   2. Type 2 diabetes mellitus with peripheral neuropathy (HCC)       Plan:  Patient was evaluated and treated and all questions answered. -Discussed and educated patient on diabetic foot care, especially with  regards to the vascular, neurological and musculoskeletal systems.  -Stressed the importance of good glycemic control and the detriment of not  controlling glucose levels in relation to the foot. -Discussed supportive shoes at all times and checking feet regularly.  -Mechanically debrided all nails 1-5 bilateral using sterile nail nipper and filed with dremel without incident  -Answered all patient questions -Examined patient -Discussed treatment options for painful dystrophic nails  -Clinical picture and Fungal culture was obtained by removing a portion of the hard nail itself from each of the involved toenails using a sterile nail nipper and sent to [facility] lab. Patient tolerated the biopsy procedure well without discomfort or need for anesthesia.  -Discussed fungal nail treatment options including oral, topical, and laser treatments.  -Patient to return  in 3 months for at risk foot care -Patient advised to call the office if any problems or questions arise in the meantime.   Louann Sjogren, DPM

## 2023-11-03 DIAGNOSIS — R051 Acute cough: Secondary | ICD-10-CM | POA: Diagnosis not present

## 2023-11-03 DIAGNOSIS — J069 Acute upper respiratory infection, unspecified: Secondary | ICD-10-CM | POA: Diagnosis not present

## 2023-11-14 ENCOUNTER — Other Ambulatory Visit: Payer: Self-pay | Admitting: Podiatry

## 2023-11-28 DIAGNOSIS — E1169 Type 2 diabetes mellitus with other specified complication: Secondary | ICD-10-CM | POA: Diagnosis not present

## 2023-11-28 DIAGNOSIS — G4730 Sleep apnea, unspecified: Secondary | ICD-10-CM | POA: Diagnosis not present

## 2023-11-28 DIAGNOSIS — I509 Heart failure, unspecified: Secondary | ICD-10-CM | POA: Diagnosis not present

## 2023-11-28 DIAGNOSIS — I480 Paroxysmal atrial fibrillation: Secondary | ICD-10-CM | POA: Diagnosis not present

## 2023-11-28 DIAGNOSIS — I10 Essential (primary) hypertension: Secondary | ICD-10-CM | POA: Diagnosis not present

## 2023-11-28 DIAGNOSIS — E7800 Pure hypercholesterolemia, unspecified: Secondary | ICD-10-CM | POA: Diagnosis not present

## 2023-11-28 DIAGNOSIS — D6869 Other thrombophilia: Secondary | ICD-10-CM | POA: Diagnosis not present

## 2023-11-28 DIAGNOSIS — I700 Atherosclerosis of aorta: Secondary | ICD-10-CM | POA: Diagnosis not present

## 2024-01-03 DIAGNOSIS — H268 Other specified cataract: Secondary | ICD-10-CM | POA: Diagnosis not present

## 2024-01-03 DIAGNOSIS — H25812 Combined forms of age-related cataract, left eye: Secondary | ICD-10-CM | POA: Diagnosis not present

## 2024-01-03 DIAGNOSIS — H2512 Age-related nuclear cataract, left eye: Secondary | ICD-10-CM | POA: Diagnosis not present

## 2024-01-16 ENCOUNTER — Ambulatory Visit (INDEPENDENT_AMBULATORY_CARE_PROVIDER_SITE_OTHER): Payer: Medicare Other | Admitting: Podiatry

## 2024-01-16 DIAGNOSIS — M79675 Pain in left toe(s): Secondary | ICD-10-CM

## 2024-01-16 DIAGNOSIS — M79674 Pain in right toe(s): Secondary | ICD-10-CM | POA: Diagnosis not present

## 2024-01-16 DIAGNOSIS — E1142 Type 2 diabetes mellitus with diabetic polyneuropathy: Secondary | ICD-10-CM

## 2024-01-16 DIAGNOSIS — B351 Tinea unguium: Secondary | ICD-10-CM

## 2024-01-16 MED ORDER — CICLOPIROX 8 % EX SOLN: Freq: Every day | CUTANEOUS | 0 refills | Status: DC

## 2024-01-16 NOTE — Progress Notes (Signed)
  Subjective:  Patient ID: Matthew Karvonen., male    DOB: Aug 21, 1946,   MRN: [id number]  No chief complaint on file.   78 y.o. male presents for concern of thickened elongated and painful nails that are difficult to trim. Requesting to have them trimmed today. Denies burning and tingling in their feet. Patient is diabetic and last A1c was  Lab Results  Component Value Date   HGBA1C 6.3 (H) 11/29/2018   .   PCP:  Merl Star, MD    . Denies any other pedal complaints. Denies n/v/f/c.   Past Medical History:  Diagnosis Date   Abdominal pain, right upper quadrant    Allergic rhinitis    Arthritis    Asthma    as child   Atrial enlargement, left y-2   Benign localized hyperplasia of prostate with urinary obstruction and other lower urinary tract symptoms (LUTS)(600.21)    Calculus of gallbladder without mention of cholecystitis or obstruction    CHF (congestive heart failure) (HCC)    managed by Dr Glena Landau    COPD (chronic obstructive pulmonary disease) (HCC)    Diabetes mellitus    DVT (deep venous thrombosis) (HCC)    Hematuria, unspecified    HTN (hypertension)    Hyperlipidemia    Hypertension    Pulmonary embolism (HCC)    on xarelto     Sleep apnea    NPSG 03-17-98, cpap 14   Type 2 diabetes mellitus (HCC)     Objective:  Physical Exam: Vascular: DP/PT pulses 2/4 bilateral. CFT <3 seconds. Absent hair growth on digits. Edema noted to bilateral lower extremities. Xerosis noted bilaterally.  Skin. No lacerations or abrasions bilateral feet. Nails 1-5 bilateral  are thickened discolored and elongated with subungual debris.  Musculoskeletal: MMT 5/5 bilateral lower extremities in DF, PF, Inversion and Eversion. Deceased ROM in DF of ankle joint.  Neurological: Sensation intact to light touch. Protective sensation diminished bilateral.    Assessment:   1. Pain due to onychomycosis of toenails of both feet   2. Type 2 diabetes mellitus with peripheral neuropathy (HCC)        Plan:  Patient was evaluated and treated and all questions answered. -Discussed and educated patient on diabetic foot care, especially with  regards to the vascular, neurological and musculoskeletal systems.  -Stressed the importance of good glycemic control and the detriment of not  controlling glucose levels in relation to the foot. -Discussed supportive shoes at all times and checking feet regularly.  -Mechanically debrided all nails 1-5 bilateral using sterile nail nipper and filed with dremel without incident  -Answered all patient questions -Examined patient -Discussed treatment options for painful dystrophic nails  -Culture positive for fungus.  -Discussed fungal nail treatment options including oral, topical, and laser treatments.  -Would like to try penalc. Sent to pharmacy.  -Patient to return  in 3 months for at risk foot care -Patient advised to call the office if any problems or questions arise in the meantime.   Jennefer Moats, DPM

## 2024-01-27 DIAGNOSIS — E785 Hyperlipidemia, unspecified: Secondary | ICD-10-CM | POA: Diagnosis not present

## 2024-01-27 DIAGNOSIS — Z8679 Personal history of other diseases of the circulatory system: Secondary | ICD-10-CM | POA: Diagnosis not present

## 2024-01-27 DIAGNOSIS — I10 Essential (primary) hypertension: Secondary | ICD-10-CM | POA: Diagnosis not present

## 2024-01-27 DIAGNOSIS — E119 Type 2 diabetes mellitus without complications: Secondary | ICD-10-CM | POA: Diagnosis not present

## 2024-01-27 DIAGNOSIS — I2699 Other pulmonary embolism without acute cor pulmonale: Secondary | ICD-10-CM | POA: Diagnosis not present

## 2024-02-10 ENCOUNTER — Other Ambulatory Visit: Payer: Self-pay | Admitting: Podiatry

## 2024-03-02 NOTE — Progress Notes (Deleted)
 HPI male former smoker followed for OSA, history PE/ Xarelto , COPD, complicated by allergic rhinitis, DM 2, A. Fib/amiodarone , glaucoma He had smoked one pack per day for 30 years, quitting in June of 2012. Office PFT 11/05/2002 had recorded FVC 3500 (74%) FEV1 2800 (75%) FEV1/FVC 0.80 with no response to bronchodilator. OSA- NPSG 03/17/98- AHI 32/hr; weight was 270 lbs. he has worn CPAP with variable long-term compliance and control. He says sometimes the mask is bothersome because of postnasal drainage and he is evasive about whether he has been using it recently. Pressure had been set at 14. PFT-08/09/2011-mild obstructive airways disease with insignificant response to bronchodilator, mild restriction, mild reduction of DLCO FEV1/FVC 0.65, DLCO 69%. TLC 71%. 6 minute walk test 08/09/2011-96% on room air to start, falling to 88% on room air by the end of his walk. Blood pressure rose to 170/110, pulse 112. After 2 minutes recovery oxygen saturation on room air at rest was 94%. He walked 420 m CT chest 07/01/12-Positive study for bilateral central and segmental pulmonary  emboli.  PFT 07/22/17-moderate obstructive airways disease, insignificant response -----------------------------------------------------------------------------------------------------   03/04/23- 78 year old male former smoker(60 pk yrs) followed for COPD, history PE/ Xarelto , OSA, complicated by allergic rhinitis, DM 2, ASCVD, A. Fib/ amiodarone , HBP, Glaucoma, -No inhalers CPAP auto 5-20/ [facility] Download-compliance 83%, AHI 0.6/ hr Body weight today-273 lbs Download reviewed. Machine replaced this year. Takes it off if his nose runs.  03/05/24- 78 year old male former smoker(60 pk yrs) followed for COPD, history PE/ Xarelto , OSA, complicated by allergic rhinitis, DM 2, ASCVD, A. Fib/ amiodarone , HBP, Glaucoma, -No inhalers CPAP auto 5-20/ [facility] Download-compliance  Body weight today-   ROS-see HPI   + =  positive Constitutional:   No-   weight loss, night sweats, fevers, chills, fatigue, lassitude. HEENT:   No-  headaches, difficulty swallowing, tooth/dental problems, sore throat,       No-  sneezing, itching, ear ache, +nasal congestion, post nasal drip,  CV:  No-   chest pain, orthopnea, PND, swelling in lower extremities, anasarca, dizziness, palpitations Resp:  shortness of breath with exertion or at rest.              No-  productive cough,   +non-productive cough,  No- coughing up of blood.              No-   change in color of mucus.  No- wheezing.   Skin: No-   rash or lesions. GI:  No-   heartburn, indigestion, abdominal pain, nausea, vomiting,  GU: . MS:  No-   joint pain or swelling.  . Neuro-     nothing unusual Psych:  No- change in mood or affect. No depression or anxiety.  No memory loss.  OBJ- Physical Exam General- Alert, Oriented, Affect-appropriate, Distress- none acute, +obese   Skin- rash-none, lesions- none, excoriation- none Lymphadenopathy- none Head- atraumatic            Eyes- Gross vision intact, PERRLA, conjunctivae and secretions clear            Ears- Hearing, canals-normal            Nose- clear, no-Septal dev, mucus, polyps, erosion, perforation             Throat- Mallampati III , mucosa clear , drainage- none, tonsils- atrophic,  + own teeth Neck- flexible , trachea midline, no stridor , thyroid  nl, carotid no bruit Chest - symmetrical excursion , unlabored  Heart/CV- RR at this visit , no murmur , no gallop  , no rub, nl s1 s2                           - JVD- none , edema- none, stasis changes- none, varices- none           Lung- wheeze-none, cough- none , dullness-none, rub- none           Chest wall-  Abd-  Br/ Gen/ Rectal- Not done, not indicated Extrem- cyanosis- none, clubbing, none, atrophy- none, strength- nl Neuro- grossly intact to observation

## 2024-03-05 ENCOUNTER — Encounter: Payer: Self-pay | Admitting: Internal Medicine

## 2024-03-05 ENCOUNTER — Ambulatory Visit: Payer: Medicare Other | Admitting: Internal Medicine

## 2024-03-23 ENCOUNTER — Encounter: Payer: Self-pay | Admitting: Advanced Practice Midwife

## 2024-04-17 ENCOUNTER — Ambulatory Visit (INDEPENDENT_AMBULATORY_CARE_PROVIDER_SITE_OTHER): Admitting: Podiatry

## 2024-04-17 ENCOUNTER — Ambulatory Visit: Admitting: Podiatry

## 2024-04-17 ENCOUNTER — Encounter: Payer: Self-pay | Admitting: Podiatry

## 2024-04-17 DIAGNOSIS — F4321 Adjustment disorder with depressed mood: Secondary | ICD-10-CM | POA: Insufficient documentation

## 2024-04-17 DIAGNOSIS — M79675 Pain in left toe(s): Secondary | ICD-10-CM | POA: Diagnosis not present

## 2024-04-17 DIAGNOSIS — I509 Heart failure, unspecified: Secondary | ICD-10-CM | POA: Insufficient documentation

## 2024-04-17 DIAGNOSIS — B351 Tinea unguium: Secondary | ICD-10-CM

## 2024-04-17 DIAGNOSIS — E7800 Pure hypercholesterolemia, unspecified: Secondary | ICD-10-CM | POA: Insufficient documentation

## 2024-04-17 DIAGNOSIS — R269 Unspecified abnormalities of gait and mobility: Secondary | ICD-10-CM | POA: Insufficient documentation

## 2024-04-17 DIAGNOSIS — M79674 Pain in right toe(s): Secondary | ICD-10-CM | POA: Diagnosis not present

## 2024-04-17 DIAGNOSIS — E663 Overweight: Secondary | ICD-10-CM | POA: Insufficient documentation

## 2024-04-17 DIAGNOSIS — E1142 Type 2 diabetes mellitus with diabetic polyneuropathy: Secondary | ICD-10-CM | POA: Diagnosis not present

## 2024-04-17 DIAGNOSIS — N400 Enlarged prostate without lower urinary tract symptoms: Secondary | ICD-10-CM | POA: Insufficient documentation

## 2024-04-17 DIAGNOSIS — I700 Atherosclerosis of aorta: Secondary | ICD-10-CM | POA: Insufficient documentation

## 2024-04-17 MED ORDER — CICLOPIROX 8 % EX SOLN: Freq: Every day | CUTANEOUS | 11 refills | Status: DC

## 2024-04-18 NOTE — Progress Notes (Signed)
 He presents today chief complaint of painful elongated toenails.  He has been using Penlac  which was prescribed by Dr. Sikora on a daily basis.  He has not been taking the Penlac  weekly.  Objective: Vital signs are stable alert oriented x 3.  Toenails are thick dark and dystrophic with large amount of Penlac  adhered to them as well as debris.  Assessment: Pain limb secondary to onychomycosis long-term therapy with Penlac .  Plan: Debrided nails 1 through 5 bilaterally today and represcribed his Penlac  for him.

## 2024-05-21 NOTE — Progress Notes (Unsigned)
 HPI male former smoker followed for OSA, history PE/ Xarelto , COPD, complicated by allergic rhinitis, DM 2, A. Fib/amiodarone , glaucoma He had smoked one pack per day for 30 years, quitting in June of 2012. Office PFT 11/05/2002 had recorded FVC 3500 (74%) FEV1 2800 (75%) FEV1/FVC 0.80 with no response to bronchodilator. OSA- NPSG 03/17/98- AHI 32/hr; weight was 270 lbs. he has worn CPAP with variable long-term compliance and control. He says sometimes the mask is bothersome because of postnasal drainage and he is evasive about whether he has been using it recently. Pressure had been set at 14. PFT-08/09/2011-mild obstructive airways disease with insignificant response to bronchodilator, mild restriction, mild reduction of DLCO FEV1/FVC 0.65, DLCO 69%. TLC 71%. 6 minute walk test 08/09/2011-96% on room air to start, falling to 88% on room air by the end of his walk. Blood pressure rose to 170/110, pulse 112. After 2 minutes recovery oxygen saturation on room air at rest was 94%. He walked 420 m CT chest 07/01/12-Positive study for bilateral central and segmental pulmonary  emboli.  PFT 07/22/17-moderate obstructive airways disease, insignificant response -----------------------------------------------------------------------------------------------------   03/04/23- 78 year old male former smoker(60 pk yrs) followed for COPD, history PE/ Xarelto , OSA, complicated by allergic rhinitis, DM 2, ASCVD, A. Fib/ amiodarone , HBP, Glaucoma, -No inhalers CPAP auto 5-20/ [facility] Download-compliance 83%, AHI 0.6/ hr Body weight today-273 lbs Download reviewed. Machine replaced this year. Takes it off if his nose runs.  05/22/24-  78 year old male former smoker(60 pk yrs) followed for COPD, history PE/ Xarelto , OSA, complicated by allergic rhinitis, DM 2, ASCVD, A. Fib/ amiodarone , HBP, Glaucoma, -No inhalers CPAP auto 5-20/ [facility] Download-compliance 47%, AHI 0.9/hr Body weight today-250  lbs Discussed the use of AI scribe software for clinical note transcription with the patient, who gave verbal consent to proceed.  History of Present Illness   Matthew Ochoa. is a 78 year old male with sleep apnea who presents for evaluation of CPAP usage and effectiveness.  He uses a CPAP machine since last year and feels comfortable with it. He often has short nights where he does not wear the CPAP for at least four hours, frequently due to forgetting to put it back on after using the bathroom. When used, the CPAP records less than one breakthrough event per hour. He does not notice a difference in how he feels on days when he does not wear the CPAP or wears it less. His daytime breathing seems adequate, and there are no significant changes in weight that could affect the need for CPAP. He asks if he still needs CPAP and we are going to update HST.     Assessment and Plan:    Obstructive sleep apnea CPAP effective when used, but usage inconsistent. Potential for discontinuation if no longer needed. - Order home sleep study without CPAP to assess current need. - Instruct on proper CPAP use, emphasizing consistency. - Discuss potential outcomes of sleep study, including continued CPAP use or alternatives.     ROS-see HPI   + = positive Constitutional:   No-   weight loss, night sweats, fevers, chills, fatigue, lassitude. HEENT:   No-  headaches, difficulty swallowing, tooth/dental problems, sore throat,       No-  sneezing, itching, ear ache, +nasal congestion, post nasal drip,  CV:  No-   chest pain, orthopnea, PND, swelling in lower extremities, anasarca, dizziness, palpitations Resp:  shortness of breath with exertion or at rest.  No-  productive cough,   +non-productive cough,  No- coughing up of blood.              No-   change in color of mucus.  No- wheezing.   Skin: No-   rash or lesions. GI:  No-   heartburn, indigestion, abdominal pain, nausea, vomiting,  GU: . MS:   No-   joint pain or swelling.  . Neuro-     nothing unusual Psych:  No- change in mood or affect. No depression or anxiety.  No memory loss.  OBJ- Physical Exam General- Alert, Oriented, Affect-appropriate, Distress- none acute, +obese   Skin- rash-none, lesions- none, excoriation- none Lymphadenopathy- none Head- atraumatic            Eyes- Gross vision intact, PERRLA, conjunctivae and secretions clear            Ears- Hearing, canals-normal            Nose- clear, no-Septal dev, mucus, polyps, erosion, perforation             Throat- Mallampati III , mucosa clear , drainage- none, tonsils- atrophic,  + own teeth Neck- flexible , trachea midline, no stridor , thyroid  nl, carotid no bruit Chest - symmetrical excursion , unlabored           Heart/CV- RR at this visit , no murmur , no gallop  , no rub, nl s1 s2                           - JVD- none , edema- none, stasis changes- none, varices- none           Lung- wheeze-none, cough- none , dullness-none, rub- none           Chest wall-  Abd-  Br/ Gen/ Rectal- Not done, not indicated Extrem- cyanosis- none, clubbing, none, atrophy- none, strength- nl Neuro- grossly intact to observation

## 2024-05-22 ENCOUNTER — Ambulatory Visit (INDEPENDENT_AMBULATORY_CARE_PROVIDER_SITE_OTHER): Admitting: Internal Medicine

## 2024-05-22 ENCOUNTER — Encounter: Payer: Self-pay | Admitting: Internal Medicine

## 2024-05-22 VITALS — BP 126/78 | HR 69 | Temp 98.0°F | Ht 71.0 in | Wt 250.0 lb

## 2024-05-22 DIAGNOSIS — G4733 Obstructive sleep apnea (adult) (pediatric): Secondary | ICD-10-CM | POA: Diagnosis not present

## 2024-05-22 DIAGNOSIS — Z87891 Personal history of nicotine dependence: Secondary | ICD-10-CM

## 2024-05-22 NOTE — Patient Instructions (Signed)
 Order- schedule home sleep test (off CPAP)   dx OSA  Please call us  about 2 weeks after your sleep test for results and recommendations.  We can use the results of this sleep test to decide if you still need treatment for sleep apnea and whether CPAP is still the best choice.

## 2024-05-25 ENCOUNTER — Encounter: Payer: Self-pay | Admitting: Internal Medicine

## 2024-06-01 DIAGNOSIS — I700 Atherosclerosis of aorta: Secondary | ICD-10-CM | POA: Diagnosis not present

## 2024-06-01 DIAGNOSIS — I10 Essential (primary) hypertension: Secondary | ICD-10-CM | POA: Diagnosis not present

## 2024-06-01 DIAGNOSIS — Z23 Encounter for immunization: Secondary | ICD-10-CM | POA: Diagnosis not present

## 2024-06-01 DIAGNOSIS — E7800 Pure hypercholesterolemia, unspecified: Secondary | ICD-10-CM | POA: Diagnosis not present

## 2024-06-01 DIAGNOSIS — I480 Paroxysmal atrial fibrillation: Secondary | ICD-10-CM | POA: Diagnosis not present

## 2024-06-01 DIAGNOSIS — I509 Heart failure, unspecified: Secondary | ICD-10-CM | POA: Diagnosis not present

## 2024-06-01 DIAGNOSIS — R0989 Other specified symptoms and signs involving the circulatory and respiratory systems: Secondary | ICD-10-CM | POA: Diagnosis not present

## 2024-06-01 DIAGNOSIS — Z1331 Encounter for screening for depression: Secondary | ICD-10-CM | POA: Diagnosis not present

## 2024-06-01 DIAGNOSIS — D6869 Other thrombophilia: Secondary | ICD-10-CM | POA: Diagnosis not present

## 2024-06-01 DIAGNOSIS — Z0000 Encounter for general adult medical examination without abnormal findings: Secondary | ICD-10-CM | POA: Diagnosis not present

## 2024-06-01 DIAGNOSIS — G4730 Sleep apnea, unspecified: Secondary | ICD-10-CM | POA: Diagnosis not present

## 2024-06-01 DIAGNOSIS — E1169 Type 2 diabetes mellitus with other specified complication: Secondary | ICD-10-CM | POA: Diagnosis not present

## 2024-06-15 DIAGNOSIS — Z23 Encounter for immunization: Secondary | ICD-10-CM | POA: Diagnosis not present

## 2024-07-18 ENCOUNTER — Encounter: Payer: Self-pay | Admitting: Podiatry

## 2024-07-18 ENCOUNTER — Ambulatory Visit (INDEPENDENT_AMBULATORY_CARE_PROVIDER_SITE_OTHER): Admitting: Podiatry

## 2024-07-18 DIAGNOSIS — M79674 Pain in right toe(s): Secondary | ICD-10-CM | POA: Diagnosis not present

## 2024-07-18 DIAGNOSIS — B351 Tinea unguium: Secondary | ICD-10-CM | POA: Diagnosis not present

## 2024-07-18 DIAGNOSIS — M79675 Pain in left toe(s): Secondary | ICD-10-CM

## 2024-07-18 DIAGNOSIS — E1142 Type 2 diabetes mellitus with diabetic polyneuropathy: Secondary | ICD-10-CM

## 2024-07-18 MED ORDER — CICLOPIROX 8 % EX SOLN: Freq: Every day | CUTANEOUS | 11 refills | Status: AC

## 2024-07-18 NOTE — Progress Notes (Signed)
  Subjective:  Patient ID: Matthew Ochoa., male    DOB: 08/31/1946,   MRN: [id number]  Chief Complaint  Patient presents with   Diabetes    I got fungus on my feet.  I need a refill of the medication.  My friend cut my toenails for me.  I can't wait that long.  Saw Dr. Tanda Polite - 06/01/2024; A1c - ?    78 y.o. male presents for concern of thickened elongated and painful nails that are difficult to trim. Requesting to have them trimmed today. Denies burning and tingling in their feet. Patient is diabetic and last A1c was  Lab Results  Component Value Date   HGBA1C 6.3 (H) 11/29/2018   .   PCP:  Rexanne Tanda, MD    . Denies any other pedal complaints. Denies n/v/f/c.   Past Medical History:  Diagnosis Date   Abdominal pain, right upper quadrant    Allergic rhinitis    Arthritis    Asthma    as child   Atrial enlargement, left y-2   Benign localized hyperplasia of prostate with urinary obstruction and other lower urinary tract symptoms (LUTS)(600.21)    Calculus of gallbladder without mention of cholecystitis or obstruction    CHF (congestive heart failure) (HCC)    managed by Dr Levern    COPD (chronic obstructive pulmonary disease) (HCC)    Diabetes mellitus    DVT (deep venous thrombosis) (HCC)    Hematuria, unspecified    HTN (hypertension)    Hyperlipidemia    Hypertension    Pulmonary embolism (HCC)    on xarelto     Sleep apnea    NPSG 03-17-98, cpap 14   Type 2 diabetes mellitus (HCC)     Objective:  Physical Exam: Vascular: DP/PT pulses 2/4 bilateral. CFT <3 seconds. Absent hair growth on digits. Edema noted to bilateral lower extremities. Xerosis noted bilaterally.  Skin. No lacerations or abrasions bilateral feet. Nails 1-5 bilateral  are thickened discolored and elongated with subungual debris.  Musculoskeletal: MMT 5/5 bilateral lower extremities in DF, PF, Inversion and Eversion. Deceased ROM in DF of ankle joint.  Neurological: Sensation intact  to light touch. Protective sensation diminished bilateral.    Assessment:   1. Pain due to onychomycosis of toenails of both feet   2. Type 2 diabetes mellitus with peripheral neuropathy (HCC)       Plan:  Patient was evaluated and treated and all questions answered. -Discussed and educated patient on diabetic foot care, especially with  regards to the vascular, neurological and musculoskeletal systems.  -Stressed the importance of good glycemic control and the detriment of not  controlling glucose levels in relation to the foot. -Discussed supportive shoes at all times and checking feet regularly.  -Mechanically debrided all nails 1-5 bilateral using sterile nail nipper and filed with dremel without incident  -Answered all patient questions -Examined patient -Discussed treatment options for painful dystrophic nails  -Culture positive for fungus.  -Discussed fungal nail treatment options including oral, topical, and laser treatments.  - Refill sent to pharmacy of penlac .  -Patient to return  in 3 months for at risk foot care -Patient advised to call the office if any problems or questions arise in the meantime.   Asberry Failing, DPM

## 2024-10-17 ENCOUNTER — Ambulatory Visit: Admitting: Podiatry
